# Patient Record
Sex: Male | Born: 1955 | Race: White | Hispanic: No | Marital: Married | Smoking: Former smoker
Health system: Southern US, Community
[De-identification: ages and names within clinical notes are randomized; demographics above are authoritative.]

## PROBLEM LIST (undated history)

## (undated) DIAGNOSIS — L511 Stevens-Johnson syndrome: Secondary | ICD-10-CM

## (undated) DIAGNOSIS — E785 Hyperlipidemia, unspecified: Secondary | ICD-10-CM

## (undated) DIAGNOSIS — E2740 Unspecified adrenocortical insufficiency: Secondary | ICD-10-CM

## (undated) DIAGNOSIS — E663 Overweight: Secondary | ICD-10-CM

## (undated) DIAGNOSIS — K754 Autoimmune hepatitis: Secondary | ICD-10-CM

## (undated) DIAGNOSIS — Z8249 Family history of ischemic heart disease and other diseases of the circulatory system: Secondary | ICD-10-CM

## (undated) DIAGNOSIS — D68312 Antiphospholipid antibody with hemorrhagic disorder: Secondary | ICD-10-CM

## (undated) DIAGNOSIS — K8309 Other cholangitis: Secondary | ICD-10-CM

## (undated) DIAGNOSIS — R6521 Severe sepsis with septic shock: Secondary | ICD-10-CM

## (undated) DIAGNOSIS — I10 Essential (primary) hypertension: Secondary | ICD-10-CM

## (undated) DIAGNOSIS — D704 Cyclic neutropenia: Secondary | ICD-10-CM

## (undated) DIAGNOSIS — K219 Gastro-esophageal reflux disease without esophagitis: Secondary | ICD-10-CM

## (undated) DIAGNOSIS — N529 Male erectile dysfunction, unspecified: Secondary | ICD-10-CM

## (undated) DIAGNOSIS — L0390 Cellulitis, unspecified: Secondary | ICD-10-CM

## (undated) DIAGNOSIS — K8301 Primary sclerosing cholangitis: Secondary | ICD-10-CM

## (undated) DIAGNOSIS — I639 Cerebral infarction, unspecified: Secondary | ICD-10-CM

## (undated) DIAGNOSIS — Z8719 Personal history of other diseases of the digestive system: Secondary | ICD-10-CM

## (undated) DIAGNOSIS — E119 Type 2 diabetes mellitus without complications: Secondary | ICD-10-CM

## (undated) DIAGNOSIS — A4151 Sepsis due to Escherichia coli [E. coli]: Secondary | ICD-10-CM

## (undated) DIAGNOSIS — I823 Embolism and thrombosis of renal vein: Secondary | ICD-10-CM

## (undated) DIAGNOSIS — D6861 Antiphospholipid syndrome: Secondary | ICD-10-CM

## (undated) DIAGNOSIS — E2749 Other adrenocortical insufficiency: Secondary | ICD-10-CM

## (undated) HISTORY — DX: Primary sclerosing cholangitis: K83.01

## (undated) HISTORY — DX: Family history of ischemic heart disease and other diseases of the circulatory system: Z82.49

## (undated) HISTORY — DX: Embolism and thrombosis of renal vein: I82.3

## (undated) HISTORY — DX: Cyclic neutropenia: D70.4

## (undated) HISTORY — DX: Gastro-esophageal reflux disease without esophagitis: K21.9

## (undated) HISTORY — DX: Male erectile dysfunction, unspecified: N52.9

## (undated) HISTORY — PX: ROTATOR CUFF REPAIR: SHX139

## (undated) HISTORY — DX: Unspecified adrenocortical insufficiency: E27.40

## (undated) HISTORY — DX: Essential (primary) hypertension: I10

## (undated) HISTORY — DX: Type 2 diabetes mellitus without complications: E11.9

## (undated) HISTORY — DX: Antiphospholipid antibody with hemorrhagic disorder: D68.312

## (undated) HISTORY — DX: Other adrenocortical insufficiency: E27.49

## (undated) HISTORY — DX: Autoimmune hepatitis: K75.4

## (undated) HISTORY — DX: Personal history of other diseases of the digestive system: Z87.19

## (undated) HISTORY — DX: Cerebral infarction, unspecified: I63.9

## (undated) HISTORY — DX: Severe sepsis with septic shock: A41.51

## (undated) HISTORY — DX: Severe sepsis with septic shock: R65.21

## (undated) HISTORY — PX: ERCP W/ METAL STENT PLACEMENT: SHX1521

## (undated) HISTORY — DX: Other cholangitis: K83.09

## (undated) HISTORY — DX: Antiphospholipid syndrome: D68.61

## (undated) HISTORY — DX: Hyperlipidemia, unspecified: E78.5

## (undated) HISTORY — DX: Overweight: E66.3

---
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
## 2005-05-10 ENCOUNTER — Ambulatory Visit: Payer: Self-pay | Admitting: Family Medicine

## 2005-05-14 ENCOUNTER — Ambulatory Visit (HOSPITAL_COMMUNITY): Admission: RE | Admit: 2005-05-14 | Discharge: 2005-05-14 | Payer: Self-pay | Admitting: Family Medicine

## 2005-06-13 ENCOUNTER — Ambulatory Visit (HOSPITAL_COMMUNITY): Admission: RE | Admit: 2005-06-13 | Discharge: 2005-06-13 | Payer: Self-pay | Admitting: Family Medicine

## 2005-08-23 ENCOUNTER — Ambulatory Visit: Payer: Self-pay | Admitting: Family Medicine

## 2005-08-28 ENCOUNTER — Ambulatory Visit: Payer: Self-pay | Admitting: Oncology

## 2005-09-04 LAB — CBC WITH DIFFERENTIAL (CANCER CENTER ONLY)
BASO#: 0 10*3/uL (ref 0.0–0.2)
Eosinophils Absolute: 0.1 10*3/uL (ref 0.0–0.5)
HCT: 40.7 % (ref 38.7–49.9)
HGB: 14.2 g/dL (ref 13.0–17.1)
LYMPH%: 60.2 % — ABNORMAL HIGH (ref 14.0–48.0)
MCH: 30.2 pg (ref 28.0–33.4)
MONO#: 0.3 10*3/uL (ref 0.1–0.9)
NEUT#: 0.9 10*3/uL — ABNORMAL LOW (ref 1.5–6.5)
NEUT%: 26.8 % — ABNORMAL LOW (ref 40.0–80.0)
RDW: 12.1 % (ref 10.5–14.6)

## 2005-09-04 LAB — URIC ACID: Uric Acid, Serum: 7.3 mg/dL — ABNORMAL HIGH (ref 2.4–7.0)

## 2005-09-04 LAB — MORPHOLOGY - CHCC SATELLITE: PLT EST ~~LOC~~: ADEQUATE

## 2005-09-04 LAB — COMPREHENSIVE METABOLIC PANEL
Albumin: 4 g/dL (ref 3.5–5.2)
BUN: 11 mg/dL (ref 6–23)
CO2: 29 mEq/L (ref 19–32)
Glucose, Bld: 105 mg/dL — ABNORMAL HIGH (ref 70–99)
Sodium: 136 mEq/L (ref 135–145)
Total Bilirubin: 1.1 mg/dL (ref 0.3–1.2)
Total Protein: 6.6 g/dL (ref 6.0–8.3)

## 2005-09-04 LAB — LACTATE DEHYDROGENASE: LDH: 153 U/L (ref 94–250)

## 2005-09-05 ENCOUNTER — Other Ambulatory Visit: Admission: RE | Admit: 2005-09-05 | Discharge: 2005-09-05 | Payer: Self-pay | Admitting: Oncology

## 2005-09-14 LAB — HIV ANTIBODY (ROUTINE TESTING W REFLEX)

## 2005-09-14 LAB — HAPTOGLOBIN: Haptoglobin: 180 mg/dL (ref 16–200)

## 2005-09-14 LAB — IGG, IGA, IGM
IgA: 97 mg/dL (ref 68–378)
IgG (Immunoglobin G), Serum: 1180 mg/dL (ref 694–1618)
IgM, Serum: 65 mg/dL (ref 60–263)

## 2005-09-14 LAB — FLOW CYTOMETRY - CHCC SATELLITE

## 2005-09-28 LAB — CBC WITH DIFFERENTIAL (CANCER CENTER ONLY)
BASO#: 0 10*3/uL (ref 0.0–0.2)
BASO%: 0.9 % (ref 0.0–2.0)
HGB: 13.8 g/dL (ref 13.0–17.1)
MCH: 29.5 pg (ref 28.0–33.4)
MCV: 87 fL (ref 82–98)
MONO%: 8 % (ref 0.0–13.0)
NEUT#: 1.3 10*3/uL — ABNORMAL LOW (ref 1.5–6.5)
NEUT%: 32.9 % — ABNORMAL LOW (ref 40.0–80.0)
Platelets: 220 10*3/uL (ref 145–400)
RBC: 4.68 10*6/uL (ref 4.20–5.70)
RDW: 12 % (ref 10.5–14.6)
WBC: 3.9 10*3/uL — ABNORMAL LOW (ref 4.0–10.0)

## 2005-10-23 ENCOUNTER — Ambulatory Visit: Payer: Self-pay | Admitting: Oncology

## 2005-10-24 LAB — CBC WITH DIFFERENTIAL (CANCER CENTER ONLY)
BASO%: 1.1 % (ref 0.0–2.0)
EOS%: 5.2 % (ref 0.0–7.0)
Eosinophils Absolute: 0.2 10*3/uL (ref 0.0–0.5)
LYMPH%: 56.9 % — ABNORMAL HIGH (ref 14.0–48.0)
MCHC: 33.6 g/dL (ref 32.0–35.9)
MONO%: 6.2 % (ref 0.0–13.0)
NEUT%: 30.6 % — ABNORMAL LOW (ref 40.0–80.0)
Platelets: 228 10*3/uL (ref 145–400)
RBC: 4.17 10*6/uL — ABNORMAL LOW (ref 4.20–5.70)
RDW: 12.5 % (ref 10.5–14.6)

## 2005-11-15 ENCOUNTER — Other Ambulatory Visit: Admission: RE | Admit: 2005-11-15 | Discharge: 2005-11-15 | Payer: Self-pay | Admitting: Oncology

## 2005-11-15 ENCOUNTER — Encounter (INDEPENDENT_AMBULATORY_CARE_PROVIDER_SITE_OTHER): Payer: Self-pay | Admitting: *Deleted

## 2005-11-15 LAB — CBC WITH DIFFERENTIAL (CANCER CENTER ONLY)
EOS%: 3.1 % (ref 0.0–7.0)
Eosinophils Absolute: 0.1 10*3/uL (ref 0.0–0.5)
HCT: 39.4 % (ref 38.7–49.9)
LYMPH%: 56.5 % — ABNORMAL HIGH (ref 14.0–48.0)
MCV: 88 fL (ref 82–98)
RBC: 4.47 10*6/uL (ref 4.20–5.70)
WBC: 3.9 10*3/uL — ABNORMAL LOW (ref 4.0–10.0)

## 2005-12-11 ENCOUNTER — Ambulatory Visit: Payer: Self-pay | Admitting: Oncology

## 2005-12-13 LAB — CBC WITH DIFFERENTIAL (CANCER CENTER ONLY)
BASO#: 0 10*3/uL (ref 0.0–0.2)
BASO%: 0.8 % (ref 0.0–2.0)
Eosinophils Absolute: 0.1 10*3/uL (ref 0.0–0.5)
LYMPH#: 2.2 10*3/uL (ref 0.9–3.3)
MCH: 30.2 pg (ref 28.0–33.4)
MONO#: 0.3 10*3/uL (ref 0.1–0.9)
NEUT%: 34.1 % — ABNORMAL LOW (ref 40.0–80.0)
Platelets: 231 10*3/uL (ref 145–400)

## 2006-06-28 HISTORY — PX: NM MYOCAR PERF WALL MOTION: HXRAD629

## 2006-11-29 ENCOUNTER — Encounter (INDEPENDENT_AMBULATORY_CARE_PROVIDER_SITE_OTHER): Payer: Self-pay | Admitting: Internal Medicine

## 2007-06-14 ENCOUNTER — Ambulatory Visit: Payer: Self-pay | Admitting: Family Medicine

## 2007-06-14 DIAGNOSIS — T7840XA Allergy, unspecified, initial encounter: Secondary | ICD-10-CM | POA: Insufficient documentation

## 2007-08-13 ENCOUNTER — Encounter (INDEPENDENT_AMBULATORY_CARE_PROVIDER_SITE_OTHER): Payer: Self-pay | Admitting: Internal Medicine

## 2007-11-03 ENCOUNTER — Encounter (INDEPENDENT_AMBULATORY_CARE_PROVIDER_SITE_OTHER): Payer: Self-pay | Admitting: Internal Medicine

## 2009-12-13 ENCOUNTER — Other Ambulatory Visit: Payer: Self-pay | Admitting: Internal Medicine

## 2011-11-27 HISTORY — PX: OTHER SURGICAL HISTORY: SHX169

## 2012-10-22 ENCOUNTER — Ambulatory Visit (INDEPENDENT_AMBULATORY_CARE_PROVIDER_SITE_OTHER): Payer: BC Managed Care – PPO | Admitting: Family Medicine

## 2012-10-22 VITALS — BP 143/92 | HR 105 | Temp 98.3°F | Resp 16 | Ht 69.0 in | Wt 200.0 lb

## 2012-10-22 DIAGNOSIS — J029 Acute pharyngitis, unspecified: Secondary | ICD-10-CM

## 2012-10-22 DIAGNOSIS — J019 Acute sinusitis, unspecified: Secondary | ICD-10-CM

## 2012-10-22 DIAGNOSIS — R6883 Chills (without fever): Secondary | ICD-10-CM

## 2012-10-22 LAB — POCT CBC
Granulocyte percent: 86.2 % — AB (ref 37–80)
HCT, POC: 43.7 % (ref 43.5–53.7)
Hemoglobin: 14.3 g/dL (ref 14.1–18.1)
Lymph, poc: 0.8 (ref 0.6–3.4)
MCH, POC: 30.7 pg (ref 27–31.2)
MCHC: 32.7 g/dL (ref 31.8–35.4)
MCV: 93.7 fL (ref 80–97)
MID (cbc): 0.2 (ref 0–0.9)
MPV: 7.4 fL (ref 0–99.8)
POC Granulocyte: 6.4 (ref 2–6.9)
POC LYMPH PERCENT: 11 % (ref 10–50)
POC MID %: 2.8 %M (ref 0–12)
Platelet Count, POC: 193 10*3/uL (ref 142–424)
RBC: 4.66 M/uL — AB (ref 4.69–6.13)
RDW, POC: 13.9 %
WBC: 7.4 10*3/uL (ref 4.6–10.2)

## 2012-10-22 LAB — POCT RAPID STREP A (OFFICE): Rapid Strep A Screen: NEGATIVE

## 2012-10-22 MED ORDER — DIPHENHYD-HYDROCORT-NYSTATIN MT SUSP: 5.0000 mL | Freq: Four times a day (QID) | OROMUCOSAL | Status: DC | PRN

## 2012-10-22 MED ORDER — METHYLPREDNISOLONE 4 MG PO KIT: PACK | ORAL | Status: DC

## 2012-10-22 MED ORDER — LEVOFLOXACIN 500 MG PO TABS: 500.0000 mg | ORAL_TABLET | Freq: Every day | ORAL | Status: DC

## 2012-10-22 MED ORDER — METHYLPREDNISOLONE ACETATE 80 MG/ML IJ SUSP
120.0000 mg | Freq: Once | INTRAMUSCULAR | Status: AC
  Administered 2012-10-22: 120 mg via INTRAMUSCULAR

## 2012-10-22 NOTE — Progress Notes (Signed)
[facility]:  Office Visit  Chief Complaint:  Chief Complaint  Patient presents with  . Mouth Lesions    Started today-hard to swallow-breathing fine-just started Clarithrromycin today    HPI: Kenneth Cline is a 57 y.o. male who complains of  1 week hsitory of worsening sinus infection and was given clarithromycin 500 mg 2 tabs for sinus infection and started having problems with throat swelling today after taking clarithromycin.He was given Clarithromycin because he has PCN allergy.  He is not having problems breathing right now, is able to talk, he has no wheezing. He has throat pain and pain with swallowing but no SOB.  No rashes. Doe snot feel flushed. No prior h/o angiodema/throat swelling with ACEI  Past Medical History  Diagnosis Date  . Hypertension   . GERD (gastroesophageal reflux disease)    History reviewed. No pertinent past surgical history. History   Social History  . Marital Status: Married    Spouse Name: [name]    Number of Children: [name]  . Years of Education: [name]   Social History Main Topics  . Smoking status: Never Smoker   . Smokeless tobacco: None  . Alcohol Use: 1.5 oz/week    3 drink(s) per week  . Drug Use: No  . Sexually Active: None   Other Topics Concern  . None   Social History Narrative  . None   Family History  Problem Relation Age of Onset  . Heart attack Father    Allergies  Allergen Reactions  . Penicillins     REACTION: rash   Prior to Admission medications   Medication Sig Start Date End Date Taking? Authorizing Provider  clarithromycin (BIAXIN) 500 MG tablet Take 1,000 mg by mouth daily.   Yes Historical Provider, MD  ezetimibe (ZETIA) 10 MG tablet Take 10 mg by mouth daily.   Yes Historical Provider, MD  lisinopril-hydrochlorothiazide (PRINZIDE,ZESTORETIC) 20-12.5 MG per tablet Take 1 tablet by mouth daily.   Yes Historical Provider, MD  pantoprazole (PROTONIX) 40 MG tablet Take 40 mg by mouth daily.   Yes  Historical Provider, MD     ROS: The patient denies fevers, night sweats, unintentional weight loss, chest pain, palpitations, wheezing, dyspnea on exertion, nausea, vomiting, abdominal pain, dysuria, hematuria, melena, numbness, weakness, or tingling.   All other systems have been reviewed and were otherwise negative with the exception of those mentioned in the HPI and as above.    PHYSICAL EXAM: Filed Vitals:   10/22/12 2032  BP: 143/92  Pulse: 111  Temp: 98.3 F (36.8 C)  Resp: 16  SPo2   98%   Filed Vitals:   10/22/12 2032  Height: 5\' 9"  (1.753 m)  Weight: 200 lb (90.719 kg)   Body mass index is 29.52 kg/(m^2).  General: Alert, no acute distress HEENT:  Normocephalic, atraumatic, oropharynx patent. Erythematous throat and no exudates. Tm nl.  Cardiovascular:  Regular rate and rhythm, no rubs murmurs or gallops.  No Carotid bruits, radial pulse intact. No pedal edema.  Respiratory: Clear to auscultation bilaterally.  No wheezes, rales, or rhonchi.  No cyanosis, no use of accessory musculature GI: No organomegaly, abdomen is soft and non-tender, positive bowel sounds.  No masses. Skin: No rashes. Neurologic: Facial musculature symmetric. Psychiatric: Patient is appropriate throughout our interaction. Lymphatic: No cervical lymphadenopathy Musculoskeletal: Gait intact.   LABS: Results for orders placed in visit on 10/22/12  POCT CBC      Result Value Range   WBC 7.4  4.6 - 10.2 K/uL   Lymph, poc 0.8  0.6 - 3.4   POC LYMPH PERCENT 11.0  10 - 50 %L   MID (cbc) 0.2  0 - 0.9   POC MID % 2.8  0 - 12 %M   POC Granulocyte 6.4  2 - 6.9   Granulocyte percent 86.2 (*) 37 - 80 %G   RBC 4.66 (*) 4.69 - 6.13 M/uL   Hemoglobin 14.3  14.1 - 18.1 g/dL   HCT, POC 16.1  09.6 - 53.7 %   MCV 93.7  80 - 97 fL   MCH, POC 30.7  27 - 31.2 pg   MCHC 32.7  31.8 - 35.4 g/dL   RDW, POC 04.5     Platelet Count, POC 193  142 - 424 K/uL   MPV 7.4  0 - 99.8 fL  POCT RAPID STREP A (OFFICE)       Result Value Range   Rapid Strep A Screen Negative  Negative     EKG/XRAY:   Primary read interpreted by Dr. Conley Rolls at [facility].   ASSESSMENT/PLAN: Encounter Diagnoses  Name Primary?  . Acute sinusitis Yes  . Chills   . Acute pharyngitis    Acute sinusitis and pharyngitis. Tonsils are normal size ? Possible drug reaction to clarithromycin so will dc  I do not suspect it is his lisinopril No current SOB, wheezing Advise to monitor for sxs. Go to ER prn Patient given IM Depomedrol 120 mg in office Rx Medrol dose pack, Levaquin and magic mouthwash F/u in the AM by phone   Kenneth Cline [name], DO 10/22/2012 9:01 PM  10/23/12--Patient called and he is dong better.

## 2012-10-25 LAB — CULTURE, GROUP A STREP: Organism ID, Bacteria: NORMAL

## 2012-10-26 ENCOUNTER — Telehealth: Payer: Self-pay | Admitting: Family Medicine

## 2012-10-26 DIAGNOSIS — B49 Unspecified mycosis: Secondary | ICD-10-CM

## 2012-10-26 MED ORDER — NYSTATIN 100000 UNIT/GM EX CREA: TOPICAL_CREAM | Freq: Two times a day (BID) | CUTANEOUS | Status: DC

## 2012-10-26 NOTE — Telephone Encounter (Signed)
Returned call , he may have what sounds like a fungal infection vs aftereaffects of Biaxin drug reaction, will do trial of nystatin cream. Will try a trial of nystating, if no improvemen then return to office.

## 2012-10-27 ENCOUNTER — Ambulatory Visit (INDEPENDENT_AMBULATORY_CARE_PROVIDER_SITE_OTHER): Payer: BC Managed Care – PPO | Admitting: Family Medicine

## 2012-10-27 VITALS — BP 128/74 | Temp 98.0°F | Resp 18 | Ht 69.0 in | Wt 200.0 lb

## 2012-10-27 DIAGNOSIS — B379 Candidiasis, unspecified: Secondary | ICD-10-CM

## 2012-10-27 DIAGNOSIS — K5900 Constipation, unspecified: Secondary | ICD-10-CM

## 2012-10-27 DIAGNOSIS — N50819 Testicular pain, unspecified: Secondary | ICD-10-CM

## 2012-10-27 DIAGNOSIS — N509 Disorder of male genital organs, unspecified: Secondary | ICD-10-CM

## 2012-10-27 DIAGNOSIS — K120 Recurrent oral aphthae: Secondary | ICD-10-CM

## 2012-10-27 LAB — POCT URINALYSIS DIPSTICK
Bilirubin, UA: NEGATIVE
Blood, UA: NEGATIVE
Glucose, UA: NEGATIVE
Ketones, UA: NEGATIVE
Leukocytes, UA: NEGATIVE
Nitrite, UA: NEGATIVE
Protein, UA: NEGATIVE
Spec Grav, UA: 1.02
Urobilinogen, UA: 1
pH, UA: 7

## 2012-10-27 LAB — POCT UA - MICROSCOPIC ONLY
Bacteria, U Microscopic: NEGATIVE
Casts, Ur, LPF, POC: NEGATIVE
Crystals, Ur, HPF, POC: NEGATIVE
Mucus, UA: NEGATIVE
Yeast, UA: NEGATIVE

## 2012-10-27 MED ORDER — VALACYCLOVIR HCL 1 G PO TABS: ORAL_TABLET | ORAL | Status: DC

## 2012-10-27 MED ORDER — PRAMOXINE-ZINC OXIDE IN MO 1-12.5 % RE OINT: TOPICAL_OINTMENT | RECTAL | Status: DC | PRN

## 2012-10-27 MED ORDER — FLUCONAZOLE 150 MG PO TABS: 150.0000 mg | ORAL_TABLET | Freq: Once | ORAL | Status: DC

## 2012-10-27 NOTE — Patient Instructions (Addendum)
Constipation, Adult Constipation is when a person has fewer than 3 bowel movements a week; has difficulty having a bowel movement; or has stools that are dry, hard, or larger than normal. As people grow older, constipation is more common. If you try to fix constipation with medicines that make you have a bowel movement (laxatives), the problem may get worse. Long-term laxative use may cause the muscles of the colon to become weak. A low-fiber diet, not taking in enough fluids, and taking certain medicines may make constipation worse. CAUSES   Certain medicines, such as antidepressants, pain medicine, iron supplements, antacids, and water pills.   Certain diseases, such as diabetes, irritable bowel syndrome (IBS), thyroid disease, or depression.   Not drinking enough water.   Not eating enough fiber-rich foods.   Stress or travel.  Lack of physical activity or exercise.  Not going to the restroom when there is the urge to have a bowel movement.  Ignoring the urge to have a bowel movement.  Using laxatives too much. SYMPTOMS   Having fewer than 3 bowel movements a week.   Straining to have a bowel movement.   Having hard, dry, or larger than normal stools.   Feeling full or bloated.   Pain in the lower abdomen.  Not feeling relief after having a bowel movement. DIAGNOSIS  Your caregiver will take a medical history and perform a physical exam. Further testing may be done for severe constipation. Some tests may include:   A barium enema X-ray to examine your rectum, colon, and sometimes, your small intestine.  A sigmoidoscopy to examine your lower colon.  A colonoscopy to examine your entire colon. TREATMENT  Treatment will depend on the severity of your constipation and what is causing it. Some dietary treatments include drinking more fluids and eating more fiber-rich foods. Lifestyle treatments may include regular exercise. If these diet and lifestyle recommendations  do not help, your caregiver may recommend taking over-the-counter laxative medicines to help you have bowel movements. Prescription medicines may be prescribed if over-the-counter medicines do not work.  HOME CARE INSTRUCTIONS   Increase dietary fiber in your diet, such as fruits, vegetables, whole grains, and beans. Limit high-fat and processed sugars in your diet, such as [place] fries, hamburgers, cookies, candies, and soda.   A fiber supplement may be added to your diet if you cannot get enough fiber from foods.   Drink enough fluids to keep your urine clear or pale yellow.   Exercise regularly or as directed by your caregiver.   Go to the restroom when you have the urge to go. Do not hold it.  Only take medicines as directed by your caregiver. Do not take other medicines for constipation without talking to your caregiver first. SEEK IMMEDIATE MEDICAL CARE IF:   You have bright red blood in your stool.   Your constipation lasts for more than 4 days or gets worse.   You have abdominal or rectal pain.   You have thin, pencil-like stools.  You have unexplained weight loss. MAKE SURE YOU:   Understand these instructions.  Will watch your condition.  Will get help right away if you are not doing well or get worse. Document Released: 01/07/2004 Document Revised: 07/03/2011 Document Reviewed: 03/14/2011 [facility] Patient Information 2014 [place], [place]. Cold Sore A cold sore (fever blister) is a skin infection caused by the herpes simplex virus (HSV-1). HSV-1 is closely related to the virus that causes gential herpes (HSV-2), but they are not the same  even though both viruses can cause oral and genital infections. Cold sores are small, fluid-filled sores inside of the mouth or on the lips, gums, nose, chin, cheeks, or fingers.  The herpes simplex virus can be easily passed (contagious) to other people through close personal contact, such as kissing or sharing personal items.  The virus can also spread to other parts of the body, such as the eyes or genitals. Cold sores are contagious until the sores crust over completely. They often heal within 2 weeks.  Once a person is infected, the herpes simplex virus remains permanently in the body. Therefore, there is no cure for cold sores, and they often recur when a person is tired, stressed, sick, or gets too much sun. Additional factors that can cause a recurrence include hormone changes in menstruation or pregnancy, certain drugs, and cold weather.  CAUSES  Cold sores are caused by the herpes simplex virus. The virus is spread from person to person through close contact, such as through kissing, touching the affected area, or sharing personal items such as lip balm, razors, or eating utensils.  SYMPTOMS  The first infection may not cause symptoms. If symptoms develop, the symptoms often go through different stages. Here is how a cold sore develops:   Tingling, itching, or burning is felt 1 2 days before the outbreak.   Fluid-filled blisters appear on the lips, inside the mouth, nose, or on the cheeks.   The blisters start to ooze clear fluid.   The blisters dry up and a yellow crust appears in its place.   The crust falls off.  Symptoms depend on whether it is the initial outbreak or a recurrence. Some other symptoms with the first outbreak may include:   Fever.   Sore throat.   Headache.   Muscle aches.   Swollen neck glands.  DIAGNOSIS  A diagnosis is often made based on your symptoms and looking at the sores. Sometimes, a sore may be swabbed and then examined in the lab to make a final diagnosis. If the sores are not present, blood tests can find the herpes simplex virus.  TREATMENT  There is no cure for cold sores and no vaccine for the herpes simplex virus. Within 2 weeks, most cold sores go away on their own without treatment. Medicines cannot make the infection go away, but medicine can help  relieve some of the pain associated with the sores, can work to stop the virus from multiplying, and can also shorten healing time. Medicine may be in the form of creams, gels, pills, or a shot.  HOME CARE INSTRUCTIONS   Only take over-the-counter or prescription medicines for pain, discomfort, or fever as directed by your caregiver. Do not use aspirin.   Use a cotton-tip swab to apply creams or gels to your sores.   Do not touch the sores or pick the scabs. Wash your hands often. Do not touch your eyes without washing your hands first.   Avoid kissing, oral sex, and sharing personal items until sores heal.   Apply an ice pack on your sores for 10 15 minutes to ease any discomfort.   Avoid hot, cold, or salty foods because they may hurt your mouth. Eat a soft, bland diet to avoid irritating the sores. Use a straw to drink if you have pain when drinking out of a glass.   Keep sores clean and dry to prevent an infection of other tissues.   Avoid the sun and limit stress if  these things trigger outbreaks. If sun causes cold sores, apply sunscreen on the lips before being out in the sun.  SEEK MEDICAL CARE IF:   You have a fever or persistent symptoms for more than 2 3 days.   You have a fever and your symptoms suddenly get worse.   You have pus, not clear fluid, coming from the sores.   You have redness that is spreading.   You have pain or irritation in your eye.   You get sores on your genitals.   Your sores do not heal within 2 weeks.   You have a weakened immune system.   You have frequent recurrences of cold sores.  MAKE SURE YOU:   Understand these instructions.  Will watch your condition.  Will get help right away if you are not doing well or get worse. Document Released: 04/07/2000 Document Revised: 01/03/2012 Document Reviewed: 08/23/2011 [facility] Patient Information 2014 [place], [place].

## 2012-10-27 NOTE — Progress Notes (Signed)
[facility]:  Office Visit  Chief Complaint:  Chief Complaint  Patient presents with  . rash on groin area    since beginning antibiotic on 10/22/12 for acute sinusitis    HPI: Kenneth Cline is a 57 y.o. male who complains of  Having ulcers in his mouth and also irritation on his scrotum and genital area. He has had ulcers in his mouth before but never this bad. He denies fevers or chills or LAD. He denies urinary sxs. The skin on his scrotum feels like it has a lot of heat and burning to it. No masses, there is pain but only when he touches it against anything. He tried putting Destin on it without relief . He tried monistat and that did seem to help. He tried nystatin and that worked but got painful when it dried. He also complains of constipation and he has had more pain in his rectum when he goes to the bathrrom, he has had to strain. He has had this before but all of these sxs are hitting him at once,  Of pertinent interest, [name] about 5 days ago with sever acute sinusitis sxs and aalso throat swelling and pain and difficulty swallowing. He laso was red and aflushed and was previosuly seen by another [facility] and 4 hrs after he had taken the calrithromycin 500 mg BID he started having sxs. He had take z packs before but it never caused him sxs, he came in to see [place]. I was afraid it was a drug reaction and also his throat was very red and he had pain with swallowing. So he was given Depomedrol 120 mg x 1 and then also a steroid taper to take the next day, we changed his abx to Levaquin and dc his Biaxin.   As of today his URI sxs are better but as a consequence of the high dose of steroids, his URI he now has some thrush and also HSV 1  Canker sores/blisters.   Past Medical History  Diagnosis Date  . Hypertension   . GERD (gastroesophageal reflux disease)    History reviewed. No pertinent past surgical history. History   Social History  . Marital  Status: Married    Spouse Name: [name]    Number of Children: [name]  . Years of Education: [name]   Social History Main Topics  . Smoking status: Never Smoker   . Smokeless tobacco: None  . Alcohol Use: 1.5 oz/week    3 drink(s) per week  . Drug Use: No  . Sexually Active: None   Other Topics Concern  . None   Social History Narrative  . None   Family History  Problem Relation Age of Onset  . Heart attack Father    Allergies  Allergen Reactions  . Penicillins     REACTION: rash   Prior to Admission medications   Medication Sig Start Date End Date Taking? Authorizing Provider  Diphenhyd-Hydrocort-Nystatin SUSP Use as directed 5 mLs in the mouth or throat 4 (four) times daily as needed. 10/22/12  Yes Clarissia Mckeen P Rionna Feltes, DO  ezetimibe (ZETIA) 10 MG tablet Take 10 mg by mouth daily.   Yes Historical Provider, MD  levofloxacin (LEVAQUIN) 500 MG tablet Take 1 tablet (500 mg total) by mouth daily. 10/22/12  Yes Rollo Farquhar P Norie Latendresse, DO  lisinopril-hydrochlorothiazide (PRINZIDE,ZESTORETIC) 20-12.5 MG per tablet Take 1 tablet by mouth daily.   Yes Historical Provider, MD  methylPREDNISolone (MEDROL, PAK,) 4  MG tablet follow package directions 10/22/12  Yes Myrle Dues P [place] Mall, DO  pantoprazole (PROTONIX) 40 MG tablet Take 40 mg by mouth daily.   Yes Historical Provider, MD  nystatin cream (MYCOSTATIN) Apply topically 2 (two) times daily. 10/26/12   Dajiah Kooi P Carinna Newhart, DO     ROS: The patient denies fevers, chills, night sweats, unintentional weight loss, chest pain, palpitations, wheezing, dyspnea on exertion, nausea, vomiting, abdominal pain, dysuria, hematuria, melena, numbness, weakness, or tingling.   All other systems have been reviewed and were otherwise negative with the exception of those mentioned in the HPI and as above.    PHYSICAL EXAM: Filed Vitals:   10/27/12 1024  BP: 128/74  Temp: 98 F (36.7 C)  Resp: 18   Filed Vitals:   10/27/12 1024  Height: 5\' 9"  (1.753 m)  Weight: 200 lb (90.719 kg)   Body mass index  is 29.52 kg/(m^2).  General: Alert, no acute distress HEENT:  Normocephalic, atraumatic, oropharynx patent. + canker sores in mouth, + minimal thrush Cardiovascular:  Regular rate and rhythm, no rubs murmurs or gallops.  No Carotid bruits, radial pulse intact. No pedal edema.  Respiratory: Clear to auscultation bilaterally.  No wheezes, rales, or rhonchi.  No cyanosis, no use of accessory musculature GI: No organomegaly, abdomen is soft and non-tender, positive bowel sounds.  No masses. Skin: No rashes. Neurologic: Facial musculature symmetric. Psychiatric: Patient is appropriate throughout our interaction. Lymphatic: No cervical lymphadenopathy Musculoskeletal: Gait intact. Gu-scrotum slightly   LABS: Results for orders placed in visit on 10/27/12  POCT UA - MICROSCOPIC ONLY      Result Value Range   WBC, Ur, HPF, POC 0-1     RBC, urine, microscopic 0-1     Bacteria, U Microscopic neg     Mucus, UA neg     Epithelial cells, urine per micros 0-2     Crystals, Ur, HPF, POC neg     Casts, Ur, LPF, POC neg     Yeast, UA neg    POCT URINALYSIS DIPSTICK      Result Value Range   Color, UA yellow     Clarity, UA clear     Glucose, UA neg     Bilirubin, UA neg     Ketones, UA neg     Spec Grav, UA 1.020     Blood, UA neg     pH, UA 7.0     Protein, UA neg     Urobilinogen, UA 1.0     Nitrite, UA neg     Leukocytes, UA Negative       EKG/XRAY:   Primary read interpreted by Dr. Conley Rolls at [facility].   ASSESSMENT/PLAN: Encounter Diagnoses  Name Primary?  . Testicle pain Yes  . Canker sores oral   . Unspecified constipation   . Candidiasis     Rx Diflucan Rx Valtrex May use AD ointment to keep scrotum lubrucated so does not have pain He has some rectal abrasion from straining, advise to use anusol and also constipation otc meds to prevent hemorrhoids and straining Rx anusol F/u prn   [name], DO 10/27/2012 1:27 PM

## 2012-11-24 ENCOUNTER — Encounter: Payer: Self-pay | Admitting: *Deleted

## 2012-11-25 ENCOUNTER — Encounter: Payer: Self-pay | Admitting: Cardiology

## 2012-11-25 ENCOUNTER — Ambulatory Visit (INDEPENDENT_AMBULATORY_CARE_PROVIDER_SITE_OTHER): Payer: BC Managed Care – PPO | Admitting: Cardiology

## 2012-11-25 VITALS — BP 140/88 | HR 72 | Ht 70.0 in | Wt 196.7 lb

## 2012-11-25 DIAGNOSIS — Z8249 Family history of ischemic heart disease and other diseases of the circulatory system: Secondary | ICD-10-CM

## 2012-11-25 DIAGNOSIS — E663 Overweight: Secondary | ICD-10-CM

## 2012-11-25 DIAGNOSIS — N529 Male erectile dysfunction, unspecified: Secondary | ICD-10-CM

## 2012-11-25 DIAGNOSIS — E7800 Pure hypercholesterolemia, unspecified: Secondary | ICD-10-CM

## 2012-11-25 DIAGNOSIS — E785 Hyperlipidemia, unspecified: Secondary | ICD-10-CM

## 2012-11-25 DIAGNOSIS — Z6825 Body mass index (BMI) 25.0-25.9, adult: Secondary | ICD-10-CM

## 2012-11-25 DIAGNOSIS — E8881 Metabolic syndrome: Secondary | ICD-10-CM

## 2012-11-25 DIAGNOSIS — I10 Essential (primary) hypertension: Secondary | ICD-10-CM

## 2012-11-25 MED ORDER — EZETIMIBE 10 MG PO TABS: 10.0000 mg | ORAL_TABLET | Freq: Every day | ORAL | Status: DC

## 2012-11-25 MED ORDER — PANTOPRAZOLE SODIUM 40 MG PO TBEC: 40.0000 mg | DELAYED_RELEASE_TABLET | Freq: Every day | ORAL | Status: DC

## 2012-11-25 MED ORDER — LISINOPRIL-HYDROCHLOROTHIAZIDE 20-12.5 MG PO TABS: 1.0000 | ORAL_TABLET | Freq: Every day | ORAL | Status: DC

## 2012-11-25 MED ORDER — SILDENAFIL CITRATE 100 MG PO TABS: 100.0000 mg | ORAL_TABLET | Freq: Every day | ORAL | Status: DC | PRN

## 2012-11-25 NOTE — Patient Instructions (Addendum)
You are doing well.  Your stress test was reassuring, but not "perfect" -= B+!  You need to work on the exercise prescription that I gave you.  I am refilling your medications.  I will see you back in 12 months.  We will recheck your test after that.  Marykay Lex, MD

## 2012-12-14 ENCOUNTER — Encounter: Payer: Self-pay | Admitting: Cardiology

## 2012-12-14 DIAGNOSIS — N529 Male erectile dysfunction, unspecified: Secondary | ICD-10-CM | POA: Insufficient documentation

## 2012-12-14 DIAGNOSIS — I10 Essential (primary) hypertension: Secondary | ICD-10-CM | POA: Insufficient documentation

## 2012-12-14 DIAGNOSIS — E663 Overweight: Secondary | ICD-10-CM | POA: Insufficient documentation

## 2012-12-14 DIAGNOSIS — Z8249 Family history of ischemic heart disease and other diseases of the circulatory system: Secondary | ICD-10-CM | POA: Insufficient documentation

## 2012-12-14 DIAGNOSIS — E785 Hyperlipidemia, unspecified: Secondary | ICD-10-CM | POA: Insufficient documentation

## 2012-12-14 NOTE — Assessment & Plan Note (Deleted)
Pretty well controlled on the ACE inhibitor HCTZ combination.  Would continue to monitor to see if this does need to be increased.  I prefer not to add another medication at this time. 

## 2012-12-14 NOTE — Assessment & Plan Note (Signed)
He is on Zetia.  His lipids are being followed by his primary physician.  May want to consider statin if not at goal.  I would suggest that a good goal for his LDL is less than 130.

## 2012-12-14 NOTE — Progress Notes (Signed)
Patient ID: Kenneth Cline, male   DOB: [birth year], 57 y.o.   MRN: [id number] PCP: Tomi Bamberger, NP  Clinic Note: Chief Complaint  Patient presents with  . Follow-up    Dyslipidemia, hypertension and obesity   HPI: Kenneth Cline is a 57 y.o. male with a PMH below who presents today for annual followup.  I last saw him I ordered a CPET-MET test that he get relatively well on as indicated below.  This goes along with how well he did on his treadmill nuclear stress test in 2008. He essentially is seeing a cardiologist due to extensive Family History of Premature CAD as follows:  Father: Died at age 57 of MI with sudden cardiac arrest.  Brother: CABG at age 61, now 51.  Brother (twin sister with CABG): Died following MI with cardiac arrest at age 57  Sister: CABG in her 40s.  Diet CHF after bowel surgery.  Second Sister: History of arrhythmias and valve disease  Paternal grandmother died a couple cases of childbirth related CHF (peripartum cardiomyopathy)  Interval History: He doing well today.  No active cardiac symptoms to speak of.  He is not as active as he would like to be.  But with the exertion that he does do, he denies any chest pain or shortness of breath.  He is up and down steps and does a lot walking or at work.  He denies any PND, orthopnea or edema.  No lightheadedness, dizziness, wooziness or syncope/ near-syncope.  No palpitations or rapid heart rates.  No claudication symptoms.  No TIA or amaurosis fugax symptoms.  Past Medical History  Diagnosis Date  . Hypertension   . GERD (gastroesophageal reflux disease)   . Dyslipidemia   . Overweight (BMI 25.0-29.9)   . Erectile dysfunction   . Family history of premature coronary artery disease     2 brothers, one sister and father all with CAD.  One sister with valve disease.   Prior Cardiac Evaluation and Past Surgical History: Past Surgical History  Procedure Laterality Date  . Cpet/met  11/27/2011    normal PFT,  good effort-no ischemia burden  . Nm myocar perf wall motion  06/28/2006    EF 70% , LV systolic fx norm.   Allergies  Allergen Reactions  . Penicillins     REACTION: rash   Current Outpatient Prescriptions  Medication Sig Dispense Refill  . aspirin EC 81 MG tablet Take 81 mg by mouth daily.      . cetirizine (ZYRTEC) 10 MG tablet Take 10 mg by mouth daily.      . Diphenhyd-Hydrocort-Nystatin SUSP Use as directed 5 mLs in the mouth or throat 4 (four) times daily as needed.  120 mL  0  . ezetimibe (ZETIA) 10 MG tablet Take 1 tablet (10 mg total) by mouth daily.  90 tablet  3  . fish oil-omega-3 fatty acids 1000 MG capsule       . fluconazole (DIFLUCAN) 150 MG tablet Take 1 tablet (150 mg total) by mouth once. May repeat  2 tablet  0  . lisinopril-hydrochlorothiazide (PRINZIDE,ZESTORETIC) 20-12.5 MG per tablet Take 1 tablet by mouth daily.  90 tablet  3  . pantoprazole (PROTONIX) 40 MG tablet Take 1 tablet (40 mg total) by mouth daily.  90 tablet  3  . sildenafil (VIAGRA) 100 MG tablet Take 1 tablet (100 mg total) by mouth daily as needed for erectile dysfunction.  12 tablet  11   No current facility-administered medications  for this visit.   History   Social History Narrative   He is a married father of 2 with 3 stepchildren.  He has 3 grandchildren his own and 3 step grandchildren.  He works as a Curator for [employer].  He lives with his wife, [name], of 8 years.  He does not, and never did smoke.  He takes occasional alcohol beverage but nothing significant.  He does not do routine exercise, but does walk a lot at work.   ROS: A comprehensive Review of Systems - Negative with the exception of noting he does have erectile dysfunction for which he uses sildenafil.  He says it works "okay".  Otherwise he is essentially doing well with no complaints.  PHYSICAL EXAM BP 140/88  Pulse 72  Ht 5\' 10"  (1.778 m)  Wt 196 lb 11.2 oz (89.223 kg)  BMI 28.22 kg/m2 General appearance: alert,  cooperative, appears stated age, no distress and Well-nourished and well-groomed.  Answers questions appropriately. Neck: no adenopathy, no carotid bruit, no JVD and supple, symmetrical, trachea midline Lungs: clear to auscultation bilaterally, normal percussion bilaterally and Nonlabored, good air movement Heart: regular rate and rhythm, S1, S2 normal, no murmur, click, rub or gallop and normal apical impulse Abdomen: soft, non-tender; bowel sounds normal; no masses,  no organomegaly Extremities: extremities normal, atraumatic, no cyanosis or edema and no ulcers, gangrene or trophic changes Pulses: 2+ and symmetric Neurologic: Alert and oriented X 3, normal strength and tone. Normal symmetric reflexes. Normal coordination and gait HEENT: [place]/AT, EOMI, MMM, anicteric sclera  [id number] today: Yes Rate:72 , Rhythm: Normal Sinus Rhythm, Normal ECG;   Recent Labs: None  ASSESSMENT / PLAN: Family history of premature coronary artery disease He has a very extensive family history as I described.  Thankfully he is doing quite well very healthy without any major problems.  He has done very well on a nuclear stress test in the past as well as the CPET-MET test last year.  Both these to her predicted negative prognostic indicators for him.    Significant in his history, will continue to see him on an annual basis with followup CPET-MET tests on an intermittent basis.  Continue to work on cardiac respect or modification.  Hypertension, essential, benign Pretty well controlled on the ACE inhibitor HCTZ combination.  Would continue to monitor to see if this does need to be increased.  I prefer not to add another medication at this time.  Dyslipidemia He is on Zetia.  His lipids are being followed by his primary physician.  May want to consider statin if not at goal.  I would suggest that a good goal for his LDL is less than 130.  Erectile dysfunction Treated with Viagra.  This is actually  perhaps the first indicator of the existing vascular disease.  Can almost be considered peripheral vascular disease, which would therefore lead to more intense risk factor modification for CAD.  Overweight (BMI 25.0-29.9) His weight has been stable now for couple years.  I talked about trying to pick up his active exercise level.  He did so well on the stress test as far as lasting on exertion standpoint but I think he probably could bring his weight down as well as his blood pressure and lipids considerably with simply on some dietary modification with exercise.  His CPET stress test results was reassuring, but not "perfect" -= B+! I gave him the exercise prescription that's provided with the CPET test results.   Orders Placed This  Encounter  Procedures  . EKG 12-Lead   Meds refilled this encounter  Medications  . ezetimibe (ZETIA) 10 MG tablet    Sig: Take 1 tablet (10 mg total) by mouth daily.    Dispense:  90 tablet    Refill:  3  . lisinopril-hydrochlorothiazide (PRINZIDE,ZESTORETIC) 20-12.5 MG per tablet    Sig: Take 1 tablet by mouth daily.    Dispense:  90 tablet    Refill:  3  . pantoprazole (PROTONIX) 40 MG tablet    Sig: Take 1 tablet (40 mg total) by mouth daily.    Dispense:  90 tablet    Refill:  3  . sildenafil (VIAGRA) 100 MG tablet    Sig: Take 1 tablet (100 mg total) by mouth daily as needed for erectile dysfunction.    Dispense:  12 tablet    Refill:  11   Followup: One year  Daquawn Seelman W. Herbie Baltimore, M.D., M.S. [facility] [address] [place], [place]  [address]  [phone] Pager # [phone]

## 2012-12-14 NOTE — Assessment & Plan Note (Addendum)
He has a very extensive family history as I described.  Thankfully he is doing quite well very healthy without any major problems.  He has done very well on a nuclear stress test in the past as well as the CPET-MET test last year.  Both these to her predicted negative prognostic indicators for him.    Significant in his history, will continue to see him on an annual basis with followup CPET-MET tests on an intermittent basis.  Continue to work on cardiac respect or modification.

## 2012-12-14 NOTE — Assessment & Plan Note (Signed)
Treated with Viagra.  This is actually perhaps the first indicator of the existing vascular disease.  Can almost be considered peripheral vascular disease, which would therefore lead to more intense risk factor modification for CAD.

## 2012-12-14 NOTE — Assessment & Plan Note (Addendum)
His weight has been stable now for couple years.  I talked about trying to pick up his active exercise level.  He did so well on the stress test as far as lasting on exertion standpoint but I think he probably could bring his weight down as well as his blood pressure and lipids considerably with simply on some dietary modification with exercise.  His CPET stress test results was reassuring, but not "perfect" -= B+! I gave him the exercise prescription that's provided with the CPET test results.

## 2012-12-14 NOTE — Assessment & Plan Note (Signed)
Pretty well controlled on the ACE inhibitor HCTZ combination.  Would continue to monitor to see if this does need to be increased.  I prefer not to add another medication at this time.

## 2013-02-05 ENCOUNTER — Other Ambulatory Visit: Payer: Self-pay | Admitting: Cardiology

## 2013-04-06 ENCOUNTER — Ambulatory Visit (INDEPENDENT_AMBULATORY_CARE_PROVIDER_SITE_OTHER): Payer: BC Managed Care – PPO | Admitting: Emergency Medicine

## 2013-04-06 VITALS — BP 98/64 | HR 118 | Temp 98.6°F | Resp 16 | Ht 69.5 in | Wt 200.2 lb

## 2013-04-06 DIAGNOSIS — J018 Other acute sinusitis: Secondary | ICD-10-CM

## 2013-04-06 DIAGNOSIS — J209 Acute bronchitis, unspecified: Secondary | ICD-10-CM

## 2013-04-06 DIAGNOSIS — R509 Fever, unspecified: Secondary | ICD-10-CM

## 2013-04-06 DIAGNOSIS — R197 Diarrhea, unspecified: Secondary | ICD-10-CM

## 2013-04-06 LAB — POCT INFLUENZA A/B
Influenza A, POC: NEGATIVE
Influenza B, POC: NEGATIVE

## 2013-04-06 MED ORDER — HYDROCOD POLST-CHLORPHEN POLST 10-8 MG/5ML PO LQCR: 5.0000 mL | Freq: Two times a day (BID) | ORAL | Status: DC | PRN

## 2013-04-06 MED ORDER — PSEUDOEPHEDRINE-GUAIFENESIN ER 60-600 MG PO TB12: 1.0000 | ORAL_TABLET | Freq: Two times a day (BID) | ORAL | Status: AC

## 2013-04-06 MED ORDER — LEVOFLOXACIN 500 MG PO TABS: 500.0000 mg | ORAL_TABLET | Freq: Every day | ORAL | Status: AC

## 2013-04-06 MED ORDER — LOPERAMIDE HCL 2 MG PO TABS: ORAL_TABLET | ORAL | Status: DC

## 2013-04-06 NOTE — Progress Notes (Signed)
[facility] [address], [place] [address] [phone]  Date:  04/06/2013   Name:  Kenneth Cline   DOB:  26-Oct-1955   MRN:  [id number]  PCP:  Tomi Bamberger, NP    Chief Complaint: Nausea, Diarrhea, Cough and Generalized Body Aches   History of Present Illness:  Ward Boissonneault is a 57 y.o. very pleasant male patient who presents with the following:  Nasal congestion for past 10 days.  Last night developed a cough and fever.  Has sore throat and trouble swallowing.  Nauseated and vomited once. Has had six loose stools.  No rash.  Has a fever of 101.6 despite advil.  The patient has no complaint of blood, mucous, or pus in her stools.  Took a biaxin this morning despite his vigorous apparent reaction to it last time he was treated.   No improvement with over the counter medications or other home remedies. Denies other complaint or health concern today. .    Patient Active Problem List   Diagnosis Date Noted  . Hypertension, essential, benign     Class: Chronic  . Dyslipidemia   . Overweight (BMI 25.0-29.9)   . Erectile dysfunction   . Family history of premature coronary artery disease   . ALLERGIC REACTION 06/14/2007    Past Medical History  Diagnosis Date  . Hypertension   . GERD (gastroesophageal reflux disease)   . Dyslipidemia   . Overweight (BMI 25.0-29.9)   . Erectile dysfunction   . Family history of premature coronary artery disease     2 brothers, one sister and father all with CAD.  One sister with valve disease.    Past Surgical History  Procedure Laterality Date  . Cpet/met  11/27/2011    normal PFT, good effort-no ischemia burden  . Nm myocar perf wall motion  06/28/2006    EF 70% , LV systolic fx norm.    History  Substance Use Topics  . Smoking status: Never Smoker   . Smokeless tobacco: Not on file  . Alcohol Use: 1.5 oz/week    3 drink(s) per week    Family History  Problem Relation Age of Onset  . Heart attack  Father 53    Diet cardiac arrest  . Heart disease Sister 66    triscuspid valve insuff,cardioversion  . Hypertension Sister     She is currently in her 67s as well  . Hypertension Brother   . Heart disease Brother 49    CABG; now age 33  . Ovarian cancer Maternal Grandmother   . Heart attack Brother 66    Died from cardiac arrest  . Hypertension Brother   . Hypertension Sister   . Heart disease Sister 58    CABG plus valve in 50's;   . Sudden death Brother   . Sudden death Father 58  . Heart failure Sister     Died from complications of CHF following bowel surgery    Allergies  Allergen Reactions  . Biaxin [Clarithromycin]   . Penicillins     REACTION: rash    Medication list has been reviewed and updated.  Current Outpatient Prescriptions on File Prior to Visit  Medication Sig Dispense Refill  . aspirin EC 81 MG tablet Take 81 mg by mouth daily.      . cetirizine (ZYRTEC) 10 MG tablet Take 10 mg by mouth daily.      Marland Kitchen ezetimibe (ZETIA) 10 MG tablet Take 1 tablet (10 mg  total) by mouth daily.  90 tablet  3  . fish oil-omega-3 fatty acids 1000 MG capsule       . lisinopril-hydrochlorothiazide (PRINZIDE,ZESTORETIC) 20-12.5 MG per tablet Take 1 tablet by mouth daily.  90 tablet  3  . pantoprazole (PROTONIX) 40 MG tablet Take 1 tablet (40 mg total) by mouth daily.  90 tablet  3  . sildenafil (VIAGRA) 100 MG tablet Take 1 tablet (100 mg total) by mouth daily as needed for erectile dysfunction.  12 tablet  11   No current facility-administered medications on file prior to visit.    Review of Systems:  As per HPI, otherwise negative.    Physical Examination: Filed Vitals:   04/06/13 1503  BP: 98/64  Pulse: 118  Temp: 98.6 F (37 C)  Resp: 16   Filed Vitals:   04/06/13 1503  Height: 5' 9.5" (1.765 m)  Weight: 200 lb 3.2 oz (90.81 kg)   Body mass index is 29.15 kg/(m^2). Ideal Body Weight: Weight in (lb) to have BMI = 25: 171.4  GEN: WDWN, NAD, Non-toxic, A &  O x 3  Well hydrated HEENT: Atraumatic, Normocephalic. Neck supple. No masses, No LAD.  Purulent post nasal drip Ears and Nose: No external deformity. CV: RRR, No M/G/R. No JVD. No thrill. No extra heart sounds. PULM: CTA B, no wheezes, crackles, rhonchi. No retractions. No resp. distress. No accessory muscle use. ABD: S, NT, ND, +BS. No rebound. No HSM. EXTR: No c/c/e NEURO Normal gait.  PSYCH: Normally interactive. Conversant. Not depressed or anxious appearing.  Calm demeanor.    Assessment and Plan: Sinusitis Bronchitis Diarrhea levaquin due allergies mucinex d tussionex Imodium  Signed,  Phillips Odor, MD

## 2013-04-06 NOTE — Patient Instructions (Signed)

## 2013-04-08 DIAGNOSIS — L511 Stevens-Johnson syndrome: Secondary | ICD-10-CM

## 2013-04-08 HISTORY — DX: Stevens-Johnson syndrome: L51.1

## 2013-05-08 ENCOUNTER — Ambulatory Visit
Admission: RE | Admit: 2013-05-08 | Discharge: 2013-05-08 | Disposition: A | Discharge status: Home / Self Care | Payer: BC Managed Care – PPO | Source: Ambulatory Visit | Attending: Nurse Practitioner | Admitting: Nurse Practitioner

## 2013-05-08 ENCOUNTER — Other Ambulatory Visit: Payer: Self-pay | Admitting: Nurse Practitioner

## 2013-05-08 DIAGNOSIS — J189 Pneumonia, unspecified organism: Secondary | ICD-10-CM

## 2013-11-05 ENCOUNTER — Other Ambulatory Visit: Payer: Self-pay | Admitting: *Deleted

## 2013-11-05 MED ORDER — EZETIMIBE 10 MG PO TABS: 10.0000 mg | ORAL_TABLET | Freq: Every day | ORAL | Status: DC

## 2013-11-05 MED ORDER — PANTOPRAZOLE SODIUM 40 MG PO TBEC: 40.0000 mg | DELAYED_RELEASE_TABLET | Freq: Every day | ORAL | Status: DC

## 2013-11-05 MED ORDER — LISINOPRIL-HYDROCHLOROTHIAZIDE 20-12.5 MG PO TABS: 1.0000 | ORAL_TABLET | Freq: Every day | ORAL | Status: DC

## 2013-11-05 NOTE — Telephone Encounter (Signed)
Rx refill sent to patient pharmacy   

## 2013-12-31 ENCOUNTER — Encounter: Payer: Self-pay | Admitting: Cardiology

## 2013-12-31 ENCOUNTER — Ambulatory Visit (INDEPENDENT_AMBULATORY_CARE_PROVIDER_SITE_OTHER): Payer: BC Managed Care – PPO | Admitting: Cardiology

## 2013-12-31 VITALS — BP 130/80 | HR 75 | Ht 69.0 in | Wt 202.6 lb

## 2013-12-31 DIAGNOSIS — N529 Male erectile dysfunction, unspecified: Secondary | ICD-10-CM

## 2013-12-31 DIAGNOSIS — E785 Hyperlipidemia, unspecified: Secondary | ICD-10-CM

## 2013-12-31 DIAGNOSIS — E782 Mixed hyperlipidemia: Secondary | ICD-10-CM

## 2013-12-31 DIAGNOSIS — E663 Overweight: Secondary | ICD-10-CM

## 2013-12-31 DIAGNOSIS — N528 Other male erectile dysfunction: Secondary | ICD-10-CM

## 2013-12-31 DIAGNOSIS — I10 Essential (primary) hypertension: Secondary | ICD-10-CM

## 2013-12-31 DIAGNOSIS — Z79899 Other long term (current) drug therapy: Secondary | ICD-10-CM

## 2013-12-31 DIAGNOSIS — Z8249 Family history of ischemic heart disease and other diseases of the circulatory system: Secondary | ICD-10-CM

## 2013-12-31 MED ORDER — LISINOPRIL-HYDROCHLOROTHIAZIDE 20-12.5 MG PO TABS: 1.0000 | ORAL_TABLET | Freq: Every day | ORAL | Status: DC

## 2013-12-31 MED ORDER — SILDENAFIL CITRATE 100 MG PO TABS: 100.0000 mg | ORAL_TABLET | Freq: Every day | ORAL | Status: DC | PRN

## 2013-12-31 MED ORDER — PANTOPRAZOLE SODIUM 40 MG PO TBEC: 40.0000 mg | DELAYED_RELEASE_TABLET | Freq: Every day | ORAL | Status: DC

## 2013-12-31 MED ORDER — EZETIMIBE 10 MG PO TABS
10.0000 mg | ORAL_TABLET | Freq: Every day | ORAL | Status: DC
Start: 2013-12-31 — End: 2014-10-21

## 2013-12-31 NOTE — Patient Instructions (Signed)
LABS LIPID AND CMP     Your physician wants you to follow-up in [place].  You will receive a reminder letter in the mail two months in advance. If you don't receive a letter, please call our office to schedule the follow-up appointment.

## 2014-01-02 ENCOUNTER — Encounter: Payer: Self-pay | Admitting: Cardiology

## 2014-01-02 NOTE — Assessment & Plan Note (Signed)
Stable weight. Needs to continue exercising and watching his diet in order to try to lose a few pounds. He is borderline obese.

## 2014-01-02 NOTE — Assessment & Plan Note (Addendum)
On Zetia. Called by PCP. Goal LDL for him is less than 130, however with his family history we may but consider less than 100.

## 2014-01-02 NOTE — Assessment & Plan Note (Signed)
He has taking his family history very seriously. He is believed age on reducing his risk factors. He is staying active and continues to have no symptoms.  At this point him in the absence of any symptoms I don't think the need to do any additional studies. I hope to do followup CPET-MET tests, but unfortunately we do not have that test available any longer.

## 2014-01-02 NOTE — Progress Notes (Signed)
PCP: Delia Chimes, NP  Clinic Note: Chief Complaint  Patient presents with  . Follow-up    1 year visit. pt denies chest pain and sob. occasional swelling in ankles.    HPI: Kenneth Cline is a 58 y.o. male with a Cardiovascular Problem List below who presents today for annual followup. He is very pleasant gentleman with this extensive family history of premature corner disease as follows:Marland Kitchen Father: Died at age 6 of MI with sudden cardiac arrest.  Brother: CABG at age 34, now 57.  Brother (twin sister with CABG): Died following MI with cardiac arrest at age 21  Sister: CABG in her 32s. Diet CHF after bowel surgery.  Second Sister: History of arrhythmias and valve disease  Paternal grandmother died a couple cases of childbirth related CHF (peripartum cardiomyopathy)  He has been evaluated so far with a treadmill nuclear stress test in 2008 there was no ischemic. He had a CPET-MET test done when I first met him in 2013. That was also relatively normal.  Interval History: He presents today doing well with no major complaints. He does not get routine exercise but does walk pretty much every day. He does a lot of walking at work as well. He denies any chest tightness or pressure at rest or with exertion. No PND, orthopnea; only occasional ankle edema.  No palpitations, lightheadedness, dizziness, weakness or syncope/near syncope. No TIA/amaurosis fugax symptoms. No melena, hematochezia, hematuria, or epstaxis. No claudication.  Past Medical History  Diagnosis Date  . Hypertension   . GERD (gastroesophageal reflux disease)   . Dyslipidemia   . Overweight (BMI 25.0-29.9)   . Erectile dysfunction   . Family history of premature coronary artery disease     2 brothers, one sister and father all with CAD.  One sister with valve disease.   Prior Cardiac Evaluation and Past Surgical History: Past Surgical History  Procedure Laterality Date  . Cpet/met  11/27/2011    normal PFT, good  effort-no ischemia burden  . Nm myocar perf wall motion  06/28/2006    EF 24% , LV systolic fx norm.   MEDICATIONS AND ALLERGIES REVIEWED IN EPIC No Change in Social and Family History  ROS: A comprehensive Review of Systems - was performed Review of Systems  Constitutional: Negative for weight loss, malaise/fatigue and diaphoresis.  HENT: Negative for congestion, nosebleeds and sore throat.   Eyes: Negative for blurred vision and double vision.  Respiratory: Negative for cough, hemoptysis, sputum production, shortness of breath, wheezing and stridor.   Cardiovascular:       Erectile dysfunction  Gastrointestinal: Negative for blood in stool and melena.  Neurological: Negative for dizziness, tingling, speech change, focal weakness, seizures, loss of consciousness, weakness and headaches.  Psychiatric/Behavioral: Negative for depression. The patient is not nervous/anxious.   All other systems reviewed and are negative.  Wt Readings from Last 3 Encounters:  12/31/13 202 lb 9.6 oz (91.899 kg)  04/06/13 200 lb 3.2 oz (90.81 kg)  11/25/12 196 lb 11.2 oz (89.223 kg)   PHYSICAL EXAM BP 130/80  Pulse 75  Ht 5' 9"  (1.753 m)  Wt 202 lb 9.6 oz (91.899 kg)  BMI 29.91 kg/m2 General appearance: alert, cooperative, appears stated age, no distress and Well-nourished and well-groomed. Answers questions appropriately.  HEENT: [place]/AT, EOMI, MMM, anicteric sclera Neck: no adenopathy, no carotid bruit, no JVD and supple, symmetrical, trachea midline  Lungs: CTA B., normal percussion bilaterally and Nonlabored, good air movement  Heart: RRR, S1, S2 normal,  no murmur, click, rub or gallop and normal apical impulse  Abdomen: soft, non-tender; bowel sounds normal; no masses, no organomegaly  Extremities: extremities normal, atraumatic, no cyanosis or edema and no ulcers, gangrene or trophic changes  Pulses: 2+ and symmetric  Neurologic: Alert and oriented X 3, normal strength and tone. Normal symmetric  reflexes. Normal coordination and gait    Adult ECG Report  Rate: 75 ;  Rhythm: normal sinus rhythm  Narrative Interpretation: Normal EKG  No Recent Labs available   ASSESSMENT / PLAN: Family history of premature coronary artery disease He has taking his family history very seriously. He is believed age on reducing his risk factors. He is staying active and continues to have no symptoms.  At this point him in the absence of any symptoms I don't think the need to do any additional studies. I hope to do followup CPET-MET tests, but unfortunately we do not have that test available any longer.  Erectile dysfunction Refill Viagra  Dyslipidemia On Zetia. Called by PCP. Goal LDL for him is less than 130, however with his family history we may but consider less than 100.  Hypertension, essential, benign Well controlled on ACE inhibitor/HCTZ combo.  Overweight (BMI 25.0-29.9) Stable weight. Needs to continue exercising and watching his diet in order to try to lose a few pounds. He is borderline obese.    Orders Placed This Encounter  Procedures  . Lipid panel    Order Specific Question:  Has the patient fasted?    Answer:  Yes  . Comprehensive metabolic panel    Order Specific Question:  Has the patient fasted?    Answer:  Yes  . EKG 12-Lead   Meds ordered this encounter  Medications  . NASONEX 50 MCG/ACT nasal spray    Sig:   . ezetimibe (ZETIA) 10 MG tablet    Sig: Take 1 tablet (10 mg total) by mouth daily.    Dispense:  90 tablet    Refill:  3  . lisinopril-hydrochlorothiazide (PRINZIDE,ZESTORETIC) 20-12.5 MG per tablet    Sig: Take 1 tablet by mouth daily.    Dispense:  90 tablet    Refill:  3  . sildenafil (VIAGRA) 100 MG tablet    Sig: Take 1 tablet (100 mg total) by mouth daily as needed for erectile dysfunction.    Dispense:  12 tablet    Refill:  11  . pantoprazole (PROTONIX) 40 MG tablet    Sig: Take 1 tablet (40 mg total) by mouth daily.    Dispense:  90  tablet    Refill:  3    Followup: One year  DAVID W. Ellyn Hack, M.D., M.S. Interventional Cardiologist [facility]

## 2014-01-02 NOTE — Assessment & Plan Note (Signed)
Refill Viagra.

## 2014-01-02 NOTE — Assessment & Plan Note (Signed)
Well controlled on ACE inhibitor/HCTZ combo.

## 2014-01-07 ENCOUNTER — Telehealth: Payer: Self-pay | Admitting: Cardiology

## 2014-01-07 NOTE — Telephone Encounter (Signed)
Levada Dy called in stating that the pt had a prescription for Viagra 100mg . She said that would be $400 on his insurance so she found that if he could get the Sildenafil 20mg  that would be much cheaper. She was wanting Dr. Ellyn Hack to sign off on this. Please call  Thanks

## 2014-01-07 NOTE — Telephone Encounter (Signed)
I am fine with generic Rx. Was not aware of the difference.  Leonie Man, MD

## 2014-01-08 MED ORDER — SILDENAFIL CITRATE 20 MG PO TABS: 40.0000 mg | ORAL_TABLET | Freq: Every day | ORAL | Status: DC | PRN

## 2014-01-08 NOTE — Telephone Encounter (Signed)
Rx was sent to pharmacy electronically for generic sildenafil

## 2014-01-13 ENCOUNTER — Telehealth: Payer: Self-pay | Admitting: Cardiology

## 2014-01-13 NOTE — Telephone Encounter (Signed)
Ophelia Charter is calling in because she needs a prior authorization for the pt's Sildenafil prescription. Please call  Thanks

## 2014-01-13 NOTE — Telephone Encounter (Signed)
Sure.  He is not on a nitrate.   That would be fine if the $$ is right.  Leonie Man, MD

## 2014-01-13 NOTE — Telephone Encounter (Signed)
Returned call to pharmacy. Brand name Viagra was too expensive and generic sildenafil needs prior authorization ([id number]) from [place]. Pharmacy staff informed RN that cialis is preferred on their formulary.   Should we switch from sildenafil to cialis?  Deferred to Dr. Ellyn Hack to advise.

## 2014-01-14 MED ORDER — TADALAFIL 20 MG PO TABS: 20.0000 mg | ORAL_TABLET | Freq: Every day | ORAL | Status: DC | PRN

## 2014-01-14 NOTE — Addendum Note (Signed)
Addended by: Raiford Simmonds on: 01/14/2014 11:26 AM   Modules accepted: Orders, Medications

## 2014-01-14 NOTE — Telephone Encounter (Addendum)
SPOKE TO PHARMACIST  D/C SILDENAFIL  TO CIALIS 20 MG TAKE AS NEEDED  VERBAL ORDER  GIVEN.

## 2014-03-30 ENCOUNTER — Other Ambulatory Visit: Payer: Self-pay | Admitting: Occupational Medicine

## 2014-03-30 ENCOUNTER — Ambulatory Visit: Payer: Self-pay

## 2014-03-30 DIAGNOSIS — M25511 Pain in right shoulder: Secondary | ICD-10-CM

## 2014-04-03 ENCOUNTER — Other Ambulatory Visit: Payer: Self-pay | Admitting: Occupational Medicine

## 2014-04-03 ENCOUNTER — Ambulatory Visit: Payer: Self-pay

## 2014-04-03 DIAGNOSIS — R52 Pain, unspecified: Secondary | ICD-10-CM

## 2014-04-24 DIAGNOSIS — R760 Raised antibody titer: Secondary | ICD-10-CM

## 2014-04-24 HISTORY — DX: Raised antibody titer: R76.0

## 2014-05-14 ENCOUNTER — Telehealth: Payer: Self-pay | Admitting: *Deleted

## 2014-05-14 NOTE — Telephone Encounter (Signed)
Faxed [facility]-- cleared for right shoulder - right shoulder scope-w partial acromioplasty w/wo coracoacromial release Continue current treatment per Dr Ellyn Hack.

## 2014-07-02 ENCOUNTER — Telehealth: Payer: Self-pay | Admitting: *Deleted

## 2014-07-02 NOTE — Telephone Encounter (Signed)
Faxed ,signed cardiac clearance for right shoulder scope , mini open RCR,subscap repair,bi tenodesis, open DCR, AC DJD, BICEPS TENODESIS-open, OPEN DCR,SA-SAD-SCOPE w partial acromioplasty w/wo coracoacromial release Per Dr Ellyn Hack, continue RX ; ok to stop ASA 5 day pre-op

## 2014-07-24 DIAGNOSIS — E2749 Other adrenocortical insufficiency: Secondary | ICD-10-CM

## 2014-07-24 DIAGNOSIS — E2740 Unspecified adrenocortical insufficiency: Secondary | ICD-10-CM

## 2014-07-24 HISTORY — DX: Unspecified adrenocortical insufficiency: E27.40

## 2014-07-24 HISTORY — DX: Other adrenocortical insufficiency: E27.49

## 2014-08-10 ENCOUNTER — Other Ambulatory Visit (HOSPITAL_COMMUNITY): Payer: Self-pay

## 2014-08-10 ENCOUNTER — Emergency Department (HOSPITAL_COMMUNITY): Payer: Worker's Compensation

## 2014-08-10 ENCOUNTER — Other Ambulatory Visit: Payer: Self-pay

## 2014-08-10 ENCOUNTER — Inpatient Hospital Stay (HOSPITAL_COMMUNITY)
Admission: EM | Admit: 2014-08-10 | Discharge: 2014-08-20 | DRG: 643 | Disposition: A | Discharge status: Home / Self Care | Payer: Worker's Compensation | Attending: Internal Medicine | Admitting: Internal Medicine

## 2014-08-10 ENCOUNTER — Encounter (HOSPITAL_COMMUNITY): Payer: Self-pay | Admitting: Emergency Medicine

## 2014-08-10 DIAGNOSIS — R109 Unspecified abdominal pain: Secondary | ICD-10-CM

## 2014-08-10 DIAGNOSIS — I10 Essential (primary) hypertension: Secondary | ICD-10-CM | POA: Diagnosis present

## 2014-08-10 DIAGNOSIS — Z6825 Body mass index (BMI) 25.0-25.9, adult: Secondary | ICD-10-CM

## 2014-08-10 DIAGNOSIS — R079 Chest pain, unspecified: Secondary | ICD-10-CM

## 2014-08-10 DIAGNOSIS — E871 Hypo-osmolality and hyponatremia: Secondary | ICD-10-CM | POA: Diagnosis present

## 2014-08-10 DIAGNOSIS — D692 Other nonthrombocytopenic purpura: Secondary | ICD-10-CM | POA: Diagnosis not present

## 2014-08-10 DIAGNOSIS — E2740 Unspecified adrenocortical insufficiency: Secondary | ICD-10-CM | POA: Diagnosis present

## 2014-08-10 DIAGNOSIS — R1110 Vomiting, unspecified: Secondary | ICD-10-CM

## 2014-08-10 DIAGNOSIS — E2749 Other adrenocortical insufficiency: Secondary | ICD-10-CM | POA: Diagnosis not present

## 2014-08-10 DIAGNOSIS — J189 Pneumonia, unspecified organism: Secondary | ICD-10-CM | POA: Diagnosis present

## 2014-08-10 DIAGNOSIS — D696 Thrombocytopenia, unspecified: Secondary | ICD-10-CM | POA: Diagnosis present

## 2014-08-10 DIAGNOSIS — R21 Rash and other nonspecific skin eruption: Secondary | ICD-10-CM | POA: Diagnosis not present

## 2014-08-10 DIAGNOSIS — R509 Fever, unspecified: Secondary | ICD-10-CM | POA: Diagnosis not present

## 2014-08-10 DIAGNOSIS — E663 Overweight: Secondary | ICD-10-CM | POA: Diagnosis present

## 2014-08-10 DIAGNOSIS — E876 Hypokalemia: Secondary | ICD-10-CM | POA: Diagnosis present

## 2014-08-10 DIAGNOSIS — K5909 Other constipation: Secondary | ICD-10-CM | POA: Diagnosis present

## 2014-08-10 DIAGNOSIS — J9811 Atelectasis: Secondary | ICD-10-CM | POA: Diagnosis not present

## 2014-08-10 DIAGNOSIS — K649 Unspecified hemorrhoids: Secondary | ICD-10-CM | POA: Diagnosis present

## 2014-08-10 DIAGNOSIS — K5900 Constipation, unspecified: Secondary | ICD-10-CM | POA: Diagnosis present

## 2014-08-10 DIAGNOSIS — Z8249 Family history of ischemic heart disease and other diseases of the circulatory system: Secondary | ICD-10-CM

## 2014-08-10 DIAGNOSIS — K219 Gastro-esophageal reflux disease without esophagitis: Secondary | ICD-10-CM | POA: Diagnosis present

## 2014-08-10 DIAGNOSIS — E785 Hyperlipidemia, unspecified: Secondary | ICD-10-CM | POA: Diagnosis present

## 2014-08-10 DIAGNOSIS — E278 Other specified disorders of adrenal gland: Secondary | ICD-10-CM

## 2014-08-10 DIAGNOSIS — R739 Hyperglycemia, unspecified: Secondary | ICD-10-CM | POA: Diagnosis present

## 2014-08-10 DIAGNOSIS — Z7982 Long term (current) use of aspirin: Secondary | ICD-10-CM

## 2014-08-10 DIAGNOSIS — K913 Postprocedural intestinal obstruction: Secondary | ICD-10-CM | POA: Diagnosis present

## 2014-08-10 DIAGNOSIS — R197 Diarrhea, unspecified: Secondary | ICD-10-CM | POA: Diagnosis present

## 2014-08-10 DIAGNOSIS — E039 Hypothyroidism, unspecified: Secondary | ICD-10-CM | POA: Diagnosis present

## 2014-08-10 DIAGNOSIS — T40605A Adverse effect of unspecified narcotics, initial encounter: Secondary | ICD-10-CM | POA: Diagnosis present

## 2014-08-10 HISTORY — DX: Stevens-Johnson syndrome: L51.1

## 2014-08-10 LAB — CBC
HEMATOCRIT: 39.9 % (ref 39.0–52.0)
Hemoglobin: 14.3 g/dL (ref 13.0–17.0)
MCH: 30.2 pg (ref 26.0–34.0)
MCHC: 35.8 g/dL (ref 30.0–36.0)
MCV: 84.4 fL (ref 78.0–100.0)
Platelets: 109 10*3/uL — ABNORMAL LOW (ref 150–400)
RBC: 4.73 MIL/uL (ref 4.22–5.81)
RDW: 12 % (ref 11.5–15.5)
WBC: 5.7 10*3/uL (ref 4.0–10.5)

## 2014-08-10 LAB — BRAIN NATRIURETIC PEPTIDE: B Natriuretic Peptide: 22.5 pg/mL (ref 0.0–100.0)

## 2014-08-10 LAB — BASIC METABOLIC PANEL
Anion gap: 13 (ref 5–15)
BUN: 12 mg/dL (ref 6–23)
CO2: 26 mmol/L (ref 19–32)
CREATININE: 0.96 mg/dL (ref 0.50–1.35)
Calcium: 9.2 mg/dL (ref 8.4–10.5)
Chloride: 87 mmol/L — ABNORMAL LOW (ref 96–112)
GFR calc Af Amer: >90 mL/min (ref >90)
GFR calc non Af Amer: 89 mL/min — ABNORMAL LOW (ref >90)
GLUCOSE: 226 mg/dL — AB (ref 70–99)
POTASSIUM: 3.5 mmol/L (ref 3.5–5.1)
Sodium: 126 mmol/L — ABNORMAL LOW (ref 135–145)

## 2014-08-10 LAB — I-STAT TROPONIN, ED: Troponin i, poc: 0.01 ng/mL (ref 0.00–0.08)

## 2014-08-10 NOTE — ED Notes (Signed)
Pt. presents with multiple complaints : left chest pain with SOB onset today  , constipation for 1 week and mid/low back pain . Denies fever or chills. , rotator cuff surgery last week by Dr. Veverly Fells.

## 2014-08-10 NOTE — ED Notes (Signed)
Wife notified this nurse that she gave the patient an Oxycodone 15mg  of his own med.

## 2014-08-11 ENCOUNTER — Encounter (HOSPITAL_COMMUNITY): Payer: Self-pay | Admitting: Radiology

## 2014-08-11 ENCOUNTER — Emergency Department (HOSPITAL_COMMUNITY): Payer: Worker's Compensation

## 2014-08-11 DIAGNOSIS — E2749 Other adrenocortical insufficiency: Secondary | ICD-10-CM | POA: Diagnosis present

## 2014-08-11 DIAGNOSIS — D704 Cyclic neutropenia: Secondary | ICD-10-CM

## 2014-08-11 DIAGNOSIS — E785 Hyperlipidemia, unspecified: Secondary | ICD-10-CM | POA: Diagnosis present

## 2014-08-11 DIAGNOSIS — Z7982 Long term (current) use of aspirin: Secondary | ICD-10-CM | POA: Diagnosis not present

## 2014-08-11 DIAGNOSIS — E876 Hypokalemia: Secondary | ICD-10-CM | POA: Diagnosis present

## 2014-08-11 DIAGNOSIS — K913 Postprocedural intestinal obstruction: Secondary | ICD-10-CM | POA: Diagnosis present

## 2014-08-11 DIAGNOSIS — J9811 Atelectasis: Secondary | ICD-10-CM | POA: Diagnosis not present

## 2014-08-11 DIAGNOSIS — R739 Hyperglycemia, unspecified: Secondary | ICD-10-CM | POA: Diagnosis present

## 2014-08-11 DIAGNOSIS — T40605A Adverse effect of unspecified narcotics, initial encounter: Secondary | ICD-10-CM | POA: Diagnosis present

## 2014-08-11 DIAGNOSIS — E039 Hypothyroidism, unspecified: Secondary | ICD-10-CM | POA: Diagnosis present

## 2014-08-11 DIAGNOSIS — K5909 Other constipation: Secondary | ICD-10-CM | POA: Diagnosis present

## 2014-08-11 DIAGNOSIS — Z6825 Body mass index (BMI) 25.0-25.9, adult: Secondary | ICD-10-CM | POA: Diagnosis not present

## 2014-08-11 DIAGNOSIS — E871 Hypo-osmolality and hyponatremia: Secondary | ICD-10-CM | POA: Diagnosis present

## 2014-08-11 DIAGNOSIS — D692 Other nonthrombocytopenic purpura: Secondary | ICD-10-CM | POA: Diagnosis not present

## 2014-08-11 DIAGNOSIS — R079 Chest pain, unspecified: Secondary | ICD-10-CM | POA: Diagnosis present

## 2014-08-11 DIAGNOSIS — Z8249 Family history of ischemic heart disease and other diseases of the circulatory system: Secondary | ICD-10-CM | POA: Diagnosis not present

## 2014-08-11 DIAGNOSIS — K5901 Slow transit constipation: Secondary | ICD-10-CM | POA: Diagnosis not present

## 2014-08-11 DIAGNOSIS — L511 Stevens-Johnson syndrome: Secondary | ICD-10-CM | POA: Diagnosis not present

## 2014-08-11 DIAGNOSIS — D696 Thrombocytopenia, unspecified: Secondary | ICD-10-CM | POA: Diagnosis present

## 2014-08-11 DIAGNOSIS — E663 Overweight: Secondary | ICD-10-CM | POA: Diagnosis present

## 2014-08-11 DIAGNOSIS — J189 Pneumonia, unspecified organism: Secondary | ICD-10-CM | POA: Diagnosis present

## 2014-08-11 DIAGNOSIS — R509 Fever, unspecified: Secondary | ICD-10-CM | POA: Diagnosis not present

## 2014-08-11 DIAGNOSIS — I10 Essential (primary) hypertension: Secondary | ICD-10-CM | POA: Diagnosis present

## 2014-08-11 DIAGNOSIS — R21 Rash and other nonspecific skin eruption: Secondary | ICD-10-CM | POA: Diagnosis not present

## 2014-08-11 DIAGNOSIS — K649 Unspecified hemorrhoids: Secondary | ICD-10-CM | POA: Diagnosis present

## 2014-08-11 DIAGNOSIS — E2740 Unspecified adrenocortical insufficiency: Secondary | ICD-10-CM | POA: Diagnosis not present

## 2014-08-11 DIAGNOSIS — K219 Gastro-esophageal reflux disease without esophagitis: Secondary | ICD-10-CM | POA: Diagnosis present

## 2014-08-11 LAB — URINALYSIS, ROUTINE W REFLEX MICROSCOPIC
Bilirubin Urine: NEGATIVE
Glucose, UA: 500 mg/dL — AB
Ketones, ur: 15 mg/dL — AB
LEUKOCYTES UA: NEGATIVE
Nitrite: NEGATIVE
PROTEIN: 100 mg/dL — AB
Specific Gravity, Urine: 1.026 (ref 1.005–1.030)
UROBILINOGEN UA: 1 mg/dL (ref 0.0–1.0)
pH: 6.5 (ref 5.0–8.0)

## 2014-08-11 LAB — HEPATIC FUNCTION PANEL
ALBUMIN: 3.4 g/dL — AB (ref 3.5–5.2)
ALT: 28 U/L (ref 0–53)
AST: 26 U/L (ref 0–37)
Alkaline Phosphatase: 63 U/L (ref 39–117)
BILIRUBIN TOTAL: 1.6 mg/dL — AB (ref 0.3–1.2)
Bilirubin, Direct: 0.3 mg/dL (ref 0.0–0.5)
Indirect Bilirubin: 1.3 mg/dL — ABNORMAL HIGH (ref 0.3–0.9)
TOTAL PROTEIN: 7.4 g/dL (ref 6.0–8.3)

## 2014-08-11 LAB — TYPE AND SCREEN
ABO/RH(D): O POS
Antibody Screen: NEGATIVE

## 2014-08-11 LAB — ABO/RH: ABO/RH(D): O POS

## 2014-08-11 LAB — I-STAT TROPONIN, ED: TROPONIN I, POC: 0.03 ng/mL (ref 0.00–0.08)

## 2014-08-11 LAB — URINE MICROSCOPIC-ADD ON

## 2014-08-11 LAB — LACTATE DEHYDROGENASE: LDH: 204 U/L (ref 94–250)

## 2014-08-11 LAB — LIPASE, BLOOD: LIPASE: 25 U/L (ref 11–59)

## 2014-08-11 LAB — PROTIME-INR
INR: 1.14 (ref 0.00–1.49)
Prothrombin Time: 14.7 seconds (ref 11.6–15.2)

## 2014-08-11 LAB — APTT: aPTT: 54 seconds — ABNORMAL HIGH (ref 24–37)

## 2014-08-11 LAB — URIC ACID: Uric Acid, Serum: 4.6 mg/dL (ref 4.0–7.8)

## 2014-08-11 MED ORDER — ONDANSETRON HCL 4 MG/2ML IJ SOLN: 4.0000 mg | Freq: Four times a day (QID) | INTRAMUSCULAR | Status: DC | PRN

## 2014-08-11 MED ORDER — SODIUM CHLORIDE 0.9 % IV BOLUS (SEPSIS)
1000.0000 mL | Freq: Once | INTRAVENOUS | Status: AC
  Administered 2014-08-11: 1000 mL via INTRAVENOUS

## 2014-08-11 MED ORDER — COSYNTROPIN 0.25 MG IJ SOLR
0.2500 mg | Freq: Once | INTRAMUSCULAR | Status: AC
  Administered 2014-08-12: 0.25 mg via INTRAVENOUS
  Filled 2014-08-11: qty 0.25

## 2014-08-11 MED ORDER — MORPHINE SULFATE 2 MG/ML IJ SOLN
2.0000 mg | INTRAMUSCULAR | Status: DC | PRN
  Administered 2014-08-11 – 2014-08-12 (×5): 2 mg via INTRAVENOUS
  Filled 2014-08-11 (×5): qty 1

## 2014-08-11 MED ORDER — ONDANSETRON HCL 4 MG/2ML IJ SOLN
4.0000 mg | Freq: Once | INTRAMUSCULAR | Status: AC
  Administered 2014-08-11: 4 mg via INTRAVENOUS
  Filled 2014-08-11: qty 2

## 2014-08-11 MED ORDER — PROMETHAZINE HCL 25 MG/ML IJ SOLN
12.5000 mg | Freq: Once | INTRAMUSCULAR | Status: AC
  Administered 2014-08-11: 12.5 mg via INTRAVENOUS
  Filled 2014-08-11: qty 1

## 2014-08-11 MED ORDER — LORAZEPAM 2 MG/ML IJ SOLN
0.5000 mg | Freq: Once | INTRAMUSCULAR | Status: AC
  Administered 2014-08-11: 0.5 mg via INTRAVENOUS
  Filled 2014-08-11: qty 1

## 2014-08-11 MED ORDER — HYDROCORTISONE NA SUCCINATE PF 100 MG IJ SOLR
100.0000 mg | Freq: Once | INTRAMUSCULAR | Status: AC
  Administered 2014-08-11: 100 mg via INTRAVENOUS
  Filled 2014-08-11: qty 2

## 2014-08-11 MED ORDER — MORPHINE SULFATE 4 MG/ML IJ SOLN
4.0000 mg | Freq: Once | INTRAMUSCULAR | Status: AC
  Administered 2014-08-11: 4 mg via INTRAVENOUS
  Filled 2014-08-11: qty 1

## 2014-08-11 MED ORDER — IOHEXOL 300 MG/ML  SOLN
100.0000 mL | Freq: Once | INTRAMUSCULAR | Status: AC | PRN
  Administered 2014-08-11: 100 mL via INTRAVENOUS

## 2014-08-11 MED ORDER — IOHEXOL 300 MG/ML  SOLN
25.0000 mL | Freq: Once | INTRAMUSCULAR | Status: AC | PRN
  Administered 2014-08-11: 25 mL via ORAL

## 2014-08-11 MED ORDER — MORPHINE SULFATE 4 MG/ML IJ SOLN
4.0000 mg | Freq: Once | INTRAMUSCULAR | Status: AC
Start: 2014-08-11 — End: 2014-08-11
  Administered 2014-08-11: 4 mg via INTRAVENOUS
  Filled 2014-08-11: qty 1

## 2014-08-11 MED ORDER — SODIUM CHLORIDE 0.9 % IV SOLN
INTRAVENOUS | Status: DC
  Administered 2014-08-11: 08:00:00 via INTRAVENOUS

## 2014-08-11 MED ORDER — SODIUM CHLORIDE 0.9 % IV SOLN
INTRAVENOUS | Status: DC
  Administered 2014-08-12 – 2014-08-16 (×6): via INTRAVENOUS

## 2014-08-11 MED ORDER — ONDANSETRON HCL 4 MG/2ML IJ SOLN
4.0000 mg | Freq: Four times a day (QID) | INTRAMUSCULAR | Status: DC | PRN
  Administered 2014-08-11 – 2014-08-17 (×4): 4 mg via INTRAVENOUS
  Filled 2014-08-11 (×4): qty 2

## 2014-08-11 MED ORDER — FLUTICASONE PROPIONATE 50 MCG/ACT NA SUSP
1.0000 | Freq: Every day | NASAL | Status: DC
  Administered 2014-08-12 – 2014-08-20 (×9): 1 via NASAL
  Filled 2014-08-11: qty 16

## 2014-08-11 MED ORDER — ACETAMINOPHEN 10 MG/ML IV SOLN
1000.0000 mg | Freq: Three times a day (TID) | INTRAVENOUS | Status: AC
  Administered 2014-08-11 – 2014-08-12 (×3): 1000 mg via INTRAVENOUS
  Filled 2014-08-11 (×4): qty 100

## 2014-08-11 MED ORDER — MORPHINE SULFATE 2 MG/ML IJ SOLN
1.0000 mg | INTRAMUSCULAR | Status: DC | PRN
  Administered 2014-08-11 (×3): 2 mg via INTRAVENOUS
  Filled 2014-08-11 (×3): qty 1

## 2014-08-11 MED ORDER — BISACODYL 10 MG RE SUPP
10.0000 mg | Freq: Two times a day (BID) | RECTAL | Status: DC
  Administered 2014-08-12 – 2014-08-15 (×4): 10 mg via RECTAL
  Filled 2014-08-11 (×5): qty 1

## 2014-08-11 MED ORDER — PANTOPRAZOLE SODIUM 40 MG PO TBEC
40.0000 mg | DELAYED_RELEASE_TABLET | Freq: Every day | ORAL | Status: DC
  Filled 2014-08-11: qty 1

## 2014-08-11 NOTE — ED Notes (Signed)
MD at bedside. 

## 2014-08-11 NOTE — Progress Notes (Signed)
Utilization review complete 

## 2014-08-11 NOTE — Consult Note (Signed)
Reason for Consult: Left Adrenal Mass / Hemorrhage, Post-op Ileus  Referring Physician: Verneita Griffes MD  Kenneth Cline is an 59 y.o. male.   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 Denies h/o refractory / episodic HTN. Up to date on colonoscopy / cancer screening. CXR w/o chest masses. He does have h/o recent should surgery. No contralateral hemorrage / abrnomalites.   2 -  Post-op Ileus - pt w/o BM in 9 days after Rt shoulder surgery. He has  Been on PO narcotics and c/o abd fulness / need to have BM.   PMH sig or HTN ([facility] only), Rt shoulder surgery. His PCP is Delia Chimes MD in [place], [place]  Today "Kenneth Cline" is seen in consultation for above. He is referred by Dr. Verlon Au.   Past Medical History  Diagnosis Date  . Hypertension   . GERD (gastroesophageal reflux disease)   . Dyslipidemia   . Overweight (BMI 25.0-29.9)   . Erectile dysfunction   . Family history of premature coronary artery disease     2 brothers, one sister and father all with CAD.  One sister with valve disease.  . Stevens-Johnson disease     Past Surgical History  Procedure Laterality Date  . Cpet/met  11/27/2011    normal PFT, good effort-no ischemia burden  . Nm myocar perf wall motion  06/28/2006    EF 89% , LV systolic fx norm.  . Rotator cuff repair      Family History  Problem Relation Age of Onset  . Heart attack Father 16    Diet cardiac arrest  . Heart disease Sister 46    triscuspid valve insuff,cardioversion  . Hypertension Sister     She is currently in her 39s as well  . Hypertension Brother   . Heart disease Brother 73    CABG; now age 53  . Ovarian cancer Maternal Grandmother   . Heart attack Brother 21    Died from cardiac arrest  . Hypertension Brother   . Hypertension Sister   . Heart disease Sister 97    CABG plus valve in 06-01-22;   . Sudden death Brother   . Sudden death  Father 7  . Heart failure Sister     Died from complications of CHF following bowel surgery    Social History:  reports that he has never smoked. He has never used smokeless tobacco. He reports that he drinks about 1.5 oz of alcohol per week. He reports that he does not use illicit drugs.  Allergies:  Allergies  Allergen Reactions  . Penicillins     REACTION: rash  . Biaxin [Clarithromycin] Rash  . Keflex [Cephalexin] Rash    Medications: I have reviewed the patient's current medications.  Results for orders placed or performed during the hospital encounter of 08/10/14 (from the past 48 hour(s))  CBC     Status: Abnormal   Collection Time: 08/10/14  8:13 PM  Result Value Ref Range   WBC 5.7 4.0 - 10.5 K/uL   RBC 4.73 4.22 - 5.81 MIL/uL   Hemoglobin 14.3 13.0 - 17.0 g/dL   HCT 39.9 39.0 - 52.0 %   MCV 84.4 78.0 - 100.0 fL   MCH 30.2 26.0 - 34.0 pg   MCHC 35.8 30.0 - 36.0 g/dL   RDW 12.0 11.5 - 15.5 %   Platelets 109 (L) 150 -  400 K/uL    Comment: SPECIMEN CHECKED FOR CLOTS REPEATED TO VERIFY PLATELET COUNT CONFIRMED BY SMEAR   Basic metabolic panel     Status: Abnormal   Collection Time: 08/10/14  8:13 PM  Result Value Ref Range   Sodium 126 (L) 135 - 145 mmol/L   Potassium 3.5 3.5 - 5.1 mmol/L   Chloride 87 (L) 96 - 112 mmol/L   CO2 26 19 - 32 mmol/L   Glucose, Bld 226 (H) 70 - 99 mg/dL   BUN 12 6 - 23 mg/dL   Creatinine, Ser 0.96 0.50 - 1.35 mg/dL   Calcium 9.2 8.4 - 10.5 mg/dL   GFR calc non Af Amer 89 (L) >90 mL/min   GFR calc Af Amer >90 >90 mL/min    Comment: (NOTE) The eGFR has been calculated using the CKD EPI equation. This calculation has not been validated in all clinical situations. eGFR's persistently <90 mL/min signify possible Chronic Kidney Disease.    Anion gap 13 5 - 15  BNP (order ONLY if patient complains of dyspnea/SOB AND you have documented it for THIS visit)     Status: None   Collection Time: 08/10/14  8:13 PM  Result Value Ref Range    B Natriuretic Peptide 22.5 0.0 - 100.0 pg/mL  I-stat troponin, ED     Status: None   Collection Time: 08/10/14  8:23 PM  Result Value Ref Range   Troponin i, poc 0.01 0.00 - 0.08 ng/mL   Comment 3            Comment: Due to the release kinetics of cTnI, a negative result within the first hours of the onset of symptoms does not rule out myocardial infarction with certainty. If myocardial infarction is still suspected, repeat the test at appropriate intervals.   Hepatic function panel     Status: Abnormal   Collection Time: 08/11/14 12:51 AM  Result Value Ref Range   Total Protein 7.4 6.0 - 8.3 g/dL   Albumin 3.4 (L) 3.5 - 5.2 g/dL   AST 26 0 - 37 U/L   ALT 28 0 - 53 U/L   Alkaline Phosphatase 63 39 - 117 U/L   Total Bilirubin 1.6 (H) 0.3 - 1.2 mg/dL   Bilirubin, Direct 0.3 0.0 - 0.5 mg/dL   Indirect Bilirubin 1.3 (H) 0.3 - 0.9 mg/dL  Lipase, blood     Status: None   Collection Time: 08/11/14 12:51 AM  Result Value Ref Range   Lipase 25 11 - 59 U/L  I-stat troponin, ED     Status: None   Collection Time: 08/11/14  1:09 AM  Result Value Ref Range   Troponin i, poc 0.03 0.00 - 0.08 ng/mL   Comment 3            Comment: Due to the release kinetics of cTnI, a negative result within the first hours of the onset of symptoms does not rule out myocardial infarction with certainty. If myocardial infarction is still suspected, repeat the test at appropriate intervals.   Urinalysis, Routine w reflex microscopic     Status: Abnormal   Collection Time: 08/11/14  1:52 AM  Result Value Ref Range   Color, Urine AMBER (A) YELLOW    Comment: BIOCHEMICALS MAY BE AFFECTED BY COLOR   APPearance CLEAR CLEAR   Specific Gravity, Urine 1.026 1.005 - 1.030   pH 6.5 5.0 - 8.0   Glucose, UA 500 (A) NEGATIVE mg/dL   Hgb urine dipstick SMALL (A)  NEGATIVE   Bilirubin Urine NEGATIVE NEGATIVE   Ketones, ur 15 (A) NEGATIVE mg/dL   Protein, ur 100 (A) NEGATIVE mg/dL   Urobilinogen, UA 1.0 0.0 -  1.0 mg/dL   Nitrite NEGATIVE NEGATIVE   Leukocytes, UA NEGATIVE NEGATIVE  Urine microscopic-add on     Status: None   Collection Time: 08/11/14  1:52 AM  Result Value Ref Range   Squamous Epithelial / LPF RARE RARE   RBC / HPF 0-2 <3 RBC/hpf   Bacteria, UA RARE RARE  Protime-INR     Status: None   Collection Time: 08/11/14  6:21 AM  Result Value Ref Range   Prothrombin Time 14.7 11.6 - 15.2 seconds   INR 1.14 0.00 - 1.49  APTT     Status: Abnormal   Collection Time: 08/11/14  6:21 AM  Result Value Ref Range   aPTT 54 (H) 24 - 37 seconds    Comment:        IF BASELINE aPTT IS ELEVATED, SUGGEST PATIENT RISK ASSESSMENT BE USED TO DETERMINE APPROPRIATE ANTICOAGULANT THERAPY.   Type and screen     Status: None   Collection Time: 08/11/14  6:27 AM  Result Value Ref Range   ABO/RH(D) O POS    Antibody Screen NEG    Sample Expiration 08/14/2014   ABO/Rh     Status: None   Collection Time: 08/11/14  6:27 AM  Result Value Ref Range   ABO/RH(D) O POS   Lactate dehydrogenase     Status: None   Collection Time: 08/11/14  6:43 PM  Result Value Ref Range   LDH 204 94 - 250 U/L  Uric acid     Status: None   Collection Time: 08/11/14  6:43 PM  Result Value Ref Range   Uric Acid, Serum 4.6 4.0 - 7.8 mg/dL    Dg Chest 2 View  08/10/2014   CLINICAL DATA:  Left chest pain. Shortness of breath. Symptoms began today. Recent rotator cuff surgery.  EXAM: CHEST  2 VIEW  COMPARISON:  05/08/2013  FINDINGS: Heart size is normal. No mediastinal abnormality. There is volume loss in both lower lobes consistent with atelectasis and possibly pneumonia. No dense consolidation or lobar collapse. No effusions. No acute bony finding.  IMPRESSION: Atelectasis or infiltrate at both lung bases.   Electronically Signed   By: Nelson Chimes M.D.   On: 08/10/2014 21:45   Ct Abdomen Pelvis W Contrast  08/11/2014   CLINICAL DATA:  Initial evaluation for lower back pain radiating to abdomen. Also with nausea,  constipation.  EXAM: CT ABDOMEN AND PELVIS WITH CONTRAST  TECHNIQUE: Multidetector CT imaging of the abdomen and pelvis was performed using the standard protocol following bolus administration of intravenous contrast.  CONTRAST:  93m OMNIPAQUE IOHEXOL 300 MG/ML SOLN, 1070mOMNIPAQUE IOHEXOL 300 MG/ML SOLN  COMPARISON:  None.  FINDINGS: Bibasilar linear opacities within the visualized lung bases are most compatible with atelectasis. Visualized lungs are otherwise clear. No pleural or pericardial fusion.  Subcentimeter hypodensity within the right hepatic lobe noted, too small the characterize, but may reflect a small cyst. Liver is otherwise unremarkable. Few hyperdense stones present within the gallbladder lumen. No evidence for acute cholecystitis. No biliary dilatation. Spleen, right adrenal gland, and pancreas demonstrate a normal contrast enhanced appearance.  There is globular soft tissue density involving the left adrenal gland measuring approximately 2.6 x 3.2 x 3.0 cm. There is adjacent inflammatory stranding.  Kidneys are equal in size with symmetric enhancement. Subcentimeter hypodensity  noted within the interpolar right kidney, too small the characterize, but statistically likely a small cyst. No nephrolithiasis, hydronephrosis, or focal enhancing renal mass.  Stomach within normal limits. No evidence for bowel obstruction. Appendix is normal. Scattered fluid density present throughout most of the colon, suggesting possible diarrheal illness. No abnormal wall thickening, mucosal enhancement, or inflammatory fat stranding seen about the bowels.  Bladder within normal limits.  Prostate unremarkable.  No free air or fluid. No pathologically enlarged intra-abdominal pelvic lymph nodes identified. Normal intravascular enhancement seen throughout the intra-abdominal aorta and its branch vessels. Mild stranding present about the bilateral iliac vessels, of uncertain significance.  No acute osseous abnormality.  No worrisome lytic or blastic osseous lesions.  IMPRESSION: 1. Enlargement of the left adrenal gland, measuring 2.6 x 3.2 x 3.0 cm with adjacent inflammatory stranding. While the unilateral nature of this finding is somewhat unusual, this is favored to reflect an acute adrenal hemorrhage. This may be related to recent surgery/stress or possibly coagulopathy. An underlying mass lesion that has bled is not excluded. A short interval follow-up study in 1 month is recommended to ensure resolution of these findings. This could be performed with either an adrenal mass protocol CT or MRI. 2. Inflammatory soft tissue stranding about the bilateral internal iliac arteries in the lower pelvis. Finding is of uncertain etiology, and may reflect small amount of blood products tracking inferiorly from the hemorrhage in the left adrenal gland. Alternatively, possible vasculitis could also be considered. Attention at follow-up recommended. 3. Intraluminal fluid density within the colonic lumen, suggesting possible diarrheal illness.   Electronically Signed   By: Jeannine Boga M.D.   On: 08/11/2014 06:03    Review of Systems  Constitutional: Positive for malaise/fatigue. Negative for fever and chills.  HENT: Negative.   Respiratory: Negative.   Gastrointestinal: Positive for nausea, abdominal pain and constipation. Negative for vomiting and diarrhea.  Genitourinary: Positive for flank pain. Negative for dysuria, urgency, frequency and hematuria.  Musculoskeletal: Positive for joint pain.  Skin: Negative.   Neurological: Negative.   Endo/Heme/Allergies: Bruises/bleeds easily.  Psychiatric/Behavioral: Negative.    Blood pressure 161/82, pulse 87, temperature 97.4 F (36.3 C), temperature source Oral, resp. rate 18, height 5' 9" (1.753 m), weight 91.627 kg (202 lb), SpO2 97 %. Physical Exam  Constitutional: He is oriented to person, place, and time. He appears well-developed and well-nourished.  Wife at bedside   HENT:  Head: Normocephalic.  NGT c/d/i with bilious output, mild.   Eyes: Pupils are equal, round, and reactive to light.  Neck: Normal range of motion.  Cardiovascular: Normal rate.   Respiratory: Effort normal.  GI: Soft.  Genitourinary: Penis normal.  Testes w/o masses  Musculoskeletal:  RUE splint in place. Rt shoulder recetn incision c/d/i.   Neurological: He is alert and oriented to person, place, and time.  Skin: Skin is warm and dry.  Psychiatric: His behavior is normal. Thought content normal.    Assessment/Plan:  1 -  Left Adrenal Mass / Hemorrhage - rare presentation. DDX stress related hemorrhage (unlikely as unilateral), bleed from underlying mas (adenoma v. Carcinoma v. Met). Denies recent flank trauma. He is up to date on routine cancer screenings. Rec endocrine eval (ACTH stim test, plasma metanephrines, etc..), this has been ordered and repeat adrenal protocol CT tomorrow, also orered,  to help r/o enhancing mass or development of sig hematoma or contralateral bleed. Will also likely need f/u imaging as outpatient. Discussed with pt that if sig concern for neoplasm (  enhancing/solid/bleedind mass) he will likely need unilateral adrenalectomy in elective setting.  2 -  Post-op Ileus - impressive case. Pt knows to minimize narcotics, maximize non-narcotic pain control. Would consider BID ducolax SPP until BM. As well.   Will follow, call with questions anytime.   ,  08/11/2014, 8:30 PM

## 2014-08-11 NOTE — ED Notes (Signed)
CT called to inquire about pt's scan.  

## 2014-08-11 NOTE — H&P (Addendum)
[facility] History and Physical  Kenneth Cline GEX:[id number] DOB: Jan 27, 1956 DOA: 08/10/2014  Referring physician: ED PCP: Delia Chimes, NP  Specialists:  Urology-Dr. Alexis Frock to [facility] tonight  Chief Complaint: Severe abd pain  59 y/o ?Htn, prior Stven's johnson, GERD, prior history of cyclic neutropenia followed by Dr. Humphrey Rolls [2011] BPH, Stress ? Treadmill stress test, Htn recent surgery on right shoulder 08/03/14-injured his shoulder 03/30/2014 was doing fairly until 2/7 ago. He started to feel horrible with intermittent abdominal pain, chills, no subjective fever, he has not passed a bowel movement since 4/10 He has had worsening pain over the past 24-48 hours with nausea but no vomiting. He denies any specific chest pain-like features concerning for ACS. He he has no arm radiation he has no neck radiation. At baseline he is a functional gentleman who until his fall and injury on his right shoulder was working with [employer] He has never had any abdominal pain like this before and this was 10/10 pain. It was constant and it worsened over the course of the past 12 hours to the point that he came to [facility] ED 4/19  No rash, no ill contacts, no prior abdominal surgery, no blurred vision, no double vision, no unilateral weakness, no fall or trauma,  Patient had a workup in the emergency room consisting of Be met showing sodium 126, chloride 87 right now 3 colostomy in [place], random glucose 226 without a history of diabetes mellitus, Indirect bili 1.3, total bili 1.6, albumin 3.4, point-of-care troponin 0.04 APTT 54   CT abdomen pelvis = enlargement left adrenal gland 2.6 3. 2X3.0 with adjacent inflammatory stranding Underlying mass lesion that has bled is not excluded-recommended adrenal mass CT protocol Inflammatory soft tissue stranding around bilateral internal iliac arteries in the lower pelvis finding of uncertain etiology-may reflect blood-? Vasculitis Intraluminal  fluid density at long colonic lumen suggestive of possible diarrhea  Hemoglobin 14.3, hematocrit 39.9, platelets 109,  Past Medical History  Diagnosis Date  . Hypertension   . GERD (gastroesophageal reflux disease)   . Dyslipidemia   . Overweight (BMI 25.0-29.9)   . Erectile dysfunction   . Family history of premature coronary artery disease     2 brothers, one sister and father all with CAD.  One sister with valve disease.  . Stevens-Johnson disease    Past Surgical History  Procedure Laterality Date  . Cpet/met  11/27/2011    normal PFT, good effort-no ischemia burden  . Nm myocar perf wall motion  06/28/2006    EF 01% , LV systolic fx norm.  . Rotator cuff repair     Social History:  History   Social History Narrative   He is a married father of 2 with 3 stepchildren.  He has 3 grandchildren his own and 3 step grandchildren.  He works as a Dealer for [employer].  He lives with his wife, [name], of 8 years.  He does not, and never did smoke.  He takes occasional alcohol beverage but nothing significant.  He does not do routine exercise, but does walk a lot at work.    Allergies  Allergen Reactions  . Penicillins     REACTION: rash  . Biaxin [Clarithromycin] Rash  . Keflex [Cephalexin] Rash    Family History  Problem Relation Age of Onset  . Heart attack Father 44    Diet cardiac arrest  . Heart disease Sister 80    triscuspid valve insuff,cardioversion  . Hypertension Sister  She is currently in her 81s as well  . Hypertension Brother   . Heart disease Brother 89    CABG; now age 62  . Ovarian cancer Maternal Grandmother   . Heart attack Brother 51    Died from cardiac arrest  . Hypertension Brother   . Hypertension Sister   . Heart disease Sister 78    CABG plus valve in 07-04-22;   . Sudden death Brother   . Sudden death Father 27  . Heart failure Sister     Died from complications of CHF following bowel surgery    Prior to Admission medications    Medication Sig Start Date End Date Taking? Authorizing Provider  cetirizine (ZYRTEC) 10 MG tablet Take 10 mg by mouth daily.   Yes Historical Provider, MD  ezetimibe (ZETIA) 10 MG tablet Take 1 tablet (10 mg total) by mouth daily. 12/31/13  Yes Leonie Man, MD  HYDROcodone-acetaminophen ([place]) 7.5-325 MG per tablet Take 1 tablet by mouth every 4 (four) hours as needed for moderate pain.   Yes Historical Provider, MD  HYDROcodone-acetaminophen (NORCO/VICODIN) 5-325 MG per tablet Take 1 tablet by mouth every 2 (two) hours as needed for moderate pain.   Yes Historical Provider, MD  lisinopril-hydrochlorothiazide (PRINZIDE,ZESTORETIC) 20-12.5 MG per tablet Take 1 tablet by mouth daily. 12/31/13  Yes Leonie Man, MD  loperamide (IMODIUM A-D) 2 MG tablet 2 now and one after each loose stool q1h prn max 8 in 24 hours 04/06/13  Yes Roselee Culver, MD  NASONEX 50 MCG/ACT nasal spray Place 2 sprays into the nose daily as needed (allergies).  12/09/13  Yes Historical Provider, MD  oxyCODONE (ROXICODONE) 15 MG immediate release tablet Take 15 mg by mouth every 4 (four) hours as needed for pain.   Yes Historical Provider, MD  pantoprazole (PROTONIX) 40 MG tablet Take 1 tablet (40 mg total) by mouth daily. 12/31/13  Yes Leonie Man, MD  sildenafil (VIAGRA) 100 MG tablet Take 100 mg by mouth daily as needed for erectile dysfunction.   Yes Historical Provider, MD  aspirin EC 81 MG tablet Take 81 mg by mouth daily.    Historical Provider, MD  chlorpheniramine-HYDROcodone (TUSSIONEX PENNKINETIC ER) 10-8 MG/5ML LQCR Take 5 mLs by mouth every 12 (twelve) hours as needed. Patient not taking: Reported on 08/11/2014 04/06/13   Roselee Culver, MD  fish oil-omega-3 fatty acids 1000 MG capsule Take 2 g by mouth daily.     Historical Provider, MD  tadalafil (CIALIS) 20 MG tablet Take 1 tablet (20 mg total) by mouth daily as needed for erectile dysfunction. Patient not taking: Reported on 08/11/2014 01/14/14    Leonie Man, MD   Physical Exam: Filed Vitals:   08/11/14 0730 08/11/14 0800 08/11/14 0916 08/11/14 1245  BP: 152/87 158/82 169/92 161/82  Pulse: 98 95 89 87  Temp:   98 F (36.7 C) 97.4 F (36.3 C)  TempSrc:   Oral Oral  Resp: 25 19 18 18   Height:      Weight:      SpO2: 95% 95% 97% 97%    EOMI NCAT pleasant but sleepy as just received morphine Mild tachycardia No JVD S1-S2 no murmur rub or gallop No rales no rhonchi Mild left CVA tenderness No abdominal rebound or guarding Cranial nerves intact Range of motion to right shoulder is limited because of prior surgery  Labs on Admission:  Basic Metabolic Panel:  Recent Labs Lab 08/10/14 05-Jul-2011  NA 126*  K 3.5  CL 87*  CO2 26  GLUCOSE 226*  BUN 12  CREATININE 0.96  CALCIUM 9.2   Liver Function Tests:  Recent Labs Lab 08/11/14 0051  AST 26  ALT 28  ALKPHOS 63  BILITOT 1.6*  PROT 7.4  ALBUMIN 3.4*    Recent Labs Lab 08/11/14 0051  LIPASE 25   No results for input(s): AMMONIA in the last 168 hours. CBC:  Recent Labs Lab 08/10/14 2013  WBC 5.7  HGB 14.3  HCT 39.9  MCV 84.4  PLT 109*   Cardiac Enzymes: No results for input(s): CKTOTAL, CKMB, CKMBINDEX, TROPONINI in the last 168 hours.  BNP (last 3 results)  Recent Labs  08/10/14 2013  BNP 22.5    ProBNP (last 3 results) No results for input(s): PROBNP in the last 8760 hours.  CBG: No results for input(s): GLUCAP in the last 168 hours.  Radiological Exams on Admission: Dg Chest 2 View  08/10/2014   CLINICAL DATA:  Left chest pain. Shortness of breath. Symptoms began today. Recent rotator cuff surgery.  EXAM: CHEST  2 VIEW  COMPARISON:  05/08/2013  FINDINGS: Heart size is normal. No mediastinal abnormality. There is volume loss in both lower lobes consistent with atelectasis and possibly pneumonia. No dense consolidation or lobar collapse. No effusions. No acute bony finding.  IMPRESSION: Atelectasis or infiltrate at both lung bases.    Electronically Signed   By: Nelson Chimes M.D.   On: 08/10/2014 21:45   Ct Abdomen Pelvis W Contrast  08/11/2014   CLINICAL DATA:  Initial evaluation for lower back pain radiating to abdomen. Also with nausea, constipation.  EXAM: CT ABDOMEN AND PELVIS WITH CONTRAST  TECHNIQUE: Multidetector CT imaging of the abdomen and pelvis was performed using the standard protocol following bolus administration of intravenous contrast.  CONTRAST:  25m OMNIPAQUE IOHEXOL 300 MG/ML SOLN, 1066mOMNIPAQUE IOHEXOL 300 MG/ML SOLN  COMPARISON:  None.  FINDINGS: Bibasilar linear opacities within the visualized lung bases are most compatible with atelectasis. Visualized lungs are otherwise clear. No pleural or pericardial fusion.  Subcentimeter hypodensity within the right hepatic lobe noted, too small the characterize, but may reflect a small cyst. Liver is otherwise unremarkable. Few hyperdense stones present within the gallbladder lumen. No evidence for acute cholecystitis. No biliary dilatation. Spleen, right adrenal gland, and pancreas demonstrate a normal contrast enhanced appearance.  There is globular soft tissue density involving the left adrenal gland measuring approximately 2.6 x 3.2 x 3.0 cm. There is adjacent inflammatory stranding.  Kidneys are equal in size with symmetric enhancement. Subcentimeter hypodensity noted within the interpolar right kidney, too small the characterize, but statistically likely a small cyst. No nephrolithiasis, hydronephrosis, or focal enhancing renal mass.  Stomach within normal limits. No evidence for bowel obstruction. Appendix is normal. Scattered fluid density present throughout most of the colon, suggesting possible diarrheal illness. No abnormal wall thickening, mucosal enhancement, or inflammatory fat stranding seen about the bowels.  Bladder within normal limits.  Prostate unremarkable.  No free air or fluid. No pathologically enlarged intra-abdominal pelvic lymph nodes identified.  Normal intravascular enhancement seen throughout the intra-abdominal aorta and its branch vessels. Mild stranding present about the bilateral iliac vessels, of uncertain significance.  No acute osseous abnormality. No worrisome lytic or blastic osseous lesions.  IMPRESSION: 1. Enlargement of the left adrenal gland, measuring 2.6 x 3.2 x 3.0 cm with adjacent inflammatory stranding. While the unilateral nature of this finding is somewhat unusual, this is favored to reflect an  acute adrenal hemorrhage. This may be related to recent surgery/stress or possibly coagulopathy. An underlying mass lesion that has bled is not excluded. A short interval follow-up study in 1 month is recommended to ensure resolution of these findings. This could be performed with either an adrenal mass protocol CT or MRI. 2. Inflammatory soft tissue stranding about the bilateral internal iliac arteries in the lower pelvis. Finding is of uncertain etiology, and may reflect small amount of blood products tracking inferiorly from the hemorrhage in the left adrenal gland. Alternatively, possible vasculitis could also be considered. Attention at follow-up recommended. 3. Intraluminal fluid density within the colonic lumen, suggesting possible diarrheal illness.   Electronically Signed   By: Jeannine Boga M.D.   On: 08/11/2014 06:03   EKG: Independently reviewed. NSR, NO st-t changes  Assessment/Plan Active Problems:   Adrenal hemorrhage  A/p  Admitted-hold multiple PO meds for now including sildenafil, oxycodone 15, Norco, lisinopril HCTZ, aspirin 81, Septra  Patient will need adrenal CT protocol to rule out mass-d/w Dr. Tresa Moore [facility]rology who recommneds holding off on ordering for now-he will see the patient in consult. Risk factor bleed=priro h/o Cyclic Neutropenia--NOw not beign Rx-Bili elevated, platelets 109--get LDH, Uric Acid, Hapto We will keep NG tube in situ. CXR=atelectasis--As no current f/Chills-Hold Abx right  now Morphine 1-2 mg IV when necessary prn severe pain  Nita Sells [facility] Pager [phone]  If 7PM-7AM, please contact night-coverage [email or web address] Password [email or web address] 08/11/2014, 2:16 PM

## 2014-08-11 NOTE — ED Provider Notes (Signed)
CSN: [id number]     Arrival date & time 08/10/14  07-06-14 History   First MD Initiated Contact with Patient 08/11/14 0020     This chart was scribed for Ivor Costa, MD by Forrestine Him, ED Scribe. This patient was seen in room A03C/A03C and the patient's care was started 12:26 AM.   Chief Complaint  Patient presents with  . Chest Pain  . Constipation  . Back Pain   HPI  HPI Comments: Angie Hogg is a 59 y.o. male with a PMHx of HTN, stevens-Johnson disease, and GERD who presents to the Emergency Department complaining of constant, ongoing, lower back pain that radiates to abdomen. Pt also reports constipation, nausea, diaphoresis, and shortness of breath. Mr. Kluth underwent shoulder surgery to repair a torn rotator cuff last week and was sent home with Oxycodone to help manage symptoms. Last dose given at 8:00 PM this evening. Last bowel movements before surgery. Pt admits he has not been able to pass any gas since Sunday 4/17. He denies any fever, cough, or chills. Pt with known allergies to Biaxin and Penicillins.   Past Medical History  Diagnosis Date  . Hypertension   . GERD (gastroesophageal reflux disease)   . Dyslipidemia   . Overweight (BMI 25.0-29.9)   . Erectile dysfunction   . Family history of premature coronary artery disease     2 brothers, one sister and father all with CAD.  One sister with valve disease.  . Stevens-Johnson disease    Past Surgical History  Procedure Laterality Date  . Cpet/met  11/27/2011    normal PFT, good effort-no ischemia burden  . Nm myocar perf wall motion  06/28/2006    EF 09% , LV systolic fx norm.  . Rotator cuff repair     Family History  Problem Relation Age of Onset  . Heart attack Father 15    Diet cardiac arrest  . Heart disease Sister 66    triscuspid valve insuff,cardioversion  . Hypertension Sister     She is currently in her 51s as well  . Hypertension Brother   . Heart disease Brother 70    CABG; now age 9  . Ovarian  cancer Maternal Grandmother   . Heart attack Brother 13    Died from cardiac arrest  . Hypertension Brother   . Hypertension Sister   . Heart disease Sister 52    CABG plus valve in 07/06/22;   . Sudden death Brother   . Sudden death Father 34  . Heart failure Sister     Died from complications of CHF following bowel surgery   History  Substance Use Topics  . Smoking status: Never Smoker   . Smokeless tobacco: Not on file  . Alcohol Use: 1.5 oz/week    3 Standard drinks or equivalent per week    Review of Systems  Constitutional: Positive for diaphoresis, activity change and appetite change. Negative for fever and chills.  Respiratory: Positive for shortness of breath. Negative for cough.   Cardiovascular: Positive for chest pain. Negative for palpitations and leg swelling.  Gastrointestinal: Positive for nausea, abdominal pain and constipation. Negative for vomiting and diarrhea.  Genitourinary: Positive for difficulty urinating. Negative for dysuria, frequency, hematuria and flank pain.  Musculoskeletal: Positive for back pain. Negative for neck pain and neck stiffness.  Skin: Negative for rash and wound.  Neurological: Negative for dizziness, syncope, weakness, light-headedness, numbness and headaches.  Psychiatric/Behavioral: Negative for confusion.  All other systems reviewed and  are negative.     Allergies  Penicillins; Biaxin; and Keflex  Home Medications   Prior to Admission medications   Medication Sig Start Date End Date Taking? Authorizing Provider  cetirizine (ZYRTEC) 10 MG tablet Take 10 mg by mouth daily.   Yes Historical Provider, MD  ezetimibe (ZETIA) 10 MG tablet Take 1 tablet (10 mg total) by mouth daily. 12/31/13  Yes Leonie Man, MD  HYDROcodone-acetaminophen ([place]) 7.5-325 MG per tablet Take 1 tablet by mouth every 4 (four) hours as needed for moderate pain.   Yes Historical Provider, MD  HYDROcodone-acetaminophen (NORCO/VICODIN) 5-325 MG per tablet  Take 1 tablet by mouth every 2 (two) hours as needed for moderate pain.   Yes Historical Provider, MD  lisinopril-hydrochlorothiazide (PRINZIDE,ZESTORETIC) 20-12.5 MG per tablet Take 1 tablet by mouth daily. 12/31/13  Yes Leonie Man, MD  loperamide (IMODIUM A-D) 2 MG tablet 2 now and one after each loose stool q1h prn max 8 in 24 hours 04/06/13  Yes Roselee Culver, MD  NASONEX 50 MCG/ACT nasal spray Place 2 sprays into the nose daily as needed (allergies).  12/09/13  Yes Historical Provider, MD  oxyCODONE (ROXICODONE) 15 MG immediate release tablet Take 15 mg by mouth every 4 (four) hours as needed for pain.   Yes Historical Provider, MD  pantoprazole (PROTONIX) 40 MG tablet Take 1 tablet (40 mg total) by mouth daily. 12/31/13  Yes Leonie Man, MD  sildenafil (VIAGRA) 100 MG tablet Take 100 mg by mouth daily as needed for erectile dysfunction.   Yes Historical Provider, MD  aspirin EC 81 MG tablet Take 81 mg by mouth daily.    Historical Provider, MD  chlorpheniramine-HYDROcodone (TUSSIONEX PENNKINETIC ER) 10-8 MG/5ML LQCR Take 5 mLs by mouth every 12 (twelve) hours as needed. Patient not taking: Reported on 08/11/2014 04/06/13   Roselee Culver, MD  fish oil-omega-3 fatty acids 1000 MG capsule Take 2 g by mouth daily.     Historical Provider, MD  tadalafil (CIALIS) 20 MG tablet Take 1 tablet (20 mg total) by mouth daily as needed for erectile dysfunction. Patient not taking: Reported on 08/11/2014 01/14/14   Leonie Man, MD   Triage Vitals: BP 151/78 mmHg  Pulse 102  Temp(Src) 97.9 F (36.6 C) (Oral)  Resp 16  Ht 5' 9"  (1.753 m)  Wt 202 lb (91.627 kg)  BMI 29.82 kg/m2  SpO2 96%   Physical Exam  Constitutional: He is oriented to person, place, and time. He appears well-developed and well-nourished. No distress.  HENT:  Head: Normocephalic and atraumatic.  Mouth/Throat: Oropharynx is clear and moist.  Eyes: EOM are normal. Pupils are equal, round, and reactive to light.   Neck: Normal range of motion. Neck supple.  Cardiovascular: Normal rate and regular rhythm.  Exam reveals no gallop and no friction rub.   No murmur heard. Pulmonary/Chest: Effort normal and breath sounds normal. No respiratory distress. He has no wheezes. He has no rales. He exhibits no tenderness.  Abdominal: Soft. Bowel sounds are normal. He exhibits distension. He exhibits no mass. There is tenderness. There is no rebound and no guarding.  Diffuse abdominal tenderness with palpation. No rebound or guarding.  Musculoskeletal: Normal range of motion. He exhibits no edema or tenderness.  No thoracic or lumbar midline tenderness to palpation. Mild bilateral CVA tenderness.  Right shoulder surgical incision appears well healing. No evidence of warmth, erythema or swelling.  Neurological: He is alert and oriented to person, place, and time.  5/5 motor in all extremities. Sensation is fully intact.  Skin: Skin is warm and dry. No rash noted. No erythema.  Psychiatric: He has a normal mood and affect. His behavior is normal.  Nursing note and vitals reviewed.   ED Course  Procedures (including critical care time)  DIAGNOSTIC STUDIES: Oxygen Saturation is 97% on RA, Normal by my interpretation.    COORDINATION OF CARE: 6:38 AM-Discussed treatment plan with pt at bedside and pt agreed to plan.     Labs Review Labs Reviewed  CBC - Abnormal; Notable for the following:    Platelets 109 (*)    All other components within normal limits  BASIC METABOLIC PANEL - Abnormal; Notable for the following:    Sodium 126 (*)    Chloride 87 (*)    Glucose, Bld 226 (*)    GFR calc non Af Amer 89 (*)    All other components within normal limits  HEPATIC FUNCTION PANEL - Abnormal; Notable for the following:    Albumin 3.4 (*)    Total Bilirubin 1.6 (*)    Indirect Bilirubin 1.3 (*)    All other components within normal limits  URINALYSIS, ROUTINE W REFLEX MICROSCOPIC - Abnormal; Notable for the  following:    Color, Urine AMBER (*)    Glucose, UA 500 (*)    Hgb urine dipstick SMALL (*)    Ketones, ur 15 (*)    Protein, ur 100 (*)    All other components within normal limits  BRAIN NATRIURETIC PEPTIDE  LIPASE, BLOOD  URINE MICROSCOPIC-ADD ON  PROTIME-INR  APTT  I-STAT TROPOININ, ED  I-STAT TROPOININ, ED  TYPE AND SCREEN    Imaging Review Dg Chest 2 View  08/10/2014   CLINICAL DATA:  Left chest pain. Shortness of breath. Symptoms began today. Recent rotator cuff surgery.  EXAM: CHEST  2 VIEW  COMPARISON:  05/08/2013  FINDINGS: Heart size is normal. No mediastinal abnormality. There is volume loss in both lower lobes consistent with atelectasis and possibly pneumonia. No dense consolidation or lobar collapse. No effusions. No acute bony finding.  IMPRESSION: Atelectasis or infiltrate at both lung bases.   Electronically Signed   By: Nelson Chimes M.D.   On: 08/10/2014 21:45   Ct Abdomen Pelvis W Contrast  08/11/2014   CLINICAL DATA:  Initial evaluation for lower back pain radiating to abdomen. Also with nausea, constipation.  EXAM: CT ABDOMEN AND PELVIS WITH CONTRAST  TECHNIQUE: Multidetector CT imaging of the abdomen and pelvis was performed using the standard protocol following bolus administration of intravenous contrast.  CONTRAST:  76m OMNIPAQUE IOHEXOL 300 MG/ML SOLN, 1048mOMNIPAQUE IOHEXOL 300 MG/ML SOLN  COMPARISON:  None.  FINDINGS: Bibasilar linear opacities within the visualized lung bases are most compatible with atelectasis. Visualized lungs are otherwise clear. No pleural or pericardial fusion.  Subcentimeter hypodensity within the right hepatic lobe noted, too small the characterize, but may reflect a small cyst. Liver is otherwise unremarkable. Few hyperdense stones present within the gallbladder lumen. No evidence for acute cholecystitis. No biliary dilatation. Spleen, right adrenal gland, and pancreas demonstrate a normal contrast enhanced appearance.  There is globular  soft tissue density involving the left adrenal gland measuring approximately 2.6 x 3.2 x 3.0 cm. There is adjacent inflammatory stranding.  Kidneys are equal in size with symmetric enhancement. Subcentimeter hypodensity noted within the interpolar right kidney, too small the characterize, but statistically likely a small cyst. No nephrolithiasis, hydronephrosis, or focal enhancing renal mass.  Stomach within  normal limits. No evidence for bowel obstruction. Appendix is normal. Scattered fluid density present throughout most of the colon, suggesting possible diarrheal illness. No abnormal wall thickening, mucosal enhancement, or inflammatory fat stranding seen about the bowels.  Bladder within normal limits.  Prostate unremarkable.  No free air or fluid. No pathologically enlarged intra-abdominal pelvic lymph nodes identified. Normal intravascular enhancement seen throughout the intra-abdominal aorta and its branch vessels. Mild stranding present about the bilateral iliac vessels, of uncertain significance.  No acute osseous abnormality. No worrisome lytic or blastic osseous lesions.  IMPRESSION: 1. Enlargement of the left adrenal gland, measuring 2.6 x 3.2 x 3.0 cm with adjacent inflammatory stranding. While the unilateral nature of this finding is somewhat unusual, this is favored to reflect an acute adrenal hemorrhage. This may be related to recent surgery/stress or possibly coagulopathy. An underlying mass lesion that has bled is not excluded. A short interval follow-up study in 1 month is recommended to ensure resolution of these findings. This could be performed with either an adrenal mass protocol CT or MRI. 2. Inflammatory soft tissue stranding about the bilateral internal iliac arteries in the lower pelvis. Finding is of uncertain etiology, and may reflect small amount of blood products tracking inferiorly from the hemorrhage in the left adrenal gland. Alternatively, possible vasculitis could also be  considered. Attention at follow-up recommended. 3. Intraluminal fluid density within the colonic lumen, suggesting possible diarrheal illness.   Electronically Signed   By: Jeannine Boga M.D.   On: 08/11/2014 06:03     EKG Interpretation None     ED ECG REPORT   Date: 08/11/2014  Rate:87  Rhythm: normal sinus rhythm  QRS Axis: normal  Intervals: normal  ST/T Wave abnormalities: nonspecific t wave changes  Conduction Disutrbances:none  Narrative Interpretation:   Old EKG Reviewed: none available  I have personally reviewed the EKG tracing and agree with the computerized printout as noted. MDM   Final diagnoses:  Abdominal pain  Adrenal hemorrhage  Intractable vomiting with nausea, vomiting of unspecified type    I personally performed the services described in this documentation, which was scribed in my presence. The recorded information has been reviewed and is accurate.  Patient required multiple doses of antiemetics with persistent nausea and vomiting. NG tube was placed with some improvement of symptoms. CT without some left adrenal mass likely hemorrhage. Discussed with admitting physician results and will admit to step down bed.  Julianne Rice, MD 08/11/14 [phone]

## 2014-08-11 NOTE — Progress Notes (Signed)
Attempted to call report to the ED, currently no answer.

## 2014-08-11 NOTE — Progress Notes (Signed)
Kenneth Cline is a 59 y.o. male patient admitted from ED awake, alert - oriented  X 4 - no acute distress noted.  VSS - Blood pressure 169/92, pulse 89, temperature 98 F (36.7 C), temperature source Oral, resp. rate 18, height 5\' 9"  (1.753 m), weight 91.627 kg (202 lb), SpO2 97 %.    IV in place, occlusive dsg intact without redness.  Orientation to room, and floor completed with information packet given to patient/family.    Admission INP armband ID verified with patient/family, and in place.   SR up x 2, fall assessment complete, with patient and family able to verbalize understanding of risk associated with falls, and verbalized understanding to call nsg before up out of bed.  Call light within reach, patient able to voice, and demonstrate understanding.  Skin, clean-dry- intact without evidence of bruising, or skin tears.   No evidence of skin break down noted on exam.     Will cont to eval and treat per MD orders.  Henriette Combs, [place] 08/11/2014 9:24 AM

## 2014-08-11 NOTE — ED Notes (Signed)
PO contrast  Complete; CT notified

## 2014-08-11 NOTE — ED Notes (Signed)
Pt aware of urine sample needed. Pt unable to go at this time. 

## 2014-08-12 ENCOUNTER — Encounter (HOSPITAL_COMMUNITY): Payer: Self-pay

## 2014-08-12 ENCOUNTER — Inpatient Hospital Stay (HOSPITAL_COMMUNITY): Payer: Worker's Compensation

## 2014-08-12 DIAGNOSIS — E2749 Other adrenocortical insufficiency: Principal | ICD-10-CM

## 2014-08-12 DIAGNOSIS — I10 Essential (primary) hypertension: Secondary | ICD-10-CM

## 2014-08-12 DIAGNOSIS — K5901 Slow transit constipation: Secondary | ICD-10-CM

## 2014-08-12 DIAGNOSIS — E871 Hypo-osmolality and hyponatremia: Secondary | ICD-10-CM

## 2014-08-12 LAB — HAPTOGLOBIN: Haptoglobin: 467 mg/dL — ABNORMAL HIGH (ref 34–200)

## 2014-08-12 LAB — GLUCOSE, CAPILLARY
GLUCOSE-CAPILLARY: 65 mg/dL — AB (ref 70–99)
GLUCOSE-CAPILLARY: 88 mg/dL (ref 70–99)

## 2014-08-12 LAB — CORTISOL: CORTISOL PLASMA: 12.2 ug/dL

## 2014-08-12 MED ORDER — IOHEXOL 300 MG/ML  SOLN
100.0000 mL | Freq: Once | INTRAMUSCULAR | Status: AC | PRN
  Administered 2014-08-12: 100 mL via INTRAVENOUS

## 2014-08-12 MED ORDER — COSYNTROPIN 0.25 MG IJ SOLR
0.2500 mg | Freq: Once | INTRAMUSCULAR | Status: AC
  Administered 2014-08-13: 0.25 mg via INTRAVENOUS
  Filled 2014-08-12: qty 0.25

## 2014-08-12 MED ORDER — TEMAZEPAM 15 MG PO CAPS
15.0000 mg | ORAL_CAPSULE | Freq: Every evening | ORAL | Status: DC | PRN
Start: 2014-08-12 — End: 2014-08-20
  Administered 2014-08-14 – 2014-08-19 (×3): 15 mg via ORAL
  Filled 2014-08-12: qty 2
  Filled 2014-08-12 (×2): qty 1

## 2014-08-12 MED ORDER — HYDRALAZINE HCL 20 MG/ML IJ SOLN
10.0000 mg | Freq: Four times a day (QID) | INTRAMUSCULAR | Status: DC | PRN
  Administered 2014-08-15: 10 mg via INTRAVENOUS
  Filled 2014-08-12: qty 1

## 2014-08-12 MED ORDER — INSULIN ASPART 100 UNIT/ML ~~LOC~~ SOLN: 0.0000 [IU] | Freq: Three times a day (TID) | SUBCUTANEOUS | Status: DC

## 2014-08-12 MED ORDER — FLEET ENEMA 7-19 GM/118ML RE ENEM
1.0000 | ENEMA | Freq: Every day | RECTAL | Status: DC | PRN
  Administered 2014-08-12: 1 via RECTAL
  Filled 2014-08-12 (×2): qty 1

## 2014-08-12 MED ORDER — PANTOPRAZOLE SODIUM 40 MG IV SOLR
40.0000 mg | Freq: Every day | INTRAVENOUS | Status: DC
  Administered 2014-08-12: 40 mg via INTRAVENOUS
  Filled 2014-08-12 (×2): qty 40

## 2014-08-12 MED ORDER — INSULIN ASPART 100 UNIT/ML ~~LOC~~ SOLN: 0.0000 [IU] | Freq: Every day | SUBCUTANEOUS | Status: DC

## 2014-08-12 MED ORDER — MAGNESIUM HYDROXIDE 400 MG/5ML PO SUSP
960.0000 mL | Freq: Once | ORAL | Status: AC
  Administered 2014-08-12: 960 mL via RECTAL
  Filled 2014-08-12: qty 240

## 2014-08-12 NOTE — Progress Notes (Signed)
RN spoke with someone in main lab concerning ACTH stimulation draw in the morning. Patient may eat now but must stay NPO after. Ice chips approved as well.

## 2014-08-12 NOTE — Progress Notes (Signed)
PROGRESS NOTE  Kenneth Cline NAT:[id number] DOB: [birth year] DOA: 08/10/2014 PCP: Delia Chimes, NP  Assessment/Plan: Adrenal hemmorrhage -repeat CT scan shows b/l adrenal hemm -3.4 x 2.3 cm right adrenal hemorrhage, new. -3.4 x 2.5 cm left adrenal hemorrhage, increased. ACTH stim test, plasma metanephrines pending  Constipation from narcotic use suppository -enema -limit narcotics  N/V-- NG was placed in ER, will d/c once bowel moving  HTN- PRN Medications until taking PO  Hyponatremia -IVF  Elevated blood sugars -SSI -monitor  Thrombocytopenia -?etiology   Code Status: full Family Communication: wife at bedside Disposition Plan:   Consultants:  urology  Procedures:     HPI/Subjective: Still no BM  Objective: Filed Vitals:   08/12/14 0600  BP: 159/84  Pulse: 94  Temp: 98.2 F (36.8 C)  Resp: 16    Intake/Output Summary (Last 24 hours) at 08/12/14 1211 Last data filed at 08/12/14 0908  Gross per 24 hour  Intake 400.83 ml  Output    900 ml  Net -499.17 ml   Filed Weights   08/10/14 2005  Weight: 91.627 kg (202 lb)    Exam:   General:  A+Ox3, NAD- NG tube in place  Cardiovascular: rrr  Respiratory: clear  Abdomen: +BS, soft  Musculoskeletal: no edema   Data Reviewed: Basic Metabolic Panel:  Recent Labs Lab 08/10/14 2013  NA 126*  K 3.5  CL 87*  CO2 26  GLUCOSE 226*  BUN 12  CREATININE 0.96  CALCIUM 9.2   Liver Function Tests:  Recent Labs Lab 08/11/14 0051  AST 26  ALT 28  ALKPHOS 63  BILITOT 1.6*  PROT 7.4  ALBUMIN 3.4*    Recent Labs Lab 08/11/14 0051  LIPASE 25   No results for input(s): AMMONIA in the last 168 hours. CBC:  Recent Labs Lab 08/10/14 2013  WBC 5.7  HGB 14.3  HCT 39.9  MCV 84.4  PLT 109*   Cardiac Enzymes: No results for input(s): CKTOTAL, CKMB, CKMBINDEX, TROPONINI in the last 168 hours. BNP (last 3 results)  Recent Labs  08/10/14 2013  BNP 22.5    ProBNP (last 3  results) No results for input(s): PROBNP in the last 8760 hours.  CBG: No results for input(s): GLUCAP in the last 168 hours.  No results found for this or any previous visit (from the past 240 hour(s)).   Studies: Dg Chest 2 View  08/10/2014   CLINICAL DATA:  Left chest pain. Shortness of breath. Symptoms began today. Recent rotator cuff surgery.  EXAM: CHEST  2 VIEW  COMPARISON:  05/08/2013  FINDINGS: Heart size is normal. No mediastinal abnormality. There is volume loss in both lower lobes consistent with atelectasis and possibly pneumonia. No dense consolidation or lobar collapse. No effusions. No acute bony finding.  IMPRESSION: Atelectasis or infiltrate at both lung bases.   Electronically Signed   By: Nelson Chimes M.D.   On: 08/10/2014 21:45   Ct Abdomen Pelvis W Contrast  08/11/2014   CLINICAL DATA:  Initial evaluation for lower back pain radiating to abdomen. Also with nausea, constipation.  EXAM: CT ABDOMEN AND PELVIS WITH CONTRAST  TECHNIQUE: Multidetector CT imaging of the abdomen and pelvis was performed using the standard protocol following bolus administration of intravenous contrast.  CONTRAST:  74mL OMNIPAQUE IOHEXOL 300 MG/ML SOLN, 154mL OMNIPAQUE IOHEXOL 300 MG/ML SOLN  COMPARISON:  None.  FINDINGS: Bibasilar linear opacities within the visualized lung bases are most compatible with atelectasis. Visualized lungs are otherwise clear. No pleural or pericardial fusion.  Subcentimeter hypodensity within the right hepatic lobe noted, too small the characterize, but may reflect a small cyst. Liver is otherwise unremarkable. Few hyperdense stones present within the gallbladder lumen. No evidence for acute cholecystitis. No biliary dilatation. Spleen, right adrenal gland, and pancreas demonstrate a normal contrast enhanced appearance.  There is globular soft tissue density involving the left adrenal gland measuring approximately 2.6 x 3.2 x 3.0 cm. There is adjacent inflammatory stranding.   Kidneys are equal in size with symmetric enhancement. Subcentimeter hypodensity noted within the interpolar right kidney, too small the characterize, but statistically likely a small cyst. No nephrolithiasis, hydronephrosis, or focal enhancing renal mass.  Stomach within normal limits. No evidence for bowel obstruction. Appendix is normal. Scattered fluid density present throughout most of the colon, suggesting possible diarrheal illness. No abnormal wall thickening, mucosal enhancement, or inflammatory fat stranding seen about the bowels.  Bladder within normal limits.  Prostate unremarkable.  No free air or fluid. No pathologically enlarged intra-abdominal pelvic lymph nodes identified. Normal intravascular enhancement seen throughout the intra-abdominal aorta and its branch vessels. Mild stranding present about the bilateral iliac vessels, of uncertain significance.  No acute osseous abnormality. No worrisome lytic or blastic osseous lesions.  IMPRESSION: 1. Enlargement of the left adrenal gland, measuring 2.6 x 3.2 x 3.0 cm with adjacent inflammatory stranding. While the unilateral nature of this finding is somewhat unusual, this is favored to reflect an acute adrenal hemorrhage. This may be related to recent surgery/stress or possibly coagulopathy. An underlying mass lesion that has bled is not excluded. A short interval follow-up study in 1 month is recommended to ensure resolution of these findings. This could be performed with either an adrenal mass protocol CT or MRI. 2. Inflammatory soft tissue stranding about the bilateral internal iliac arteries in the lower pelvis. Finding is of uncertain etiology, and may reflect small amount of blood products tracking inferiorly from the hemorrhage in the left adrenal gland. Alternatively, possible vasculitis could also be considered. Attention at follow-up recommended. 3. Intraluminal fluid density within the colonic lumen, suggesting possible diarrheal illness.    Electronically Signed   By: Jeannine Boga M.D.   On: 08/11/2014 06:03   Ct Abd Wo & W Cm  08/12/2014   CLINICAL DATA:  Left adrenal mass, evaluate for continued hemorrhage  EXAM: CT ABDOMEN WITHOUT AND WITH CONTRAST  TECHNIQUE: Multidetector CT imaging of the abdomen was performed following the standard protocol before and following the bolus administration of intravenous contrast.  CONTRAST:  118mL OMNIPAQUE IOHEXOL 300 MG/ML  SOLN  COMPARISON:  08/11/2014  FINDINGS: Lower chest: Bilateral lower lobe atelectasis with trace bilateral pleural effusions.  Hepatobiliary: 6 mm cyst in the right hepatic lobe.  Layering gallstones with vicarious excretion of contrast. No inflammatory changes. No intrahepatic or extrahepatic ductal dilatation.  Pancreas: Within normal limits.  Spleen: Within normal limits.  Adrenals/Urinary Tract: 3.4 x 2.3 cm right adrenal hemorrhage, new. 3.4 x 2.5 cm left adrenal hemorrhage, increased, previously 3.2 x 2.6 cm. Surrounding retroperitoneal stranding/ inflammatory changes.  No evidence of underlying adrenal mass.  9 mm lateral interpolar right renal cyst. No enhancing renal lesions.  No renal calculi or hydronephrosis.  Stomach/Bowel: Stomach is notable for an indwelling enteric tube terminating in the posterior gastric cardia.  Visualized bowel is unremarkable.  Vascular/Lymphatic: No evidence abdominal aortic aneurysm.  No suspicious abdominal lymphadenopathy.  Other: No abdominal ascites.  Musculoskeletal: No focal osseous lesions.  IMPRESSION: 3.4 x 2.3 cm right adrenal hemorrhage, new.  3.4 x 2.5 cm left adrenal hemorrhage, increased.  No evidence of underlying adrenal mass.   Electronically Signed   By: Julian Hy M.D.   On: 08/12/2014 11:07    Scheduled Meds: . acetaminophen  1,000 mg Intravenous Q8H  . bisacodyl  10 mg Rectal BID  . fluticasone  1 spray Each Nare Daily  . pantoprazole (PROTONIX) IV  40 mg Intravenous QHS   Continuous Infusions: . sodium  chloride 50 mL/hr (08/11/14 1028)   Antibiotics Given (last 72 hours)    None      Active Problems:   Adrenal hemorrhage    Time spent: 25 min    Allean Montfort  [facility] Pager [phone]. If 7PM-7AM, please contact night-coverage at [email or web address], password [facility] 08/12/2014, 12:11 PM  LOS: 1 day

## 2014-08-12 NOTE — Care Management Note (Signed)
    Page 1 of 1   08/20/2014     1:06:48 PM CARE MANAGEMENT NOTE 08/20/2014  Patient:  Kenneth Cline, Kenneth Cline   Account Number:  [id number]  Date Initiated:  08/12/2014  Documentation initiated by:  Carles Collet  Subjective/Objective Assessment:   no stool x 10 days, narcotic use s/p rotator cuff repair last week,  acute adrenal hemorrage, lives at home with spouse     Action/Plan:   will follow for dc needs   Anticipated DC Date:  08/14/2014   Anticipated DC Plan:  [place]  CM consult      [facility] Choice  NA   Choice offered to / List presented to:             Status of service:  Completed, signed off Medicare Important Message given?   (If response is "NO", the following Medicare IM given date fields will be blank) Date Medicare IM given:   Medicare IM given by:   Date Additional Medicare IM given:   Additional Medicare IM given by:    Discharge Disposition:  HOME/SELF CARE  Per UR Regulation:  Reviewed for med. necessity/level of care/duration of stay  If discussed at [place] of Stay Meetings, dates discussed:    Comments:  08-20-14 Release of records signed for CM [name].All HH needs to be set up [employer] Chart requested. No further needs at this time. Carles Collet RN BSN CM  08-18-14 Worker's Comp CM Pam- [phone] Pt may need HHPT. Carles Collet RN BSN CM   08-12-14 will follow for dc needs [employer] CM

## 2014-08-12 NOTE — Progress Notes (Signed)
Subjective:  1 -Bilateral Adrenal Hemorrhage - left 3cm adrenal mass with associated hemorrage by CT 08/11/14 on eval left upper quadrant / flank pain. Denies h/o refractory / episodic HTN. Up to date on colonoscopy / cancer screening. CXR w/o chest masses. He does have h/o recent should surgery. F/u imaging with dedicated adrenal protocol CT 4/20 with bilateral hemorrhage and no obvious underlying mass most consistent with stress-response. Cortisol normal. Metanephrines / PRA / Stim test pending.   2 - Post-op Ileus - pt w/o BM in 9 days after Rt shoulder surgery. He has Been on PO narcotics and c/o abd fulness / need to have BM. Now on regimen of IV tylenol / minimizing narcotics and daily bowel stimulation / enemas.   PMH sig or HTN ([facility] only), Rt shoulder surgery. His PCP is Delia Chimes MD in [place], [place]  Today "Lior" is seen in f/u above. Had some scant interval bowel function, miimal NGT output, no emesis. No worsening back / flank pain.  Objective: Vital signs in last 24 hours: Temp:  [97.4 F (36.3 C)-98.5 F (36.9 C)] 98.5 F (36.9 C) (04/20 1316) Pulse Rate:  [94-126] 126 (04/20 1316) Resp:  [16] 16 (04/20 1316) BP: (132-160)/(81-94) 132/81 mmHg (04/20 1316) SpO2:  [92 %-97 %] 94 % (04/20 1316) Last BM Date: 08/02/14  Intake/Output from previous day: 04/19 0701 - 04/20 0700 In: 449.2 [I.V.:399.2; NG/GT:50] Out: 900 [Urine:600; Emesis/NG output:300] Intake/Output this shift: Total I/O In: 0  Out: 500 [Urine:500]  General appearance: alert, cooperative, appears stated age and wife at bedside Eyes: negative Nose: Nares normal. Septum midline. Mucosa normal. No drainage or sinus tenderness. Throat: lips, mucosa, and tongue normal; teeth and gums normal Neck: supple, symmetrical, trachea midline Back: symmetric, no curvature. ROM normal. No CVA tenderness. Resp: non-labored on room air.  Cardio: Nl rate GI: soft, non-tender; bowel sounds normal; no masses,   no organomegaly and NGT clamped, small bilious output in container.  Extremities: extremities normal, atraumatic, no cyanosis or edema Pulses: 2+ and symmetric Skin: Skin color, texture, turgor normal. No rashes or lesions Lymph nodes: Cervical, supraclavicular, and axillary nodes normal. Neurologic: Grossly normal Incision/Wound: Rt shoulder in splint, incision c/d/i.   Lab Results:   Recent Labs  08/10/14 2013  WBC 5.7  HGB 14.3  HCT 39.9  PLT 109*   BMET  Recent Labs  08/10/14 2013  NA 126*  K 3.5  CL 87*  CO2 26  GLUCOSE 226*  BUN 12  CREATININE 0.96  CALCIUM 9.2   PT/INR  Recent Labs  08/11/14 0621  LABPROT 14.7  INR 1.14   ABG No results for input(s): PHART, HCO3 in the last 72 hours.  Invalid input(s): PCO2, PO2  Studies/Results: Dg Chest 2 View  08/10/2014   CLINICAL DATA:  Left chest pain. Shortness of breath. Symptoms began today. Recent rotator cuff surgery.  EXAM: CHEST  2 VIEW  COMPARISON:  05/08/2013  FINDINGS: Heart size is normal. No mediastinal abnormality. There is volume loss in both lower lobes consistent with atelectasis and possibly pneumonia. No dense consolidation or lobar collapse. No effusions. No acute bony finding.  IMPRESSION: Atelectasis or infiltrate at both lung bases.   Electronically Signed   By: Nelson Chimes M.D.   On: 08/10/2014 21:45   Ct Abdomen Pelvis W Contrast  08/11/2014   CLINICAL DATA:  Initial evaluation for lower back pain radiating to abdomen. Also with nausea, constipation.  EXAM: CT ABDOMEN AND PELVIS WITH CONTRAST  TECHNIQUE: Multidetector  CT imaging of the abdomen and pelvis was performed using the standard protocol following bolus administration of intravenous contrast.  CONTRAST:  32mL OMNIPAQUE IOHEXOL 300 MG/ML SOLN, 15mL OMNIPAQUE IOHEXOL 300 MG/ML SOLN  COMPARISON:  None.  FINDINGS: Bibasilar linear opacities within the visualized lung bases are most compatible with atelectasis. Visualized lungs are otherwise  clear. No pleural or pericardial fusion.  Subcentimeter hypodensity within the right hepatic lobe noted, too small the characterize, but may reflect a small cyst. Liver is otherwise unremarkable. Few hyperdense stones present within the gallbladder lumen. No evidence for acute cholecystitis. No biliary dilatation. Spleen, right adrenal gland, and pancreas demonstrate a normal contrast enhanced appearance.  There is globular soft tissue density involving the left adrenal gland measuring approximately 2.6 x 3.2 x 3.0 cm. There is adjacent inflammatory stranding.  Kidneys are equal in size with symmetric enhancement. Subcentimeter hypodensity noted within the interpolar right kidney, too small the characterize, but statistically likely a small cyst. No nephrolithiasis, hydronephrosis, or focal enhancing renal mass.  Stomach within normal limits. No evidence for bowel obstruction. Appendix is normal. Scattered fluid density present throughout most of the colon, suggesting possible diarrheal illness. No abnormal wall thickening, mucosal enhancement, or inflammatory fat stranding seen about the bowels.  Bladder within normal limits.  Prostate unremarkable.  No free air or fluid. No pathologically enlarged intra-abdominal pelvic lymph nodes identified. Normal intravascular enhancement seen throughout the intra-abdominal aorta and its branch vessels. Mild stranding present about the bilateral iliac vessels, of uncertain significance.  No acute osseous abnormality. No worrisome lytic or blastic osseous lesions.  IMPRESSION: 1. Enlargement of the left adrenal gland, measuring 2.6 x 3.2 x 3.0 cm with adjacent inflammatory stranding. While the unilateral nature of this finding is somewhat unusual, this is favored to reflect an acute adrenal hemorrhage. This may be related to recent surgery/stress or possibly coagulopathy. An underlying mass lesion that has bled is not excluded. A short interval follow-up study in 1 month is  recommended to ensure resolution of these findings. This could be performed with either an adrenal mass protocol CT or MRI. 2. Inflammatory soft tissue stranding about the bilateral internal iliac arteries in the lower pelvis. Finding is of uncertain etiology, and may reflect small amount of blood products tracking inferiorly from the hemorrhage in the left adrenal gland. Alternatively, possible vasculitis could also be considered. Attention at follow-up recommended. 3. Intraluminal fluid density within the colonic lumen, suggesting possible diarrheal illness.   Electronically Signed   By: Jeannine Boga M.D.   On: 08/11/2014 06:03   Ct Abd Wo & W Cm  08/12/2014   CLINICAL DATA:  Left adrenal mass, evaluate for continued hemorrhage  EXAM: CT ABDOMEN WITHOUT AND WITH CONTRAST  TECHNIQUE: Multidetector CT imaging of the abdomen was performed following the standard protocol before and following the bolus administration of intravenous contrast.  CONTRAST:  139mL OMNIPAQUE IOHEXOL 300 MG/ML  SOLN  COMPARISON:  08/11/2014  FINDINGS: Lower chest: Bilateral lower lobe atelectasis with trace bilateral pleural effusions.  Hepatobiliary: 6 mm cyst in the right hepatic lobe.  Layering gallstones with vicarious excretion of contrast. No inflammatory changes. No intrahepatic or extrahepatic ductal dilatation.  Pancreas: Within normal limits.  Spleen: Within normal limits.  Adrenals/Urinary Tract: 3.4 x 2.3 cm right adrenal hemorrhage, new. 3.4 x 2.5 cm left adrenal hemorrhage, increased, previously 3.2 x 2.6 cm. Surrounding retroperitoneal stranding/ inflammatory changes.  No evidence of underlying adrenal mass.  9 mm lateral interpolar right renal cyst. No  enhancing renal lesions.  No renal calculi or hydronephrosis.  Stomach/Bowel: Stomach is notable for an indwelling enteric tube terminating in the posterior gastric cardia.  Visualized bowel is unremarkable.  Vascular/Lymphatic: No evidence abdominal aortic aneurysm.   No suspicious abdominal lymphadenopathy.  Other: No abdominal ascites.  Musculoskeletal: No focal osseous lesions.  IMPRESSION: 3.4 x 2.3 cm right adrenal hemorrhage, new.  3.4 x 2.5 cm left adrenal hemorrhage, increased.  No evidence of underlying adrenal mass.   Electronically Signed   By: Julian Hy M.D.   On: 08/12/2014 11:07    Anti-infectives: Anti-infectives    None      Assessment/Plan:  1 -Bilateral Adrenal Hemorrhage - new imaging proves bilateral and w/o underlying mass. This is good news and suggests stress response / medical origin. No large bleed / hematomas / refractory pain.   2 - Post-op Ileus - showing signs of progress. Family understands importance of narcotic minimization and bowel stimulation.  3-  We will arrange Urol f/u in about 6 weeks with repeat adrenal imaging. Will follow prn while in house, call with questions.   [facility], Jolene Guyett 08/12/2014

## 2014-08-12 NOTE — Progress Notes (Signed)
Orthopedics Progress Note  Subjective: Patient was scheduled to see me as an outpatient yesterday.  He reports not doing any therapy in several days for the shoulder.  Currently pain is not too bad. Admitted for constipation due to narcotics.  Objective:  Filed Vitals:   08/12/14 0600  BP: 159/84  Pulse: 94  Temp: 98.2 F (36.8 C)  Resp: 16    General: Awake and alert  Musculoskeletal: right shoulder incisions healing well, no evidence for infection.  Excellent AAROM right shoulder, NVI, minimal swelling Neurovascularly intact  Lab Results  Component Value Date   WBC 5.7 08/10/2014   HGB 14.3 08/10/2014   HCT 39.9 08/10/2014   MCV 84.4 08/10/2014   PLT 109* 08/10/2014       Component Value Date/Time   NA 126* 08/10/2014 2013   K 3.5 08/10/2014 2013   CL 87* 08/10/2014 2013   CO2 26 08/10/2014 2013   GLUCOSE 226* 08/10/2014 2013   BUN 12 08/10/2014 2013   CREATININE 0.96 08/10/2014 2013   CALCIUM 9.2 08/10/2014 2013   GFRNONAA 89* 08/10/2014 2013   GFRAA >90 08/10/2014 2013    Lab Results  Component Value Date   INR 1.14 08/11/2014    Assessment/Plan:  s/p right shoulder arthroscopy and subscap repair doing well orthopedically.  Instructed Kenneth Cline and his wife on continued exercises that he can do while in bed. He will need ongoing ice to the shoulder area and the sling while up walking. Follow up in the clinic in one week. thanks  [employer]. Veverly Fells, MD 08/12/2014 8:03 AM

## 2014-08-13 ENCOUNTER — Inpatient Hospital Stay (HOSPITAL_COMMUNITY): Payer: Worker's Compensation

## 2014-08-13 LAB — GLUCOSE, CAPILLARY
GLUCOSE-CAPILLARY: 76 mg/dL (ref 70–99)
GLUCOSE-CAPILLARY: 127 mg/dL — AB (ref 70–99)
Glucose-Capillary: 77 mg/dL (ref 70–99)
Glucose-Capillary: 137 mg/dL — ABNORMAL HIGH (ref 70–99)

## 2014-08-13 LAB — COMPREHENSIVE METABOLIC PANEL
ALK PHOS: 74 U/L (ref 39–117)
ALT: 20 U/L (ref 0–53)
AST: 21 U/L (ref 0–37)
Albumin: 2.3 g/dL — ABNORMAL LOW (ref 3.5–5.2)
Anion gap: 11 (ref 5–15)
BUN: 20 mg/dL (ref 6–23)
CHLORIDE: 98 mmol/L (ref 96–112)
CO2: 24 mmol/L (ref 19–32)
Calcium: 8.8 mg/dL (ref 8.4–10.5)
Creatinine, Ser: 1.14 mg/dL (ref 0.50–1.35)
GFR, EST AFRICAN AMERICAN: 80 mL/min — AB (ref >90)
GFR, EST NON AFRICAN AMERICAN: 69 mL/min — AB (ref >90)
GLUCOSE: 89 mg/dL (ref 70–99)
POTASSIUM: 3.4 mmol/L — AB (ref 3.5–5.1)
Sodium: 133 mmol/L — ABNORMAL LOW (ref 135–145)
Total Bilirubin: 4.2 mg/dL — ABNORMAL HIGH (ref 0.3–1.2)
Total Protein: 5.7 g/dL — ABNORMAL LOW (ref 6.0–8.3)

## 2014-08-13 LAB — CBC WITH DIFFERENTIAL/PLATELET
Basophils Absolute: 0 10*3/uL (ref 0.0–0.1)
Basophils Relative: 0 % (ref 0–1)
Eosinophils Absolute: 0.2 10*3/uL (ref 0.0–0.7)
Eosinophils Relative: 4 % (ref 0–5)
HCT: 36.5 % — ABNORMAL LOW (ref 39.0–52.0)
Hemoglobin: 12.9 g/dL — ABNORMAL LOW (ref 13.0–17.0)
LYMPHS ABS: 1.3 10*3/uL (ref 0.7–4.0)
LYMPHS PCT: 24 % (ref 12–46)
MCH: 29.9 pg (ref 26.0–34.0)
MCHC: 35.3 g/dL (ref 30.0–36.0)
MCV: 84.5 fL (ref 78.0–100.0)
MONOS PCT: 14 % — AB (ref 3–12)
Monocytes Absolute: 0.8 10*3/uL (ref 0.1–1.0)
NEUTROS ABS: 3.1 10*3/uL (ref 1.7–7.7)
NEUTROS PCT: 58 % (ref 43–77)
PLATELETS: 133 10*3/uL — AB (ref 150–400)
RBC: 4.32 MIL/uL (ref 4.22–5.81)
RDW: 12.3 % (ref 11.5–15.5)
WBC: 5.4 10*3/uL (ref 4.0–10.5)

## 2014-08-13 LAB — CBC
HCT: 36.6 % — ABNORMAL LOW (ref 39.0–52.0)
Hemoglobin: 12.7 g/dL — ABNORMAL LOW (ref 13.0–17.0)
MCH: 29.8 pg (ref 26.0–34.0)
MCHC: 34.7 g/dL (ref 30.0–36.0)
MCV: 85.9 fL (ref 78.0–100.0)
PLATELETS: 102 10*3/uL — AB (ref 150–400)
RBC: 4.26 MIL/uL (ref 4.22–5.81)
RDW: 12.4 % (ref 11.5–15.5)
WBC: 4.9 10*3/uL (ref 4.0–10.5)

## 2014-08-13 MED ORDER — POTASSIUM CHLORIDE CRYS ER 20 MEQ PO TBCR
40.0000 meq | EXTENDED_RELEASE_TABLET | Freq: Once | ORAL | Status: AC
  Administered 2014-08-13: 40 meq via ORAL
  Filled 2014-08-13: qty 2

## 2014-08-13 MED ORDER — ACETAMINOPHEN 325 MG PO TABS
650.0000 mg | ORAL_TABLET | Freq: Four times a day (QID) | ORAL | Status: DC | PRN
  Filled 2014-08-13: qty 2

## 2014-08-13 MED ORDER — POLYETHYLENE GLYCOL 3350 17 G PO PACK
17.0000 g | PACK | Freq: Every day | ORAL | Status: DC
  Administered 2014-08-14 – 2014-08-16 (×3): 17 g via ORAL
  Filled 2014-08-13 (×4): qty 1

## 2014-08-13 MED ORDER — MORPHINE SULFATE 2 MG/ML IJ SOLN: 1.0000 mg | INTRAMUSCULAR | Status: DC | PRN

## 2014-08-13 MED ORDER — SODIUM CHLORIDE 0.9 % IV BOLUS (SEPSIS)
500.0000 mL | Freq: Once | INTRAVENOUS | Status: AC
  Administered 2014-08-13: 500 mL via INTRAVENOUS

## 2014-08-13 MED ORDER — PANTOPRAZOLE SODIUM 40 MG IV SOLR
40.0000 mg | Freq: Once | INTRAVENOUS | Status: AC
  Administered 2014-08-13: 40 mg via INTRAVENOUS
  Filled 2014-08-13: qty 40

## 2014-08-13 MED ORDER — SENNOSIDES-DOCUSATE SODIUM 8.6-50 MG PO TABS
1.0000 | ORAL_TABLET | Freq: Two times a day (BID) | ORAL | Status: DC
  Administered 2014-08-13 – 2014-08-15 (×6): 1 via ORAL
  Filled 2014-08-13 (×5): qty 1

## 2014-08-13 MED ORDER — ACETAMINOPHEN 10 MG/ML IV SOLN
1000.0000 mg | Freq: Once | INTRAVENOUS | Status: AC
  Administered 2014-08-13: 1000 mg via INTRAVENOUS
  Filled 2014-08-13: qty 100

## 2014-08-13 MED ORDER — TRAMADOL HCL 50 MG PO TABS
50.0000 mg | ORAL_TABLET | Freq: Four times a day (QID) | ORAL | Status: DC | PRN
  Administered 2014-08-14 – 2014-08-16 (×3): 50 mg via ORAL
  Filled 2014-08-13 (×3): qty 1

## 2014-08-13 MED ORDER — ACETAMINOPHEN 160 MG/5ML PO SOLN
650.0000 mg | Freq: Four times a day (QID) | ORAL | Status: DC | PRN
  Administered 2014-08-13: 650 mg via ORAL
  Filled 2014-08-13: qty 20.3

## 2014-08-13 NOTE — Progress Notes (Signed)
Notified Baltazar Najjar, NP via text page that pt requesting tylenol for moderate pain. Will continue to monitor pt. Ranelle Oyster, RN

## 2014-08-13 NOTE — Progress Notes (Signed)
Spoke to someone in the main lab- stating that the [facility] testing first lab draw is at 730am (before the schedule med). Med is due at this time- RN will move medication to 0800.

## 2014-08-13 NOTE — Progress Notes (Signed)
Patient was tachy throughout the night- he was asymptomatic, stating that he could feel his heart racing once in a while but not really. Patient had a pulse reading of 144 at one point but he was having a BM during that time. This am, pulse has resulted to the low 100's. Patient is resting with no signs of SOB, agitation, or sweats. NP Baltazar Najjar has been notified. Will continue to monitor

## 2014-08-13 NOTE — Evaluation (Signed)
Physical Therapy Evaluation Patient Details Name: Kenneth Cline MRN: [id number] DOB: 1956/02/18 Today's Date: 08/13/2014   History of Present Illness  59 y.o. male admitted with adrenal hemorrhage  Clinical Impression  Pt admitted with above diagnosis. Pt currently with functional limitations due to the deficits listed below (see PT Problem List). Ambulates with mild balance deficits but per wife, apparently greatly improved compared to yesterday. Pt required some assistance with ADLs following his rotator cuff repair recently but was not demonstrating difficulty with gait. Anticipate patients balance and activity tolerance will improve as he progresses medically. Will monitor for progress. Pt will benefit from skilled PT to increase their independence and safety with mobility to allow discharge to the venue listed below.       Follow Up Recommendations No PT follow up    Equipment Recommendations  None recommended by PT    Recommendations for Other Services       Precautions / Restrictions Precautions Precautions: Shoulder (Rt - rotator cuff surgery recently) Type of Shoulder Precautions: rotator cuff Restrictions Weight Bearing Restrictions: No      Mobility  Bed Mobility Overal bed mobility: Modified Independent             General bed mobility comments: requires extra time. Safely protects Rt shoulder  Transfers Overall transfer level: Needs assistance Equipment used: None Transfers: Sit to/from Stand Sit to Stand: Supervision         General transfer comment: supervision for safety. minimal sway noted. Feels slighlty off balance.  Ambulation/Gait Ambulation/Gait assistance: Min guard Ambulation Distance (Feet): 300 Feet Assistive device: None Gait Pattern/deviations: Step-through pattern;Decreased stride length;Decreased dorsiflexion - right;Decreased dorsiflexion - left;Drifts right/left   Gait velocity interpretation: at or above normal speed for  age/gender General Gait Details: Patient initially dragging his feet intermittently. Cues for awareness and improved foot clearance which he responded to well. Drifting Lt and Rt at times. Staggered anteriorly on a couple of occasions but able to self correct. Tolerated high marching, variable speeds, and backwards stepping with minor difficulty and change in balance. Did not require physical assist to correct balance during bout.  Stairs            Wheelchair Mobility    Modified Rankin (Stroke Patients Only)       Balance Overall balance assessment: Needs assistance Sitting-balance support: No upper extremity supported;Feet supported Sitting balance-Leahy Scale: Normal     Standing balance support: No upper extremity supported Standing balance-Leahy Scale: Good                               Pertinent Vitals/Pain Pain Assessment: No/denies pain    Home Living Family/patient expects to be discharged to:: Private residence Living Arrangements: Spouse/significant other Available Help at Discharge: Family;Available 24 hours/day Type of Home: House Home Access: Stairs to enter Entrance Stairs-Rails: None Entrance Stairs-Number of Steps: 3 Home Layout: One level Home Equipment: None      Prior Function Level of Independence: Needs assistance   Gait / Transfers Assistance Needed: independent  ADL's / Homemaking Assistance Needed: Wife has been assisting with bath/dress since rotator cuff surgery a couple of weeks ago  Comments: Independent prior to surgery for rotator cuff.     Hand Dominance   Dominant Hand: Right    Extremity/Trunk Assessment   Upper Extremity Assessment: Defer to OT evaluation           Lower Extremity Assessment: Overall WFL for  tasks assessed         Communication   Communication: No difficulties  Cognition Arousal/Alertness: Awake/alert Behavior During Therapy: WFL for tasks assessed/performed Overall Cognitive  Status: Within Functional Limits for tasks assessed                      General Comments      Exercises General Exercises - Lower Extremity Ankle Circles/Pumps: AROM;Both;10 reps;Supine      Assessment/Plan    PT Assessment Patient needs continued PT services  PT Diagnosis Difficulty walking;Abnormality of gait   PT Problem List Decreased activity tolerance;Decreased balance;Decreased mobility;Decreased knowledge of use of DME;Pain  PT Treatment Interventions DME instruction;Gait training;Stair training;Functional mobility training;Therapeutic activities;Therapeutic exercise;Balance training;Neuromuscular re-education;Patient/family education;Modalities   PT Goals (Current goals can be found in the Care Plan section) Acute Rehab PT Goals Patient Stated Goal: Get better PT Goal Formulation: With patient Time For Goal Achievement: 08/27/14 Potential to Achieve Goals: Good    Frequency Min 3X/week   Barriers to discharge        Co-evaluation               End of Session   Activity Tolerance: Patient tolerated treatment well Patient left: in bed;with call bell/phone within reach;with family/visitor present Nurse Communication: Mobility status         Time: 8453-6468 PT Time Calculation (min) (ACUTE ONLY): 19 min   Charges:   PT Evaluation $Initial PT Evaluation Tier I: 1 Procedure     PT G CodesEllouise Newer 08/13/2014, 6:26 PM Camille Bal [place], [place]

## 2014-08-13 NOTE — Progress Notes (Signed)
Patient did not urinate that much throughout the night. RN did a bladder scan this am = <148 of residual. Patient is resting this morning with no desire to urinate. Will notify MD/NP with any changes.

## 2014-08-13 NOTE — Progress Notes (Signed)
PROGRESS NOTE  Kenneth Cline UYQ:[id number] DOB: 10/14/1955 DOA: 08/10/2014 PCP: Delia Chimes, NP  Assessment/Plan: Adrenal hemmorrhage -repeat CT scan shows b/l adrenal hemm -3.4 x 2.3 cm right adrenal hemorrhage, new. -3.4 x 2.5 cm left adrenal hemorrhage, increased. ACTH stim test, plasma metanephrines pending  Constipation from narcotic use -responded to enema/suppository -good bowel sounds -advance diet -limit narcotics  Fatigue -await adrenal work up -check TSH  N/V-- NG d/c  HTN- PRN Medications until taking PO  Hyponatremia -IVF  Elevated blood sugars -resolved  Thrombocytopenia -?etiology   Code Status: full Family Communication: wife at bedside Disposition Plan:   Consultants:  urology  Procedures:     HPI/Subjective: Several bowel movements  Objective: Filed Vitals:   08/13/14 1349  BP: 132/77  Pulse: 103  Temp: 97.5 F (36.4 C)  Resp: 18    Intake/Output Summary (Last 24 hours) at 08/13/14 1402 Last data filed at 08/12/14 2127  Gross per 24 hour  Intake 1439.5 ml  Output      0 ml  Net 1439.5 ml   Filed Weights   08/10/14 2005  Weight: 91.627 kg (202 lb)    Exam:   General:  A+Ox3, NAD  Cardiovascular: rrr  Respiratory: clear  Abdomen: +BS, soft  Musculoskeletal: no edema   Data Reviewed: Basic Metabolic Panel:  Recent Labs Lab 08/10/14 2013 08/13/14 0908  NA 126* 133*  K 3.5 3.4*  CL 87* 98  CO2 26 24  GLUCOSE 226* 89  BUN 12 20  CREATININE 0.96 1.14  CALCIUM 9.2 8.8   Liver Function Tests:  Recent Labs Lab 08/11/14 0051 08/13/14 0908  AST 26 21  ALT 28 20  ALKPHOS 63 74  BILITOT 1.6* 4.2*  PROT 7.4 5.7*  ALBUMIN 3.4* 2.3*    Recent Labs Lab 08/11/14 0051  LIPASE 25   No results for input(s): AMMONIA in the last 168 hours. CBC:  Recent Labs Lab 08/10/14 2013 08/13/14 0908  WBC 5.7 4.9  HGB 14.3 12.7*  HCT 39.9 36.6*  MCV 84.4 85.9  PLT 109* 102*   Cardiac Enzymes: No  results for input(s): CKTOTAL, CKMB, CKMBINDEX, TROPONINI in the last 168 hours. BNP (last 3 results)  Recent Labs  08/10/14 2013  BNP 22.5    ProBNP (last 3 results) No results for input(s): PROBNP in the last 8760 hours.  CBG:  Recent Labs Lab 08/12/14 1734 08/12/14 2123 08/13/14 0751 08/13/14 1156  GLUCAP 65* 88 77 76    No results found for this or any previous visit (from the past 240 hour(s)).   Studies: Ct Abd Wo & W Cm  08/12/2014   CLINICAL DATA:  Left adrenal mass, evaluate for continued hemorrhage  EXAM: CT ABDOMEN WITHOUT AND WITH CONTRAST  TECHNIQUE: Multidetector CT imaging of the abdomen was performed following the standard protocol before and following the bolus administration of intravenous contrast.  CONTRAST:  141mL OMNIPAQUE IOHEXOL 300 MG/ML  SOLN  COMPARISON:  08/11/2014  FINDINGS: Lower chest: Bilateral lower lobe atelectasis with trace bilateral pleural effusions.  Hepatobiliary: 6 mm cyst in the right hepatic lobe.  Layering gallstones with vicarious excretion of contrast. No inflammatory changes. No intrahepatic or extrahepatic ductal dilatation.  Pancreas: Within normal limits.  Spleen: Within normal limits.  Adrenals/Urinary Tract: 3.4 x 2.3 cm right adrenal hemorrhage, new. 3.4 x 2.5 cm left adrenal hemorrhage, increased, previously 3.2 x 2.6 cm. Surrounding retroperitoneal stranding/ inflammatory changes.  No evidence of underlying adrenal mass.  9 mm lateral interpolar right  renal cyst. No enhancing renal lesions.  No renal calculi or hydronephrosis.  Stomach/Bowel: Stomach is notable for an indwelling enteric tube terminating in the posterior gastric cardia.  Visualized bowel is unremarkable.  Vascular/Lymphatic: No evidence abdominal aortic aneurysm.  No suspicious abdominal lymphadenopathy.  Other: No abdominal ascites.  Musculoskeletal: No focal osseous lesions.  IMPRESSION: 3.4 x 2.3 cm right adrenal hemorrhage, new.  3.4 x 2.5 cm left adrenal  hemorrhage, increased.  No evidence of underlying adrenal mass.   Electronically Signed   By: Julian Hy M.D.   On: 08/12/2014 11:07    Scheduled Meds: . bisacodyl  10 mg Rectal BID  . fluticasone  1 spray Each Nare Daily  . insulin aspart  0-5 Units Subcutaneous QHS  . insulin aspart  0-9 Units Subcutaneous TID WC  . pantoprazole (PROTONIX) IV  40 mg Intravenous QHS  . polyethylene glycol  17 g Oral Daily  . potassium chloride  40 mEq Oral Once  . senna-docusate  1 tablet Oral BID   Continuous Infusions: . sodium chloride 100 mL/hr at 08/12/14 2007   Antibiotics Given (last 72 hours)    None      Active Problems:   Adrenal hemorrhage    Time spent: 25 min    Eulogio Bear  [facility] Pager [phone]. If 7PM-7AM, please contact night-coverage at [email or web address], password [email or web address] 08/13/2014, 2:02 PM  LOS: 2 days

## 2014-08-14 DIAGNOSIS — E2740 Unspecified adrenocortical insufficiency: Secondary | ICD-10-CM | POA: Diagnosis present

## 2014-08-14 DIAGNOSIS — E876 Hypokalemia: Secondary | ICD-10-CM | POA: Diagnosis present

## 2014-08-14 DIAGNOSIS — R509 Fever, unspecified: Secondary | ICD-10-CM

## 2014-08-14 DIAGNOSIS — E871 Hypo-osmolality and hyponatremia: Secondary | ICD-10-CM | POA: Diagnosis present

## 2014-08-14 LAB — COMPREHENSIVE METABOLIC PANEL
ALK PHOS: 94 U/L (ref 39–117)
ALT: 25 U/L (ref 0–53)
ANION GAP: 11 (ref 5–15)
AST: 32 U/L (ref 0–37)
Albumin: 2.5 g/dL — ABNORMAL LOW (ref 3.5–5.2)
BUN: 13 mg/dL (ref 6–23)
CHLORIDE: 97 mmol/L (ref 96–112)
CO2: 23 mmol/L (ref 19–32)
Calcium: 8.5 mg/dL (ref 8.4–10.5)
Creatinine, Ser: 0.9 mg/dL (ref 0.50–1.35)
GFR calc non Af Amer: >90 mL/min (ref >90)
Glucose, Bld: 157 mg/dL — ABNORMAL HIGH (ref 70–99)
POTASSIUM: 3 mmol/L — AB (ref 3.5–5.1)
Sodium: 131 mmol/L — ABNORMAL LOW (ref 135–145)
Total Bilirubin: 2.1 mg/dL — ABNORMAL HIGH (ref 0.3–1.2)
Total Protein: 6 g/dL (ref 6.0–8.3)

## 2014-08-14 LAB — URINALYSIS, ROUTINE W REFLEX MICROSCOPIC
GLUCOSE, UA: NEGATIVE mg/dL
Ketones, ur: NEGATIVE mg/dL
Leukocytes, UA: NEGATIVE
Nitrite: NEGATIVE
PROTEIN: 30 mg/dL — AB
SPECIFIC GRAVITY, URINE: 1.017 (ref 1.005–1.030)
Urobilinogen, UA: 1 mg/dL (ref 0.0–1.0)
pH: 5.5 (ref 5.0–8.0)

## 2014-08-14 LAB — TSH: TSH: 7.174 u[IU]/mL — ABNORMAL HIGH (ref 0.350–4.500)

## 2014-08-14 LAB — INFLUENZA PANEL BY PCR (TYPE A & B)
H1N1FLUPCR: NOT DETECTED
Influenza A By PCR: NEGATIVE
Influenza B By PCR: NEGATIVE

## 2014-08-14 LAB — URINE MICROSCOPIC-ADD ON

## 2014-08-14 LAB — GLUCOSE, CAPILLARY
Glucose-Capillary: 115 mg/dL — ABNORMAL HIGH (ref 70–99)
Glucose-Capillary: 132 mg/dL — ABNORMAL HIGH (ref 70–99)

## 2014-08-14 LAB — ACTH STIMULATION, 3 TIME POINTS
CORTISOL BASE: 1 ug/dL
Cortisol, 30 Min: 1.1 ug/dL — ABNORMAL LOW (ref >20.0)
Cortisol, 60 Min: 1 ug/dL — ABNORMAL LOW (ref >20)

## 2014-08-14 LAB — LACTIC ACID, PLASMA: LACTIC ACID, VENOUS: 1.8 mmol/L (ref 0.5–2.0)

## 2014-08-14 MED ORDER — HYDROCORTISONE NA SUCCINATE PF 100 MG IJ SOLR
50.0000 mg | Freq: Three times a day (TID) | INTRAMUSCULAR | Status: DC
  Administered 2014-08-14 – 2014-08-15 (×4): 50 mg via INTRAVENOUS
  Filled 2014-08-14 (×7): qty 1

## 2014-08-14 MED ORDER — PANTOPRAZOLE SODIUM 40 MG PO TBEC
40.0000 mg | DELAYED_RELEASE_TABLET | Freq: Every day | ORAL | Status: DC
  Administered 2014-08-14 – 2014-08-17 (×4): 40 mg via ORAL
  Filled 2014-08-14 (×3): qty 1

## 2014-08-14 NOTE — Progress Notes (Signed)
PROGRESS NOTE  Kenneth Cline HEN:[id number] DOB: 03-24-1956 DOA: 08/10/2014 PCP: Kenneth Chimes, NP  Assessment/Plan: Adrenal hemmorrhage -repeat CT scan shows b/l adrenal hemm -3.4 x 2.3 cm right adrenal hemorrhage, new. -3.4 x 2.5 cm left adrenal hemorrhage, increased. ACTH stim test positive- start steroids, plasma metanephrines pending, renin/aldos pending  Fever -? PNA -added incentive spirometry on 4/20- never given to patient -add levaquin for now  Constipation from narcotic use -responded to enema/suppository -good bowel sounds -advance diet -limit narcotics  Fatigue +adrenal insuff from hemmorrhage -check TSH  N/V-- NG d/c -resolved Patient eating  HTN- PRN Medications until taking PO  Hyponatremia -IVF  Elevated blood sugars -resolved  Thrombocytopenia -?etiology   Code Status: full Family Communication: wife at bedside Disposition Plan:   Consultants:  urology  Procedures:     HPI/Subjective: Eating well but not sleeping Wife at bedside  Objective: Filed Vitals:   08/14/14 0531  BP: 125/77  Pulse: 95  Temp: 98.9 F (37.2 C)  Resp: 16    Intake/Output Summary (Last 24 hours) at 08/14/14 1008 Last data filed at 08/14/14 0947  Gross per 24 hour  Intake 1891.67 ml  Output      0 ml  Net 1891.67 ml   Filed Weights   08/10/14 2005  Weight: 91.627 kg (202 lb)    Exam:   General:  A+Ox3, NAD  Cardiovascular: rrr  Respiratory: clear  Abdomen: +BS, soft  Musculoskeletal: no edema   Data Reviewed: Basic Metabolic Panel:  Recent Labs Lab 08/10/14 2013 08/13/14 0908 08/13/14 2320  NA 126* 133* 131*  K 3.5 3.4* 3.0*  CL 87* 98 97  CO2 26 24 23   GLUCOSE 226* 89 157*  BUN 12 20 13   CREATININE 0.96 1.14 0.90  CALCIUM 9.2 8.8 8.5   Liver Function Tests:  Recent Labs Lab 08/11/14 0051 08/13/14 0908 08/13/14 2320  AST 26 21 32  ALT 28 20 25   ALKPHOS 63 74 94  BILITOT 1.6* 4.2* 2.1*  PROT 7.4 5.7* 6.0    ALBUMIN 3.4* 2.3* 2.5*    Recent Labs Lab 08/11/14 0051  LIPASE 25   No results for input(s): AMMONIA in the last 168 hours. CBC:  Recent Labs Lab 08/10/14 2013 08/13/14 0908 08/13/14 2320  WBC 5.7 4.9 5.4  NEUTROABS  --   --  3.1  HGB 14.3 12.7* 12.9*  HCT 39.9 36.6* 36.5*  MCV 84.4 85.9 84.5  PLT 109* 102* 133*   Cardiac Enzymes: No results for input(s): CKTOTAL, CKMB, CKMBINDEX, TROPONINI in the last 168 hours. BNP (last 3 results)  Recent Labs  08/10/14 2013  BNP 22.5    ProBNP (last 3 results) No results for input(s): PROBNP in the last 8760 hours.  CBG:  Recent Labs Lab 08/13/14 0751 08/13/14 1156 08/13/14 1648 08/13/14 2105 08/14/14 0749  GLUCAP 77 76 137* 127* 115*    No results found for this or any previous visit (from the past 240 hour(s)).   Studies: Ct Abd Wo & W Cm  08/12/2014   CLINICAL DATA:  Left adrenal mass, evaluate for continued hemorrhage  EXAM: CT ABDOMEN WITHOUT AND WITH CONTRAST  TECHNIQUE: Multidetector CT imaging of the abdomen was performed following the standard protocol before and following the bolus administration of intravenous contrast.  CONTRAST:  178mL OMNIPAQUE IOHEXOL 300 MG/ML  SOLN  COMPARISON:  08/11/2014  FINDINGS: Lower chest: Bilateral lower lobe atelectasis with trace bilateral pleural effusions.  Hepatobiliary: 6 mm cyst in the right hepatic lobe.  Layering gallstones with vicarious excretion of contrast. No inflammatory changes. No intrahepatic or extrahepatic ductal dilatation.  Pancreas: Within normal limits.  Spleen: Within normal limits.  Adrenals/Urinary Tract: 3.4 x 2.3 cm right adrenal hemorrhage, new. 3.4 x 2.5 cm left adrenal hemorrhage, increased, previously 3.2 x 2.6 cm. Surrounding retroperitoneal stranding/ inflammatory changes.  No evidence of underlying adrenal mass.  9 mm lateral interpolar right renal cyst. No enhancing renal lesions.  No renal calculi or hydronephrosis.  Stomach/Bowel: Stomach is  notable for an indwelling enteric tube terminating in the posterior gastric cardia.  Visualized bowel is unremarkable.  Vascular/Lymphatic: No evidence abdominal aortic aneurysm.  No suspicious abdominal lymphadenopathy.  Other: No abdominal ascites.  Musculoskeletal: No focal osseous lesions.  IMPRESSION: 3.4 x 2.3 cm right adrenal hemorrhage, new.  3.4 x 2.5 cm left adrenal hemorrhage, increased.  No evidence of underlying adrenal mass.   Electronically Signed   By: Julian Hy M.D.   On: 08/12/2014 11:07   Dg Chest Port 1 View  08/13/2014   CLINICAL DATA:  Fever, history of hypertension.  EXAM: PORTABLE CHEST - 1 VIEW  COMPARISON:  08/10/2014  FINDINGS: Low lung volumes with bibasilar atelectasis, similar to prior study. Heart is normal size. No effusions. No acute bony abnormality. Probable resection of the distal right clavicle.  IMPRESSION: Low lung volumes, bibasilar atelectasis.   Electronically Signed   By: Rolm Baptise M.D.   On: 08/13/2014 22:56    Scheduled Meds: . bisacodyl  10 mg Rectal BID  . fluticasone  1 spray Each Nare Daily  . hydrocortisone sod succinate (SOLU-CORTEF) inj  50 mg Intravenous Q8H  . pantoprazole (PROTONIX) IV  40 mg Intravenous QHS  . polyethylene glycol  17 g Oral Daily  . senna-docusate  1 tablet Oral BID   Continuous Infusions: . sodium chloride 100 mL/hr at 08/13/14 1550   Antibiotics Given (last 72 hours)    None      Active Problems:   Adrenal hemorrhage   Adrenal insufficiency    Time spent: 25 min    Kenneth Cline Kenneth Cline  [facility] Pager [phone]. If 7PM-7AM, please contact night-coverage at [email or web address], password [facility] 08/14/2014, 10:08 AM  LOS: 3 days

## 2014-08-14 NOTE — Progress Notes (Signed)
Educated pt. On use of Incentive spriometer (IS). Pt. Was able to verbalize reasoning for IS and gave return demonstration on use. Pt. Was able to reach goal of 1500. Encouraged pt. To use at least 5-6 times/hour while awake. Will continue to monitor.

## 2014-08-14 NOTE — Progress Notes (Signed)
Pt spiked fever earlier of 102.16F. Previously afebrile from what I can see on flow sheet. Ordered CBC with diff, CMP, LA, CXR, blood cultures, flu test, UA, Urine cx, bolus and Tylenol. CBC without leukocytosis CMP OK CXR neg except atelectasis LA 1.8 UA neg Will order droplet precs til flu results. KJKG, NP

## 2014-08-15 DIAGNOSIS — K5900 Constipation, unspecified: Secondary | ICD-10-CM | POA: Diagnosis present

## 2014-08-15 DIAGNOSIS — R509 Fever, unspecified: Secondary | ICD-10-CM | POA: Diagnosis not present

## 2014-08-15 LAB — BASIC METABOLIC PANEL
Anion gap: 13 (ref 5–15)
BUN: 9 mg/dL (ref 6–23)
CALCIUM: 8.4 mg/dL (ref 8.4–10.5)
CO2: 20 mmol/L (ref 19–32)
Chloride: 98 mmol/L (ref 96–112)
Creatinine, Ser: 0.71 mg/dL (ref 0.50–1.35)
GFR calc non Af Amer: >90 mL/min (ref >90)
GLUCOSE: 149 mg/dL — AB (ref 70–99)
POTASSIUM: 3.5 mmol/L (ref 3.5–5.1)
Sodium: 131 mmol/L — ABNORMAL LOW (ref 135–145)

## 2014-08-15 LAB — URINE CULTURE
Colony Count: NO GROWTH
Culture: NO GROWTH
Special Requests: NORMAL

## 2014-08-15 LAB — METANEPHRINES, PLASMA
NORMETANEPHRINE FREE: 160 pg/mL — AB (ref <=148)
Total Metanephrines-Plasma: 160 pg/mL (ref <=205)

## 2014-08-15 LAB — CBC
HCT: 33.3 % — ABNORMAL LOW (ref 39.0–52.0)
HEMOGLOBIN: 12.1 g/dL — AB (ref 13.0–17.0)
MCH: 29.8 pg (ref 26.0–34.0)
MCHC: 36.3 g/dL — AB (ref 30.0–36.0)
MCV: 82 fL (ref 78.0–100.0)
Platelets: 129 10*3/uL — ABNORMAL LOW (ref 150–400)
RBC: 4.06 MIL/uL — ABNORMAL LOW (ref 4.22–5.81)
RDW: 11.8 % (ref 11.5–15.5)
WBC: 8.6 10*3/uL (ref 4.0–10.5)

## 2014-08-15 MED ORDER — POTASSIUM CHLORIDE CRYS ER 20 MEQ PO TBCR
40.0000 meq | EXTENDED_RELEASE_TABLET | Freq: Two times a day (BID) | ORAL | Status: DC
  Administered 2014-08-15 (×2): 40 meq via ORAL
  Filled 2014-08-15 (×4): qty 2

## 2014-08-15 MED ORDER — HYDROCORTISONE 20 MG PO TABS
20.0000 mg | ORAL_TABLET | Freq: Two times a day (BID) | ORAL | Status: DC
Start: 2014-08-15 — End: 2014-08-18
  Administered 2014-08-15 – 2014-08-18 (×7): 20 mg via ORAL
  Filled 2014-08-15 (×9): qty 1

## 2014-08-15 MED ORDER — TRAMADOL HCL 50 MG PO TABS
50.0000 mg | ORAL_TABLET | Freq: Once | ORAL | Status: AC
Start: 2014-08-15 — End: 2014-08-15
  Administered 2014-08-15: 50 mg via ORAL
  Filled 2014-08-15: qty 1

## 2014-08-15 MED ORDER — SORBITOL 70 % SOLN
960.0000 mL | TOPICAL_OIL | Freq: Once | ORAL | Status: AC
  Administered 2014-08-15: 960 mL via RECTAL
  Filled 2014-08-15: qty 240

## 2014-08-15 MED ORDER — HYDROCORTISONE ACETATE 25 MG RE SUPP
25.0000 mg | Freq: Two times a day (BID) | RECTAL | Status: DC | PRN
  Administered 2014-08-16 – 2014-08-19 (×8): 25 mg via RECTAL
  Filled 2014-08-15 (×13): qty 1

## 2014-08-15 NOTE — Progress Notes (Addendum)
Patient has rash that has developed on back of his neck and around to left side of neck. Appears welped and pink, and raised - resembles "bulls-eye:. Pt without complaints of itching at sites or difficulty breathing, only complains of site being "sore." Pt's wife stated that these areas are new; pt's wife took pictures on her cellphone. Rogue Bussing, NP paged.

## 2014-08-15 NOTE — Progress Notes (Signed)
PROGRESS NOTE  Kenneth Cline DSK:[id number] DOB: 09-Oct-1955 DOA: 08/10/2014 PCP: Delia Chimes, NP  Assessment/Plan: Adrenal hemmorrhage:  related to surgery? -repeat CT scan shows b/l adrenal hemm -3.4 x 2.3 cm right adrenal hemorrhage, new. -3.4 x 2.5 cm left adrenal hemorrhage, increased. ACTH stim test positive- change to po cortef, plasma metanephrines pending, renin/aldos pending. Will need outpatient follow-up with endocrinology.  Fever Resolved. UA, blood cultures negative. Chest x-ray without definite pneumonia. Minimal cough. None further. Dr. Gaynelle Adu note from 4/22 states start Levaquin, but this was not started. Patient prefers to avoid antibiotics given his history of Stevens-Johnson syndrome, though he has tolerated Levaquin in the past. Will hold off on antibiotics for now.  May be related to hemorrhage, constipation, atelectasis.  Constipation from narcotic use Still feels constipated. Had a very small bowel movement yesterday. Having a lot of pain which he attributes to severe constipation, rather than the adrenal hemorrhage. Had results with an enema the other day. We will order a smog enema. Continue Mira lax, Senokot, Dulcolax suppositories. Increase activity. Push potassium toward 5.0, to help with bowel motility.  Fatigue +adrenal insuff from hemmorrhage Also, TSH high. Will check free T4 and free T3.  N/V-- NG d/c Improved, but had an episode of vomiting today. Able to take pills.  HTN- PRN Medications until taking PO  Hyponatremia -IVF  Hypokalemia: Improving  Elevated blood sugars -resolved  Thrombocytopenia -?etiology  Recent shoulder surgery:  Consulted OT  Code Status: full Family Communication: wife at bedside Disposition Plan:   Consultants:  urology  Procedures:     HPI/Subjective: Had abdominal pain, upper and lower. Thinks it's from constipation. Vomited this morning. Minimal cough. No dysuria or rash.  Objective: Filed  Vitals:   08/15/14 0456  BP: 155/84  Pulse:   Temp: 98.4 F (36.9 C)  Resp: 18    Intake/Output Summary (Last 24 hours) at 08/15/14 1211 Last data filed at 08/15/14 0056  Gross per 24 hour  Intake    240 ml  Output   1400 ml  Net  -1160 ml   Filed Weights   08/10/14 2005  Weight: 91.627 kg (202 lb)    Exam:   General:  A+Ox3, weak appearing  Cardiovascular: rrr without murmurs gallops rubs  Respiratory: clear to auscultation without wheezes rhonchi or rales  Abdomen: Diminished BS, soft, nontender nondistended  Musculoskeletal: no edema. Steri-Strips on right shoulder noted.  Data Reviewed: Basic Metabolic Panel:  Recent Labs Lab 08/10/14 2013 08/13/14 0908 08/13/14 2320 08/15/14 0540  NA 126* 133* 131* 131*  K 3.5 3.4* 3.0* 3.5  CL 87* 98 97 98  CO2 26 24 23 20   GLUCOSE 226* 89 157* 149*  BUN 12 20 13 9   CREATININE 0.96 1.14 0.90 0.71  CALCIUM 9.2 8.8 8.5 8.4   Liver Function Tests:  Recent Labs Lab 08/11/14 0051 08/13/14 0908 08/13/14 2320  AST 26 21 32  ALT 28 20 25   ALKPHOS 63 74 94  BILITOT 1.6* 4.2* 2.1*  PROT 7.4 5.7* 6.0  ALBUMIN 3.4* 2.3* 2.5*    Recent Labs Lab 08/11/14 0051  LIPASE 25   No results for input(s): AMMONIA in the last 168 hours. CBC:  Recent Labs Lab 08/10/14 2013 08/13/14 0908 08/13/14 2320 08/15/14 0540  WBC 5.7 4.9 5.4 8.6  NEUTROABS  --   --  3.1  --   HGB 14.3 12.7* 12.9* 12.1*  HCT 39.9 36.6* 36.5* 33.3*  MCV 84.4 85.9 84.5 82.0  PLT 109* 102*  133* 129*   Cardiac Enzymes: No results for input(s): CKTOTAL, CKMB, CKMBINDEX, TROPONINI in the last 168 hours. BNP (last 3 results)  Recent Labs  08/10/14 2013  BNP 22.5    ProBNP (last 3 results) No results for input(s): PROBNP in the last 8760 hours.  CBG:  Recent Labs Lab 08/13/14 1156 08/13/14 1648 08/13/14 2105 08/14/14 0749 08/14/14 1211  GLUCAP 76 137* 127* 115* 132*    Recent Results (from the past 240 hour(s))  Culture,  blood (routine x 2)     Status: None (Preliminary result)   Collection Time: 08/13/14 11:20 PM  Result Value Ref Range Status   Specimen Description BLOOD LEFT ARM  Final   Special Requests   Final    BOTTLES DRAWN AEROBIC ONLY 10CCS BLUE NO ANTIBIOTICS   Culture   Final           BLOOD CULTURE RECEIVED NO GROWTH TO DATE CULTURE WILL BE HELD FOR 5 DAYS BEFORE ISSUING A FINAL NEGATIVE REPORT Performed at [facility]    Report Status PENDING  Incomplete  Culture, blood (routine x 2)     Status: None (Preliminary result)   Collection Time: 08/13/14 11:30 PM  Result Value Ref Range Status   Specimen Description BLOOD LEFT HAND  Final   Special Requests BOTTLES DRAWN AEROBIC ONLY 5CCS BLUE  Final   Culture   Final           BLOOD CULTURE RECEIVED NO GROWTH TO DATE CULTURE WILL BE HELD FOR 5 DAYS BEFORE ISSUING A FINAL NEGATIVE REPORT Performed at [facility]    Report Status PENDING  Incomplete     Studies: Dg Chest Port 1 View  08/13/2014   CLINICAL DATA:  Fever, history of hypertension.  EXAM: PORTABLE CHEST - 1 VIEW  COMPARISON:  08/10/2014  FINDINGS: Low lung volumes with bibasilar atelectasis, similar to prior study. Heart is normal size. No effusions. No acute bony abnormality. Probable resection of the distal right clavicle.  IMPRESSION: Low lung volumes, bibasilar atelectasis.   Electronically Signed   By: Rolm Baptise M.D.   On: 08/13/2014 22:56    Scheduled Meds: . bisacodyl  10 mg Rectal BID  . fluticasone  1 spray Each Nare Daily  . hydrocortisone  20 mg Oral BID  . pantoprazole  40 mg Oral Daily  . polyethylene glycol  17 g Oral Daily  . potassium chloride  40 mEq Oral BID  . senna-docusate  1 tablet Oral BID  . sorbitol, milk of mag, mineral oil, glycerin (SMOG) enema  960 mL Rectal Once   Continuous Infusions: . sodium chloride 100 mL/hr at 08/15/14 0344   Antibiotics Given (last 72 hours)    None      Active Problems:   Adrenal hemorrhage    Adrenal insufficiency   Hyponatremia   Hypokalemia    Time spent: 25 min    [facility] Pager [phone] [email or web address], password [email or web address] 08/15/2014, 12:11 PM  LOS: 4 days

## 2014-08-15 NOTE — Progress Notes (Signed)
Utilization review complete 

## 2014-08-15 NOTE — Progress Notes (Addendum)
Called to patient bedside; pt felt urge to have BM, only bright red blood noted in BSC - no BM noted. Pt states that he does have a history of hemorrhoids and feels relieved after using BSC. Pt and wife stated that pt had one similar today but not as much blood noted. Rogue Bussing, NP notified. Stated ok to still give dulcolax suppository

## 2014-08-16 DIAGNOSIS — K649 Unspecified hemorrhoids: Secondary | ICD-10-CM | POA: Diagnosis present

## 2014-08-16 DIAGNOSIS — E876 Hypokalemia: Secondary | ICD-10-CM

## 2014-08-16 DIAGNOSIS — D692 Other nonthrombocytopenic purpura: Secondary | ICD-10-CM | POA: Diagnosis not present

## 2014-08-16 LAB — BASIC METABOLIC PANEL
Anion gap: 12 (ref 5–15)
BUN: 12 mg/dL (ref 6–23)
CALCIUM: 7.9 mg/dL — AB (ref 8.4–10.5)
CO2: 18 mmol/L — ABNORMAL LOW (ref 19–32)
Chloride: 98 mmol/L (ref 96–112)
Creatinine, Ser: 0.84 mg/dL (ref 0.50–1.35)
GFR calc Af Amer: >90 mL/min (ref >90)
GLUCOSE: 182 mg/dL — AB (ref 70–99)
Potassium: 3.4 mmol/L — ABNORMAL LOW (ref 3.5–5.1)
Sodium: 128 mmol/L — ABNORMAL LOW (ref 135–145)

## 2014-08-16 LAB — T3, FREE: T3, Free: 1.7 pg/mL — ABNORMAL LOW (ref 2.0–4.4)

## 2014-08-16 LAB — T4, FREE: FREE T4: 0.98 ng/dL (ref 0.80–1.80)

## 2014-08-16 LAB — CBC
HCT: 38.1 % — ABNORMAL LOW (ref 39.0–52.0)
Hemoglobin: 13.6 g/dL (ref 13.0–17.0)
MCH: 29.8 pg (ref 26.0–34.0)
MCHC: 35.7 g/dL (ref 30.0–36.0)
MCV: 83.6 fL (ref 78.0–100.0)
Platelets: 108 10*3/uL — ABNORMAL LOW (ref 150–400)
RBC: 4.56 MIL/uL (ref 4.22–5.81)
RDW: 12.4 % (ref 11.5–15.5)
WBC: 15.7 10*3/uL — AB (ref 4.0–10.5)

## 2014-08-16 LAB — HEPATITIS PANEL, ACUTE
HCV Ab: NEGATIVE
HEP A IGM: NONREACTIVE
Hep B C IgM: NONREACTIVE
Hepatitis B Surface Ag: NEGATIVE

## 2014-08-16 LAB — MAGNESIUM: Magnesium: 1.9 mg/dL (ref 1.5–2.5)

## 2014-08-16 LAB — PROTIME-INR
INR: 1.26 (ref 0.00–1.49)
Prothrombin Time: 15.9 seconds — ABNORMAL HIGH (ref 11.6–15.2)

## 2014-08-16 LAB — APTT: aPTT: 46 seconds — ABNORMAL HIGH (ref 24–37)

## 2014-08-16 LAB — C-REACTIVE PROTEIN: CRP: 15.2 mg/dL — ABNORMAL HIGH (ref <0.60)

## 2014-08-16 LAB — SEDIMENTATION RATE: Sed Rate: 83 mm/hr — ABNORMAL HIGH (ref 0–16)

## 2014-08-16 MED ORDER — TRAMADOL HCL 50 MG PO TABS
50.0000 mg | ORAL_TABLET | Freq: Two times a day (BID) | ORAL | Status: DC | PRN
  Administered 2014-08-17 – 2014-08-19 (×5): 50 mg via ORAL
  Filled 2014-08-16 (×6): qty 1

## 2014-08-16 MED ORDER — POTASSIUM CHLORIDE CRYS ER 20 MEQ PO TBCR
40.0000 meq | EXTENDED_RELEASE_TABLET | Freq: Three times a day (TID) | ORAL | Status: DC
Start: 2014-08-16 — End: 2014-08-17
  Administered 2014-08-16 – 2014-08-17 (×4): 40 meq via ORAL
  Filled 2014-08-16 (×5): qty 2

## 2014-08-16 MED ORDER — POLYETHYLENE GLYCOL 3350 17 G PO PACK
17.0000 g | PACK | Freq: Two times a day (BID) | ORAL | Status: DC
  Filled 2014-08-16 (×3): qty 1

## 2014-08-16 NOTE — Progress Notes (Signed)
Rogue Bussing, NP notified of "bulls-eye" rash around his neck. Pt with history of Katherina Right disease. Rogue Bussing, NP informed RN to notify if rash appears to be getting worse and spreads. No new orders received at this time, will continue to monitor.

## 2014-08-16 NOTE — Significant Event (Addendum)
Rapid Response Event Note Called by primary RN to see pt for new atypical rash to neck Overview: Time Called: 0224 Arrival Time: 0226 Event Type: Other (Comment)  Initial Focused Assessment: On arrival to room pt is very jittery but c/o of feeling sweaty. His wife states he has a hx of Kenneth Lipps -johnson's Cline & is concerned about the lesions that have recently appeared on his neck & are now increasing in size.  He is also having problems with constipation that has been resistant to treatment.  On his left neck at the edge of his beard line he has 2 large oval shaped lesions measuring roughly 2"x1" that are elevated and firm but mobile with an ecchymotic area on the skin surface.  He has 3 similar lesions on the posterior neck that are more circular in shape.  On his right neck he has a papular red rash covering an area 2"x2".  He is on steroids. The pt denies pain or itching with any of these areas.  Recommended to primary RN to ask for a CBC and coags and to have NP come evaluate pt.  Also recommended Linzess to assist with constipation.    Interventions: CBC  Event Summary: Name of Physician Notified: Kenneth Maudlin, NP at [phone]    at    Outcome: Stayed in room and stabalized     Kenneth Cline

## 2014-08-16 NOTE — Progress Notes (Signed)
Patient still straining with bowel movements; blood noted with BMs. Paged Rogue Bussing, NP  As patient requesting medications to help his hemorrhoids.

## 2014-08-16 NOTE — Progress Notes (Signed)
PROGRESS NOTE  Kenneth Cline WNU:[id number] DOB: Jun 16, 1955 DOA: 08/10/2014 PCP: Delia Chimes, NP  Assessment/Plan: Adrenal hemmorrhage:  related to surgery? -repeat CT scan shows b/l adrenal hemm -3.4 x 2.3 cm right adrenal hemorrhage, new. -3.4 x 2.5 cm left adrenal hemorrhage, increased. ACTH stim test positive- change to po cortef, renin/aldos pending. Will need outpatient follow-up with endocrinology. Plasma Normetanephrine slightly elevated with normal metanephrines. Not sure how to interpret this  Fever Resolved. UA, blood cultures negative. Chest x-ray without definite pneumonia. Minimal cough. None further. Dr. Gaynelle Adu note from 4/22 states start Levaquin, but this was not started. Patient prefers to avoid antibiotics given his history of Stevens-Johnson syndrome, though he has tolerated Levaquin in the past. Will hold off on antibiotics for now.  May be related to hemorrhage, constipation, atelectasis.  Constipation from narcotic use Had good results with SMOG enema.  Push potassium toward 5.0, to help with bowel motility.  Continue Mira lax. Stop Dulcolax suppositories and Senokot's. Limit narcotic use. Increase activity.  Bleeding hemorrhoids: Worsened from constipation. Started on steroid suppositories last night.  Purpuric rash: Does not look like Stevens-Johnson's which is what the nurses were concerned about. We'll do a vasculitis workup with ESR, CRP, hepatitis panel, cryoglobulins, ANA, Anka. Will need follow-up with dermatology as an outpatient for likely biopsy. Blood cultures to date remain negative but will also check an echocardiogram to rule out vegetation.  Fatigue +adrenal insuff from hemmorrhage Also, TSH high. Free T4 free T3 pending  N/V-- NG d/c Improved, but had an episode of vomiting today. Able to take pills.  HTN- PRN Medications until taking PO  Hyponatremia Likely from adrenal insufficiency, possibly also hypothyroidism. Continue cortef and  monitor. If free T3 free T4 low, start Synthroid as well.  Hypokalemia: Continue repletion  Elevated blood sugars -resolved  Thrombocytopenia -?etiology  Also has mildly elevated PTT:  Will check lupus anticoagulant panel.  Question factor deficiency. Might need hematology outpatient follow-up. We'll discuss tomorrow.  Recent shoulder surgery:  Consulted OT  Code Status: full Family Communication: wife at bedside Disposition Plan:   Consultants:  urology  Procedures:     HPI/Subjective: Started developing a rash on his neck last night. Not itchy. Has had some hematochezia attributed to his hemorrhoids. He had a good bowel movement after smog enema yesterday. Feels tired from being up all night.  Objective: Filed Vitals:   08/16/14 0625  BP:   Pulse: 100  Temp:   Resp:     Intake/Output Summary (Last 24 hours) at 08/16/14 1014 Last data filed at 08/16/14 0623  Gross per 24 hour  Intake    900 ml  Output    275 ml  Net    625 ml   Filed Weights   08/10/14 2005  Weight: 91.627 kg (202 lb)    Exam:   General:  Asleep. Arousable. weak appearing  Cardiovascular: rrr without murmurs gallops rubs  Respiratory: clear to auscultation without wheezes rhonchi or rales  Abdomen: Diminished BS, soft, nontender nondistended  Musculoskeletal: no edema. Steri-Strips on right shoulder noted.  Skin/integument: Raised purpuric area with surrounding mild erythema, 2 areas on the posterior neck and an area on the left neck. No blistering. Despite nursing notes, it is not typical targetoid lesion.  Data Reviewed: Basic Metabolic Panel:  Recent Labs Lab 08/10/14 2013 08/13/14 0908 08/13/14 2320 08/15/14 0540 08/16/14 0548  NA 126* 133* 131* 131* 128*  K 3.5 3.4* 3.0* 3.5 3.4*  CL 87* 98 97 98 98  CO2 26 24 23 20  18*  GLUCOSE 226* 89 157* 149* 182*  BUN 12 20 13 9 12   CREATININE 0.96 1.14 0.90 0.71 0.84  CALCIUM 9.2 8.8 8.5 8.4 7.9*  MG  --   --   --   --   1.9   Liver Function Tests:  Recent Labs Lab 08/11/14 0051 08/13/14 0908 08/13/14 2320  AST 26 21 32  ALT 28 20 25   ALKPHOS 63 74 94  BILITOT 1.6* 4.2* 2.1*  PROT 7.4 5.7* 6.0  ALBUMIN 3.4* 2.3* 2.5*    Recent Labs Lab 08/11/14 0051  LIPASE 25   No results for input(s): AMMONIA in the last 168 hours. CBC:  Recent Labs Lab 08/10/14 2013 08/13/14 0908 08/13/14 2320 08/15/14 0540 08/16/14 0548  WBC 5.7 4.9 5.4 8.6 15.7*  NEUTROABS  --   --  3.1  --   --   HGB 14.3 12.7* 12.9* 12.1* 13.6  HCT 39.9 36.6* 36.5* 33.3* 38.1*  MCV 84.4 85.9 84.5 82.0 83.6  PLT 109* 102* 133* 129* 108*   Cardiac Enzymes: No results for input(s): CKTOTAL, CKMB, CKMBINDEX, TROPONINI in the last 168 hours. BNP (last 3 results)  Recent Labs  08/10/14 2013  BNP 22.5    ProBNP (last 3 results) No results for input(s): PROBNP in the last 8760 hours.  CBG:  Recent Labs Lab 08/13/14 1156 08/13/14 1648 08/13/14 2105 08/14/14 0749 08/14/14 1211  GLUCAP 76 137* 127* 115* 132*    Recent Results (from the past 240 hour(s))  Culture, blood (routine x 2)     Status: None (Preliminary result)   Collection Time: 08/13/14 11:20 PM  Result Value Ref Range Status   Specimen Description BLOOD LEFT ARM  Final   Special Requests   Final    BOTTLES DRAWN AEROBIC ONLY 10CCS BLUE NO ANTIBIOTICS   Culture   Final           BLOOD CULTURE RECEIVED NO GROWTH TO DATE CULTURE WILL BE HELD FOR 5 DAYS BEFORE ISSUING A FINAL NEGATIVE REPORT Performed at [facility]    Report Status PENDING  Incomplete  Culture, blood (routine x 2)     Status: None (Preliminary result)   Collection Time: 08/13/14 11:30 PM  Result Value Ref Range Status   Specimen Description BLOOD LEFT HAND  Final   Special Requests BOTTLES DRAWN AEROBIC ONLY 5CCS BLUE  Final   Culture   Final           BLOOD CULTURE RECEIVED NO GROWTH TO DATE CULTURE WILL BE HELD FOR 5 DAYS BEFORE ISSUING A FINAL NEGATIVE  REPORT Performed at [facility]    Report Status PENDING  Incomplete  Culture, Urine     Status: None   Collection Time: 08/14/14 12:11 AM  Result Value Ref Range Status   Specimen Description URINE, CATHETERIZED  Final   Special Requests Normal  Final   Colony Count NO GROWTH Performed at [facility]   Final   Culture NO GROWTH Performed at [facility]   Final   Report Status 08/15/2014 FINAL  Final     Studies: No results found.  Scheduled Meds: . fluticasone  1 spray Each Nare Daily  . hydrocortisone  20 mg Oral BID  . pantoprazole  40 mg Oral Daily  . polyethylene glycol  17 g Oral Daily  . potassium chloride  40 mEq Oral TID   Continuous Infusions:   Antibiotics Given (last 72 hours)  None      Active Problems:   Hypertension, essential, benign   Adrenal hemorrhage   Adrenal insufficiency   Hyponatremia   Hypokalemia   Fever   Constipation   Purpura   Bleeding hemorrhoids    Time spent: 25 min    [facility] Pager [phone] [email or web address], password [email or web address] 08/16/2014, 10:14 AM  LOS: 5 days

## 2014-08-17 DIAGNOSIS — R509 Fever, unspecified: Secondary | ICD-10-CM

## 2014-08-17 DIAGNOSIS — D692 Other nonthrombocytopenic purpura: Secondary | ICD-10-CM

## 2014-08-17 DIAGNOSIS — E039 Hypothyroidism, unspecified: Secondary | ICD-10-CM | POA: Diagnosis present

## 2014-08-17 LAB — CBC WITH DIFFERENTIAL/PLATELET
Basophils Absolute: 0 10*3/uL (ref 0.0–0.1)
Basophils Relative: 0 % (ref 0–1)
Eosinophils Absolute: 0.2 10*3/uL (ref 0.0–0.7)
Eosinophils Relative: 1 % (ref 0–5)
HEMATOCRIT: 35.1 % — AB (ref 39.0–52.0)
HEMOGLOBIN: 12.6 g/dL — AB (ref 13.0–17.0)
LYMPHS PCT: 7 % — AB (ref 12–46)
Lymphs Abs: 1.4 10*3/uL (ref 0.7–4.0)
MCH: 30.3 pg (ref 26.0–34.0)
MCHC: 35.9 g/dL (ref 30.0–36.0)
MCV: 84.4 fL (ref 78.0–100.0)
MONOS PCT: 9 % (ref 3–12)
Monocytes Absolute: 1.8 10*3/uL — ABNORMAL HIGH (ref 0.1–1.0)
Neutro Abs: 16.1 10*3/uL — ABNORMAL HIGH (ref 1.7–7.7)
Neutrophils Relative %: 83 % — ABNORMAL HIGH (ref 43–77)
Platelets: 109 10*3/uL — ABNORMAL LOW (ref 150–400)
RBC: 4.16 MIL/uL — AB (ref 4.22–5.81)
RDW: 13.1 % (ref 11.5–15.5)
WBC: 19.5 10*3/uL — AB (ref 4.0–10.5)

## 2014-08-17 LAB — BASIC METABOLIC PANEL
Anion gap: 11 (ref 5–15)
BUN: 15 mg/dL (ref 6–23)
CALCIUM: 7.9 mg/dL — AB (ref 8.4–10.5)
CO2: 16 mmol/L — ABNORMAL LOW (ref 19–32)
Chloride: 101 mmol/L (ref 96–112)
Creatinine, Ser: 1.02 mg/dL (ref 0.50–1.35)
GFR calc non Af Amer: 79 mL/min — ABNORMAL LOW (ref >90)
GLUCOSE: 154 mg/dL — AB (ref 70–99)
Potassium: 4.5 mmol/L (ref 3.5–5.1)
Sodium: 128 mmol/L — ABNORMAL LOW (ref 135–145)

## 2014-08-17 LAB — CLOSTRIDIUM DIFFICILE BY PCR: Toxigenic C. Difficile by PCR: NEGATIVE

## 2014-08-17 LAB — HIV ANTIBODY (ROUTINE TESTING W REFLEX): HIV Screen 4th Generation wRfx: NONREACTIVE

## 2014-08-17 MED ORDER — PANTOPRAZOLE SODIUM 40 MG PO TBEC
40.0000 mg | DELAYED_RELEASE_TABLET | Freq: Every day | ORAL | Status: DC
  Administered 2014-08-18: 40 mg via ORAL
  Filled 2014-08-17: qty 1

## 2014-08-17 MED ORDER — POLYETHYLENE GLYCOL 3350 17 G PO PACK
17.0000 g | PACK | Freq: Every day | ORAL | Status: DC | PRN
  Filled 2014-08-17: qty 1

## 2014-08-17 MED ORDER — LEVOTHYROXINE SODIUM 25 MCG PO TABS
25.0000 ug | ORAL_TABLET | Freq: Every day | ORAL | Status: DC
  Administered 2014-08-18 – 2014-08-20 (×3): 25 ug via ORAL
  Filled 2014-08-17 (×4): qty 1

## 2014-08-17 NOTE — Progress Notes (Signed)
Physical Therapy Treatment Patient Details Name: Kenneth Cline MRN: [id number] DOB: [birth year] Today's Date: 08/17/2014    History of Present Illness 59 y.o. male admitted with adrenal hemorrhage    PT Comments    Pt is getting therapy today to assess stairs and to continue as planned.  He is up with wife in hallway walking a great deal with no assistance from her.  Asked her to give CGA to climb stairs to enter house as he had a brief moment stubbing toe ascending stairs today.  He is able to catch himself but assured them that being safe was better than risking damage to the healing R shoulder.  Follow Up Recommendations  Outpatient PT;Other (comment) (when surgeon dictates to continue therapy in his office)     Equipment Recommendations  None recommended by PT    Recommendations for Other Services       Precautions / Restrictions Precautions Precautions: Shoulder (Rotator cuff repair) Type of Shoulder Precautions: rotator cuff Shoulder Interventions: Shoulder sling/immobilizer Precaution Booklet Issued: Yes (comment) Precaution Comments: Discussed NWB RUE Restrictions Weight Bearing Restrictions: Yes RUE Weight Bearing: Non weight bearing    Mobility  Bed Mobility               General bed mobility comments: up in chair  Transfers Overall transfer level: Modified independent Equipment used: None Transfers: Sit to/from [facility] Sit to Stand: Supervision Stand pivot transfers: Supervision       General transfer comment: Pt is stiff in ankles and affecting control of initial stand  Ambulation/Gait Ambulation/Gait assistance: Supervision Ambulation Distance (Feet): 200 Feet Assistive device: None Gait Pattern/deviations: Step-through pattern;Wide base of support;Drifts right/left Gait velocity: normal Gait velocity interpretation: at or above normal speed for age/gender General Gait Details: Pt needed reminding about clearing obstacles  on the hall, esp to avoid    Stairs Stairs: Yes Stairs assistance: Min guard Stair Management: No rails Number of Stairs: 13 General stair comments: Pt has stairs with no rails at home and needed to practice the task, stubbed his toe early on and educated wife to give him cga to get into house  Wheelchair Mobility    Modified Rankin (Stroke Patients Only)       Balance Overall balance assessment: Modified Independent Sitting-balance support: Feet supported Sitting balance-Leahy Scale: Normal     Standing balance support: No upper extremity supported Standing balance-Leahy Scale: Good                      Cognition Arousal/Alertness: Awake/alert Behavior During Therapy: WFL for tasks assessed/performed Overall Cognitive Status: Within Functional Limits for tasks assessed                      Exercises      General Comments General comments (skin integrity, edema, etc.): Stairs were a moment of concern but did ask wife to offer cga to get into house with there being no rails at door.  He and she are fine with this.      Pertinent Vitals/Pain Pain Assessment: Faces Pain Score: 2  Faces Pain Scale: Hurts a little bit Pain Location: R shoulder Pain Intervention(s): Monitored during session;Repositioned;Premedicated before session    Home Living                      Prior Function            PT Goals (current goals can now be found  in the care plan section) Acute Rehab PT Goals Patient Stated Goal: not stated Progress towards PT goals: Progressing toward goals    Frequency  Min 3X/week    PT Plan Current plan remains appropriate    Co-evaluation             End of Session   Activity Tolerance: Patient tolerated treatment well Patient left: in chair;with call bell/phone within reach;with family/visitor present     Time: 1642-1700 PT Time Calculation (min) (ACUTE ONLY): 18 min  Charges:  $Gait Training: 8-22 mins                     G Codes:      Ramond Dial 08-25-14, 5:19 PM   Mee Hives, PT MS Acute Rehab Dept. Number: [phone]

## 2014-08-17 NOTE — Discharge Instructions (Signed)
SHOULDER : Continue AAROM and AROM right shoulder and arm with ADL focus. No weight bearing though with the right arm. Follow up with Veverly Fells next week  [phone]  Will need to get home health OT/PT set up

## 2014-08-17 NOTE — Progress Notes (Addendum)
PROGRESS NOTE  Nedim Oki LKT:[id number] DOB: 03-Dec-1955 DOA: 08/10/2014 PCP: Delia Chimes, NP  Assessment/Plan: Adrenal hemmorrhage with adrenal insufficiency:  related to surgery? -repeat CT scan shows b/l adrenal hemm -3.4 x 2.3 cm right adrenal hemorrhage, new. -3.4 x 2.5 cm left adrenal hemorrhage, increased. ACTH stim test positive- changed to po cortef, renin/aldos pending. Will need outpatient follow-up with endocrinology.  Have tried to set up with Dr. Buddy Duty Plasma Normetanephrine slightly elevated with normal metanephrines. Not sure how to interpret this  Fever Resolved. UA, blood cultures negative. Chest x-ray without definite pneumonia. Minimal cough. None further. Dr. Gaynelle Adu note from 4/22 states start Levaquin, but this was not started. Patient prefers to avoid antibiotics given his history of Stevens-Johnson syndrome, though he has tolerated Levaquin in the past. Will hold off on antibiotics for now.  May be related to hemorrhage, constipation, atelectasis. Echo pending  Constipation from narcotic use Resolved.   Now with diarrhea >10 times overnight.  Likely just related to laxatives which have been stopped and retained stool, but will check c diff pcr, since WBC increasing.  Bleeding hemorrhoids: bleeding has stopped. Continue anusol suppositories  Purpuric rash: Does not look like Stevens-Johnson's which is what the nurses were concerned about.  vasculitis workup underway. ESR 83, CRP 15, hepatitis panel, cryoglobulins, ANA, Anca. Will need follow-up with dermatology as an outpatient for likely biopsy.  Have personally arranged with Dr. Edmonia Lynch for Monday. Blood cultures to date remain negative but will also check an echocardiogram to rule out vegetation.  Hypothyroidism:  TSH above 7. Free T3 low.  Will start Synthroid.  HTN- controlled  Hyponatremia Likely from adrenal insufficiency,  hypothyroidism. Continue cortef and monitor. Start Synthroid.  Hypokalemia:  Resolved. Will stop potassium tablets as this will likely worsen his diarrhea.  Elevated blood sugars -resolved  Thrombocytopenia -?etiology  Also has mildly elevated PTT:  Will check lupus anticoagulant panel.  Question factor deficiency. Might need hematology outpatient follow-up.   Recent shoulder surgery:  Consulted OT  Code Status: full Family Communication: wife at bedside Disposition Plan:  Home soon if C. difficile negative and echo without vegetation.   Consultants:  urology  Procedures:     HPI/Subjective: Rash improving. No new areas. Had some nausea this morning, but eating okay. Having multiple sometimes blood-tinged watery stools. More than 10 since yesterday since being off laxatives since yesterday. Abdominal pain resolved. Wife is concerned about the amount of stools he is having.  Objective: Filed Vitals:   08/17/14 0615  BP: 130/78  Pulse: 108  Temp: 98.4 F (36.9 C)  Resp: 22    Intake/Output Summary (Last 24 hours) at 08/17/14 1136 Last data filed at 08/17/14 0959  Gross per 24 hour  Intake    720 ml  Output      0 ml  Net    720 ml   Filed Weights   08/10/14 2005  Weight: 91.627 kg (202 lb)    Exam:   General:  Seen earlier walking in the hall without difficulty. Now in bed asleep but arousable.  Cardiovascular: rrr without murmurs gallops rubs  Respiratory: clear to auscultation without wheezes rhonchi or rales  Abdomen: Diminished BS, soft, nontender nondistended  Musculoskeletal: no edema. Steri-Strips on right shoulder noted.  Skin/integument: Raised purpuric area with surrounding mild erythema, 2 areas on the posterior neck and an area on the left neck. No blistering. Despite nursing notes, it is not typical targetoid lesion.  Data Reviewed: Basic Metabolic Panel:  Recent Labs  Lab 08/13/14 0908 08/13/14 2320 08/15/14 0540 08/16/14 0548 08/17/14 0500  NA 133* 131* 131* 128* 128*  K 3.4* 3.0* 3.5 3.4* 4.5  CL 98 97  98 98 101  CO2 _0 18* 16*  GLUCOSE 89 157* 149* 182* 154*  BUN _1 CREATININE 1.14 0.90 0.71 0.84 1.02  CALCIUM 8.8 8.5 8.4 7.9* 7.9*  MG  --   --   --  1.9  --    Liver Function Tests:  Recent Labs Lab 08/11/14 0051 08/13/14 0908 08/13/14 2320  AST 26 21 32  ALT _2 ALKPHOS 63 74 94  BILITOT 1.6* 4.2* 2.1*  PROT 7.4 5.7* 6.0  ALBUMIN 3.4* 2.3* 2.5*    Recent Labs Lab 08/11/14 0051  LIPASE 25   No results for input(s): AMMONIA in the last 168 hours. CBC:  Recent Labs Lab 08/13/14 0908 08/13/14 2320 08/15/14 0540 08/16/14 0548 08/17/14 0500  WBC 4.9 5.4 8.6 15.7* 19.5*  NEUTROABS  --  3.1  --   --  PENDING  HGB 12.7* 12.9* 12.1* 13.6 12.6*  HCT 36.6* 36.5* 33.3* 38.1* 35.1*  MCV 85.9 84.5 82.0 83.6 84.4  PLT 102* 133* 129* 108* 109*   Cardiac Enzymes: No results for input(s): CKTOTAL, CKMB, CKMBINDEX, TROPONINI in the last 168 hours. BNP (last 3 results)  Recent Labs  08/10/14 2013  BNP 22.5    ProBNP (last 3 results) No results for input(s): PROBNP in the last 8760 hours.  CBG:  Recent Labs Lab 08/13/14 1156 08/13/14 1648 08/13/14 2105 08/14/14 0749 08/14/14 1211  GLUCAP 76 137* 127* 115* 132*    Recent Results (from the past 240 hour(s))  Culture, blood (routine x 2)     Status: None (Preliminary result)   Collection Time: 08/13/14 11:20 PM  Result Value Ref Range Status   Specimen Description BLOOD LEFT ARM  Final   Special Requests   Final    BOTTLES DRAWN AEROBIC ONLY 10CCS BLUE NO ANTIBIOTICS   Culture   Final           BLOOD CULTURE RECEIVED NO GROWTH TO DATE CULTURE WILL BE HELD FOR 5 DAYS BEFORE ISSUING A FINAL NEGATIVE REPORT Performed at [facility]    Report Status PENDING  Incomplete  Culture, blood (routine x 2)     Status: None (Preliminary result)   Collection Time: 08/13/14 11:30 PM  Result Value Ref Range Status   Specimen Description BLOOD LEFT HAND  Final   Special Requests BOTTLES  DRAWN AEROBIC ONLY 5CCS BLUE  Final   Culture   Final           BLOOD CULTURE RECEIVED NO GROWTH TO DATE CULTURE WILL BE HELD FOR 5 DAYS BEFORE ISSUING A FINAL NEGATIVE REPORT Performed at [facility]    Report Status PENDING  Incomplete  Culture, Urine     Status: None   Collection Time: 08/14/14 12:11 AM  Result Value Ref Range Status   Specimen Description URINE, CATHETERIZED  Final   Special Requests Normal  Final   Colony Count NO GROWTH Performed at [facility]   Final   Culture NO GROWTH Performed at [facility]   Final   Report Status 08/15/2014 FINAL  Final     Studies: No results found.  Scheduled Meds: . fluticasone  1 spray Each Nare Daily  . hydrocortisone  20 mg Oral BID  . [START ON 08/18/2014] levothyroxine  25 mcg Oral QAC breakfast  . [START ON 08/18/2014] pantoprazole  40 mg Oral Daily   Continuous Infusions:   Antibiotics Given (last 72 hours)    None      Principal Problem:   Adrenal hemorrhage Active Problems:   Hypertension, essential, benign   Adrenal insufficiency   Hyponatremia   Hypokalemia   Fever   Constipation   Purpura   Bleeding hemorrhoids   Hypothyroidism    Time spent: 45 min    [facility] Pager [phone] [email or web address], password [facility] 08/17/2014, 11:36 AM  LOS: 6 days

## 2014-08-17 NOTE — Progress Notes (Signed)
  Echocardiogram 2D Echocardiogram has been performed.  Kenneth Cline FRANCES 08/17/2014, 3:58 PM

## 2014-08-17 NOTE — Evaluation (Signed)
Occupational Therapy Evaluation Patient Details Name: Kenneth Cline MRN: [id number] DOB: Oct 06, 1955 Today's Date: 08/17/2014    History of Present Illness 59 y.o. male admitted with adrenal hemorrhage. Pt with recent right rotator cuff surgery.   Clinical Impression   Pt admitted with above. Pt requiring assist with ADLs since recent shoulder surgery. Feel pt will benefit from acute OT to increase independence and address RUE prior to d/c.    Follow Up Recommendations  No OT follow up;Supervision - Intermittent    Equipment Recommendations  Tub/shower seat    Recommendations for Other Services       Precautions / Restrictions Precautions Precautions: Shoulder;Other (comment) (recent right rotator cuff surgery) Shoulder Interventions: Shoulder sling/immobilizer (sling in public per pt) Precaution Booklet Issued: Yes (comment) Precaution Comments: Discussed NWB RUE Required Braces or Orthoses: Sling Restrictions Weight Bearing Restrictions: Yes RUE Weight Bearing: Non weight bearing      Mobility Bed Mobility               General bed mobility comments: not assessed  Transfers Overall transfer level: Modified independent                    Balance  No apparent balance deficits-not formally assessed.                                          ADL Overall ADL's : Needs assistance/impaired                 Upper Body Dressing : Minimal assistance;Sitting   Lower Body Dressing: Moderate assistance;Sit to/from stand   Toilet Transfer: Ambulation;Supervision/safety (chair)           Functional mobility during ADLs: Supervision/safety General ADL Comments: Discussed getting shower chair. Educated on safety such as sitting for most of LB ADLs.      Vision     Perception     Praxis      Pertinent Vitals/Pain Pain Assessment: 0-10 Pain Score: 4  Pain Location: RUE and buttocks Pain Descriptors / Indicators: Aching  (aching in RUE) Pain Intervention(s): Monitored during session;Repositioned     Hand Dominance Right   Extremity/Trunk Assessment Upper Extremity Assessment Upper Extremity Assessment: RUE deficits/detail RUE Deficits / Details: recent Rt rotator cuff surgery   Lower Extremity Assessment Lower Extremity Assessment: Defer to PT evaluation       Communication Communication Communication: No difficulties   Cognition Arousal/Alertness: Awake/alert Behavior During Therapy: WFL for tasks assessed/performed Overall Cognitive Status: Within Functional Limits for tasks assessed                     General Comments       Exercises Exercises: Other exercises;Shoulder Other Exercises Other Exercises: performed approximately 10 reps each of right wrist flexion/extension and digit composite flexion/extension   Shoulder Instructions Shoulder Instructions Donning/doffing shirt without moving shoulder:  (educated on technique for UB dressing and clothing that may be easier to manage) Donning/doffing sling/immobilizer: Supervision/safety Correct positioning of sling/immobilizer: Supervision/safety (educated and pt practiced) ROM for elbow, wrist and digits of operated UE: Supervision/safety (educated and pt performed wrist/hand ROM) Sling wearing schedule (on at all times/off for ADL's):  (pt verbalized it is for in public) Proper positioning of operated UE when showering:  (educated on technique) Positioning of UE while sleeping:  (educated)    Home Living Family/patient expects to be  discharged to:: Private residence Living Arrangements: Spouse/significant other Available Help at Discharge: Family;Available PRN/intermittently Type of Home: House Home Access: Stairs to enter [employer] of Steps: 3 Entrance Stairs-Rails: None Home Layout: One level     Bathroom Shower/Tub: Tub/shower unit;Walk-in shower   Bathroom Toilet: Handicapped height     Home  Equipment: None          Prior Functioning/Environment Level of Independence: Needs assistance  Gait / Transfers Assistance Needed: independent ADL's / Homemaking Assistance Needed: Wife has been assisting with bath/dress since rotator cuff surgery a couple of weeks ago   Comments: Independent prior to surgery for rotator cuff.    OT Diagnosis: Acute pain   OT Problem List: Decreased knowledge of use of DME or AE;Decreased knowledge of precautions;Impaired UE functional use;Decreased strength;Decreased range of motion;Pain   OT Treatment/Interventions: Self-care/ADL training;DME and/or AE instruction;Therapeutic exercise;Therapeutic activities;Patient/family education;Balance training    OT Goals(Current goals can be found in the care plan section) Acute Rehab OT Goals Patient Stated Goal: not stated OT Goal Formulation: With patient/family Time For Goal Achievement: 08/24/14 Potential to Achieve Goals: Good ADL Goals Pt Will Perform Upper Body Dressing: with set-up;sitting Pt Will Perform Lower Body Dressing: sit to/from stand;with min assist Additional ADL Goal #1: Pt will independently perform HEP for RUE.   OT Frequency: Min 2X/week   Barriers to D/C:            Co-evaluation              End of Session Equipment Utilized During Treatment: Gait belt;Other (comment) (sling)  Activity Tolerance: Patient limited by pain Patient left: in chair;with call bell/phone within reach;with family/visitor present   Time: 0175-1025 OT Time Calculation (min): 19 min Charges:  OT General Charges $OT Visit: 1 Procedure OT Evaluation $Initial OT Evaluation Tier I: 1 Procedure G-CodesBenito Mccreedy OTR/L [phone] 08/17/2014, 11:01 AM

## 2014-08-17 NOTE — Progress Notes (Signed)
Orthopedics Progress Note  Subjective: My shoulder is feeling better and I might get to go home tomorrow  Objective:  Filed Vitals:   08/17/14 1525  BP: 126/69  Pulse: 108  Temp: 98.3 F (36.8 C)  Resp: 16    General: Awake and alert  Musculoskeletal: right shoulder incision clean and dry and intact, healing well. No erythema, no swelling, AAROM 0-90 FE, ER to 0, IR abdomen Neurovascularly intact  Lab Results  Component Value Date   WBC 19.5* 08/17/2014   HGB 12.6* 08/17/2014   HCT 35.1* 08/17/2014   MCV 84.4 08/17/2014   PLT 109* 08/17/2014       Component Value Date/Time   NA 128* 08/17/2014 0500   K 4.5 08/17/2014 0500   CL 101 08/17/2014 0500   CO2 16* 08/17/2014 0500   GLUCOSE 154* 08/17/2014 0500   BUN 15 08/17/2014 0500   CREATININE 1.02 08/17/2014 0500   CALCIUM 7.9* 08/17/2014 0500   GFRNONAA 79* 08/17/2014 0500   GFRAA >90 08/17/2014 0500    Lab Results  Component Value Date   INR 1.26 08/16/2014   INR 1.14 08/11/2014    Assessment/Plan:  s/p right shoulder repair with abdominal issues including adrenal hemorrhage.  From his shoulder standpoint, Mr Pryce is doing very well. Will set up home health OT to decrease the stress on him (ie not travelling to PT) Follow up with me next week.  [place]. Veverly Fells, MD 08/17/2014 6:15 PM

## 2014-08-18 DIAGNOSIS — R197 Diarrhea, unspecified: Secondary | ICD-10-CM | POA: Diagnosis present

## 2014-08-18 LAB — LUPUS ANTICOAGULANT PANEL
DRVVT: 115.3 s — AB (ref 0.0–55.1)
PTT Lupus Anticoagulant: 78.7 s — ABNORMAL HIGH (ref 0.0–50.0)

## 2014-08-18 LAB — DRVVT CONFIRM: DRVVT CONFIRM: 2.2 ratio — AB (ref 0.0–1.4)

## 2014-08-18 LAB — CBC WITH DIFFERENTIAL/PLATELET
Basophils Absolute: 0 10*3/uL (ref 0.0–0.1)
Basophils Relative: 0 % (ref 0–1)
EOS ABS: 0.3 10*3/uL (ref 0.0–0.7)
Eosinophils Relative: 2 % (ref 0–5)
HEMATOCRIT: 32.9 % — AB (ref 39.0–52.0)
HEMOGLOBIN: 11.3 g/dL — AB (ref 13.0–17.0)
LYMPHS ABS: 1.2 10*3/uL (ref 0.7–4.0)
Lymphocytes Relative: 8 % — ABNORMAL LOW (ref 12–46)
MCH: 29.4 pg (ref 26.0–34.0)
MCHC: 34.3 g/dL (ref 30.0–36.0)
MCV: 85.7 fL (ref 78.0–100.0)
Monocytes Absolute: 1.4 10*3/uL — ABNORMAL HIGH (ref 0.1–1.0)
Monocytes Relative: 9 % (ref 3–12)
NEUTROS ABS: 12.2 10*3/uL — AB (ref 1.7–7.7)
NEUTROS PCT: 81 % — AB (ref 43–77)
Platelets: 137 10*3/uL — ABNORMAL LOW (ref 150–400)
RBC: 3.84 MIL/uL — AB (ref 4.22–5.81)
RDW: 13.1 % (ref 11.5–15.5)
WBC: 15.1 10*3/uL — AB (ref 4.0–10.5)

## 2014-08-18 LAB — BASIC METABOLIC PANEL
Anion gap: 14 (ref 5–15)
BUN: 16 mg/dL (ref 6–23)
CO2: 16 mmol/L — ABNORMAL LOW (ref 19–32)
Calcium: 8 mg/dL — ABNORMAL LOW (ref 8.4–10.5)
Chloride: 99 mmol/L (ref 96–112)
Creatinine, Ser: 1.08 mg/dL (ref 0.50–1.35)
GFR calc Af Amer: 85 mL/min — ABNORMAL LOW (ref >90)
GFR, EST NON AFRICAN AMERICAN: 73 mL/min — AB (ref >90)
Glucose, Bld: 120 mg/dL — ABNORMAL HIGH (ref 70–99)
POTASSIUM: 4 mmol/L (ref 3.5–5.1)
SODIUM: 129 mmol/L — AB (ref 135–145)

## 2014-08-18 LAB — DRVVT MIX: dRVVT Mix: 80.4 s — ABNORMAL HIGH (ref 0.0–45.4)

## 2014-08-18 LAB — ANCA TITERS: C-ANCA: <1:20 {titer}

## 2014-08-18 LAB — ALDOSTERONE + RENIN ACTIVITY W/ RATIO
ALDO / PRA Ratio: <0.2 (ref 0.0–30.0)
Aldosterone: <1 ng/dL (ref 0.0–30.0)
PRA LC/MS/MS: 4.16 ng/mL/hr

## 2014-08-18 LAB — HEXAGONAL PHASE PHOSPHOLIPID: Hexagonal Phase Phospholipid: 45.6 s — ABNORMAL HIGH (ref 0.0–8.0)

## 2014-08-18 LAB — PTT-LA MIX: PTT-LA Mix: 73.5 s — ABNORMAL HIGH (ref 0.0–50.0)

## 2014-08-18 MED ORDER — SODIUM CHLORIDE 0.9 % IV SOLN
INTRAVENOUS | Status: DC
Start: 2014-08-18 — End: 2014-08-19
  Administered 2014-08-18: 14:00:00 via INTRAVENOUS

## 2014-08-18 MED ORDER — SIMETHICONE 80 MG PO CHEW
80.0000 mg | CHEWABLE_TABLET | Freq: Three times a day (TID) | ORAL | Status: DC
  Administered 2014-08-18 – 2014-08-20 (×9): 80 mg via ORAL
  Filled 2014-08-18 (×12): qty 1

## 2014-08-18 MED ORDER — CALCIUM CARBONATE ANTACID 500 MG PO CHEW
2.0000 | CHEWABLE_TABLET | Freq: Four times a day (QID) | ORAL | Status: DC | PRN
  Administered 2014-08-19: 400 mg via ORAL
  Filled 2014-08-18 (×3): qty 2

## 2014-08-18 MED ORDER — LOPERAMIDE HCL 2 MG PO CAPS
2.0000 mg | ORAL_CAPSULE | Freq: Two times a day (BID) | ORAL | Status: DC
  Administered 2014-08-18 – 2014-08-20 (×5): 2 mg via ORAL
  Filled 2014-08-18 (×6): qty 1

## 2014-08-18 MED ORDER — PANTOPRAZOLE SODIUM 40 MG PO TBEC
40.0000 mg | DELAYED_RELEASE_TABLET | Freq: Two times a day (BID) | ORAL | Status: DC
  Administered 2014-08-18 – 2014-08-20 (×4): 40 mg via ORAL
  Filled 2014-08-18 (×3): qty 1

## 2014-08-18 MED ORDER — HYDROCORTISONE 20 MG PO TABS
40.0000 mg | ORAL_TABLET | Freq: Two times a day (BID) | ORAL | Status: DC
  Administered 2014-08-18 – 2014-08-20 (×4): 40 mg via ORAL
  Filled 2014-08-18 (×5): qty 2

## 2014-08-18 MED ORDER — NYSTATIN 100000 UNIT/GM EX CREA
TOPICAL_CREAM | Freq: Two times a day (BID) | CUTANEOUS | Status: DC
  Administered 2014-08-18 – 2014-08-20 (×5): via TOPICAL
  Filled 2014-08-18: qty 15

## 2014-08-18 NOTE — Progress Notes (Addendum)
PROGRESS NOTE  Kenneth Cline KAJ:[id number] DOB: 08-27-1955 DOA: 08/10/2014 PCP: Delia Chimes, NP  Assessment/Plan: Adrenal hemmorrhage with adrenal insufficiency:  related to surgery? -repeat CT scan shows b/l adrenal hemm -3.4 x 2.3 cm right adrenal hemorrhage, new. -3.4 x 2.5 cm left adrenal hemorrhage, increased. ACTH stim test positive- changed to po cortef, renin/aldos pending. Will need outpatient follow-up with endocrinology. Tried to set up with follow-up with Dr. Buddy Duty.  He does not take patient's insurance. Wife to call Maryanna Shape Plasma Normetanephrine slightly elevated with normal metanephrines. Not sure how to interpret this  Fever, single episode Resolved. UA, blood cultures negative. Chest x-ray without definite pneumonia. Minimal cough. None further. Dr. Gaynelle Adu note from 4/22 states start Levaquin, but this was not started. Patient prefers to avoid antibiotics given his history of Stevens-Johnson syndrome, though he has tolerated Levaquin in the past. Antibiotics deferred..  May be related to hemorrhage, constipation, atelectasis. Echo without vegetation. See below  Constipation from narcotic use Resolved.   Still with diarrhea >10 times overnight.  Likely just related to laxatives which have been stopped and retained stool, but will check GI pathogen panel. C. difficile PCR negative. Will give IV fluids, Imodium cautiously.  Question related to his renal insufficiency? Will increase double Cortef to 40 mg twice a day.  Gastroesophageal reflux disease: Complaining of worsening bloating, poor appetite, heartburn. Will change Protonix to twice a day. Add Tums when necessary per wife's request, and schedule simethicone.  Bleeding hemorrhoids: bleeding has stopped. Continue anusol suppositories  Purpuric rash: Does not look like Stevens-Johnson's which is what the nurses were concerned about.  vasculitis workup underway. ESR 83, CRP 15, hepatitis panel negative, HIV nonreactive.  cryoglobulins, ANA, Anca. Will need follow-up with dermatology as an outpatient for likely biopsy.  Have personally arranged with Dr. Edmonia Lynch for Monday and placed on a AVS. Blood cultures to date remain negative. Transthoracic echocardiogram showed no vegetations.  Hypothyroidism:  TSH above 7. Free T3 low.  Will start Synthroid.  HTN- controlled  Hyponatremia Likely from adrenal insufficiency,  hypothyroidism. Continue cortef and monitor. Started Synthroid.  Hypokalemia: Resolved.   Elevated blood sugars -resolved  Thrombocytopenia -?etiology  Also has mildly elevated PTT:  Will check lupus anticoagulant panel.  Question factor deficiency. Might need hematology outpatient follow-up.   Recent shoulder surgery:  Consulted OT.  Dr. Veverly Fells made a follow-up visit  Code Status: full Family Communication: wife at bedside Disposition Plan:  Home soon if diarrhea resolves   Consultants:  Urology  Orthopedics  Procedures:     HPI/Subjective: Rash improving. No new areas. Not eating well. Having 10-20 watery stools over the past 24 hours. No further bleeding from hemorrhoids. Complaining of gastric bloating and heartburn.  Objective: Filed Vitals:   08/18/14 0738  BP: 116/66  Pulse: 71  Temp: 98.9 F (37.2 C)  Resp: 23    Intake/Output Summary (Last 24 hours) at 08/18/14 1047 Last data filed at 08/17/14 1800  Gross per 24 hour  Intake    480 ml  Output      0 ml  Net    480 ml   Filed Weights   08/10/14 2005  Weight: 91.627 kg (202 lb)    Exam:   General:  Asleep. Arousable.  Cardiovascular: rrr without murmurs gallops rubs  Respiratory: clear to auscultation without wheezes rhonchi or rales  Abdomen: Diminished BS, soft, nontender nondistended  Musculoskeletal: no edema. Steri-Strips on right shoulder noted. Skin/integument: Raised purpuric area with surrounding mild erythema, 2  areas on the posterior neck and an area on the left neck. No blistering.    Data Reviewed: Basic Metabolic Panel:  Recent Labs Lab 08/13/14 2320 08/15/14 0540 08/16/14 0548 08/17/14 0500 08/18/14 0700  NA 131* 131* 128* 128* 129*  K 3.0* 3.5 3.4* 4.5 4.0  CL 97 98 98 101 99  CO2 23 20 18* 16* 16*  GLUCOSE 157* 149* 182* 154* 120*  BUN 13 9 12 15 16   CREATININE 0.90 0.71 0.84 1.02 1.08  CALCIUM 8.5 8.4 7.9* 7.9* 8.0*  MG  --   --  1.9  --   --    Liver Function Tests:  Recent Labs Lab 08/13/14 0908 08/13/14 2320  AST 21 32  ALT 20 25  ALKPHOS 74 94  BILITOT 4.2* 2.1*  PROT 5.7* 6.0  ALBUMIN 2.3* 2.5*   No results for input(s): LIPASE, AMYLASE in the last 168 hours. No results for input(s): AMMONIA in the last 168 hours. CBC:  Recent Labs Lab 08/13/14 2320 08/15/14 0540 08/16/14 0548 08/17/14 0500 08/18/14 0700  WBC 5.4 8.6 15.7* 19.5* 15.1*  NEUTROABS 3.1  --   --  16.1* PENDING  HGB 12.9* 12.1* 13.6 12.6* 11.3*  HCT 36.5* 33.3* 38.1* 35.1* 32.9*  MCV 84.5 82.0 83.6 84.4 85.7  PLT 133* 129* 108* 109* 137*   Cardiac Enzymes: No results for input(s): CKTOTAL, CKMB, CKMBINDEX, TROPONINI in the last 168 hours. BNP (last 3 results)  Recent Labs  08/10/14 2013  BNP 22.5    ProBNP (last 3 results) No results for input(s): PROBNP in the last 8760 hours.  CBG:  Recent Labs Lab 08/13/14 1156 08/13/14 1648 08/13/14 2105 08/14/14 0749 08/14/14 1211  GLUCAP 76 137* 127* 115* 132*    Recent Results (from the past 240 hour(s))  Culture, blood (routine x 2)     Status: None (Preliminary result)   Collection Time: 08/13/14 11:20 PM  Result Value Ref Range Status   Specimen Description BLOOD LEFT ARM  Final   Special Requests   Final    BOTTLES DRAWN AEROBIC ONLY 10CCS BLUE NO ANTIBIOTICS   Culture   Final           BLOOD CULTURE RECEIVED NO GROWTH TO DATE CULTURE WILL BE HELD FOR 5 DAYS BEFORE ISSUING A FINAL NEGATIVE REPORT Performed at [facility]    Report Status PENDING  Incomplete  Culture, blood (routine  x 2)     Status: None (Preliminary result)   Collection Time: 08/13/14 11:30 PM  Result Value Ref Range Status   Specimen Description BLOOD LEFT HAND  Final   Special Requests BOTTLES DRAWN AEROBIC ONLY 5CCS BLUE  Final   Culture   Final           BLOOD CULTURE RECEIVED NO GROWTH TO DATE CULTURE WILL BE HELD FOR 5 DAYS BEFORE ISSUING A FINAL NEGATIVE REPORT Performed at [facility]    Report Status PENDING  Incomplete  Culture, Urine     Status: None   Collection Time: 08/14/14 12:11 AM  Result Value Ref Range Status   Specimen Description URINE, CATHETERIZED  Final   Special Requests Normal  Final   Colony Count NO GROWTH Performed at [facility]   Final   Culture NO GROWTH Performed at [facility]   Final   Report Status 08/15/2014 FINAL  Final  Clostridium Difficile by PCR     Status: None   Collection Time: 08/17/14 11:51 AM  Result Value Ref Range Status   C difficile by pcr NEGATIVE NEGATIVE Final     Studies: No results found.  Scheduled Meds: . fluticasone  1 spray Each Nare Daily  . hydrocortisone  40 mg Oral BID  . levothyroxine  25 mcg Oral QAC breakfast  . loperamide  2 mg Oral BID  . nystatin cream   Topical BID  . pantoprazole  40 mg Oral BID AC  . simethicone  80 mg Oral TID AC & HS   Continuous Infusions: . sodium chloride     Antibiotics Given (last 72 hours)    None      Principal Problem:   Adrenal hemorrhage Active Problems:   Hypertension, essential, benign   Adrenal insufficiency   Hyponatremia   Hypokalemia   Fever   Constipation   Purpura   Bleeding hemorrhoids   Hypothyroidism  Time spent: 35 min  [facility] Pager [phone] [email or web address], password [facility] 08/18/2014, 10:47 AM  LOS: 7 days

## 2014-08-18 NOTE — Progress Notes (Signed)
Occupational Therapy Treatment Patient Details Name: Kenneth Cline MRN: [id number] DOB: 04-18-1956 Today's Date: 08/18/2014    History of present illness 59 y.o. male admitted with adrenal hemorrhage. Pt with recent rotator cuff surgery.   OT comments  Pt progressing.   Follow Up Recommendations  Home health OT;Supervision - Intermittent    Equipment Recommendations  Tub/shower seat    Recommendations for Other Services      Precautions / Restrictions Precautions Precautions: Shoulder Type of Shoulder Precautions: AAROM as tolerated, ADLs and gentle AROM ok Shoulder Interventions: Shoulder sling/immobilizer (sling in public per pt) Precaution Booklet Issued: Yes (comment) (given yesterday) Precaution Comments: reviewed NWB RUE Required Braces or Orthoses: Sling Restrictions Weight Bearing Restrictions: Yes RUE Weight Bearing: Non weight bearing       Mobility Bed Mobility               General bed mobility comments: not assessed-pt up in chair  Transfers Overall transfer level: Modified independent               General transfer comment: reinforced pt not to put weight on RUE which he stated he did not.        ADL Overall ADL's : Needs assistance/impaired     Grooming: Set up;Supervision/safety;Standing;Oral care Grooming Details (indicate cue type and reason): encouraged pt to use RUE                 Toilet Transfer: Supervision/safety;Ambulation (chair)           Functional mobility during ADLs: Supervision/safety General ADL Comments: Encouraged pt to use RUE at sink during oral care and explained he can use for ADLs.      Vision                     Perception     Praxis      Cognition  Awake/Alert Behavior During Therapy: WFL for tasks assessed/performed Overall Cognitive Status: Within Functional Limits for tasks assessed                       Extremity/Trunk Assessment                Exercises Other Exercises Other Exercises: Pt performed approximately 10 reps each of shoulder flexion (AAROM), external rotation (AAROM), and abduction (AAROM)-gave pt handout of these exercises. Briefly talked about pendulums. Also, explained that exercises performed in session may be easier done in supine.  Positioning of UE while sleeping: Patient able to independently direct caregiver   Shoulder Instructions Shoulder Instructions Positioning of UE while sleeping: Patient able to independently direct caregiver     General Comments      Pertinent Vitals/ Pain       Pain Assessment: 0-10 Pain Score: 4  (in stomach) Pain Location: stomach and RUE Pain Descriptors / Indicators: Cramping (in stomach) Pain Intervention(s): Monitored during session  Home Living                                          Prior Functioning/Environment              Frequency Min 2X/week     Progress Toward Goals  OT Goals(current goals can now be found in the care plan section)  Progress towards OT goals: Progressing toward goals  Acute Rehab OT Goals Patient Stated Goal: not stated  OT Goal Formulation: With patient/family Time For Goal Achievement: 08/24/14 Potential to Achieve Goals: Good ADL Goals Pt Will Perform Grooming: with set-up;standing (incorporating RUE) Pt Will Perform Upper Body Dressing: with set-up;sitting Pt Will Perform Lower Body Dressing: sit to/from stand;with min assist Additional ADL Goal #1: Pt will independently perform HEP for RUE.   Plan Discharge plan needs to be updated    Co-evaluation                 End of Session Equipment Utilized During Treatment: Gait belt   Activity Tolerance Patient tolerated treatment well   Patient Left Other (comment) (up with wife; had diarrhea)   Nurse Communication          Time: 8088-1103 OT Time Calculation (min): 16 min  Charges: OT General Charges $OT Visit: 1 Procedure OT  Treatments $Therapeutic Exercise: 8-22 mins  Benito Mccreedy OTR/L [phone] 08/18/2014, 10:43 AM

## 2014-08-19 DIAGNOSIS — K5909 Other constipation: Secondary | ICD-10-CM

## 2014-08-19 LAB — CBC
HCT: 31 % — ABNORMAL LOW (ref 39.0–52.0)
Hemoglobin: 10.6 g/dL — ABNORMAL LOW (ref 13.0–17.0)
MCH: 29.4 pg (ref 26.0–34.0)
MCHC: 34.2 g/dL (ref 30.0–36.0)
MCV: 86.1 fL (ref 78.0–100.0)
PLATELETS: 150 10*3/uL (ref 150–400)
RBC: 3.6 MIL/uL — ABNORMAL LOW (ref 4.22–5.81)
RDW: 13.2 % (ref 11.5–15.5)
WBC: 12.6 10*3/uL — ABNORMAL HIGH (ref 4.0–10.5)

## 2014-08-19 LAB — BASIC METABOLIC PANEL
Anion gap: 11 (ref 5–15)
BUN: 13 mg/dL (ref 6–23)
CO2: 16 mmol/L — ABNORMAL LOW (ref 19–32)
Calcium: 7.7 mg/dL — ABNORMAL LOW (ref 8.4–10.5)
Chloride: 102 mmol/L (ref 96–112)
Creatinine, Ser: 1.02 mg/dL (ref 0.50–1.35)
GFR, EST NON AFRICAN AMERICAN: 79 mL/min — AB (ref >90)
Glucose, Bld: 147 mg/dL — ABNORMAL HIGH (ref 70–99)
POTASSIUM: 4.2 mmol/L (ref 3.5–5.1)
SODIUM: 129 mmol/L — AB (ref 135–145)

## 2014-08-19 LAB — ANTINUCLEAR ANTIBODIES, IFA: ANTINUCLEAR ANTIBODIES, IFA: NEGATIVE

## 2014-08-19 MED ORDER — RISAQUAD PO CAPS
1.0000 | ORAL_CAPSULE | Freq: Every day | ORAL | Status: DC
  Administered 2014-08-19 – 2014-08-20 (×2): 1 via ORAL
  Filled 2014-08-19 (×2): qty 1

## 2014-08-19 NOTE — Progress Notes (Signed)
PT Cancellation Note  Patient Details Name: Kenneth Cline MRN: [id number] DOB: 13-Nov-1955   Cancelled Treatment:    Reason Eval/Treat Not Completed: Patient declined, no reason specified Patient politely declines to work with therapy this afternoon. States he does not feel he needs to practice climbing stairs at this time. Of note, he has been having bowel urgency. Will continue to follow.  Ellouise Newer 08/19/2014, 4:58 PM Camille Bal [place], [place]

## 2014-08-19 NOTE — Progress Notes (Addendum)
PROGRESS NOTE  Kenneth Cline PXT:[id number] DOB: July 25, 1955 DOA: 08/10/2014 PCP: Delia Chimes, NP  Assessment/Plan: Adrenal hemmorrhage with adrenal insufficiency:  related to surgery? -repeat CT scan shows b/l adrenal hemm ACTH stim test positive- changed to po cortef, renin/aldos ok.  Will need outpatient follow-up with endocrinology.  Plasma Normetanephrine slightly elevated with normal metanephrines. Not sure how to interpret this  Fever, single episode Resolved.   Constipation from narcotic use Resolved.   Still with diarrhea  Likely just related to laxatives which have been stopped and retained stool, but will check GI pathogen panel. C. difficile PCR negative. Imodium cautiously.   double Cortef to 40 mg twice a day.  Gastroesophageal reflux disease: Complaining of worsening bloating, poor appetite, heartburn. Will change Protonix to twice a day. Add Tums when necessary per wife's request, and schedule simethicone.  Bleeding hemorrhoids: bleeding has stopped. Continue anusol suppositories  Purpuric rash: Does not look like Stevens-Johnson's which is what the nurses were concerned about.  vasculitis workup underway. ESR 83, CRP 15, hepatitis panel negative, HIV nonreactive. cryoglobulins, ANA, Anca negative. Will need follow-up with dermatology as an outpatient for likely biopsy.  Have personally arranged with Dr. Edmonia Lynch for Monday and placed on a AVS. Blood cultures to date remain negative. Transthoracic echocardiogram showed no vegetations.  Hypothyroidism:  TSH above 7. Free T3 low.  Will start Synthroid.  HTN- controlled  Hyponatremia Likely from adrenal insufficiency,  hypothyroidism. Continue cortef and monitor. Started Synthroid.  Hypokalemia: Resolved.   Elevated blood sugars -resolved  Thrombocytopenia -?etiology  Also has mildly elevated PTT: lupus anticoagulant panel elevated, will need repeat  Recent shoulder surgery:  Consulted OT.  Dr. Veverly Fells made a  follow-up visit -home health  Code Status: full Family Communication: wife at bedside Disposition Plan:  Home once diarrhea resolves/lessens   Consultants:  Urology  Orthopedics  Procedures:     HPI/Subjective: Stool has improved but still going frequently  Objective: Filed Vitals:   08/19/14 0503  BP: 128/76  Pulse:   Temp: 98.4 F (36.9 C)  Resp: 20    Intake/Output Summary (Last 24 hours) at 08/19/14 1126 Last data filed at 08/18/14 1620  Gross per 24 hour  Intake 341.67 ml  Output      0 ml  Net 341.67 ml   Filed Weights   08/10/14 2005  Weight: 91.627 kg (202 lb)    Exam:   General:  Awake  Cardiovascular: rrr without murmurs gallops rubs  Respiratory: clear to auscultation without wheezes rhonchi or rales  Abdomen: Diminished BS, soft, nontender nondistended Musculoskeletal: no edema.     Data Reviewed: Basic Metabolic Panel:  Recent Labs Lab 08/15/14 0540 08/16/14 0548 08/17/14 0500 08/18/14 0700 08/19/14 0705  NA 131* 128* 128* 129* 129*  K 3.5 3.4* 4.5 4.0 4.2  CL 98 98 101 99 102  CO2 20 18* 16* 16* 16*  GLUCOSE 149* 182* 154* 120* 147*  BUN _0 CREATININE 0.71 0.84 1.02 1.08 1.02  CALCIUM 8.4 7.9* 7.9* 8.0* 7.7*  MG  --  1.9  --   --   --    Liver Function Tests:  Recent Labs Lab 08/13/14 0908 08/13/14 2320  AST 21 32  ALT 20 25  ALKPHOS 74 94  BILITOT 4.2* 2.1*  PROT 5.7* 6.0  ALBUMIN 2.3* 2.5*   No results for input(s): LIPASE, AMYLASE in the last 168 hours. No results for input(s): AMMONIA in the last 168 hours. CBC:  Recent Labs  Lab 08/13/14 2320 08/15/14 0540 08/16/14 0548 08/17/14 0500 08/18/14 0700 08/19/14 0705  WBC 5.4 8.6 15.7* 19.5* 15.1* 12.6*  NEUTROABS 3.1  --   --  16.1* 12.2*  --   HGB 12.9* 12.1* 13.6 12.6* 11.3* 10.6*  HCT 36.5* 33.3* 38.1* 35.1* 32.9* 31.0*  MCV 84.5 82.0 83.6 84.4 85.7 86.1  PLT 133* 129* 108* 109* 137* 150   Cardiac Enzymes: No results for  input(s): CKTOTAL, CKMB, CKMBINDEX, TROPONINI in the last 168 hours. BNP (last 3 results)  Recent Labs  08/10/14 2013  BNP 22.5    ProBNP (last 3 results) No results for input(s): PROBNP in the last 8760 hours.  CBG:  Recent Labs Lab 08/13/14 1156 08/13/14 1648 08/13/14 2105 08/14/14 0749 08/14/14 1211  GLUCAP 76 137* 127* 115* 132*    Recent Results (from the past 240 hour(s))  Culture, blood (routine x 2)     Status: None (Preliminary result)   Collection Time: 08/13/14 11:20 PM  Result Value Ref Range Status   Specimen Description BLOOD LEFT ARM  Final   Special Requests   Final    BOTTLES DRAWN AEROBIC ONLY 10CCS BLUE NO ANTIBIOTICS   Culture   Final           BLOOD CULTURE RECEIVED NO GROWTH TO DATE CULTURE WILL BE HELD FOR 5 DAYS BEFORE ISSUING A FINAL NEGATIVE REPORT Performed at [facility]    Report Status PENDING  Incomplete  Culture, blood (routine x 2)     Status: None (Preliminary result)   Collection Time: 08/13/14 11:30 PM  Result Value Ref Range Status   Specimen Description BLOOD LEFT HAND  Final   Special Requests BOTTLES DRAWN AEROBIC ONLY 5CCS BLUE  Final   Culture   Final           BLOOD CULTURE RECEIVED NO GROWTH TO DATE CULTURE WILL BE HELD FOR 5 DAYS BEFORE ISSUING A FINAL NEGATIVE REPORT Performed at [facility]    Report Status PENDING  Incomplete  Culture, Urine     Status: None   Collection Time: 08/14/14 12:11 AM  Result Value Ref Range Status   Specimen Description URINE, CATHETERIZED  Final   Special Requests Normal  Final   Colony Count NO GROWTH Performed at [facility]   Final   Culture NO GROWTH Performed at [facility]   Final   Report Status 08/15/2014 FINAL  Final  Clostridium Difficile by PCR     Status: None   Collection Time: 08/17/14 11:51 AM  Result Value Ref Range Status   C difficile by pcr NEGATIVE NEGATIVE Final     Studies: No results found.  Scheduled Meds: .  acidophilus  1 capsule Oral Daily  . fluticasone  1 spray Each Nare Daily  . hydrocortisone  40 mg Oral BID  . levothyroxine  25 mcg Oral QAC breakfast  . loperamide  2 mg Oral BID  . nystatin cream   Topical BID  . pantoprazole  40 mg Oral BID AC  . simethicone  80 mg Oral TID AC & HS   Continuous Infusions:   Antibiotics Given (last 72 hours)    None      Principal Problem:   Adrenal hemorrhage Active Problems:   Hypertension, essential, benign   Adrenal insufficiency   Hyponatremia   Hypokalemia   Fever   Constipation   Purpura   Bleeding hemorrhoids   Hypothyroidism   Diarrhea  Time spent: 25  min  Eulogio Bear  [facility] Pager [phone] [email or web address], password [facility] 08/19/2014, 11:26 AM  LOS: 8 days

## 2014-08-19 NOTE — Progress Notes (Signed)
Spoke with Janeann Merl CM worker's comp. Notified of [place] and DME orders. Ms. Sharlene Motts to arrange this, and update patient.

## 2014-08-19 NOTE — Progress Notes (Signed)
Order and DC Summ faxed to [place] for [facility] DME PT OT.

## 2014-08-20 LAB — GI PATHOGEN PANEL BY PCR, STOOL
C difficile toxin A/B: NOT DETECTED
CAMPYLOBACTER BY PCR: NOT DETECTED
CRYPTOSPORIDIUM BY PCR: NOT DETECTED
E COLI 0157 BY PCR: NOT DETECTED
E coli (ETEC) LT/ST: NOT DETECTED
E coli (STEC): NOT DETECTED
G lamblia by PCR: NOT DETECTED
NOROVIRUS G1/G2: NOT DETECTED
Rotavirus A by PCR: NOT DETECTED
Salmonella by PCR: NOT DETECTED
Shigella by PCR: NOT DETECTED

## 2014-08-20 LAB — CULTURE, BLOOD (ROUTINE X 2)
Culture: NO GROWTH
Culture: NO GROWTH

## 2014-08-20 LAB — CBC
HCT: 29.9 % — ABNORMAL LOW (ref 39.0–52.0)
HEMOGLOBIN: 10.3 g/dL — AB (ref 13.0–17.0)
MCH: 29.3 pg (ref 26.0–34.0)
MCHC: 34.4 g/dL (ref 30.0–36.0)
MCV: 84.9 fL (ref 78.0–100.0)
Platelets: 182 10*3/uL (ref 150–400)
RBC: 3.52 MIL/uL — AB (ref 4.22–5.81)
RDW: 13.1 % (ref 11.5–15.5)
WBC: 12.4 10*3/uL — ABNORMAL HIGH (ref 4.0–10.5)

## 2014-08-20 LAB — BASIC METABOLIC PANEL
Anion gap: 9 (ref 5–15)
BUN: 12 mg/dL (ref 6–23)
CO2: 21 mmol/L (ref 19–32)
Calcium: 7.8 mg/dL — ABNORMAL LOW (ref 8.4–10.5)
Chloride: 102 mmol/L (ref 96–112)
Creatinine, Ser: 1.09 mg/dL (ref 0.50–1.35)
GFR, EST AFRICAN AMERICAN: 84 mL/min — AB (ref >90)
GFR, EST NON AFRICAN AMERICAN: 72 mL/min — AB (ref >90)
Glucose, Bld: 168 mg/dL — ABNORMAL HIGH (ref 70–99)
POTASSIUM: 3.5 mmol/L (ref 3.5–5.1)
SODIUM: 132 mmol/L — AB (ref 135–145)

## 2014-08-20 MED ORDER — HYDROCORTISONE 10 MG PO TABS: ORAL_TABLET | ORAL | Status: DC

## 2014-08-20 MED ORDER — TRAMADOL HCL 50 MG PO TABS: 50.0000 mg | ORAL_TABLET | Freq: Two times a day (BID) | ORAL | Status: DC | PRN

## 2014-08-20 MED ORDER — HYDROCORTISONE ACETATE 25 MG RE SUPP: 25.0000 mg | Freq: Two times a day (BID) | RECTAL | Status: DC | PRN

## 2014-08-20 MED ORDER — LEVOTHYROXINE SODIUM 25 MCG PO TABS: 25.0000 ug | ORAL_TABLET | Freq: Every day | ORAL | Status: DC

## 2014-08-20 NOTE — Discharge Summary (Signed)
Physician Discharge Summary  Treyvonne Tata RXV:[id number] DOB: 1956/01/16 DOA: 08/10/2014  PCP: Delia Chimes, NP  Admit date: 08/10/2014 Discharge date: 08/20/2014  Time spent: 35 minutes  Recommendations for Outpatient Follow-up:  1. Cortef taper to 30 mg q AM and 20 mg q PM 2. Endo follow up 3. Home health  Discharge Diagnoses:  Principal Problem:   Adrenal hemorrhage Active Problems:   Hypertension, essential, benign   Adrenal insufficiency   Hyponatremia   Hypokalemia   Fever   Constipation   Purpura   Bleeding hemorrhoids   Hypothyroidism   Diarrhea   Discharge Condition: improved  Diet recommendation: regular  Filed Weights   08/10/14 2005  Weight: 91.627 kg (202 lb)    History of present illness:  59 y/o ?Htn, prior Stven's johnson, GERD, prior history of cyclic neutropenia followed by Dr. Humphrey Rolls [2011] BPH, Stress ? Treadmill stress test, Htn recent surgery on right shoulder 08/03/14-injured his shoulder 03/30/2014 was doing fairly until 2/7 ago. He started to feel horrible with intermittent abdominal pain, chills, no subjective fever, he has not passed a bowel movement since 4/10 He has had worsening pain over the past 24-48 hours with nausea but no vomiting. He denies any specific chest pain-like features concerning for ACS. He he has no arm radiation he has no neck radiation. At baseline he is a functional gentleman who until his fall and injury on his right shoulder was working with [employer] He has never had any abdominal pain like this before and this was 10/10 pain. It was constant and it worsened over the course of the past 12 hours to the point that he came to [facility] ED 4/19  No rash, no ill contacts, no prior abdominal surgery, no blurred vision, no double vision, no unilateral weakness, no fall or trauma,  Patient had a workup in the emergency room consisting of Be met showing sodium 126, chloride 87 right now 3 colostomy in [place], random glucose 226  without a history of diabetes mellitus, Indirect bili 1.3, total bili 1.6, albumin 3.4, point-of-care troponin 0.04 APTT 54   CT abdomen pelvis = enlargement left adrenal gland 2.6 3. 2X3.0 with adjacent inflammatory stranding Underlying mass lesion that has bled is not excluded-recommended adrenal mass CT protocol Inflammatory soft tissue stranding around bilateral internal iliac arteries in the lower pelvis finding of uncertain etiology-may reflect blood-? Vasculitis Intraluminal fluid density at long colonic lumen suggestive of possible diarrhea   Hospital Course:  Adrenal hemmorrhage with adrenal insufficiency: related to surgery on shoulder? -repeat CT scan shows b/l adrenal hemm ACTH stim test positive- changed to po cortef- wean down down to 30 mg q am and 20 q pm, renin/aldos ok.  Will need outpatient follow-up with endocrinology.  Plasma Normetanephrine slightly elevated with normal metanephrines. Not sure how to interpret this  Fever, single episode Resolved.   Constipation from narcotic use Resolved.   Still with diarrhea  Likely just related to laxatives which have been stopped and retained stool, but will check GI pathogen panel. C. difficile PCR negative. Imodium cautiously.  double Cortef to 40 mg twice a day- wean down  Gastroesophageal reflux disease: Complaining of worsening bloating, poor appetite, heartburn. Will change Protonix to twice a day. Add Tums when necessary per wife's request, and schedule simethicone.  Bleeding hemorrhoids: bleeding has stopped. Continue anusol suppositories  Purpuric rash: Does not look like Stevens-Johnson's which is what the nurses were concerned about. vasculitis workup underway. ESR 83, CRP 15, hepatitis panel  negative, HIV nonreactive. cryoglobulins, ANA, Anca negative. Will need follow-up with dermatology as an outpatient for likely biopsy.   Blood cultures to date remain negative. Transthoracic echocardiogram showed no  vegetations.  Hypothyroidism: TSH above 7. Free T3 low. Will start Synthroid.  HTN- controlled  Hyponatremia Likely from adrenal insufficiency, hypothyroidism. Continue cortef and monitor. Started Synthroid.  Hypokalemia: Resolved.   Elevated blood sugars -resolved  Thrombocytopenia -?etiology  Also has mildly elevated PTT: lupus anticoagulant panel elevated, will need repeat outpatient  Recent shoulder surgery: Consulted OT. Dr. Veverly Fells made a follow-up visit -home health  Procedures:    Consultations:  urology  Discharge Exam: Filed Vitals:   08/20/14 1344  BP: 159/70  Pulse: 97  Temp:   Resp: 18    General: A+Ox3, NAD Cardiovascular: rrr Respiratory: clear  Discharge Instructions   Discharge Instructions    Diet - low sodium heart healthy    Complete by:  As directed      Increase activity slowly    Complete by:  As directed           Current Discharge Medication List    START taking these medications   Details  hydrocortisone (ANUSOL-HC) 25 MG suppository Place 1 suppository (25 mg total) rectally 2 (two) times daily as needed for hemorrhoids or itching. Qty: 12 suppository, Refills: 0    hydrocortisone (CORTEF) 10 MG tablet 40 mg BID (once in AM and once in PM) for 2 weeks then decrease to 30 mg BID (once in AM and once in PM) for 2 weeks then decrease to 30 mg in AM and 20 mg in PM Qty: 250 tablet, Refills: 0    levothyroxine (SYNTHROID, LEVOTHROID) 25 MCG tablet Take 1 tablet (25 mcg total) by mouth daily before breakfast. Qty: 30 tablet, Refills: 0    traMADol (ULTRAM) 50 MG tablet Take 1 tablet (50 mg total) by mouth every 12 (twelve) hours as needed for moderate pain. Qty: 30 tablet, Refills: 0      CONTINUE these medications which have NOT CHANGED   Details  cetirizine (ZYRTEC) 10 MG tablet Take 10 mg by mouth daily.    ezetimibe (ZETIA) 10 MG tablet Take 1 tablet (10 mg total) by mouth daily. Qty: 90 tablet, Refills: 3     loperamide (IMODIUM A-D) 2 MG tablet 2 now and one after each loose stool q1h prn max 8 in 24 hours Qty: 30 tablet, Refills: 0    NASONEX 50 MCG/ACT nasal spray Place 2 sprays into the nose daily as needed (allergies).     pantoprazole (PROTONIX) 40 MG tablet Take 1 tablet (40 mg total) by mouth daily. Qty: 90 tablet, Refills: 3    aspirin EC 81 MG tablet Take 81 mg by mouth daily.    fish oil-omega-3 fatty acids 1000 MG capsule Take 2 g by mouth daily.       STOP taking these medications     HYDROcodone-acetaminophen (NORCO) 7.5-325 MG per tablet      HYDROcodone-acetaminophen (NORCO/VICODIN) 5-325 MG per tablet      lisinopril-hydrochlorothiazide (PRINZIDE,ZESTORETIC) 20-12.5 MG per tablet      oxyCODONE (ROXICODONE) 15 MG immediate release tablet      sildenafil (VIAGRA) 100 MG tablet      chlorpheniramine-HYDROcodone (TUSSIONEX PENNKINETIC ER) 10-8 MG/5ML LQCR      tadalafil (CIALIS) 20 MG tablet        Allergies  Allergen Reactions  . Penicillins     REACTION: rash  . Biaxin [Clarithromycin] Rash  .  Keflex [Cephalexin] Rash   Follow-up Information    Follow up with Regino Schultze, MD On 08/17/2014.   Specialty:  Dermatology   Why:  at 3 o'clock for rash   Contact information:   [place] [address] [phone]       Follow up with Delrae Rend, MD.   Specialty:  Endocrinology   Why:  for adrenal insufficiency and hypothyroidism- family scheduled appointment   Contact information:   [place] [place] [address] [phone]       Follow up with Delia Chimes, NP On 08/25/2014.   Specialty:  Nurse Practitioner   Why:  APPT scheduled for Tuesday, May 03th, 2016 @ 11:30am.   Contact information:   [place] [address] [phone]       Follow up with Augustin Schooling, MD. Schedule an appointment as soon as possible for a visit in 1 week.   Specialty:  Orthopedic Surgery   Why:  [phone]    Contact information:   [address] [place] [address] [phone]       Follow up with [facility], NP In 1 week.   Specialty:  Nurse Practitioner   Contact information:   [place] [address] [phone]        The results of significant diagnostics from this hospitalization (including imaging, microbiology, ancillary and laboratory) are listed below for reference.    Significant Diagnostic Studies: Dg Chest 2 View  08/10/2014   CLINICAL DATA:  Left chest pain. Shortness of breath. Symptoms began today. Recent rotator cuff surgery.  EXAM: CHEST  2 VIEW  COMPARISON:  05/08/2013  FINDINGS: Heart size is normal. No mediastinal abnormality. There is volume loss in both lower lobes consistent with atelectasis and possibly pneumonia. No dense consolidation or lobar collapse. No effusions. No acute bony finding.  IMPRESSION: Atelectasis or infiltrate at both lung bases.   Electronically Signed   By: Nelson Chimes M.D.   On: 08/10/2014 21:45   Ct Abdomen Pelvis W Contrast  08/11/2014   CLINICAL DATA:  Initial evaluation for lower back pain radiating to abdomen. Also with nausea, constipation.  EXAM: CT ABDOMEN AND PELVIS WITH CONTRAST  TECHNIQUE: Multidetector CT imaging of the abdomen and pelvis was performed using the standard protocol following bolus administration of intravenous contrast.  CONTRAST:  38m OMNIPAQUE IOHEXOL 300 MG/ML SOLN, 1043mOMNIPAQUE IOHEXOL 300 MG/ML SOLN  COMPARISON:  None.  FINDINGS: Bibasilar linear opacities within the visualized lung bases are most compatible with atelectasis. Visualized lungs are otherwise clear. No pleural or pericardial fusion.  Subcentimeter hypodensity within the right hepatic lobe noted, too small the characterize, but may reflect a small cyst. Liver is otherwise unremarkable. Few hyperdense stones present within the gallbladder lumen. No evidence for acute cholecystitis. No biliary dilatation.  Spleen, right adrenal gland, and pancreas demonstrate a normal contrast enhanced appearance.  There is globular soft tissue density involving the left adrenal gland measuring approximately 2.6 x 3.2 x 3.0 cm. There is adjacent inflammatory stranding.  Kidneys are equal in size with symmetric enhancement. Subcentimeter hypodensity noted within the interpolar right kidney, too small the characterize, but statistically likely a small cyst. No nephrolithiasis, hydronephrosis, or focal enhancing renal mass.  Stomach within normal limits. No evidence for bowel obstruction. Appendix is normal. Scattered fluid density present throughout most of the colon, suggesting possible diarrheal illness. No abnormal wall thickening, mucosal enhancement, or inflammatory fat stranding seen about  the bowels.  Bladder within normal limits.  Prostate unremarkable.  No free air or fluid. No pathologically enlarged intra-abdominal pelvic lymph nodes identified. Normal intravascular enhancement seen throughout the intra-abdominal aorta and its branch vessels. Mild stranding present about the bilateral iliac vessels, of uncertain significance.  No acute osseous abnormality. No worrisome lytic or blastic osseous lesions.  IMPRESSION: 1. Enlargement of the left adrenal gland, measuring 2.6 x 3.2 x 3.0 cm with adjacent inflammatory stranding. While the unilateral nature of this finding is somewhat unusual, this is favored to reflect an acute adrenal hemorrhage. This may be related to recent surgery/stress or possibly coagulopathy. An underlying mass lesion that has bled is not excluded. A short interval follow-up study in 1 month is recommended to ensure resolution of these findings. This could be performed with either an adrenal mass protocol CT or MRI. 2. Inflammatory soft tissue stranding about the bilateral internal iliac arteries in the lower pelvis. Finding is of uncertain etiology, and may reflect small amount of blood products tracking  inferiorly from the hemorrhage in the left adrenal gland. Alternatively, possible vasculitis could also be considered. Attention at follow-up recommended. 3. Intraluminal fluid density within the colonic lumen, suggesting possible diarrheal illness.   Electronically Signed   By: Jeannine Boga M.D.   On: 08/11/2014 06:03   Ct Abd Wo & W Cm  08/12/2014   CLINICAL DATA:  Left adrenal mass, evaluate for continued hemorrhage  EXAM: CT ABDOMEN WITHOUT AND WITH CONTRAST  TECHNIQUE: Multidetector CT imaging of the abdomen was performed following the standard protocol before and following the bolus administration of intravenous contrast.  CONTRAST:  174m OMNIPAQUE IOHEXOL 300 MG/ML  SOLN  COMPARISON:  08/11/2014  FINDINGS: Lower chest: Bilateral lower lobe atelectasis with trace bilateral pleural effusions.  Hepatobiliary: 6 mm cyst in the right hepatic lobe.  Layering gallstones with vicarious excretion of contrast. No inflammatory changes. No intrahepatic or extrahepatic ductal dilatation.  Pancreas: Within normal limits.  Spleen: Within normal limits.  Adrenals/Urinary Tract: 3.4 x 2.3 cm right adrenal hemorrhage, new. 3.4 x 2.5 cm left adrenal hemorrhage, increased, previously 3.2 x 2.6 cm. Surrounding retroperitoneal stranding/ inflammatory changes.  No evidence of underlying adrenal mass.  9 mm lateral interpolar right renal cyst. No enhancing renal lesions.  No renal calculi or hydronephrosis.  Stomach/Bowel: Stomach is notable for an indwelling enteric tube terminating in the posterior gastric cardia.  Visualized bowel is unremarkable.  Vascular/Lymphatic: No evidence abdominal aortic aneurysm.  No suspicious abdominal lymphadenopathy.  Other: No abdominal ascites.  Musculoskeletal: No focal osseous lesions.  IMPRESSION: 3.4 x 2.3 cm right adrenal hemorrhage, new.  3.4 x 2.5 cm left adrenal hemorrhage, increased.  No evidence of underlying adrenal mass.   Electronically Signed   By: SJulian HyM.D.    On: 08/12/2014 11:07   Dg Chest Port 1 View  08/13/2014   CLINICAL DATA:  Fever, history of hypertension.  EXAM: PORTABLE CHEST - 1 VIEW  COMPARISON:  08/10/2014  FINDINGS: Low lung volumes with bibasilar atelectasis, similar to prior study. Heart is normal size. No effusions. No acute bony abnormality. Probable resection of the distal right clavicle.  IMPRESSION: Low lung volumes, bibasilar atelectasis.   Electronically Signed   By: KRolm BaptiseM.D.   On: 08/13/2014 22:56    Microbiology: Recent Results (from the past 240 hour(s))  Culture, blood (routine x 2)     Status: None   Collection Time: 08/13/14 11:20 PM  Result Value Ref Range Status  Specimen Description BLOOD LEFT ARM  Final   Special Requests   Final    BOTTLES DRAWN AEROBIC ONLY 10CCS BLUE NO ANTIBIOTICS   Culture   Final    NO GROWTH 5 DAYS Performed at [facility]    Report Status 08/20/2014 FINAL  Final  Culture, blood (routine x 2)     Status: None   Collection Time: 08/13/14 11:30 PM  Result Value Ref Range Status   Specimen Description BLOOD LEFT HAND  Final   Special Requests BOTTLES DRAWN AEROBIC ONLY 5CCS BLUE  Final   Culture   Final    NO GROWTH 5 DAYS Performed at [facility]    Report Status 08/20/2014 FINAL  Final  Culture, Urine     Status: None   Collection Time: 08/14/14 12:11 AM  Result Value Ref Range Status   Specimen Description URINE, CATHETERIZED  Final   Special Requests Normal  Final   Colony Count NO GROWTH Performed at [facility]   Final   Culture NO GROWTH Performed at [facility]   Final   Report Status 08/15/2014 FINAL  Final  Clostridium Difficile by PCR     Status: None   Collection Time: 08/17/14 11:51 AM  Result Value Ref Range Status   C difficile by pcr NEGATIVE NEGATIVE Final     Labs: Basic Metabolic Panel:  Recent Labs Lab 08/16/14 0548 08/17/14 0500 08/18/14 0700 08/19/14 0705 08/20/14 0550  NA 128* 128* 129* 129*  132*  K 3.4* 4.5 4.0 4.2 3.5  CL 98 101 99 102 102  CO2 18* 16* 16* 16* 21  GLUCOSE 182* 154* 120* 147* 168*  BUN 12 15 16 13 12   CREATININE 0.84 1.02 1.08 1.02 1.09  CALCIUM 7.9* 7.9* 8.0* 7.7* 7.8*  MG 1.9  --   --   --   --    Liver Function Tests:  Recent Labs Lab 08/13/14 2320  AST 32  ALT 25  ALKPHOS 94  BILITOT 2.1*  PROT 6.0  ALBUMIN 2.5*   No results for input(s): LIPASE, AMYLASE in the last 168 hours. No results for input(s): AMMONIA in the last 168 hours. CBC:  Recent Labs Lab 08/13/14 2320  08/16/14 0548 08/17/14 0500 08/18/14 0700 08/19/14 0705 08/20/14 0550  WBC 5.4  < > 15.7* 19.5* 15.1* 12.6* 12.4*  NEUTROABS 3.1  --   --  16.1* 12.2*  --   --   HGB 12.9*  < > 13.6 12.6* 11.3* 10.6* 10.3*  HCT 36.5*  < > 38.1* 35.1* 32.9* 31.0* 29.9*  MCV 84.5  < > 83.6 84.4 85.7 86.1 84.9  PLT 133*  < > 108* 109* 137* 150 182  < > = values in this interval not displayed. Cardiac Enzymes: No results for input(s): CKTOTAL, CKMB, CKMBINDEX, TROPONINI in the last 168 hours. BNP: BNP (last 3 results)  Recent Labs  08/10/14 2013  BNP 22.5    ProBNP (last 3 results) No results for input(s): PROBNP in the last 8760 hours.  CBG:  Recent Labs Lab 08/13/14 1648 08/13/14 2105 08/14/14 0749 08/14/14 1211  GLUCAP 137* 127* 115* 132*       Signed:  Matalynn Graff  [facility] 08/20/2014, 2:19 PM

## 2014-08-20 NOTE — Progress Notes (Signed)
Spoke with dianna frye cm worker's comp. Release of information signed, Levander Campion to come to hospital to get records needed for review today. Carles Collet RN BSN CM

## 2014-08-20 NOTE — Progress Notes (Signed)
NURSING PROGRESS NOTE  Kenneth Cline [id number] Discharge Data: 08/20/2014 2:29 PM Attending Provider: Geradine Girt, DO WUX:LKGMWN,UUVOZ, NP   [name] to be D/C'd Home per MD order via volunteer services in wheelchair with hard copies of all prescriptions and d/c paperwork.    All IV's will be discontinued and monitored for bleeding.  All belongings will be returned to patient for patient to take home.  Last Documented Vital Signs:  Blood pressure 159/70, pulse 97, temperature 98.4 F (36.9 C), temperature source Oral, resp. rate 18, height 5\' 9"  (1.753 m), weight 91.627 kg (202 lb), SpO2 100 %.  Hendricks Limes RN, BS, BSN

## 2014-08-23 DIAGNOSIS — I823 Embolism and thrombosis of renal vein: Secondary | ICD-10-CM

## 2014-08-23 DIAGNOSIS — Z8719 Personal history of other diseases of the digestive system: Secondary | ICD-10-CM

## 2014-08-23 HISTORY — DX: Personal history of other diseases of the digestive system: Z87.19

## 2014-08-23 HISTORY — DX: Embolism and thrombosis of renal vein: I82.3

## 2014-08-24 LAB — CRYOGLOBULIN

## 2014-09-03 ENCOUNTER — Encounter: Payer: Self-pay | Admitting: Endocrinology

## 2014-09-03 ENCOUNTER — Ambulatory Visit (INDEPENDENT_AMBULATORY_CARE_PROVIDER_SITE_OTHER): Payer: Worker's Compensation | Admitting: Endocrinology

## 2014-09-03 VITALS — BP 130/82 | HR 93 | Temp 98.0°F | Resp 14 | Ht 69.0 in | Wt 182.2 lb

## 2014-09-03 DIAGNOSIS — D509 Iron deficiency anemia, unspecified: Secondary | ICD-10-CM

## 2014-09-03 DIAGNOSIS — E038 Other specified hypothyroidism: Secondary | ICD-10-CM | POA: Diagnosis not present

## 2014-09-03 DIAGNOSIS — E2749 Other adrenocortical insufficiency: Secondary | ICD-10-CM | POA: Diagnosis not present

## 2014-09-03 DIAGNOSIS — E271 Primary adrenocortical insufficiency: Secondary | ICD-10-CM

## 2014-09-03 DIAGNOSIS — D689 Coagulation defect, unspecified: Secondary | ICD-10-CM

## 2014-09-03 LAB — CBC WITH DIFFERENTIAL/PLATELET
BASOS ABS: 0 10*3/uL (ref 0.0–0.1)
Basophils Relative: 0.3 % (ref 0.0–3.0)
EOS ABS: 0 10*3/uL (ref 0.0–0.7)
Eosinophils Relative: 0.5 % (ref 0.0–5.0)
HCT: 31.3 % — ABNORMAL LOW (ref 39.0–52.0)
Hemoglobin: 10.7 g/dL — ABNORMAL LOW (ref 13.0–17.0)
LYMPHS PCT: 16.6 % (ref 12.0–46.0)
Lymphs Abs: 1 10*3/uL (ref 0.7–4.0)
MCHC: 34.3 g/dL (ref 30.0–36.0)
MCV: 84.8 fl (ref 78.0–100.0)
Monocytes Absolute: 0.3 10*3/uL (ref 0.1–1.0)
Monocytes Relative: 4.9 % (ref 3.0–12.0)
NEUTROS ABS: 4.7 10*3/uL (ref 1.4–7.7)
NEUTROS PCT: 77.7 % — AB (ref 43.0–77.0)
Platelets: 191 10*3/uL (ref 150.0–400.0)
RBC: 3.69 Mil/uL — ABNORMAL LOW (ref 4.22–5.81)
RDW: 13.5 % (ref 11.5–15.5)
WBC: 6 10*3/uL (ref 4.0–10.5)

## 2014-09-03 LAB — COMPREHENSIVE METABOLIC PANEL
ALK PHOS: 126 U/L — AB (ref 39–117)
ALT: 56 U/L — ABNORMAL HIGH (ref 0–53)
AST: 37 U/L (ref 0–37)
Albumin: 3 g/dL — ABNORMAL LOW (ref 3.5–5.2)
BUN: 20 mg/dL (ref 6–23)
CALCIUM: 8.5 mg/dL (ref 8.4–10.5)
CO2: 21 mEq/L (ref 19–32)
Chloride: 105 mEq/L (ref 96–112)
Creatinine, Ser: 0.92 mg/dL (ref 0.40–1.50)
GFR: 89.45 mL/min (ref >60.00)
GLUCOSE: 239 mg/dL — AB (ref 70–99)
Potassium: 3.9 mEq/L (ref 3.5–5.1)
Sodium: 135 mEq/L (ref 135–145)
Total Bilirubin: 0.5 mg/dL (ref 0.2–1.2)
Total Protein: 6.3 g/dL (ref 6.0–8.3)

## 2014-09-03 LAB — IBC PANEL
Iron: 36 ug/dL — ABNORMAL LOW (ref 42–165)
SATURATION RATIOS: 11.7 % — AB (ref 20.0–50.0)
Transferrin: 219 mg/dL (ref 212.0–360.0)

## 2014-09-03 LAB — TSH: TSH: 1.25 u[IU]/mL (ref 0.35–4.50)

## 2014-09-03 MED ORDER — FLUDROCORTISONE ACETATE 0.1 MG PO TABS: ORAL_TABLET | ORAL | Status: DC

## 2014-09-03 NOTE — Progress Notes (Signed)
Quick Note:  Please let patient know that sodium and potassium are okay, blood sugar high likely due to be from high dose hydrocortisone, still anemic and low iron present. Start OTC ferrous sulfate 325 mg daily. Follow-up with PCP also  ______

## 2014-09-03 NOTE — Patient Instructions (Signed)
HYDROCORTISONE regimen: From tonight reduce the doses to 3 tablets in the morning and 2 at suppertime  From Monday reduce the dose to 2 tablets with breakfast and one at suppertime  On Monday also START fludrocortisone 0.1 mg every second day until the next visit  If having any fever, acute illness or other physical stress like surgery will need to double or triple the dose of the hydrocortisone If having any vomiting and unable to take the medication please let us know as we will need to take an injectable hydrocortisone

## 2014-09-03 NOTE — Progress Notes (Signed)
Patient ID: Kenneth Cline, male   DOB: 1956-01-17, 59 y.o.   MRN: [id number]   Chief complaint: Follow-up of adrenal hemorrhage  History of Present Illness:   The patient apparently had progressive abdominal pain prior to his hospital admission on 08/10/14 This started about 1 or 2 days prior to his admission and was associated with some constipation He was also having some nausea but did not benefit any weakness or lightheadedness  On evaluation he was found to have a sodium of 126 and evidence of bilateral adrenal hemorrhage. His hospital course and discharge summary as well as labs are reviewed in detail He also apparently had a purpuric rash during hospitalization of unclear etiology Cortisol levels were found to be very low and around 1.0 even when tested with a Cortrosyn stimulation test  He was started on hydrocortisone in the hospital and continued on hydrocortisone 40 mg twice a day at the time of discharge His abdominal pain resolved and since then he has been able to eat fairly well He has not had any further nausea He does feel tired on some days, just about every other day but today feels good. Appetite has been normal Does not feel lightheaded standing up He has been compliant with taking his hydrocortisone at breakfast and supper, evening dose is about 6:30 PM   Past Medical History  Diagnosis Date  . Hypertension   . GERD (gastroesophageal reflux disease)   . Dyslipidemia   . Overweight (BMI 25.0-29.9)   . Erectile dysfunction   . Family history of premature coronary artery disease     2 brothers, one sister and father all with CAD.  One sister with valve disease.  . Stevens-Johnson disease     Past Surgical History  Procedure Laterality Date  . Cpet/met  11/27/2011    normal PFT, good effort-no ischemia burden  . Nm myocar perf wall motion  06/28/2006    EF 36% , LV systolic fx norm.  . Rotator cuff repair      Family History  Problem Relation Age of  Onset  . Heart attack Father 68    Diet cardiac arrest  . Heart disease Sister 35    triscuspid valve insuff,cardioversion  . Hypertension Sister     She is currently in her 71s as well  . Hypertension Brother   . Heart disease Brother 53    CABG; now age 84  . Ovarian cancer Maternal Grandmother   . Heart attack Brother 77    Died from cardiac arrest  . Hypertension Brother   . Hypertension Sister   . Heart disease Sister 14    CABG plus valve in 06/20/22;   . Sudden death Brother   . Sudden death Father 76  . Heart failure Sister     Died from complications of CHF following bowel surgery    Social History:  reports that he has never smoked. He has never used smokeless tobacco. He reports that he drinks about 1.5 oz of alcohol per week. He reports that he does not use illicit drugs.  Allergies:  Allergies  Allergen Reactions  . Penicillins     REACTION: rash  . Biaxin [Clarithromycin] Rash  . Keflex [Cephalexin] Rash      Medication List       This list is accurate as of: 09/03/14  4:49 PM.  Always use your most recent med list.               aspirin  EC 81 MG tablet  Take 81 mg by mouth daily.     cetirizine 10 MG tablet  Commonly known as:  ZYRTEC  Take 10 mg by mouth daily.     clonazePAM 0.5 MG tablet  Commonly known as:  KLONOPIN  Take 0.5 mg by mouth 2 (two) times daily as needed for anxiety.     ezetimibe 10 MG tablet  Commonly known as:  ZETIA  Take 1 tablet (10 mg total) by mouth daily.     fish oil-omega-3 fatty acids 1000 MG capsule  Take 2 g by mouth daily.     fludrocortisone 0.1 MG tablet  Commonly known as:  FLORINEF  1 tablet every second day     hydrocortisone 10 MG tablet  Commonly known as:  CORTEF  40 mg BID (once in AM and once in PM) for 2 weeks then decrease to 30 mg BID (once in AM and once in PM) for 2 weeks then decrease to 30 mg in AM and 20 mg in PM     hydrocortisone 25 MG suppository  Commonly known as:  ANUSOL-HC  Place  1 suppository (25 mg total) rectally 2 (two) times daily as needed for hemorrhoids or itching.     levothyroxine 25 MCG tablet  Commonly known as:  SYNTHROID, LEVOTHROID  Take 1 tablet (25 mcg total) by mouth daily before breakfast.     loperamide 2 MG tablet  Commonly known as:  IMODIUM A-D  2 now and one after each loose stool q1h prn max 8 in 24 hours     NASONEX 50 MCG/ACT nasal spray  Generic drug:  mometasone  Place 2 sprays into the nose daily as needed (allergies).     pantoprazole 40 MG tablet  Commonly known as:  PROTONIX  Take 1 tablet (40 mg total) by mouth daily.     PROBIOTIC DAILY PO  Take by mouth.     simethicone 80 MG chewable tablet  Commonly known as:  MYLICON  Chew 80 mg by mouth every 6 (six) hours as needed for flatulence.     traMADol 50 MG tablet  Commonly known as:  ULTRAM  Take 1 tablet (50 mg total) by mouth every 12 (twelve) hours as needed for moderate pain.        LABS:  Lab Results  Component Value Date   CREATININE 1.09 08/20/2014   BUN 12 08/20/2014   NA 132* 08/20/2014   K 3.5 08/20/2014   CL 102 08/20/2014   CO2 21 08/20/2014      REVIEW OF SYSTEMS:        Constitutional: As above.  He thinks he has lost about 15 pounds over the last month because of intercurrent illness  Eyes: no history of blurred vision   ENT: no nasal congestion, difficulty swallowing, no hoarseness   Cardiovascular: no chest pain or tightness on exertion.  No leg swelling.  No history of high blood pressure  Respiratory: no cough/shortness of breath  Gastrointestinal: no constipation, diarrhea, nausea or abdominal pain  Musculoskeletal: no muscle/joint aches.  He had repair of a rotator cuff on 08/03/14 which apparently resulted from injury.  Still having some residual pain  Skin: no rash recently, had purpuric rash noticed in the hospital along with increased ESR, CRP, decreased platelets down to 102,000, increased haptoglobin and APTT 54.   Vasculitis was suspected  Neurological: no headaches, numbness or tingling in feet or hands  Psychiatric: no depression  Endocrine: No heat  or cold intolerance, no fatigue prior to his shoulder surgery No history of diabetes.    Urological:   No frequency of urination or  Nocturia  He has been anemic in the hospital, not clear how long he has been anemic.  Also initially had low platelets  Lab Results  Component Value Date   WBC 6.0 09/03/2014   HGB 10.7* 09/03/2014   HCT 31.3* 09/03/2014   MCV 84.8 09/03/2014   PLT 191.0 09/03/2014       PHYSICAL EXAM:  BP 130/82 mmHg  Pulse 93  Temp(Src) 98 F (36.7 C)  Resp 14  Ht 5' 9"  (1.753 m)  Wt 182 lb 3.2 oz (82.645 kg)  BMI 26.89 kg/m2  SpO2 97%  Standing blood pressure 118/80  GENERAL: Averagely built and nourished, pleasant, looks well  No pallor, clubbing, lymphadenopathy or edema.  Skin:  no rash or pigmentation.  EYES:  Externally normal.   ENT: Oral mucosa and tongue normal.  No oral pigmentation  THYROID:  Not palpable.  HEART:  Normal  S1 and S2; no murmur or click.  CHEST:  Normal shape.  Lungs: Vescicular breath sounds heard equally.  No crepitations/ wheeze.  ABDOMEN:  No distention.  Liver and spleen not palpable.  No other mass Minimal tenderness at the right renal angle on gentle palpation  NEUROLOGICAL: .Reflexes are normal bilaterally at biceps and ankles.  JOINTS:  Normal and lower extremities and fingers  Extremities: No rash, pigmentation or peripheral edema  ASSESSMENT:   Acute bilateral adrenal hemorrhage not related to any trauma or iatrogenic anticoagulation Severe adrenal insufficiency following adrenal hemorrhage Currently on high-dose hydrocortisone since hospitalization, has continued taking 40 mg twice a day At this time does not have any significant orthostatic hypotension and overall clinically doing well  Etiology of adrenal hemorrhage is unclear and since he has positive  lupus anticoagulant and may have a coagulation disorder His hospital evaluation suggests 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  Appears to have been asymptomatic prior to hospitalization, has no evidence of goiter and not clear if this is a long-term issue or transient hypothyroidism   PLAN:    Start reducing hydrocortisone.  Will reduce it to 50 mg a day from tonight and then 30 mg a day from Monday  Start Florinef every other day from Monday  Check electrolytes today  Recheck thyroid levels today and if TSH low normal will stop thyroid supplement  Hematology opinion regarding coagulation disorder versus TTP or other etiology  Check iron and CBC and forward results to hematologist, may need further evaluation for anemia  Follow-up in 2 weeks  Discussed stress doses of hydrocortisone and need for parenteral injection if unable to take orally for any reason  Recommend that he continue hydrocortisone replacement at least for 3 months before reevaluating adrenal function  Follow-up CT scan of adrenals is scheduled and he will also see urologist after this.  However does not appear to have any surgical issue  Total face-to-face time including evaluation of history and physical, previous records, labs,radiological studies, discussion with case workeran counseling time =60 minutes    Kenneth Cline 09/03/2014, 4:49 PM   Addendum: Labs show normal electrolytes but glucose over 200 likely to be from steroids.  Need to be followed Thyroid okay, continue same dose Has iron deficiency anemia, to see PCP and start ferrous sulfate 325 mg Platelets normal

## 2014-09-09 HISTORY — PX: ERCP W/ PLASTIC STENT PLACEMENT: SHX1522

## 2014-09-11 ENCOUNTER — Ambulatory Visit: Payer: Self-pay | Admitting: Endocrinology

## 2014-09-14 ENCOUNTER — Ambulatory Visit: Payer: Self-pay | Admitting: Endocrinology

## 2014-09-17 ENCOUNTER — Ambulatory Visit: Payer: Self-pay | Admitting: Endocrinology

## 2014-10-16 ENCOUNTER — Ambulatory Visit: Payer: Self-pay | Admitting: Hematology and Oncology

## 2014-10-21 ENCOUNTER — Other Ambulatory Visit: Payer: Self-pay | Admitting: Cardiology

## 2014-10-21 NOTE — Telephone Encounter (Signed)
REFILL 

## 2014-11-10 HISTORY — PX: ERCP W/ SPHINCTEROTOMY AND BALLOON DILATION: SHX1524

## 2014-11-13 HISTORY — PX: LAPAROSCOPIC CHOLECYSTECTOMY W/ CHOLANGIOGRAPHY: SUR757

## 2015-01-20 ENCOUNTER — Encounter: Payer: Self-pay | Admitting: Cardiology

## 2015-04-05 ENCOUNTER — Other Ambulatory Visit: Payer: Self-pay | Admitting: Cardiology

## 2015-04-05 NOTE — Telephone Encounter (Signed)
REFILL 

## 2015-04-25 DIAGNOSIS — I639 Cerebral infarction, unspecified: Secondary | ICD-10-CM

## 2015-04-25 HISTORY — DX: Cerebral infarction, unspecified: I63.9

## 2015-04-25 HISTORY — PX: TRANSTHORACIC ECHOCARDIOGRAM: SHX275

## 2015-05-26 DIAGNOSIS — E119 Type 2 diabetes mellitus without complications: Secondary | ICD-10-CM

## 2015-05-26 HISTORY — DX: Type 2 diabetes mellitus without complications: E11.9

## 2015-06-01 HISTORY — PX: ERCP W/ METAL STENT PLACEMENT: SHX1521

## 2015-06-09 ENCOUNTER — Telehealth: Payer: Self-pay | Admitting: Cardiology

## 2015-06-09 NOTE — Telephone Encounter (Signed)
Routed to [place] --- regarding getting set up for coumadin clinic in our office.

## 2015-06-09 NOTE — Telephone Encounter (Signed)
Spoke with patient, set up for new coumadin appt on 2/27, post liver biopsy (2.22) will be on lovenox per [facility]

## 2015-06-09 NOTE — Telephone Encounter (Signed)
New Message  Pt calling to speak w/RN concerning having coumnadin followed by our office- has been seen @ [facility] for [place] but is swithcing to coumadin. Please call back and discuss.

## 2015-06-21 ENCOUNTER — Ambulatory Visit (INDEPENDENT_AMBULATORY_CARE_PROVIDER_SITE_OTHER): Payer: BLUE CROSS/BLUE SHIELD | Admitting: Pharmacist Clinician (PhC)/ Clinical Pharmacy Specialist

## 2015-06-21 DIAGNOSIS — Z7901 Long term (current) use of anticoagulants: Secondary | ICD-10-CM

## 2015-06-21 DIAGNOSIS — I823 Embolism and thrombosis of renal vein: Secondary | ICD-10-CM | POA: Insufficient documentation

## 2015-06-21 DIAGNOSIS — I639 Cerebral infarction, unspecified: Secondary | ICD-10-CM | POA: Insufficient documentation

## 2015-06-21 LAB — POCT INR: INR: 1.1

## 2015-06-24 ENCOUNTER — Ambulatory Visit (INDEPENDENT_AMBULATORY_CARE_PROVIDER_SITE_OTHER): Payer: BLUE CROSS/BLUE SHIELD | Admitting: Pharmacist Clinician (PhC)/ Clinical Pharmacy Specialist

## 2015-06-24 DIAGNOSIS — Z7901 Long term (current) use of anticoagulants: Secondary | ICD-10-CM

## 2015-06-24 DIAGNOSIS — I823 Embolism and thrombosis of renal vein: Secondary | ICD-10-CM

## 2015-06-24 LAB — POCT INR: INR: 2.1

## 2015-07-01 ENCOUNTER — Encounter: Payer: Self-pay | Admitting: Pharmacist Clinician (PhC)/ Clinical Pharmacy Specialist

## 2015-07-01 ENCOUNTER — Ambulatory Visit (INDEPENDENT_AMBULATORY_CARE_PROVIDER_SITE_OTHER): Payer: BLUE CROSS/BLUE SHIELD | Admitting: Pharmacist Clinician (PhC)/ Clinical Pharmacy Specialist

## 2015-07-01 DIAGNOSIS — Z7901 Long term (current) use of anticoagulants: Secondary | ICD-10-CM | POA: Diagnosis not present

## 2015-07-01 DIAGNOSIS — I823 Embolism and thrombosis of renal vein: Secondary | ICD-10-CM | POA: Diagnosis not present

## 2015-07-01 LAB — POCT INR: INR: 1.6

## 2015-07-08 ENCOUNTER — Ambulatory Visit (INDEPENDENT_AMBULATORY_CARE_PROVIDER_SITE_OTHER): Payer: BLUE CROSS/BLUE SHIELD | Admitting: Pharmacist Clinician (PhC)/ Clinical Pharmacy Specialist

## 2015-07-08 DIAGNOSIS — I823 Embolism and thrombosis of renal vein: Secondary | ICD-10-CM | POA: Diagnosis not present

## 2015-07-08 DIAGNOSIS — Z7901 Long term (current) use of anticoagulants: Secondary | ICD-10-CM | POA: Diagnosis not present

## 2015-07-08 LAB — POCT INR: INR: 2.2

## 2015-07-15 ENCOUNTER — Ambulatory Visit (INDEPENDENT_AMBULATORY_CARE_PROVIDER_SITE_OTHER): Payer: BLUE CROSS/BLUE SHIELD | Admitting: Pharmacist Clinician (PhC)/ Clinical Pharmacy Specialist

## 2015-07-15 DIAGNOSIS — I823 Embolism and thrombosis of renal vein: Secondary | ICD-10-CM | POA: Diagnosis not present

## 2015-07-15 DIAGNOSIS — Z7901 Long term (current) use of anticoagulants: Secondary | ICD-10-CM | POA: Diagnosis not present

## 2015-07-15 LAB — POCT INR: INR: 3.1

## 2015-07-22 ENCOUNTER — Ambulatory Visit (INDEPENDENT_AMBULATORY_CARE_PROVIDER_SITE_OTHER): Payer: BLUE CROSS/BLUE SHIELD | Admitting: Pharmacist Clinician (PhC)/ Clinical Pharmacy Specialist

## 2015-07-22 DIAGNOSIS — Z7901 Long term (current) use of anticoagulants: Secondary | ICD-10-CM

## 2015-07-22 DIAGNOSIS — I823 Embolism and thrombosis of renal vein: Secondary | ICD-10-CM | POA: Diagnosis not present

## 2015-07-22 LAB — POCT INR: INR: 1.7

## 2015-08-05 ENCOUNTER — Ambulatory Visit (INDEPENDENT_AMBULATORY_CARE_PROVIDER_SITE_OTHER): Payer: BLUE CROSS/BLUE SHIELD | Admitting: Pharmacist Clinician (PhC)/ Clinical Pharmacy Specialist

## 2015-08-05 DIAGNOSIS — Z7901 Long term (current) use of anticoagulants: Secondary | ICD-10-CM | POA: Diagnosis not present

## 2015-08-05 DIAGNOSIS — I823 Embolism and thrombosis of renal vein: Secondary | ICD-10-CM | POA: Diagnosis not present

## 2015-08-05 LAB — POCT INR: INR: 2.1

## 2015-08-09 ENCOUNTER — Other Ambulatory Visit: Payer: Self-pay | Admitting: Cardiology

## 2015-08-09 NOTE — Telephone Encounter (Signed)
Patient needs appt - last visit 2015.

## 2015-08-11 ENCOUNTER — Other Ambulatory Visit: Payer: Self-pay | Admitting: Cardiology

## 2015-08-18 ENCOUNTER — Telehealth: Payer: Self-pay | Admitting: Cardiology

## 2015-08-18 NOTE — Telephone Encounter (Signed)
Patient aware he needs to make an appointment to see Dr Ellyn Hack for upcoming

## 2015-08-18 NOTE — Telephone Encounter (Signed)
New message  Request for surgical clearance:  What type of surgery is being performed? ERCP  1. When is this surgery scheduled? 08/23/2015 at 11am   2. Are there any medications that need to be held prior to surgery and how long? Coumadin for 5 days will need a lovenox bridge   3. Name of physician performing surgery? Dr. Bertis Ruddy with [facility]   4. What is your office phone and fax number? Phone # [phone] Fax # No fax provided

## 2015-08-18 NOTE — Telephone Encounter (Signed)
Will bridge patient at 4/27 appointment.  Also will have patient schedule OV with Dr. Ellyn Hack

## 2015-08-18 NOTE — Telephone Encounter (Signed)
DEFERRED to Dr Ellyn Hack and pharmacist  For clearance  Patient has an appointment with anticoag. appointment 08/19/15 tomorrow Last appointment with Dr Ellyn Hack in 2015   At some point patient will need an appointment

## 2015-08-19 ENCOUNTER — Ambulatory Visit (INDEPENDENT_AMBULATORY_CARE_PROVIDER_SITE_OTHER): Payer: BLUE CROSS/BLUE SHIELD | Admitting: Pharmacist Clinician (PhC)/ Clinical Pharmacy Specialist

## 2015-08-19 DIAGNOSIS — I823 Embolism and thrombosis of renal vein: Secondary | ICD-10-CM

## 2015-08-19 DIAGNOSIS — Z7901 Long term (current) use of anticoagulants: Secondary | ICD-10-CM | POA: Diagnosis not present

## 2015-08-19 LAB — POCT INR: INR: 3

## 2015-08-19 MED ORDER — ENOXAPARIN SODIUM 80 MG/0.8ML ~~LOC~~ SOLN: 80.0000 mg | Freq: Two times a day (BID) | SUBCUTANEOUS | Status: DC

## 2015-08-19 NOTE — Patient Instructions (Addendum)
Enoxaprin Dosing Schedule  Enoxparin dose:80 mg  Date  Warfarin Dose (evenings) Enoxaprin Dose   6     5    4-27 4 0   4-28 3 0   4-29 2 0 9 am    9 pm  4-30 1 0 9 am  5-1 Procedure 7.5 mg (1.5 tabs)   5-2 1 10  mg (2 tabs) 9 am    9 pm  5-3 2 10  mg (2 tabs) 9 am    9 pm  5-4 3 10  mg (2 tabs) 9 am    9 pm  5-5 4 7.5 mg (1.5 tabs) 9 am    9 pm  5-6 5 7.5 mg (1.5 tabs) 9 am    9 pm  5-7 6 7.5 mg (1.5 tabs) 9 am    9 pm   Repeat INR on Monday May 8

## 2015-08-19 NOTE — Telephone Encounter (Signed)
OK - has been a long time since I have seen him.  Kenneth Man, MD

## 2015-08-23 HISTORY — PX: TRANSTHORACIC ECHOCARDIOGRAM: SHX275

## 2015-08-27 ENCOUNTER — Telehealth: Payer: Self-pay | Admitting: Cardiology

## 2015-08-27 NOTE — Telephone Encounter (Signed)
Pt cxl appt for Monday-was in hospital and Coumadin is "all messed up"-needs to talk to [facility]

## 2015-08-27 NOTE — Telephone Encounter (Signed)
Off coumadin until today, has been at [facility] appt with coumadin for Monday, but still has Dr. Ellyn Hack appt.  Will confer with him at that time to determine when to recheck INR.  Believes he will go home from [facility] or tomorrow, with lovenox and antibiotic.

## 2015-08-30 ENCOUNTER — Encounter: Payer: Self-pay | Admitting: Pharmacist Clinician (PhC)/ Clinical Pharmacy Specialist

## 2015-08-30 ENCOUNTER — Ambulatory Visit: Payer: Self-pay | Admitting: Cardiology

## 2015-08-31 ENCOUNTER — Other Ambulatory Visit: Payer: Self-pay | Admitting: Cardiology

## 2015-09-09 ENCOUNTER — Ambulatory Visit (INDEPENDENT_AMBULATORY_CARE_PROVIDER_SITE_OTHER): Payer: BLUE CROSS/BLUE SHIELD | Admitting: Pharmacist Clinician (PhC)/ Clinical Pharmacy Specialist

## 2015-09-09 DIAGNOSIS — I823 Embolism and thrombosis of renal vein: Secondary | ICD-10-CM

## 2015-09-09 DIAGNOSIS — Z7901 Long term (current) use of anticoagulants: Secondary | ICD-10-CM

## 2015-09-09 LAB — POCT INR: INR: 1.9

## 2015-09-16 ENCOUNTER — Ambulatory Visit (INDEPENDENT_AMBULATORY_CARE_PROVIDER_SITE_OTHER): Payer: BLUE CROSS/BLUE SHIELD | Admitting: Pharmacist

## 2015-09-16 DIAGNOSIS — Z7901 Long term (current) use of anticoagulants: Secondary | ICD-10-CM | POA: Diagnosis not present

## 2015-09-16 DIAGNOSIS — I823 Embolism and thrombosis of renal vein: Secondary | ICD-10-CM | POA: Diagnosis not present

## 2015-09-16 LAB — POCT INR: INR: 2.6

## 2015-09-21 ENCOUNTER — Ambulatory Visit (INDEPENDENT_AMBULATORY_CARE_PROVIDER_SITE_OTHER): Payer: BLUE CROSS/BLUE SHIELD | Admitting: Cardiology

## 2015-09-21 ENCOUNTER — Ambulatory Visit (INDEPENDENT_AMBULATORY_CARE_PROVIDER_SITE_OTHER): Payer: BLUE CROSS/BLUE SHIELD | Admitting: Pharmacist

## 2015-09-21 ENCOUNTER — Encounter: Payer: Self-pay | Admitting: Cardiology

## 2015-09-21 VITALS — BP 111/78 | HR 81 | Ht 69.0 in | Wt 171.8 lb

## 2015-09-21 DIAGNOSIS — I6329 Cerebral infarction due to unspecified occlusion or stenosis of other precerebral arteries: Secondary | ICD-10-CM

## 2015-09-21 DIAGNOSIS — E785 Hyperlipidemia, unspecified: Secondary | ICD-10-CM

## 2015-09-21 DIAGNOSIS — Z7901 Long term (current) use of anticoagulants: Secondary | ICD-10-CM | POA: Diagnosis not present

## 2015-09-21 DIAGNOSIS — I10 Essential (primary) hypertension: Secondary | ICD-10-CM | POA: Diagnosis not present

## 2015-09-21 DIAGNOSIS — I823 Embolism and thrombosis of renal vein: Secondary | ICD-10-CM

## 2015-09-21 LAB — POCT INR: INR: 2

## 2015-09-21 NOTE — Progress Notes (Signed)
PCP: Delia Chimes, NP  Clinic Note: Chief Complaint  Patient presents with  . Follow-up    cramping in legs  . Hypertension    HPI: Kenneth Cline is a 60 y.o. male with a PMH below (most notably for adrenal insufficiency, renal vein thrombosis with lupus endocrine: Positive previous and warfarin as well as recurrent cholangitis) who presents today for long overdue cardiology follow-up.  He himself does not have any diagnosed cardiac disease, does have multiple risk factors including a family history of significant coronary disease as Noted in family history  He has been evaluated so far with a treadmill nuclear stress test in 2008 there was no ischemic. He had a CPET-MET test done when I first met him in 2013. That was also relatively normal.  Ramel Tobon was last seen on 12/31/2013 - was relatively asymptomatic.  Since that visit he has had several complicated hospitalizations. [facility]   April 2016: adrenal hemorrhage with adrenal insufficiency thought to be related to shoulder surgery.  May 18: Recurrent cholangitis. He had an MRCP demonstrating common bile duct bifurcation stricturing. 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  ERCP 02/01/2015 - , bile duct stricture- 10 x 7 stent placed   ERCP 03/01/2015: Successful CLSO biliary stent removal without CBD stricture  05/13/2015: Cerebral/cerebellar stroke on Xarelto. Converted to warfarin. 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 No shunt. Full study: Basal septal LVH. Pseudo-normal filling (GR 2 DD). EF 57%.  Interval History: [name] returns today for evaluation cardiology standpoint. He has extremely complicated history multiple admissions and readmissions from his original choledocholithiasis related pancreatitis and cholangitis. Even after having his gallbladder out, he still has had recurrent cholangitis. He has not had any significant cardiac complications as a result of any these episodes. 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 Initially started on Xarelto but then switched to Coumadin after having had a stroke on Xarelto. As for any cardiac symptoms, he says that his blood pressure actually has not been up enough from the be on any blood pressure medications. He denies any resting or exertional chest tightness or pressure, but he does get a little bit winded walking around, simply because he has not done much over the last several months. He denies any  PND, orthopnea or edema. No palpitations, lightheadedness, dizziness, weakness or syncope/near syncope. No TIA/amaurosis fugax symptoms. No melena, hematochezia, hematuria, or epstaxis. No claudication.  He does note some cramping in legs, but not associated exertional.  ROS: A comprehensive was performed. Review of Systems  Constitutional: Positive for weight loss (Lots of weight loss following his illnesses). Negative for malaise/fatigue.  Eyes: Positive for double vision (since stroke).  Respiratory: Negative for wheezing.   Cardiovascular: Negative for chest pain, palpitations, orthopnea, claudication, leg  swelling  and PND.  Gastrointestinal: Positive for blood in stool (small amount of blood in stool  No workup yet).  Genitourinary: Positive for dysuria. Negative for hematuria.  Musculoskeletal: Negative for myalgias (cramping), back pain, joint pain and falls.  Neurological: Negative for dizziness, speech change, focal weakness, seizures, loss of consciousness and headaches.       No other residual post CVA Sx beside double vision.  Endo/Heme/Allergies: Does not bruise/bleed easily.  Psychiatric/Behavioral: Negative for depression and memory loss. The patient is not nervous/anxious and does not have insomnia.   All other systems reviewed and are negative.  Past Medical History  Diagnosis Date  . Essential hypertension   . GERD (gastroesophageal reflux disease)   . Dyslipidemia   . Overweight (BMI 25.0-29.9)   . Erectile dysfunction   . Family history of premature coronary artery disease     2 brothers, one sister and father all with CAD.  One sister with valve disease.  . Stevens-Johnson disease ([place]) 04/08/2013     Reaction to Biaxin  . Adrenal hemorrhage [facility]) April 2016    Following shoulder surgery  . Adrenal insufficiency, primary, hemorrhagic [facility]) April 2016  . Lupus anticoagulant with hemorrhagic disorder (HCC)     Previously on Xarelto. Now on warfarin  . Recurrent cholangitis July 2016; October and November 2016, February 2017    Initial ERCP with biliary stent placement followed by cholecystectomy forr cholecystitis  . CVA (cerebral infarction) January 2017    3 small areas of infarct in both the cerebrum as well as the cerebellum -- , located with diplopia  . Type 2 diabetes mellitus [facility]) February 2017  . 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 H/O acute cholangitis May 5885    Complicated by septic shock, acute pancreatitis and acute choledocholithiasis;   . Thrombosis of left renal vein [facility]) May 2016  . Septic shock due to Escherichia  coli [facility]) October 2016; January 2017    Escherichia coli bacteremia secondary to recurrent cholangitis -- status post ERCP  - since 07/2014: Adrenal hemorrhage ([place])  bilateral  . Adrenal insufficiency ([place]) 11/11/2014  . Allergy  . Cancer ([place])  . Cholangitis 11/11/2014 - with recurrent cholangitis. - cholecystectomy 11/13/14 . Cholecystitis 11/11/2014  . Cholelithiases  . Chronic kidney disease  . CVA (cerebral vascular accident) ([place]) 05/12/2015  . Cyclic neutropenia (HCC)  . GERD (gastroesophageal reflux disease)  . Hypercholesteremia  . Hypertension with goal to be determined  . Lupus anticoagulant (LAC) with hemorrhagic disorder - now on warfarin (was on Xarelto) . Pancreatitis  . Type 2 diabetes mellitus ([place]) 06/15/2015    Past Surgical History  Procedure Laterality Date  . Cpet/met  11/27/2011    normal PFT, good effort-no ischemia burden  . Nm myocar perf wall motion  06/28/2006    EF 02% , LV systolic fx norm.  . Rotator cuff repair    . Ercp w/ plastic stent placement  Sep 09 2014    [facility] [place] - Dr. Alvera Novel  . Ercp w/ sphincterotomy and balloon dilation  11/10/2014    [facility] -- Removal of biliary stent,, to PACU duodenitis  . Laparoscopic cholecystectomy w/ cholangiography  November 13 2014    [facility] [place], Dr. Devota Pace  . Ercp w/ metal stent placement  October 2016; November 2016    [facility] [place] - stent removal  . Ercp w/ metal stent placement  06/01/2015    [place]. Likely cholangitis  Prior to Admission medications   Medication Sig Start Date End Date Taking? Authorizing Provider  aspirin EC 81 MG tablet Take 81 mg by mouth daily.    Historical Provider, MD  cetirizine (ZYRTEC) 10 MG tablet Take 10 mg by mouth daily. Reported on 07/08/2015    Historical Provider, MD  Cholecalciferol (VITAMIN D-1000 MAX ST) 1000 units tablet Take 1 tablet by mouth daily.    Historical Provider, MD  clonazePAM (KLONOPIN) 0.5 MG tablet Take 0.5 mg by mouth 2  (two) times daily as needed for anxiety.    Historical Provider, MD  enoxaparin (LOVENOX) 80 MG/0.8ML injection Inject 0.8 mLs (80 mg total) into the skin every 12 (twelve) hours. 08/19/15   Leonie Man, MD  ferrous sulfate (FERROUSUL) 325 (65 FE) MG tablet Take 325 mg by mouth daily.    Historical Provider, MD  fish oil-omega-3 fatty acids 1000 MG capsule Take 2 g by mouth daily. Reported on 07/08/2015    Historical Provider, MD  fludrocortisone (FLORINEF) 0.1 MG tablet 1 tablet every second day Patient not taking: Reported on 07/08/2015 09/03/14   Elayne Snare, MD  hydrocortisone (ANUSOL-HC) 25 MG suppository Place 1 suppository (25 mg total) rectally 2 (two) times daily as needed for hemorrhoids or itching. 08/20/14   Geradine Girt, DO  hydrocortisone (CORTEF) 10 MG tablet 40 mg BID (once in AM and once in PM) for 2 weeks then decrease to 30 mg BID (once in AM and once in PM) for 2 weeks then decrease to 30 mg in AM and 20 mg in PM Patient taking differently: 20 mg in AM, 10 mg in PM 08/20/14   Geradine Girt, DO  levothyroxine (SYNTHROID, LEVOTHROID) 25 MCG tablet Take 1 tablet (25 mcg total) by mouth daily before breakfast. 08/20/14   Geradine Girt, DO  lisinopril (PRINIVIL,ZESTRIL) 20 MG tablet Take 20 mg by mouth. 05/13/15 08/11/15  Historical Provider, MD  loperamide (IMODIUM A-D) 2 MG tablet 2 now and one after each loose stool q1h prn max 8 in 24 hours Patient not taking: Reported on 07/08/2015 04/06/13   Roselee Culver, MD  metFORMIN (GLUCOPHAGE) 500 MG tablet Take 500 mg by mouth 2 (two) times daily. 04/13/15   Historical Provider, MD  NASONEX 50 MCG/ACT nasal spray Place 2 sprays into the nose daily as needed (allergies). Reported on 07/08/2015 12/09/13   Historical Provider, MD  pantoprazole (PROTONIX) 40 MG tablet Take 1 tablet (40 mg total) by mouth daily. <PLEASE MAKE APPOINTMENT FOR REFILLS> 08/09/15   Leonie Man, MD  pantoprazole (PROTONIX) 40 MG tablet TAKE 1 TABLET (40 MG TOTAL)  BY MOUTH DAILY. NEED OV. 08/31/15   Leonie Man, MD  Probiotic Product (PROBIOTIC DAILY PO) Take by mouth.    Historical Provider, MD  simethicone (MYLICON) 80 MG chewable tablet Chew 80 mg by mouth every 6 (six) hours as needed for flatulence. Reported on 07/08/2015    Historical Provider, MD  traMADol (ULTRAM) 50 MG tablet Take 1 tablet (50 mg total) by mouth every 12 (twelve) hours as needed for moderate pain. Patient not taking: Reported on 07/08/2015 08/20/14   Geradine Girt, DO  warfarin (COUMADIN) 5 MG tablet Take 1 to 1.5 tablets by mouth daily as directed by coumadin clinic 06/09/15   Historical Provider, MD  ZETIA 10 MG tablet TAKE 1 TABLET (10 MG TOTAL) BY MOUTH DAILY. NEED OV. Patient not taking: Reported on 07/08/2015 04/05/15   Leonie Man, MD  Allergies  Allergen Reactions  . Penicillins     REACTION: rash  . Biaxin [Clarithromycin] Rash  . Keflex [Cephalexin] Rash     Social History   Social History  . Marital Status: Married    Spouse Name: [name]  . Number of Children: 2  . Years of Education: [name]   Occupational History  .  Ups   Social History Main Topics  . Smoking status: Never Smoker   . Smokeless tobacco: Never Used  . Alcohol Use: 1.5 oz/week    3 Standard drinks or equivalent per week     Comment: occassional  . Drug Use: No  . Sexual Activity: Not Asked   Other Topics Concern  . None   Social History Narrative   He is a married father of 2 with 3 stepchildren.  He has 3 grandchildren his own and 3 step grandchildren.  He works as a Dealer for [employer].  He lives with his wife, [name], of 8 years.  He does not, and never did smoke.  He takes occasional alcohol beverage but nothing significant.  He does not do routine exercise, but does walk a lot at work.   family history includes Heart attack (age of onset: 84) in his brother; Heart attack (age of onset: 9) in his father; Heart disease (age of onset: 22) in his sister; Heart disease (age of onset:  71) in his brother; Heart disease (age of onset: 31) in his sister; Heart failure in his sister; Hypertension in his brother, brother, sister, and sister; Ovarian cancer in his maternal grandmother; Sudden death in his brother; Sudden death (age of onset: 8) in his father.   Brother: CABG at age 28, now 14.   Brother (twin sister with CABG): Died following MI with cardiac arrest at age 60   Sister: CABG in her 58s. Diet CHF after bowel surgery.   Second Sister: History of arrhythmias and valve disease   Paternal grandmother died a couple cases of childbirth related CHF (peripartum cardiomyopathy)   Wt Readings from Last 3 Encounters:  09/21/15 171 lb 12.8 oz (77.928 kg)  09/03/14 182 lb 3.2 oz (82.645 kg)  08/10/14 202 lb (91.627 kg)    PHYSICAL EXAM BP 111/78 mmHg  Pulse 81  Ht 5' 9"  (1.753 m)  Wt 171 lb 12.8 oz (77.928 kg)  BMI 25.36 kg/m2 General appearance: alert, cooperative, appears stated age, no distress and Essentially healthy appearing  Neck: no adenopathy, no carotid bruit and no JVD Lungs: clear to auscultation bilaterally, normal percussion bilaterally and non-labored Heart: regular rate and rhythm, S1 and S2 normal, no murmur, click, rub or gallop; nondisplaced PMI. Abdomen: soft, non-tender; bowel sounds normal; no masses,  no organomegaly; mild tenderness, no HSM/HJR Extremities: extremities normal, atraumatic, no cyanosis, and edema Pulses: 2+ and symmetric; Skin: mobility and turgor normal or  Neurologic: Mental status: Alert, oriented, thought content appropriate Cranial nerves: normal (II-XII grossly intact)    Adult ECG Report  Rate: 81 ;  Rhythm: normal sinus rhythm and Minimal voltage for LVH. Minimal repolarization abnormality. Otherwise normal axis, intervals, durations.;   Narrative Interpretation: Relatively stable EKG.   Other studies Reviewed: Additional studies/ records that were reviewed today include:  Recent Labs:  No recent labs from  [facility]  Sodium 143, potassium 3.4, chloride 105, by car 29, BUN 18, creatinine 0.7. Calcium 8.1. Albumin 2.7, total bilirubin 1.2. Alkaline phosphatase 373, AST 79, ALT 107. Stable;; non-HDL cholesterol January 2017 was 154. Full panel not visible.  ASSESSMENT / PLAN: Problem List Items Addressed This Visit    Long term (current) use of anticoagulants (Chronic)    INR followed here at [facility] office      Hypertension, essential, benign (Chronic)    He had previously been on ACE inhibitor/HCTZ combination. He with all his hospitalizations, he has been septic, has had renal insufficiency etc. Blood pressure is actually been relatively low. He has not been back on anything for blood pressure      Relevant Orders   EKG 12-Lead   Dyslipidemia - Primary (Chronic)    His labs are monitored by his PCP. But he is not on anything currently. Previously on Zetia. Would shoot for LDL close to 100      Relevant Orders   EKG 12-Lead   CVA (cerebral infarction) (Chronic)    No clear indication as what the etiology was. Was switched from Xarelto to warfarin. No bleeding issues.        Essentially no active cardiac issues going on now. Current medicines are reviewed at length with the patient today. (+/- concerns) None The following changes have been made: None Studies Ordered:   Orders Placed This Encounter  Procedures  . EKG 12-Lead   Extensive chart review of his multiple hospitalizations at [place]. Medically not complicated.  ROV 1 year.    Glenetta Hew, M.D., M.S. Interventional Cardiologist   Pager # [phone] Phone # [phone] [address] [place], [place] [address]

## 2015-09-21 NOTE — Patient Instructions (Signed)
No change in current medications  Your physician wants you to follow-up in 12 months Dr Ruby Cola. You will receive a reminder letter in the mail two months in advance. If you don't receive a letter, please call our office to schedule the follow-up appointment.   If you need a refill on your cardiac medications before your next appointment, please call your pharmacy.

## 2015-09-23 ENCOUNTER — Encounter: Payer: Self-pay | Admitting: Cardiology

## 2015-09-23 NOTE — Assessment & Plan Note (Signed)
He had previously been on ACE inhibitor/HCTZ combination. He with all his hospitalizations, he has been septic, has had renal insufficiency etc. Blood pressure is actually been relatively low. He has not been back on anything for blood pressure

## 2015-09-23 NOTE — Assessment & Plan Note (Signed)
His labs are monitored by his PCP. But he is not on anything currently. Previously on Zetia. Would shoot for LDL close to 100

## 2015-09-23 NOTE — Assessment & Plan Note (Signed)
No clear indication as what the etiology was. Was switched from Xarelto to warfarin. No bleeding issues.

## 2015-09-23 NOTE — Assessment & Plan Note (Signed)
INR followed here at [facility] office

## 2015-10-11 ENCOUNTER — Other Ambulatory Visit: Payer: Self-pay | Admitting: Cardiology

## 2015-10-12 ENCOUNTER — Ambulatory Visit (INDEPENDENT_AMBULATORY_CARE_PROVIDER_SITE_OTHER): Payer: BLUE CROSS/BLUE SHIELD | Admitting: Pharmacist Clinician (PhC)/ Clinical Pharmacy Specialist

## 2015-10-12 DIAGNOSIS — Z7901 Long term (current) use of anticoagulants: Secondary | ICD-10-CM | POA: Diagnosis not present

## 2015-10-12 DIAGNOSIS — I6329 Cerebral infarction due to unspecified occlusion or stenosis of other precerebral arteries: Secondary | ICD-10-CM | POA: Diagnosis not present

## 2015-10-12 DIAGNOSIS — I823 Embolism and thrombosis of renal vein: Secondary | ICD-10-CM | POA: Diagnosis not present

## 2015-10-12 LAB — POCT INR: INR: 1.7

## 2015-10-29 ENCOUNTER — Ambulatory Visit (INDEPENDENT_AMBULATORY_CARE_PROVIDER_SITE_OTHER): Payer: BLUE CROSS/BLUE SHIELD | Admitting: Pharmacist Clinician (PhC)/ Clinical Pharmacy Specialist

## 2015-10-29 DIAGNOSIS — I6329 Cerebral infarction due to unspecified occlusion or stenosis of other precerebral arteries: Secondary | ICD-10-CM | POA: Diagnosis not present

## 2015-10-29 DIAGNOSIS — Z7901 Long term (current) use of anticoagulants: Secondary | ICD-10-CM | POA: Diagnosis not present

## 2015-10-29 DIAGNOSIS — I823 Embolism and thrombosis of renal vein: Secondary | ICD-10-CM

## 2015-10-29 LAB — POCT INR: INR: 3.8

## 2015-11-12 ENCOUNTER — Ambulatory Visit (INDEPENDENT_AMBULATORY_CARE_PROVIDER_SITE_OTHER): Payer: BLUE CROSS/BLUE SHIELD | Admitting: Pharmacist

## 2015-11-12 DIAGNOSIS — I6329 Cerebral infarction due to unspecified occlusion or stenosis of other precerebral arteries: Secondary | ICD-10-CM | POA: Diagnosis not present

## 2015-11-12 DIAGNOSIS — Z7901 Long term (current) use of anticoagulants: Secondary | ICD-10-CM

## 2015-11-12 DIAGNOSIS — I823 Embolism and thrombosis of renal vein: Secondary | ICD-10-CM

## 2015-11-12 LAB — POCT INR: INR: 2.2

## 2015-11-14 ENCOUNTER — Other Ambulatory Visit: Payer: Self-pay | Admitting: Cardiology

## 2015-11-16 ENCOUNTER — Other Ambulatory Visit: Payer: Self-pay | Admitting: *Deleted

## 2015-11-16 MED ORDER — PANTOPRAZOLE SODIUM 40 MG PO TBEC: 40.0000 mg | DELAYED_RELEASE_TABLET | Freq: Every day | ORAL | 3 refills | Status: DC

## 2015-12-03 ENCOUNTER — Ambulatory Visit (INDEPENDENT_AMBULATORY_CARE_PROVIDER_SITE_OTHER): Payer: BLUE CROSS/BLUE SHIELD | Admitting: Pharmacist

## 2015-12-03 DIAGNOSIS — I6329 Cerebral infarction due to unspecified occlusion or stenosis of other precerebral arteries: Secondary | ICD-10-CM | POA: Diagnosis not present

## 2015-12-03 DIAGNOSIS — I823 Embolism and thrombosis of renal vein: Secondary | ICD-10-CM | POA: Diagnosis not present

## 2015-12-03 DIAGNOSIS — Z7901 Long term (current) use of anticoagulants: Secondary | ICD-10-CM | POA: Diagnosis not present

## 2015-12-03 LAB — POCT INR: INR: 2.5

## 2015-12-30 ENCOUNTER — Ambulatory Visit (INDEPENDENT_AMBULATORY_CARE_PROVIDER_SITE_OTHER): Payer: BLUE CROSS/BLUE SHIELD | Admitting: Pharmacist Clinician (PhC)/ Clinical Pharmacy Specialist

## 2015-12-30 DIAGNOSIS — I823 Embolism and thrombosis of renal vein: Secondary | ICD-10-CM

## 2015-12-30 DIAGNOSIS — Z7901 Long term (current) use of anticoagulants: Secondary | ICD-10-CM | POA: Diagnosis not present

## 2015-12-30 DIAGNOSIS — I6329 Cerebral infarction due to unspecified occlusion or stenosis of other precerebral arteries: Secondary | ICD-10-CM | POA: Diagnosis not present

## 2015-12-30 LAB — POCT INR: INR: 3.2

## 2016-01-19 ENCOUNTER — Ambulatory Visit (INDEPENDENT_AMBULATORY_CARE_PROVIDER_SITE_OTHER): Payer: BLUE CROSS/BLUE SHIELD | Admitting: Pharmacist Clinician (PhC)/ Clinical Pharmacy Specialist

## 2016-01-19 DIAGNOSIS — I6329 Cerebral infarction due to unspecified occlusion or stenosis of other precerebral arteries: Secondary | ICD-10-CM | POA: Diagnosis not present

## 2016-01-19 DIAGNOSIS — Z7901 Long term (current) use of anticoagulants: Secondary | ICD-10-CM | POA: Diagnosis not present

## 2016-01-19 DIAGNOSIS — I823 Embolism and thrombosis of renal vein: Secondary | ICD-10-CM | POA: Diagnosis not present

## 2016-01-19 LAB — POCT INR: INR: 3

## 2016-02-07 ENCOUNTER — Ambulatory Visit (INDEPENDENT_AMBULATORY_CARE_PROVIDER_SITE_OTHER): Payer: BLUE CROSS/BLUE SHIELD | Admitting: Pharmacist

## 2016-02-07 DIAGNOSIS — I823 Embolism and thrombosis of renal vein: Secondary | ICD-10-CM | POA: Diagnosis not present

## 2016-02-07 DIAGNOSIS — Z7901 Long term (current) use of anticoagulants: Secondary | ICD-10-CM

## 2016-02-07 LAB — POCT INR: INR: 3.7

## 2016-02-21 ENCOUNTER — Ambulatory Visit (INDEPENDENT_AMBULATORY_CARE_PROVIDER_SITE_OTHER): Payer: BLUE CROSS/BLUE SHIELD | Admitting: Pharmacist Clinician (PhC)/ Clinical Pharmacy Specialist

## 2016-02-21 DIAGNOSIS — Z7901 Long term (current) use of anticoagulants: Secondary | ICD-10-CM | POA: Diagnosis not present

## 2016-02-21 DIAGNOSIS — I823 Embolism and thrombosis of renal vein: Secondary | ICD-10-CM | POA: Diagnosis not present

## 2016-02-21 LAB — POCT INR: INR: 2.6

## 2016-03-13 ENCOUNTER — Ambulatory Visit (INDEPENDENT_AMBULATORY_CARE_PROVIDER_SITE_OTHER): Payer: BLUE CROSS/BLUE SHIELD | Admitting: Pharmacist

## 2016-03-13 DIAGNOSIS — I823 Embolism and thrombosis of renal vein: Secondary | ICD-10-CM | POA: Diagnosis not present

## 2016-03-13 DIAGNOSIS — Z7901 Long term (current) use of anticoagulants: Secondary | ICD-10-CM

## 2016-03-13 LAB — POCT INR: INR: 2.7

## 2016-03-29 ENCOUNTER — Telehealth: Payer: Self-pay | Admitting: Pharmacist Clinician (PhC)/ Clinical Pharmacy Specialist

## 2016-03-29 NOTE — Telephone Encounter (Signed)
Spoke with wife.  Patient is at [facility] and will need a procedure there in January.  Will need to hold warfarin x 5 days and they would like him to be bridged.  They are to fax information to [place] today and we can contact his wife to schedule appt and review forms.

## 2016-04-03 ENCOUNTER — Ambulatory Visit (INDEPENDENT_AMBULATORY_CARE_PROVIDER_SITE_OTHER): Payer: BLUE CROSS/BLUE SHIELD | Admitting: Pharmacist Clinician (PhC)/ Clinical Pharmacy Specialist

## 2016-04-03 ENCOUNTER — Other Ambulatory Visit: Payer: Self-pay | Admitting: Cardiology

## 2016-04-03 DIAGNOSIS — I823 Embolism and thrombosis of renal vein: Secondary | ICD-10-CM | POA: Diagnosis not present

## 2016-04-03 DIAGNOSIS — Z7901 Long term (current) use of anticoagulants: Secondary | ICD-10-CM | POA: Diagnosis not present

## 2016-04-03 LAB — POCT INR: INR: 3.5

## 2016-04-03 NOTE — Telephone Encounter (Signed)
Patient in office for INR check.  He will clarify if the labs need to be drawn now or just prior to his procedure. [facility] sent lab request for CBC and SCr with GFR).  Procedure scheduled for Jan 15 at [facility] (bile duct biopsy).   They are also requesting patient have lovenox bridge to come off warfarin prior to procedure.    When get results of labs will fax back to Mikel Cella APRN fax [phone]

## 2016-04-03 NOTE — Telephone Encounter (Signed)
Received lab orders to have CBC and creatinine w/GFR drawn prior to return to [facility] in January for a procedure.     LMOM for wife to determine when they will go back, so that we can get blood drawn at appropriate time, as well as coordinate a lovenox bridge for him.

## 2016-04-25 ENCOUNTER — Ambulatory Visit (INDEPENDENT_AMBULATORY_CARE_PROVIDER_SITE_OTHER): Payer: BLUE CROSS/BLUE SHIELD | Admitting: Pharmacist

## 2016-04-25 DIAGNOSIS — I823 Embolism and thrombosis of renal vein: Secondary | ICD-10-CM

## 2016-04-25 DIAGNOSIS — Z7901 Long term (current) use of anticoagulants: Secondary | ICD-10-CM

## 2016-04-25 LAB — POCT INR: INR: 3

## 2016-04-25 MED ORDER — ENOXAPARIN SODIUM 80 MG/0.8ML ~~LOC~~ SOLN: 80.0000 mg | Freq: Two times a day (BID) | SUBCUTANEOUS | 0 refills | Status: DC

## 2016-04-25 NOTE — Patient Instructions (Addendum)
January/9: Last dose of Coumadin.  January/10: No Coumadin or Lovenox.  January/11: Inject Lovenox 80mg  in the fatty abdominal tissue at least 2 inches from the belly button twice a day about 12 hours apart, 8am and 8pm rotate sites. No Coumadin.  January/12: Inject Lovenox in the fatty tissue every 12 hours, 8am and 8pm. No Coumadin.  January/13: Inject Lovenox in the fatty tissue every 12 hours, 8am and 8pm. No Coumadin.  January/14: Inject Lovenox in the fatty tissue in the morning at 8 am (No PM dose). No Coumadin.  January/15: Procedure Day - No Lovenox - Resume Coumadin in the evening or as directed by doctor (take an extra half tablet with usual dose for 2 days then resume normal dose).  January/16: Resume Lovenox inject in the fatty tissue every 12 hours and take Coumadin.  January/17: Inject Lovenox in the fatty tissue every 12 hours and take Coumadin.  January/18: Inject Lovenox in the fatty tissue every 12 hours and take Coumadin.  January/19: Inject Lovenox in the fatty tissue every 12 hours and take Coumadin.  January/20: Inject Lovenox in the fatty tissue every 12 hours and take Coumadin.  January/21: Inject Lovenox in the fatty tissue every 12 hours and take Coumadin.  January/22: Coumadin appt to check INR.

## 2016-05-04 ENCOUNTER — Telehealth: Payer: Self-pay | Admitting: *Deleted

## 2016-05-04 DIAGNOSIS — D6862 Lupus anticoagulant syndrome: Secondary | ICD-10-CM

## 2016-05-04 LAB — CBC WITH DIFFERENTIAL/PLATELET
BASOS ABS: 84 {cells}/uL (ref 0–200)
Basophils Relative: 1 %
EOS PCT: 1 %
Eosinophils Absolute: 84 cells/uL (ref 15–500)
HCT: 44.3 % (ref 38.5–50.0)
HEMOGLOBIN: 14.8 g/dL (ref 13.2–17.1)
LYMPHS ABS: 2352 {cells}/uL (ref 850–3900)
Lymphocytes Relative: 28 %
MCH: 29.4 pg (ref 27.0–33.0)
MCHC: 33.4 g/dL (ref 32.0–36.0)
MCV: 88.1 fL (ref 80.0–100.0)
MPV: 9.4 fL (ref 7.5–12.5)
Monocytes Absolute: 588 cells/uL (ref 200–950)
Monocytes Relative: 7 %
Neutro Abs: 5292 cells/uL (ref 1500–7800)
Neutrophils Relative %: 63 %
Platelets: 226 10*3/uL (ref 140–400)
RBC: 5.03 MIL/uL (ref 4.20–5.80)
RDW: 14.7 % (ref 11.0–15.0)
WBC: 8.4 10*3/uL (ref 3.8–10.8)

## 2016-05-04 LAB — CREATININE WITH EST GFR
Creat: 1.17 mg/dL (ref 0.70–1.25)
GFR, EST NON AFRICAN AMERICAN: 67 mL/min (ref >=60)
GFR, Est African American: 78 mL/min (ref >=60)

## 2016-05-04 NOTE — Telephone Encounter (Signed)
Raiann w [facility] called - patient presented w labwork orders from [facility], which they cannot accept. Asking if OK for our office to order CBC and Creat w GFR. (ICD10 code on his paperwork is D68.62 for Lupus anticoag syndrome. She explained that patient has been in touch w [facility] Medal about these tests.  Discussed w Erasmo Downer, advised OK to put in orders. She is aware of situation, also noted patient will need results in hand for his appt Monday at [facility] in [place]. Called Raiann back and let her know we can do these orders for [facility] but that [facility] clinic will need results.  Through discussion w Raiann and patient, determined that results would be back tomorrow, we could print these off and notify patient he can pick up at office to ensure he'll have in hand for [facility] clinic appt next week. Pt's best number to call back is [phone].   Please keep on the lookout for these results tomorrow and notify patient once resulted.

## 2016-05-05 NOTE — Telephone Encounter (Signed)
Notified patient, lab result information is ready for pick up Verbalized understanding. States he will come by today

## 2016-05-15 ENCOUNTER — Telehealth: Payer: Self-pay | Admitting: Pharmacist

## 2016-05-15 NOTE — Telephone Encounter (Signed)
  Patient's wife called to informed patient recently back from procedure at [facility] on Monday, January 15th. Currently feeling tired, and had dark tarry stools today.  Wants to come to clinic to check INR today if possible.   Patient instructed to HOLD all anticoagulation (warfarin and enoxaparin) and go ne nearest Emergency Room to complete assessment for possible GI bleed or post-surgery complications.

## 2016-05-22 ENCOUNTER — Other Ambulatory Visit: Payer: Self-pay | Admitting: Cardiology

## 2016-05-22 MED ORDER — ENOXAPARIN SODIUM 80 MG/0.8ML ~~LOC~~ SOLN: 80.0000 mg | Freq: Two times a day (BID) | SUBCUTANEOUS | 0 refills | Status: DC

## 2016-05-23 ENCOUNTER — Ambulatory Visit (INDEPENDENT_AMBULATORY_CARE_PROVIDER_SITE_OTHER): Payer: BLUE CROSS/BLUE SHIELD | Admitting: Pharmacist

## 2016-05-23 DIAGNOSIS — I823 Embolism and thrombosis of renal vein: Secondary | ICD-10-CM | POA: Diagnosis not present

## 2016-05-23 DIAGNOSIS — Z7901 Long term (current) use of anticoagulants: Secondary | ICD-10-CM

## 2016-05-23 LAB — POCT INR: INR: 1.2

## 2016-05-25 ENCOUNTER — Other Ambulatory Visit: Payer: Self-pay | Admitting: Pharmacist Clinician (PhC)/ Clinical Pharmacy Specialist

## 2016-05-25 MED ORDER — WARFARIN SODIUM 5 MG PO TABS: ORAL_TABLET | ORAL | 3 refills | Status: DC

## 2016-05-26 ENCOUNTER — Ambulatory Visit (INDEPENDENT_AMBULATORY_CARE_PROVIDER_SITE_OTHER): Payer: BLUE CROSS/BLUE SHIELD | Admitting: Pharmacist

## 2016-05-26 DIAGNOSIS — Z7901 Long term (current) use of anticoagulants: Secondary | ICD-10-CM

## 2016-05-26 DIAGNOSIS — I823 Embolism and thrombosis of renal vein: Secondary | ICD-10-CM | POA: Diagnosis not present

## 2016-05-26 LAB — POCT INR: INR: 1.4

## 2016-05-31 ENCOUNTER — Ambulatory Visit (INDEPENDENT_AMBULATORY_CARE_PROVIDER_SITE_OTHER): Payer: BLUE CROSS/BLUE SHIELD | Admitting: Pharmacist

## 2016-05-31 DIAGNOSIS — Z7901 Long term (current) use of anticoagulants: Secondary | ICD-10-CM

## 2016-05-31 DIAGNOSIS — I823 Embolism and thrombosis of renal vein: Secondary | ICD-10-CM | POA: Diagnosis not present

## 2016-05-31 LAB — POCT INR: INR: 2.1

## 2016-06-14 ENCOUNTER — Ambulatory Visit (INDEPENDENT_AMBULATORY_CARE_PROVIDER_SITE_OTHER): Payer: BLUE CROSS/BLUE SHIELD | Admitting: Pharmacist

## 2016-06-14 DIAGNOSIS — Z7901 Long term (current) use of anticoagulants: Secondary | ICD-10-CM | POA: Diagnosis not present

## 2016-06-14 DIAGNOSIS — I823 Embolism and thrombosis of renal vein: Secondary | ICD-10-CM

## 2016-06-14 LAB — POCT INR: INR: 2

## 2016-06-14 MED ORDER — ENOXAPARIN SODIUM 80 MG/0.8ML ~~LOC~~ SOLN: 80.0000 mg | Freq: Two times a day (BID) | SUBCUTANEOUS | 0 refills | Status: DC

## 2016-06-14 NOTE — Patient Instructions (Signed)
February/20: Last dose of Coumadin.  February/21: No Coumadin or Lovenox.  February/22: Inject Lovenox 80mg  in the fatty abdominal tissue at least 2 inches from the belly button twice a day about 12 hours apart, 8am and 8pm rotate sites. No Coumadin.  February/23: Inject Lovenox in the fatty tissue every 12 hours, 8am and 8pm. No Coumadin.  February/24: Inject Lovenox in the fatty tissue every 12 hours, 8am and 8pm. No Coumadin.  February/25: Inject Lovenox in the fatty tissue in the morning at 8 am (No PM dose). No Coumadin.  February/26: Procedure Day - No Lovenox - Resume Coumadin in the evening or as directed by doctor (take an extra half tablet with usual dose for 2 days then resume normal dose).  February/27: Resume Lovenox inject in the fatty tissue every 12 hours and take Coumadin.  February/28: Inject Lovenox in the fatty tissue every 12 hours and take Coumadin.  March/1: Inject Lovenox in the fatty tissue every 12 hours and take Coumadin.  March/2: Inject Lovenox in the fatty tissue every 12 hours and take Coumadin.  March/3: Inject Lovenox in the fatty tissue every 12 hours and take Coumadin.  March/4: Inject Lovenox in the fatty tissue every 12 hours and take Coumadin.  March/5: Coumadin appt to check INR.

## 2016-06-26 ENCOUNTER — Ambulatory Visit (INDEPENDENT_AMBULATORY_CARE_PROVIDER_SITE_OTHER): Payer: BLUE CROSS/BLUE SHIELD | Admitting: Pharmacist Clinician (PhC)/ Clinical Pharmacy Specialist

## 2016-06-26 DIAGNOSIS — Z7901 Long term (current) use of anticoagulants: Secondary | ICD-10-CM

## 2016-06-26 DIAGNOSIS — I823 Embolism and thrombosis of renal vein: Secondary | ICD-10-CM

## 2016-06-26 LAB — POCT INR: INR: 1.2

## 2016-06-28 ENCOUNTER — Ambulatory Visit (INDEPENDENT_AMBULATORY_CARE_PROVIDER_SITE_OTHER): Payer: BLUE CROSS/BLUE SHIELD | Admitting: Pharmacist

## 2016-06-28 DIAGNOSIS — I823 Embolism and thrombosis of renal vein: Secondary | ICD-10-CM

## 2016-06-28 DIAGNOSIS — Z7901 Long term (current) use of anticoagulants: Secondary | ICD-10-CM | POA: Diagnosis not present

## 2016-06-28 LAB — POCT INR: INR: 1.9

## 2016-07-05 ENCOUNTER — Ambulatory Visit (INDEPENDENT_AMBULATORY_CARE_PROVIDER_SITE_OTHER): Payer: BLUE CROSS/BLUE SHIELD | Admitting: Pharmacist

## 2016-07-05 DIAGNOSIS — Z7901 Long term (current) use of anticoagulants: Secondary | ICD-10-CM

## 2016-07-05 DIAGNOSIS — I823 Embolism and thrombosis of renal vein: Secondary | ICD-10-CM | POA: Diagnosis not present

## 2016-07-05 LAB — POCT INR: INR: 2.7

## 2016-07-26 ENCOUNTER — Ambulatory Visit (INDEPENDENT_AMBULATORY_CARE_PROVIDER_SITE_OTHER): Payer: BLUE CROSS/BLUE SHIELD | Admitting: Pharmacist Clinician (PhC)/ Clinical Pharmacy Specialist

## 2016-07-26 DIAGNOSIS — Z7901 Long term (current) use of anticoagulants: Secondary | ICD-10-CM

## 2016-07-26 DIAGNOSIS — I823 Embolism and thrombosis of renal vein: Secondary | ICD-10-CM

## 2016-07-26 LAB — POCT INR: INR: 2.5

## 2016-08-22 ENCOUNTER — Ambulatory Visit (INDEPENDENT_AMBULATORY_CARE_PROVIDER_SITE_OTHER): Payer: BLUE CROSS/BLUE SHIELD | Admitting: Pharmacist Clinician (PhC)/ Clinical Pharmacy Specialist

## 2016-08-22 DIAGNOSIS — I823 Embolism and thrombosis of renal vein: Secondary | ICD-10-CM | POA: Diagnosis not present

## 2016-08-22 DIAGNOSIS — Z7901 Long term (current) use of anticoagulants: Secondary | ICD-10-CM | POA: Diagnosis not present

## 2016-08-22 LAB — POCT INR: INR: 2.6

## 2016-09-15 ENCOUNTER — Other Ambulatory Visit: Payer: Self-pay | Admitting: Cardiology

## 2016-09-19 ENCOUNTER — Ambulatory Visit (INDEPENDENT_AMBULATORY_CARE_PROVIDER_SITE_OTHER): Payer: BLUE CROSS/BLUE SHIELD | Admitting: Pharmacist

## 2016-09-19 DIAGNOSIS — I823 Embolism and thrombosis of renal vein: Secondary | ICD-10-CM

## 2016-09-19 DIAGNOSIS — Z7901 Long term (current) use of anticoagulants: Secondary | ICD-10-CM | POA: Diagnosis not present

## 2016-09-19 LAB — POCT INR: INR: 2.3

## 2016-09-19 MED ORDER — ENOXAPARIN SODIUM 80 MG/0.8ML ~~LOC~~ SOLN: 80.0000 mg | Freq: Two times a day (BID) | SUBCUTANEOUS | 0 refills | Status: DC

## 2016-09-19 NOTE — Patient Instructions (Addendum)
June/21: Last dose of Coumadin.  June/22: No Coumadin or Lovenox.  June/23: Inject Lovenox 80mg  in the fatty abdominal tissue at least 2 inches from the belly button twice a day about 12 hours apart, 8am and 8pm rotate sites. No Coumadin.  June/24: Inject Lovenox in the fatty tissue every 12 hours, 8am and 8pm. No Coumadin.  June/25: Inject Lovenox in the fatty tissue every 12 hours, 8am and 8pm. No Coumadin.  June/26: Inject Lovenox in the fatty tissue in the morning at 8 am (No PM dose). No Coumadin.  June/27: Procedure Day - No Lovenox - Resume Coumadin in the evening or as directed by doctor (take an extra half tablet with usual dose for 2 days then resume normal dose).  June/28: Resume Lovenox inject in the fatty tissue every 12 hours and take Coumadin.  June/29: Inject Lovenox in the fatty tissue every 12 hours and take Coumadin.  June/30: Inject Lovenox in the fatty tissue every 12 hours and take Coumadin.  July/1: Inject Lovenox in the fatty tissue every 12 hours and take Coumadin.  July/2: Inject Lovenox in the fatty tissue every 12 hours and take Coumadin.  July/3:Check INR at clinic

## 2016-10-02 ENCOUNTER — Ambulatory Visit (INDEPENDENT_AMBULATORY_CARE_PROVIDER_SITE_OTHER): Payer: BLUE CROSS/BLUE SHIELD | Admitting: Cardiology

## 2016-10-02 ENCOUNTER — Encounter: Payer: Self-pay | Admitting: Cardiology

## 2016-10-02 VITALS — BP 120/88 | HR 88 | Ht 69.0 in | Wt 187.0 lb

## 2016-10-02 DIAGNOSIS — Z8249 Family history of ischemic heart disease and other diseases of the circulatory system: Secondary | ICD-10-CM

## 2016-10-02 DIAGNOSIS — I10 Essential (primary) hypertension: Secondary | ICD-10-CM | POA: Diagnosis not present

## 2016-10-02 DIAGNOSIS — Z7901 Long term (current) use of anticoagulants: Secondary | ICD-10-CM

## 2016-10-02 DIAGNOSIS — E663 Overweight: Secondary | ICD-10-CM

## 2016-10-02 DIAGNOSIS — E785 Hyperlipidemia, unspecified: Secondary | ICD-10-CM | POA: Diagnosis not present

## 2016-10-02 DIAGNOSIS — I823 Embolism and thrombosis of renal vein: Secondary | ICD-10-CM | POA: Diagnosis not present

## 2016-10-02 NOTE — Assessment & Plan Note (Signed)
Stable - regained muscle mass from hospital wgt loss. Exercise - walk.

## 2016-10-02 NOTE — Progress Notes (Signed)
PCP: Susy Frizzle, MD  Clinic Note: Chief Complaint  Patient presents with  . Follow-up    12 months - long-standing antiphospholipid antibody syndrome with history of significant thrombus burden    HPI: Kenneth Cline is a 61 y.o. male with a PMH below who presents today for Delayed annual follow-up - extensive family history for CAD. 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 He also has a history of choledocholithiasis with recurrent cholangitis. - He had ERCP in February 2018  He has been evaluated so far with a treadmill nuclear stress test in 2008 there was no ischemic. He had a CPET-MET test done when I first met him in 2013. That was also relatively normal.   Dravyn Severs was last seen in May 2017 after prolonged hospitalization  Recent Hospitalizations: Dec-Jan @ [facility]; then reversal ERCP @ [facility] in Feb  Studies Personally Reviewed - (if available, images/films reviewed: From Epic Chart or Care Everywhere)  Echo 08/27/2015 [facility]): Normal LV size. Basal septal hypertrophy. EF 57%. Pseudo-normal filling. Mild focal aortic valve thickening. (Study was done to evaluate for possible endocarditis)  Interval History: Finas returns today thankfully overall stable from a cardiac standpoint. He is just a little bit tired and recovered from the episodes happened back in December through February. He is still taking the Zetia and tolerating it fine. Excellent he is due for colonoscopy soon and would need to hold his warfarin. He does have lots of bruising on his arms, but he has had so many different procedures and IVs done that he gets stuck quite a bit - he necessarily bruises because of warfarin. He is very stable from cardiac standpoint with no active symptoms. Review of symptoms includes:   No chest pain or shortness of breath with rest or exertion.  No PND, orthopnea  or edema.  No palpitations, lightheadedness, dizziness, weakness or syncope/near syncope. No TIA/amaurosis fugax symptoms.  No claudication.  ROS: A comprehensive was performed. Review of Systems  Constitutional: Negative for malaise/fatigue.  HENT: Negative for nosebleeds.   Respiratory: Negative for cough, shortness of breath and wheezing.   Cardiovascular: Negative.   Gastrointestinal: Negative for blood in stool.  Genitourinary: Negative for hematuria.  Musculoskeletal: Positive for myalgias (resting leg cramps (worse with dehydration) ).  Neurological: Negative for dizziness.       No residual poststroke symptoms  Psychiatric/Behavioral: Negative for depression (In surprisingly good spirits for his chronic conditions).  All other systems reviewed and are negative.  I have reviewed and (if needed) personally updated the patient's problem list, medications, allergies, past medical and surgical history, social and family history.   Past Medical History:  Diagnosis Date  . Adrenal hemorrhage [facility]) April 2016   Following shoulder surgery  . Adrenal insufficiency, primary, hemorrhagic [facility]) April 2016  . CVA (cerebral infarction) January 2017   3 small areas of infarct in both the cerebrum as well as the cerebellum -- , located with diplopia  . 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 Dyslipidemia   . Erectile dysfunction   . Essential hypertension   . Family history of premature coronary artery disease    2 brothers, one sister and father all with CAD.  One sister with valve disease.  Marland Kitchen GERD (gastroesophageal reflux disease)   . H/O acute cholangitis May 7867   Complicated by septic shock, acute pancreatitis and acute  choledocholithiasis;   . Lupus anticoagulant with hemorrhagic disorder (HCC)    Previously on Xarelto. Now on warfarin  . Overweight (BMI 25.0-29.9)   . Recurrent cholangitis July 2016; October and November 2016, February 2017    Initial ERCP with biliary stent placement followed by cholecystectomy forr cholecystitis  . Septic shock due to Escherichia coli [facility]) October 2016; January 2017   Escherichia coli bacteremia secondary to recurrent cholangitis -- status post ERCP  . Stevens-Johnson disease ([place]) 04/08/2013    Reaction to Biaxin  . 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 Type 2 diabetes mellitus [facility]) February 2017    Past Surgical History:  Procedure Laterality Date  . CPET/MET  11/27/2011   normal PFT, good effort-no ischemia burden  . ERCP W/ METAL STENT PLACEMENT  October 2016; November 2016   [facility] [place] - stent removal  . ERCP W/ METAL STENT PLACEMENT  06/01/2015   [place]. Likely cholangitis  . ERCP W/ PLASTIC STENT PLACEMENT  Sep 09 2014   [facility] [place] - Dr. Alvera Novel  . ERCP W/ SPHINCTEROTOMY AND BALLOON DILATION  11/10/2014   [facility] [place] -- Removal of biliary stent,, to PACU duodenitis  . LAPAROSCOPIC CHOLECYSTECTOMY W/ CHOLANGIOGRAPHY  November 13 2014   [facility] [place], Dr. Devota Pace  . NM MYOCAR PERF WALL MOTION  06/28/2006   EF 45% , LV systolic fx norm.  Marland Kitchen ROTATOR CUFF REPAIR    . TRANSTHORACIC ECHOCARDIOGRAM  04/2015   No pericardial effusion. No shunt. Full study: Basal septal LVH. Pseudo-normal filling (GR 2 DD). EF 57%.  . TRANSTHORACIC ECHOCARDIOGRAM  08/2015   [facility]): Normal LV size. Basal septal hypertrophy. EF 57%. Pseudo-normal filling. Mild focal aortic valve thickening. (Study was done to evaluate for possible endocarditis)  Limited 2-D echo 05/13/2015: No pericardial effusion. No shunt. Full study: Basal septal LVH. Pseudo-normal filling (GR 2 DD). EF 57%.   Current Meds  Medication Sig  . aspirin EC 81 MG tablet Take 81 mg by mouth daily.  . Calcium Carb-Cholecalciferol (CALCIUM 600/VITAMIN D3) 600-800 MG-UNIT TABS Take by mouth.  . cetirizine (ZYRTEC) 10 MG tablet Take 10 mg by mouth daily. Reported on 07/08/2015  . Cholecalciferol (VITAMIN D-1000 MAX  ST) 1000 units tablet Take 1 tablet by mouth daily.  . clonazePAM (KLONOPIN) 0.5 MG tablet Take 0.5 mg by mouth 2 (two) times daily as needed for anxiety.  Marland Kitchen dexamethasone (DECADRON) 1 MG tablet   . diphenhydrAMINE (BENADRYL) 25 MG tablet Take 25 mg by mouth every 6 (six) hours as needed.  . enoxaparin (LOVENOX) 80 MG/0.8ML injection Inject 0.8 mLs (80 mg total) into the skin every 12 (twelve) hours.  Marland Kitchen ezetimibe (ZETIA) 10 MG tablet TAKE 1 TABLET (10 MG TOTAL) BY MOUTH DAILY. NEED OV.  . ferrous sulfate (FERROUSUL) 325 (65 FE) MG tablet Take 325 mg by mouth daily.  . hydrocortisone sodium succinate (SOLU-CORTEF) 100 MG SOLR injection Inject 100 mg into the vein.  Marland Kitchen levothyroxine (SYNTHROID, LEVOTHROID) 25 MCG tablet Take 1 tablet (25 mcg total) by mouth daily before breakfast.  . methocarbamol (ROBAXIN) 500 MG tablet Take 500 mg by mouth.  . pantoprazole (PROTONIX) 40 MG tablet Take 1 tablet (40 mg total) by mouth daily.  . predniSONE (DELTASONE) 5 MG tablet Take 7.5 mg by mouth.  . Probiotic Product (PROBIOTIC DAILY PO) Take by mouth.  . sitaGLIPtin (JANUVIA) 100 MG tablet Take 100 mg by mouth daily.  . traMADol (ULTRAM) 50 MG tablet Take  1 tablet (50 mg total) by mouth every 12 (twelve) hours as needed for moderate pain.  Marland Kitchen warfarin (COUMADIN) 5 MG tablet TAKE 1 TO 1 AND 1/2 TABLETS BY MOUTH DAILY AS DIRECTED BY COUMADIN CLINIC    Allergies  Allergen Reactions  . Penicillins     REACTION: rash  . Biaxin [Clarithromycin] Rash  . Keflex [Cephalexin] Rash    Social History   Social History  . Marital status: Married    Spouse name: [name]  . Number of children: 2  . Years of education: [name]   Occupational History  .  U[employer]   Social History Main Topics  . Smoking status: Never Smoker  . Smokeless tobacco: Never Used  . Alcohol use 1.5 oz/week    3 Standard drinks or equivalent per week     Comment: occassional  . Drug use: No  . Sexual activity: Not Asked   Other Topics Concern   . None   Social History Narrative   He is a married father of 2 with 3 stepchildren.  He has 3 grandchildren his own and 3 step grandchildren.  He works as a Dealer for [employer].  He lives with his wife, [name], of 8 years.  He does not, and never did smoke.  He takes occasional alcohol beverage but nothing significant.  He does not do routine exercise, but does walk a lot at work.    family history includes Heart attack (age of onset: 65) in his brother; Heart attack (age of onset: 70) in his father; Heart disease (age of onset: 52) in his sister; Heart disease (age of onset: 58) in his brother; Heart disease (age of onset: 18) in his sister; Heart failure in his sister; Hypertension in his brother, brother, sister, and sister; Ovarian cancer in his maternal grandmother; Sudden death in his brother; Sudden death (age of onset: 97) in his father.   Brother: CABG at age 61, now 71.   Brother (twin sister with CABG): Died following MI with cardiac arrest at age 49   Sister: CABG in her 72s. Diet CHF after bowel surgery.   Second Sister: History of arrhythmias and valve disease   Paternal grandmother died a couple cases of childbirth related CHF (peripartum cardiomyopathy)  Wt Readings from Last 3 Encounters:  10/02/16 187 lb (84.8 kg)  09/21/15 171 lb 12.8 oz (77.9 kg)  09/03/14 182 lb 3.2 oz (82.6 kg)    PHYSICAL EXAM BP 120/88   Pulse 88   Ht 5' 9"  (1.753 m)   Wt 187 lb (84.8 kg)   BMI 27.62 kg/m  General appearance: alert, cooperative, appears stated age, no distress.  Surprisingly healthy-appearing. Well-nourished and well-groomed. HEENT: [place]/AT, EOMI, MMM, anicteric sclera Neck: no adenopathy, no carotid bruit and no JVD Lungs: clear to auscultation bilaterally, normal percussion bilaterally and non-labored Heart: regular rate and rhythm, S1 &S2 normal, no murmur, click, rub or gallop; nondisplaced PMI. Abdomen: soft, non-tender; bowel sounds normal; no masses,  no  organomegaly; no HJR Extremities: extremities normal, atraumatic, no cyanosis, or edema Pulses: 2+ and symmetric;  Neurologic: Mental status: Alert & oriented x 3, thought content appropriate; non-focal exam.  Pleasant mood & affect.    Adult ECG Report  Rate: 88 ;  Rhythm: normal sinus rhythm and Normal axis, intervals and durations;   Narrative Interpretation: Normal EKG   Other studies Reviewed: Additional studies/ records that were reviewed today include:  Recent Labs:  No recent labs    ASSESSMENT / PLAN:  Problem List Items Addressed This Visit    Dyslipidemia - Primary (Chronic)    I haven't seen any recent labs. He is not sure when last on his PCP has checked them.   Plan: Check fasting lipid panel       Relevant Orders   EKG 12-Lead (Completed)   Lipid panel   Family history of premature coronary artery disease (Chronic)   Relevant Orders   EKG 12-Lead (Completed)   Hypertension, essential, benign (Chronic)    Totally well-controlled now. He is actually not on his ACE inhibitor/HCTZ combination anymore. In fact he is not on any medications. Hypertension symptoms were stopped during his bouts with cholangitis      Relevant Orders   EKG 12-Lead (Completed)   Long term (current) use of anticoagulants (Chronic)    Remains on warfarin for long-standing clot issues. Labs monitored here in our office.      Overweight (BMI 25.0-29.9) (Chronic)    Stable - regained muscle mass from hospital wgt loss. Exercise - walk.      Relevant Orders   Lipid panel   Renal vein thrombosis (HCC) (Chronic)    Is a chronic condition probably related to his cholangitis. Has been maintained on warfarin for years without any significant bleeding. Would simply need to hold warfarin for any necessary procedures.      Relevant Orders   EKG 12-Lead (Completed)   Lipid panel      Current medicines are reviewed at length with the patient today. (+/- concerns) None The following  changes have been made: None  Patient Instructions  LABS   LIPID  NOTHING TO EAT ORDER DRINK THE MORNING OF THE TEST     NO MEDICATION CHANGE     Your physician wants you to follow-up in 12 MONTHS WITH DR Holliday Sheaffer.You will receive a reminder letter in the mail two months in advance. If you don't receive a letter, please call our office to schedule the follow-up appointment.   If you need a refill on your cardiac medications before your next appointment, please call your pharmacy.     Studies Ordered:   Orders Placed This Encounter  Procedures  . Lipid panel  . EKG 12-Lead      Glenetta Hew, M.D., M.S. Interventional Cardiologist   Pager # [phone] Phone # [phone] [address] [place], [place] [address]

## 2016-10-02 NOTE — Patient Instructions (Signed)
LABS   LIPID  NOTHING TO EAT ORDER DRINK THE MORNING OF THE TEST     NO MEDICATION CHANGE     Your physician wants you to follow-up in 12 MONTHS WITH DR HARDING.You will receive a reminder letter in the mail two months in advance. If you don't receive a letter, please call our office to schedule the follow-up appointment.   If you need a refill on your cardiac medications before your next appointment, please call your pharmacy.

## 2016-10-04 ENCOUNTER — Encounter: Payer: Self-pay | Admitting: Cardiology

## 2016-10-04 NOTE — Assessment & Plan Note (Signed)
Totally well-controlled now. He is actually not on his ACE inhibitor/HCTZ combination anymore. In fact he is not on any medications. Hypertension symptoms were stopped during his bouts with cholangitis

## 2016-10-04 NOTE — Assessment & Plan Note (Signed)
Is a chronic condition probably related to his cholangitis. Has been maintained on warfarin for years without any significant bleeding. Would simply need to hold warfarin for any necessary procedures.

## 2016-10-04 NOTE — Assessment & Plan Note (Signed)
I haven't seen any recent labs. He is not sure when last on his PCP has checked them.   Plan: Check fasting lipid panel

## 2016-10-04 NOTE — Assessment & Plan Note (Signed)
Remains on warfarin for long-standing clot issues. Labs monitored here in our office.

## 2016-10-24 ENCOUNTER — Ambulatory Visit (INDEPENDENT_AMBULATORY_CARE_PROVIDER_SITE_OTHER): Payer: BLUE CROSS/BLUE SHIELD | Admitting: Pharmacist

## 2016-10-24 DIAGNOSIS — Z7901 Long term (current) use of anticoagulants: Secondary | ICD-10-CM

## 2016-10-24 DIAGNOSIS — I823 Embolism and thrombosis of renal vein: Secondary | ICD-10-CM | POA: Diagnosis not present

## 2016-10-24 LAB — POCT INR: INR: 1.7

## 2016-11-01 ENCOUNTER — Ambulatory Visit (INDEPENDENT_AMBULATORY_CARE_PROVIDER_SITE_OTHER): Payer: BLUE CROSS/BLUE SHIELD | Admitting: Pharmacist

## 2016-11-01 DIAGNOSIS — I823 Embolism and thrombosis of renal vein: Secondary | ICD-10-CM | POA: Diagnosis not present

## 2016-11-01 DIAGNOSIS — Z7901 Long term (current) use of anticoagulants: Secondary | ICD-10-CM | POA: Diagnosis not present

## 2016-11-01 LAB — POCT INR: INR: 2

## 2016-11-10 ENCOUNTER — Ambulatory Visit (INDEPENDENT_AMBULATORY_CARE_PROVIDER_SITE_OTHER): Payer: BLUE CROSS/BLUE SHIELD | Admitting: Family Medicine

## 2016-11-10 ENCOUNTER — Encounter: Payer: Self-pay | Admitting: Family Medicine

## 2016-11-10 ENCOUNTER — Other Ambulatory Visit: Payer: Self-pay | Admitting: Family Medicine

## 2016-11-10 VITALS — BP 142/100 | HR 72 | Temp 98.0°F | Resp 14 | Ht 69.0 in | Wt 186.0 lb

## 2016-11-10 DIAGNOSIS — L84 Corns and callosities: Secondary | ICD-10-CM | POA: Diagnosis not present

## 2016-11-10 NOTE — Progress Notes (Signed)
Subjective:    Patient ID: Kenneth Cline, male    DOB: [birth year], 61 y.o.   MRN: [id number]  HPI  Patient presents today with pain in the webspace between his fifth toe and his fourth toe. On examination there, there are 2 3-4 mm patches that appear to be very soft corns.  There are no dermal ridges but they are tender to touch. There are no capillary hemorrhages. There appear to be due to friction. He is here today to discuss treatment options. The location makes freezing or excision difficult. He has a very complicated past medical history. He has a history of lupus anticoagulant. 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  He is on chronic fludrocortisone as well as prednisone for this. He has a history of type 2 diabetes as well as hypothyroidism which is treated by endocrinology. He also has a history of acute cholangitis requiring ERCP and bile duct stenting. Past Medical History:  Diagnosis Date  . Adrenal hemorrhage [facility]) April 2016   Following shoulder surgery  . Adrenal insufficiency, primary, hemorrhagic [facility]) April 2016  . CVA (cerebral infarction) January 2017   3 small areas of infarct in both the cerebrum as well as the cerebellum -- , located with diplopia  . 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 Dyslipidemia   . Erectile dysfunction   . Essential hypertension   . Family history of premature coronary artery disease    2 brothers, one sister and father all with CAD.  One sister with valve disease.  Marland Kitchen GERD (gastroesophageal reflux disease)   . H/O acute cholangitis May 6761   Complicated by septic shock, acute pancreatitis and acute choledocholithiasis;   . Lupus anticoagulant with hemorrhagic disorder (HCC)    Previously on Xarelto. Now on warfarin  . Overweight (BMI 25.0-29.9)   . Recurrent cholangitis July 2016; October and November 2016,  February 2017   Initial ERCP with biliary stent placement followed by cholecystectomy forr cholecystitis  . Septic shock due to Escherichia coli [facility]) October 2016; January 2017   Escherichia coli bacteremia secondary to recurrent cholangitis -- status post ERCP  . Stevens-Johnson disease ([place]) 04/08/2013    Reaction to Biaxin  . Thrombosis of left renal vein [facility]) May 2016  . Type 2 diabetes mellitus [facility]) February 2017   Past Surgical History:  Procedure Laterality Date  . CPET/MET  11/27/2011   normal PFT, good effort-no ischemia burden  . ERCP W/ METAL STENT PLACEMENT  October 2016; November 2016   [facility] [place] - stent removal  . ERCP W/ METAL STENT PLACEMENT  06/01/2015   [place]. Likely cholangitis  . ERCP W/ PLASTIC STENT PLACEMENT  Sep 09 2014   [facility] [place] - Dr. Alvera Novel  . ERCP W/ SPHINCTEROTOMY AND BALLOON DILATION  11/10/2014   [facility] [place] -- Removal of biliary stent,, to PACU duodenitis  . LAPAROSCOPIC CHOLECYSTECTOMY W/ CHOLANGIOGRAPHY  November 13 2014   [facility] [place], Dr. Devota Pace  . NM MYOCAR PERF WALL MOTION  06/28/2006   EF 95% , LV systolic fx norm.  Marland Kitchen ROTATOR CUFF REPAIR    . TRANSTHORACIC ECHOCARDIOGRAM  04/2015   No pericardial effusion. No shunt. Full study: Basal septal LVH. Pseudo-normal filling (GR 2 DD). EF 57%.  . TRANSTHORACIC ECHOCARDIOGRAM  08/2015   [facility]): Normal LV size. Basal septal hypertrophy. EF 57%. Pseudo-normal  filling. Mild focal aortic valve thickening. (Study was done to evaluate for possible endocarditis)   Current Outpatient Prescriptions on File Prior to Visit  Medication Sig Dispense Refill  . enoxaparin (LOVENOX) 80 MG/0.8ML injection Inject 0.8 mLs (80 mg total) into the skin every 12 (twelve) hours. 20 Syringe 0  . pantoprazole (PROTONIX) 40 MG tablet Take 1 tablet (40 mg total) by mouth daily. 90 tablet 3  . Probiotic Product (PROBIOTIC DAILY PO) Take by mouth.    . sitaGLIPtin (JANUVIA) 100 MG tablet Take 100 mg  by mouth daily.    . traMADol (ULTRAM) 50 MG tablet Take 1 tablet (50 mg total) by mouth every 12 (twelve) hours as needed for moderate pain. 30 tablet 0  . warfarin (COUMADIN) 5 MG tablet TAKE 1 TO 1 AND 1/2 TABLETS BY MOUTH DAILY AS DIRECTED BY COUMADIN CLINIC 45 tablet 3   No current facility-administered medications on file prior to visit.    Allergies  Allergen Reactions  . Biaxin [Clarithromycin] Rash  . Keflex [Cephalexin] Hives, Itching and Rash  . Erythromycin     Was told not to take d/t medical problems  . Penicillins     REACTION: rash   Social History   Social History  . Marital status: Married    Spouse name: [name]  . Number of children: 2  . Years of education: [name]   Occupational History  .  [employer]   Social History Main Topics  . Smoking status: Never Smoker  . Smokeless tobacco: Never Used  . Alcohol use 1.5 oz/week    3 Standard drinks or equivalent per week     Comment: occassional  . Drug use: No  . Sexual activity: Not on file   Other Topics Concern  . Not on file   Social History Narrative   He is a married father of 2 with 3 stepchildren.  He has 3 grandchildren his own and 3 step grandchildren.  He works as a Dealer for [employer].  He lives with his wife, [name], of 8 years.  He does not, and never did smoke.  He takes occasional alcohol beverage but nothing significant.  He does not do routine exercise, but does walk a lot at work.     Review of Systems  All other systems reviewed and are negative.      Objective:   Physical Exam  Constitutional: He appears well-developed and well-nourished.  Cardiovascular: Normal rate, regular rhythm and normal heart sounds.   Pulmonary/Chest: Effort normal and breath sounds normal. No respiratory distress. He has no wheezes.  Musculoskeletal:       Feet:  Vitals reviewed.  See hpi       Assessment & Plan:  Soft corn  I believe the patient has 2 small corns versus vs warts in the webspace between the  fourth and fifth toes on his left foot. I recommended over-the-counter topical salicylic acid/Compound W be applied every night until resolved. If persistent, could refer to podiatry for excision. I do not believe cryotherapy would work well in this area given its location.

## 2016-12-11 ENCOUNTER — Other Ambulatory Visit: Payer: Self-pay | Admitting: Family Medicine

## 2016-12-11 ENCOUNTER — Ambulatory Visit (INDEPENDENT_AMBULATORY_CARE_PROVIDER_SITE_OTHER): Payer: BLUE CROSS/BLUE SHIELD | Admitting: Pharmacist

## 2016-12-11 DIAGNOSIS — I823 Embolism and thrombosis of renal vein: Secondary | ICD-10-CM

## 2016-12-11 DIAGNOSIS — Z7901 Long term (current) use of anticoagulants: Secondary | ICD-10-CM | POA: Diagnosis not present

## 2016-12-11 LAB — POCT INR: INR: 2.8

## 2016-12-13 ENCOUNTER — Other Ambulatory Visit: Payer: Self-pay | Admitting: Family Medicine

## 2016-12-29 IMAGING — CR DG CHEST 2V
2 series · 2 of 2 positions shown · non-contrast
Comparison: 05/08/2013

CLINICAL DATA: Left chest pain. Shortness of breath. Symptoms began
today. Recent rotator cuff surgery.

EXAM:
CHEST  2 VIEW

[chest pa]
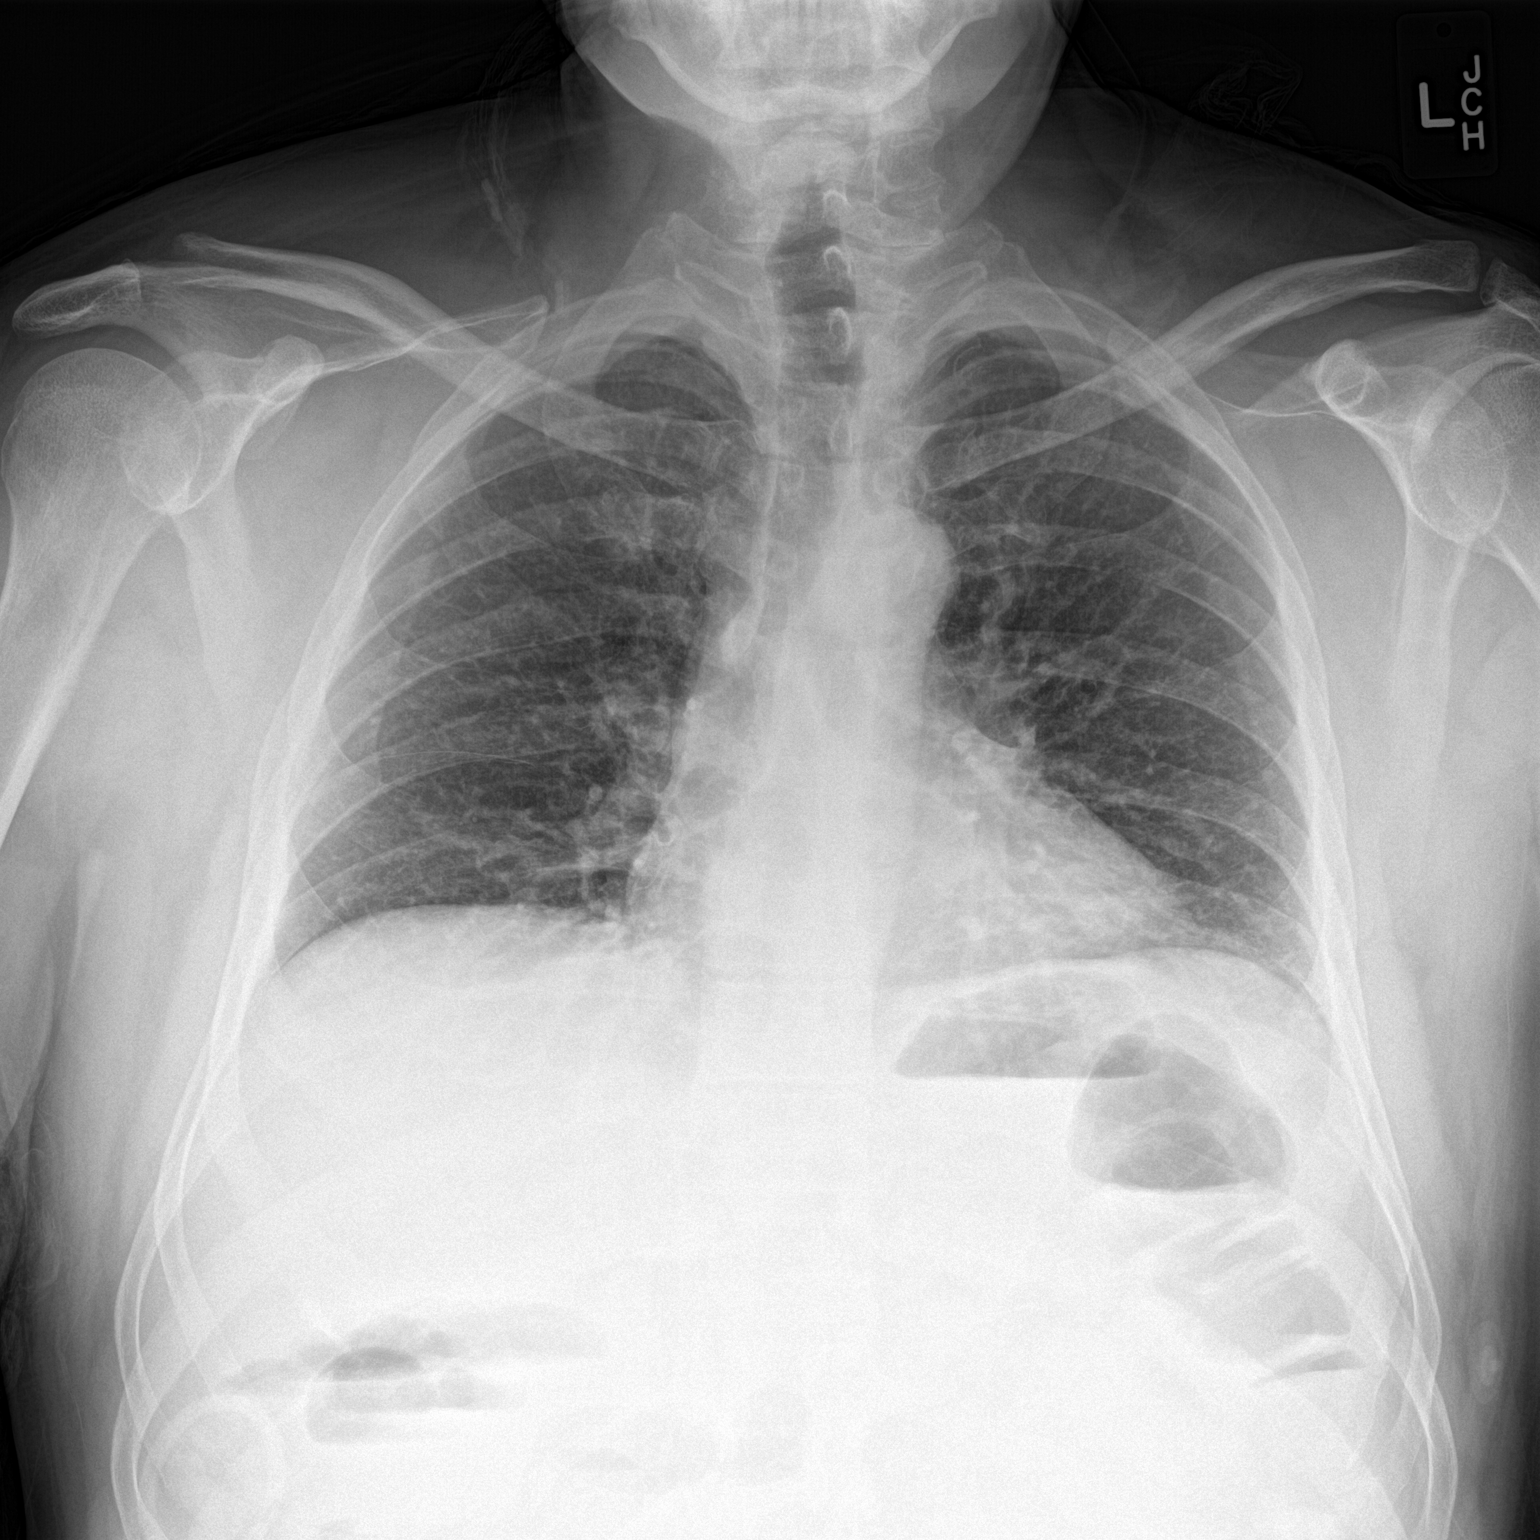

[chest lat]
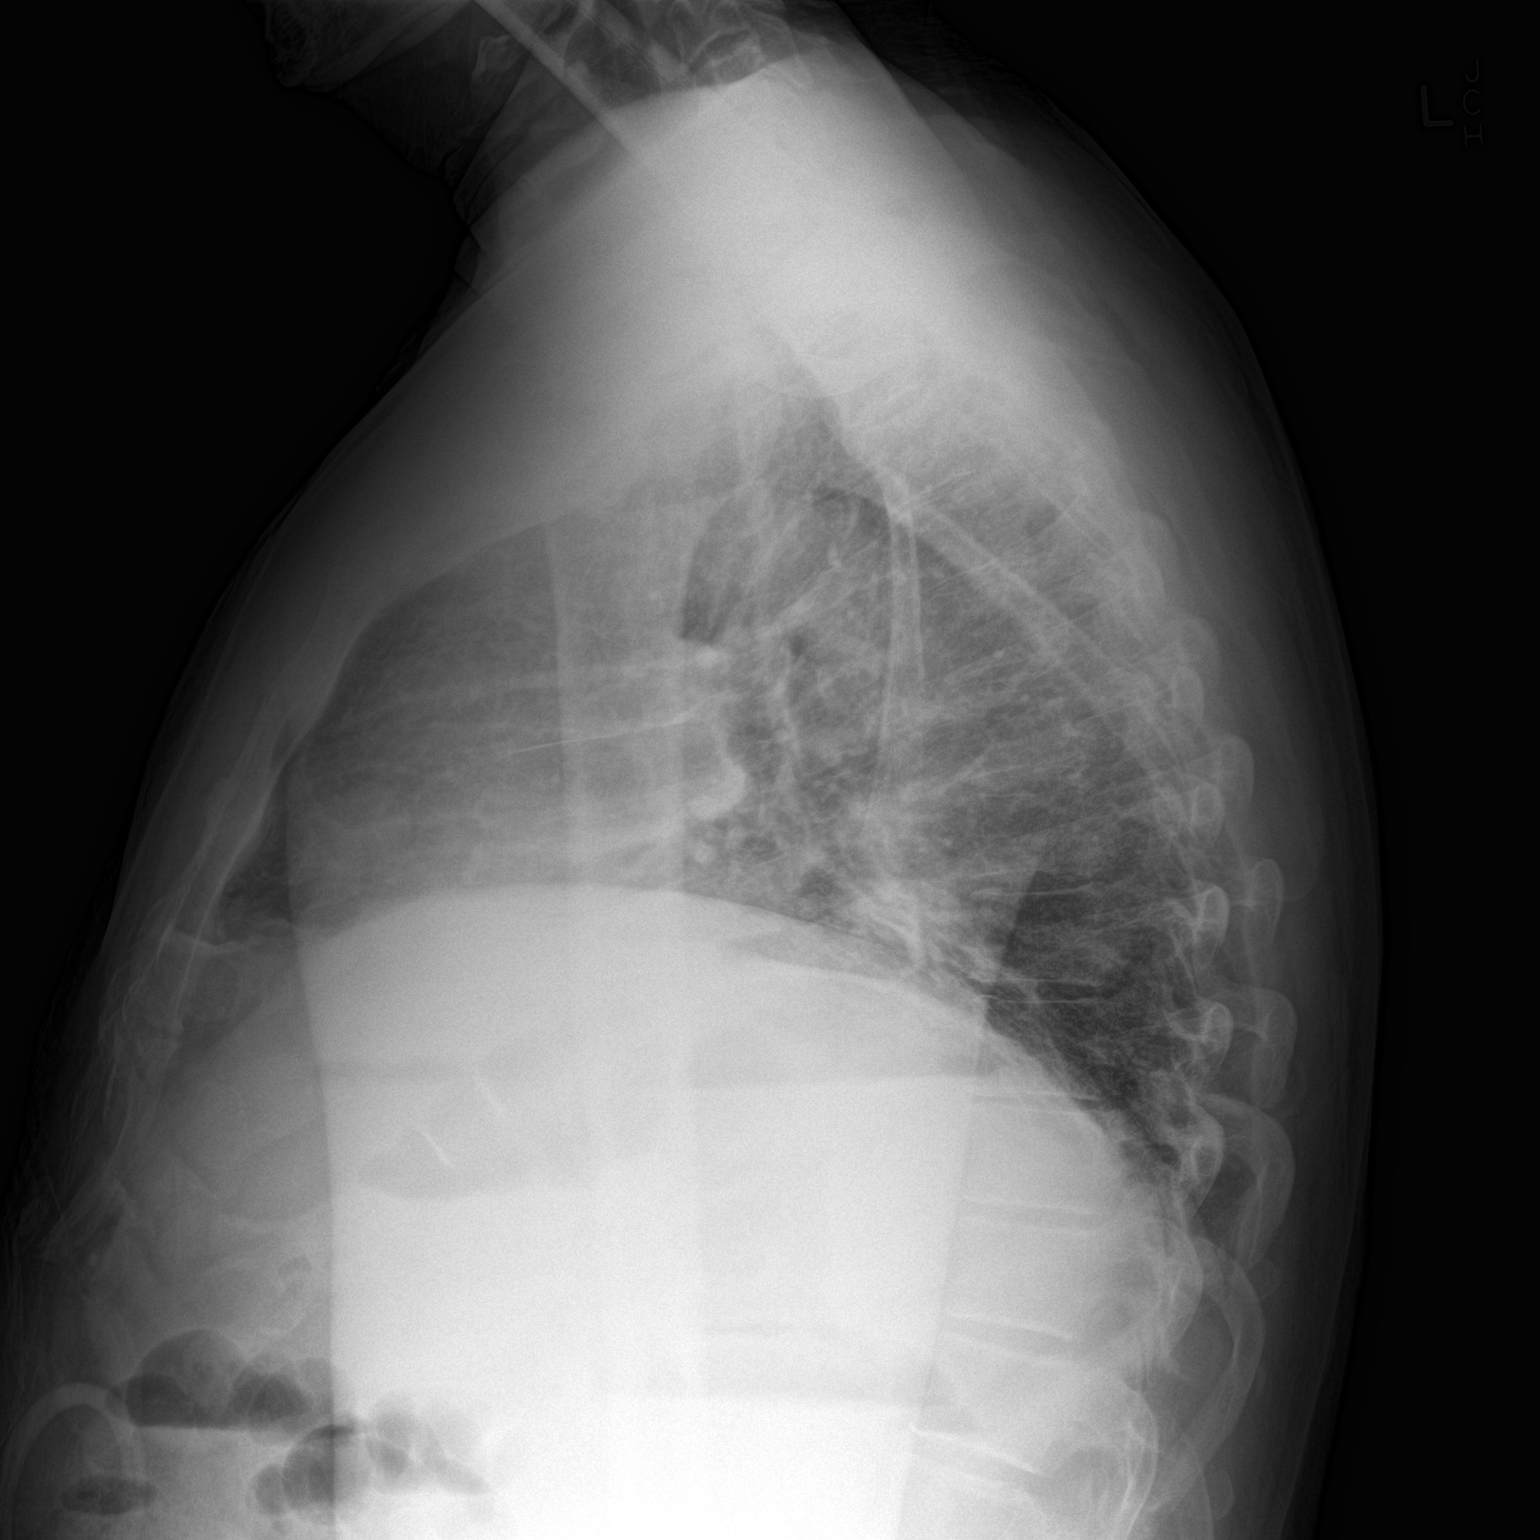

[2 of 2 positions shown; findings below may reference images not displayed]

FINDINGS: Heart size is normal. No mediastinal abnormality. There is volume
loss in both lower lobes consistent with atelectasis and possibly
pneumonia. No dense consolidation or lobar collapse. No effusions.
No acute bony finding.
IMPRESSION: Atelectasis or infiltrate at both lung bases.

## 2016-12-30 IMAGING — CT CT ABD-PELV W/ CM
2 of 5 series · 15 of 46 positions shown, 17 images · IV contrast (Omni 300)
Comparison: None.

CLINICAL DATA: Initial evaluation for lower back pain radiating to
abdomen. Also with nausea, constipation.

EXAM:
CT ABDOMEN AND PELVIS WITH CONTRAST
TECHNIQUE: Multidetector CT imaging of the abdomen and pelvis was performed
using the standard protocol following bolus administration of
intravenous contrast.
CONTRAST:  25mL OMNIPAQUE IOHEXOL 300 MG/ML SOLN, 100mL OMNIPAQUE
IOHEXOL 300 MG/ML SOLN

[Series 2: abd/ pelvis 5.0 i30f 1 · axial · 0.90mm/px · z∈[-450,+30]mm · 12 of 108 slices shown, 14 images]
[im 6/108  soft-tissue]
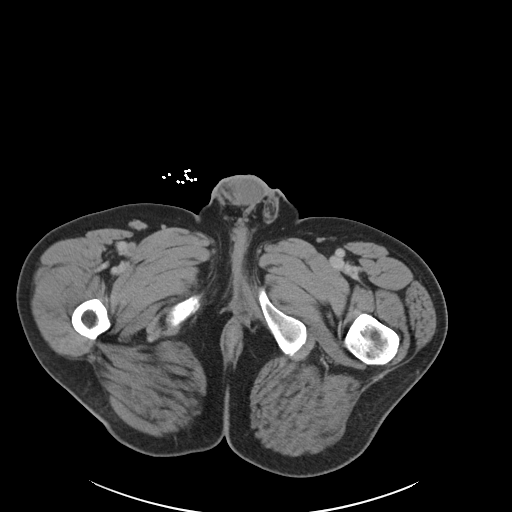
[im 6/108  bone]
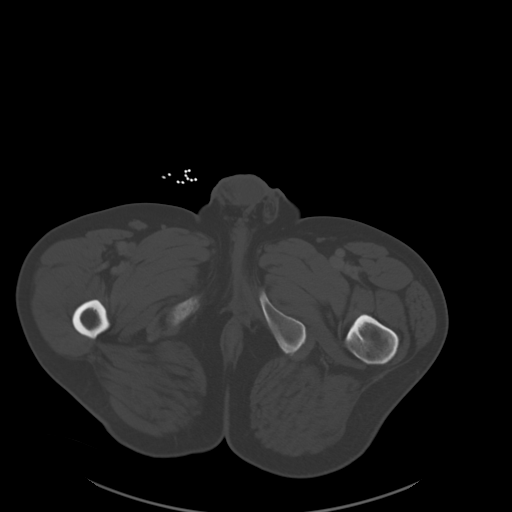
[im 18/108  soft-tissue]
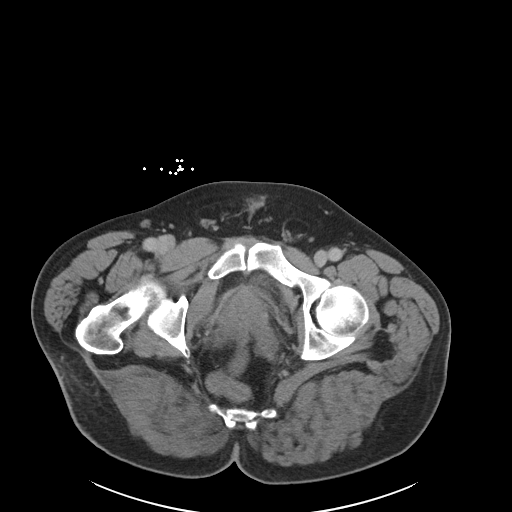
[im 24/108  soft-tissue]
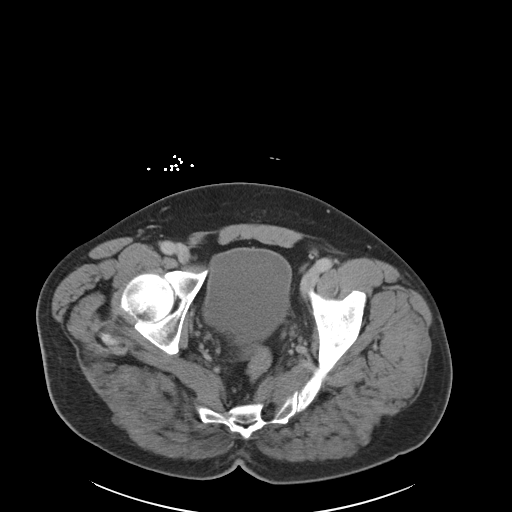
[im 30/108  soft-tissue]
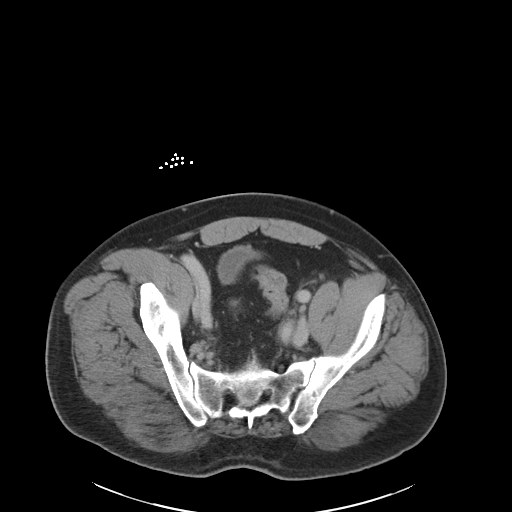
[im 42/108  soft-tissue]
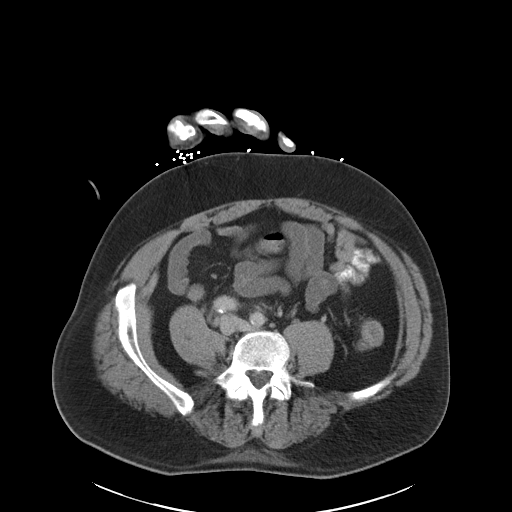
[im 48/108  soft-tissue]
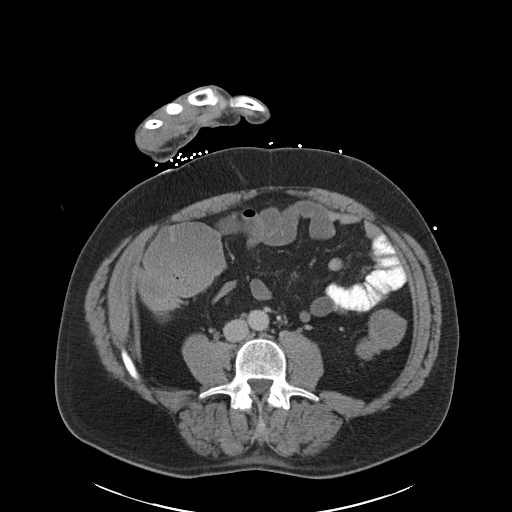
[im 60/108  soft-tissue]
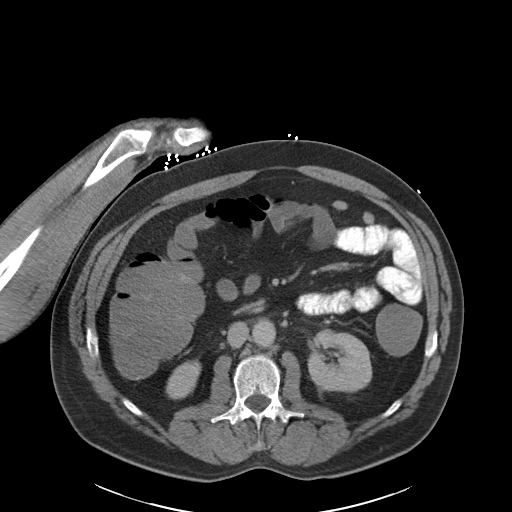
[im 66/108  soft-tissue]
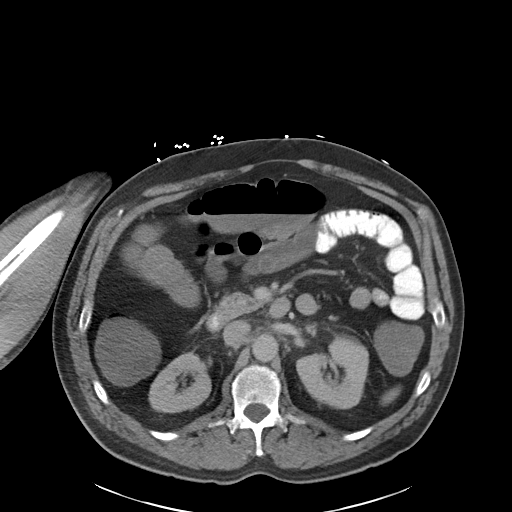
[im 78/108  soft-tissue]
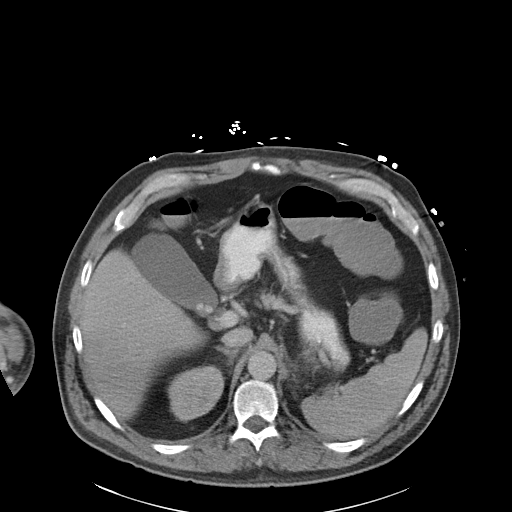
[im 78/108  bone]
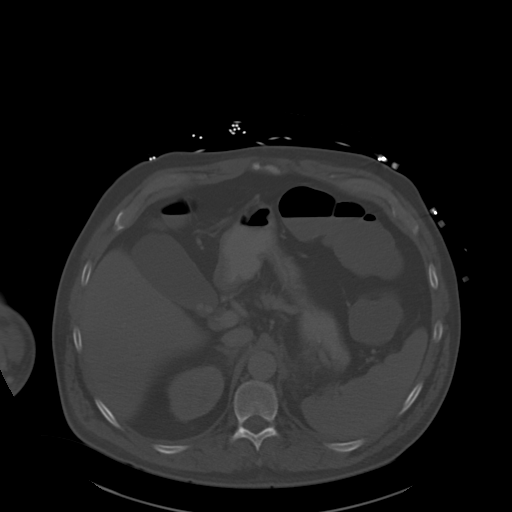
[im 84/108  soft-tissue]
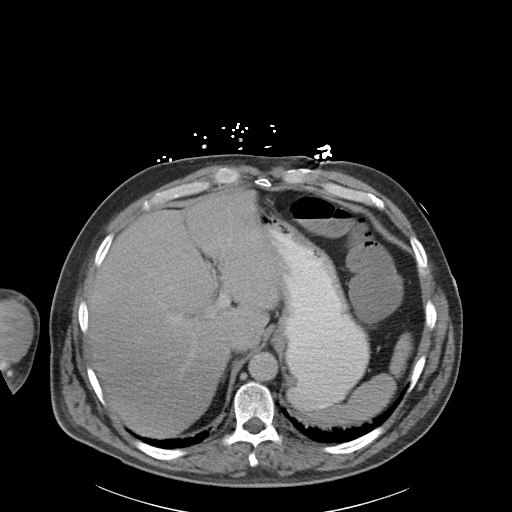
[im 90/108  soft-tissue]
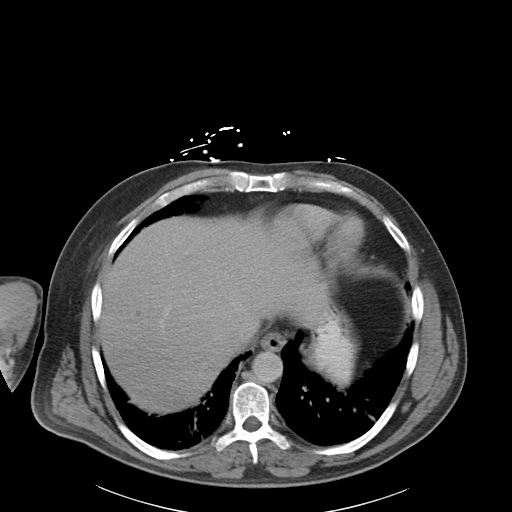
[im 102/108  soft-tissue]
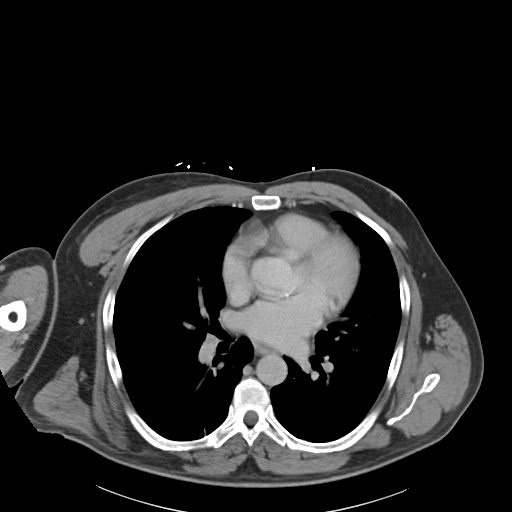

[Series 6: coronals · coronal · 0.77mm/px · 3 of 161 slices shown]
[im 54/161  soft-tissue]
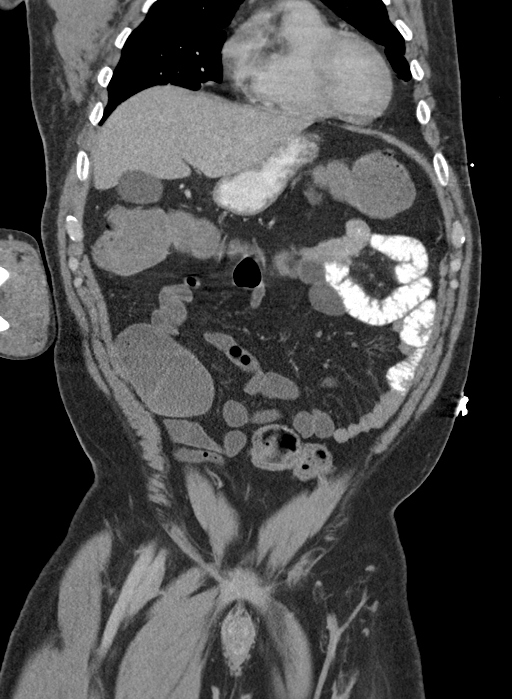
[im 72/161  soft-tissue]
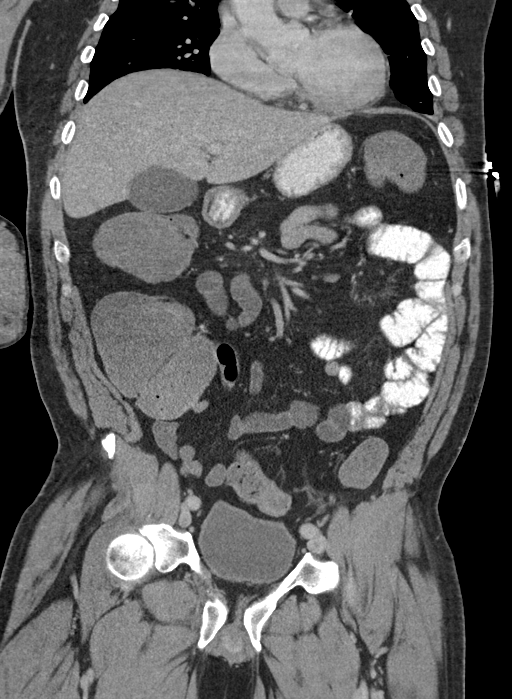
[im 89/161  soft-tissue]
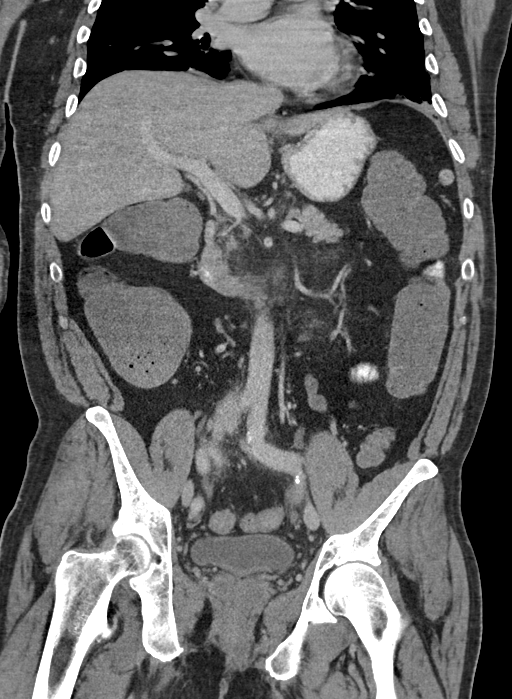

[15 of 46 positions shown; findings below may reference images not displayed]

FINDINGS: Bibasilar linear opacities within the visualized lung bases are most
compatible with atelectasis. Visualized lungs are otherwise clear.
No pleural or pericardial fusion.

Subcentimeter hypodensity within the right hepatic lobe noted, too
small the characterize, but may reflect a small cyst. Liver is
otherwise unremarkable. Few hyperdense stones present within the
gallbladder lumen. No evidence for acute cholecystitis. No biliary
dilatation. Spleen, right adrenal gland, and pancreas demonstrate a
normal contrast enhanced appearance.

There is globular soft tissue density involving the left adrenal
gland measuring approximately 2.6 x 3.2 x 3.0 cm. There is adjacent
inflammatory stranding.

Kidneys are equal in size with symmetric enhancement. Subcentimeter
hypodensity noted within the interpolar right kidney, too small the
characterize, but statistically likely a small cyst. No
nephrolithiasis, hydronephrosis, or focal enhancing renal mass.

Stomach within normal limits. No evidence for bowel obstruction.
Appendix is normal. Scattered fluid density present throughout most
of the colon, suggesting possible diarrheal illness. No abnormal
wall thickening, mucosal enhancement, or inflammatory fat stranding
seen about the bowels.

Bladder within normal limits.  Prostate unremarkable.

No free air or fluid. No pathologically enlarged intra-abdominal
pelvic lymph nodes identified. Normal intravascular enhancement seen
throughout the intra-abdominal aorta and its branch vessels. Mild
stranding present about the bilateral iliac vessels, of uncertain
significance.

No acute osseous abnormality. No worrisome lytic or blastic osseous
lesions.
IMPRESSION: 1. Enlargement of the left adrenal gland, measuring 2.6 x 3.2 x
cm with adjacent inflammatory stranding. While the unilateral nature
of this finding is somewhat unusual, this is favored to reflect an
acute adrenal hemorrhage. This may be related to recent
surgery/stress or possibly coagulopathy. An underlying mass lesion
that has bled is not excluded. A short interval follow-up study in 1
month is recommended to ensure resolution of these findings. This
could be performed with either an adrenal mass protocol CT or MRI.
2. Inflammatory soft tissue stranding about the bilateral internal
iliac arteries in the lower pelvis. Finding is of uncertain
etiology, and may reflect small amount of blood products tracking
inferiorly from the hemorrhage in the left adrenal gland.
Alternatively, possible vasculitis could also be considered.
Attention at follow-up recommended.
3. Intraluminal fluid density within the colonic lumen, suggesting
possible diarrheal illness.

## 2016-12-31 IMAGING — CT CT ABDOMEN WO/W CM
2 of 10 series · 11 of 46 positions shown, 18 images · IV contrast (omnipaque)
Comparison: 08/11/2014

CLINICAL DATA: Left adrenal mass, evaluate for continued hemorrhage

EXAM:
CT ABDOMEN WITHOUT AND WITH CONTRAST
TECHNIQUE: Multidetector CT imaging of the abdomen was performed following the
standard protocol before and following the bolus administration of
intravenous contrast.
CONTRAST:  100mL OMNIPAQUE IOHEXOL 300 MG/ML  SOLN

[Series 2: abdomen 3.0 i30f 1 · axial · 0.80mm/px · z∈[-266,+1]mm · 9 of 109 slices shown, 15 images]
[im 10/109  soft-tissue]
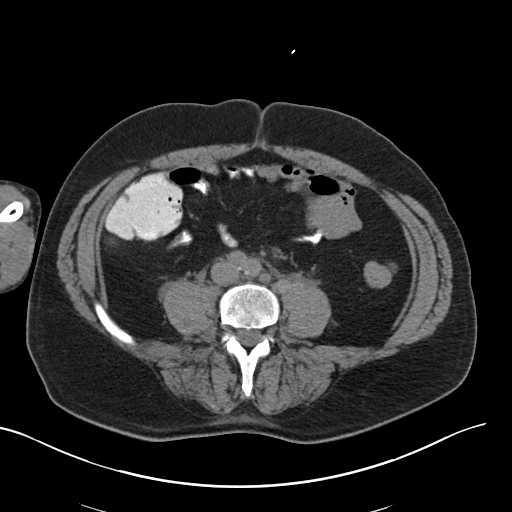
[im 10/109  bone]
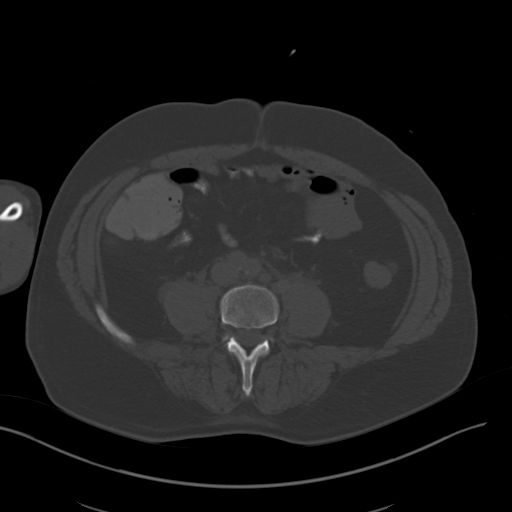
[im 20/109  soft-tissue]
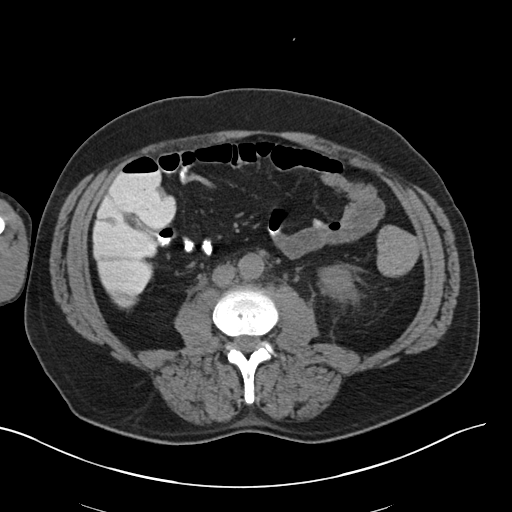
[im 30/109  soft-tissue]
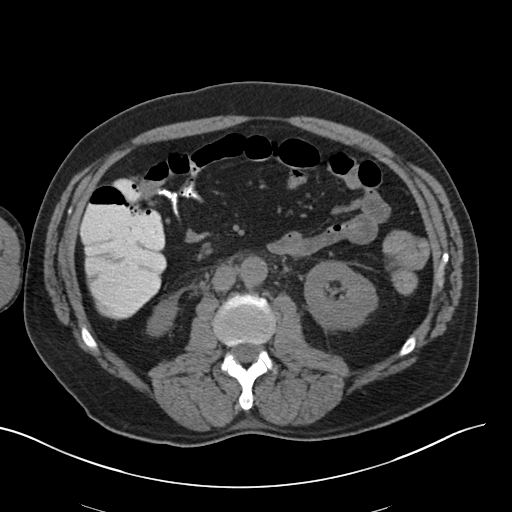
[im 40/109  soft-tissue]
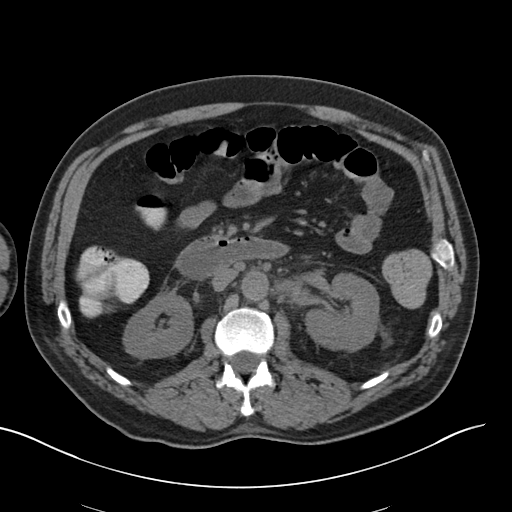
[im 59/109  soft-tissue]
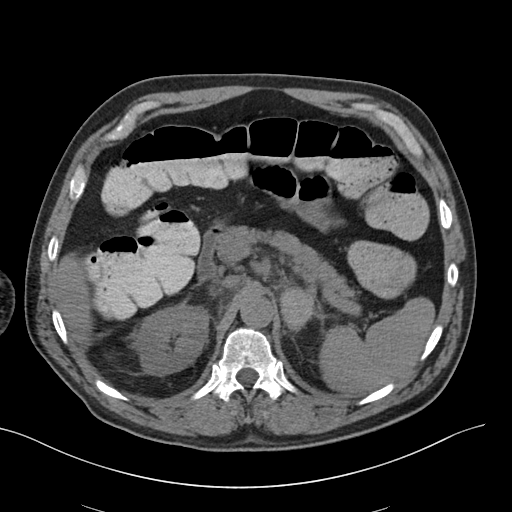
[im 69/109  soft-tissue]
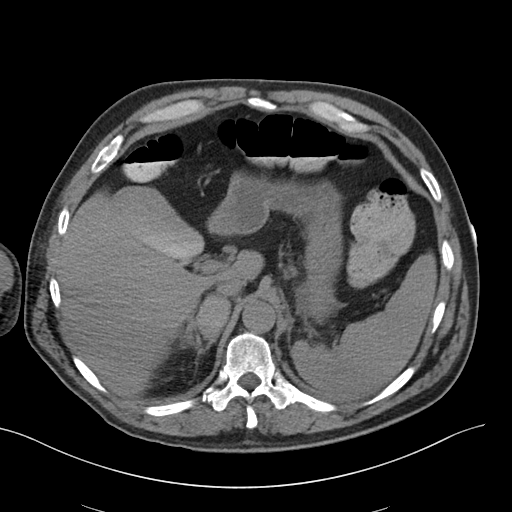
[im 69/109  lung]
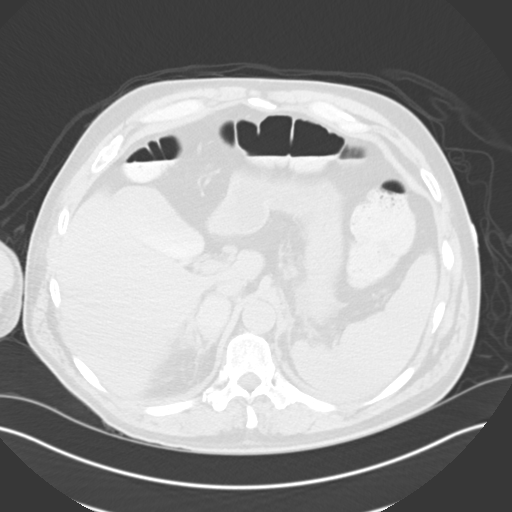
[im 79/109  soft-tissue]
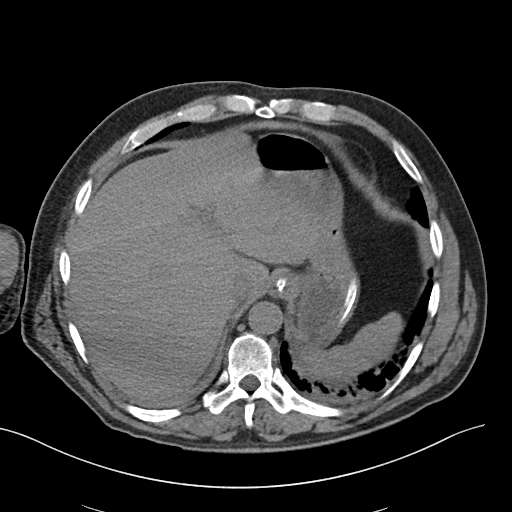
[im 79/109  lung]
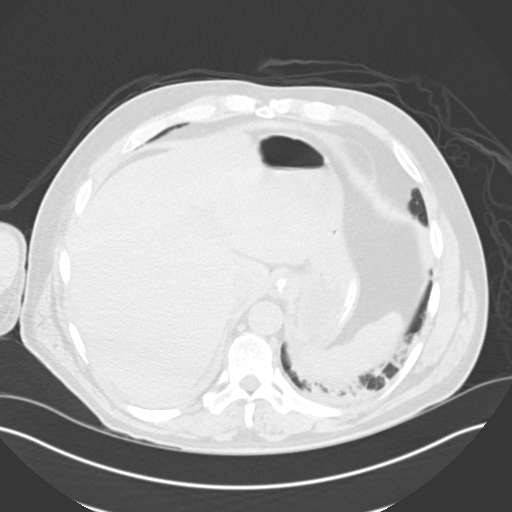
[im 89/109  soft-tissue]
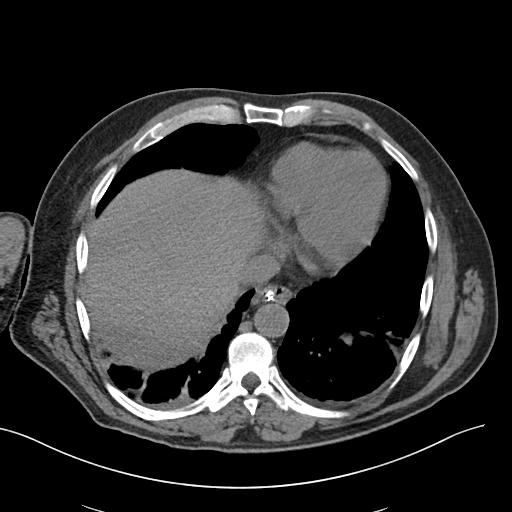
[im 89/109  lung]
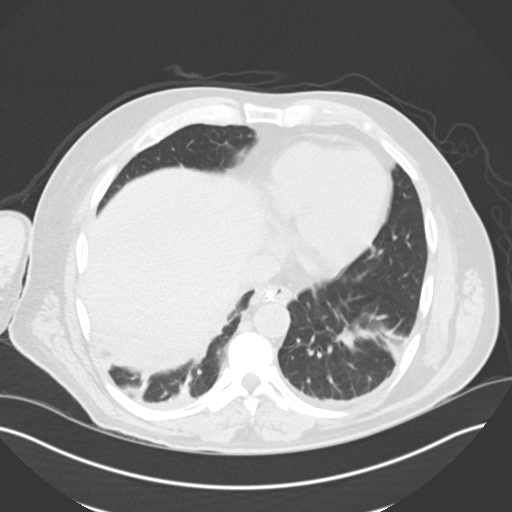
[im 99/109  soft-tissue]
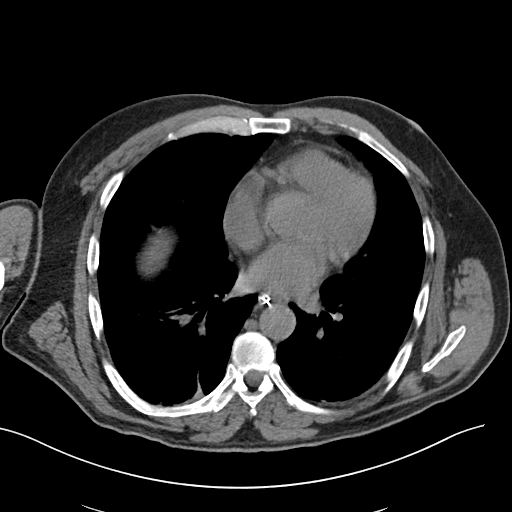
[im 99/109  lung]
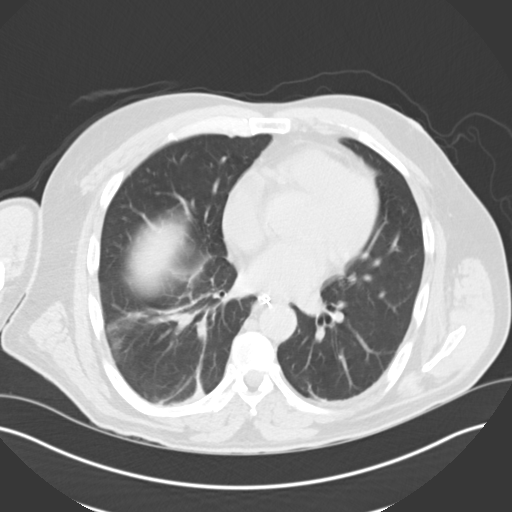
[im 99/109  bone]
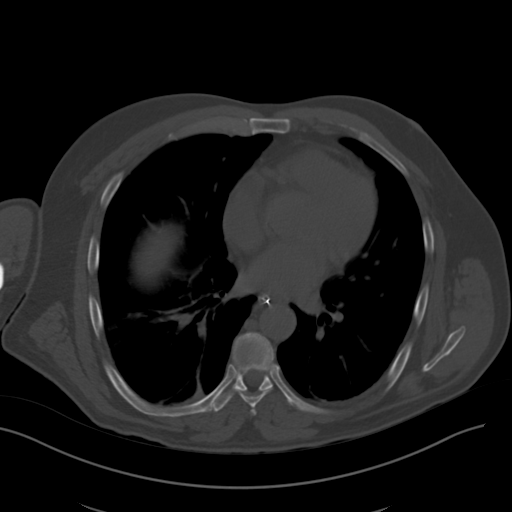

[Series 10: coronals · coronal · 0.70mm/px · 2 of 147 slices shown, 3 images]
[im 49/147  soft-tissue]
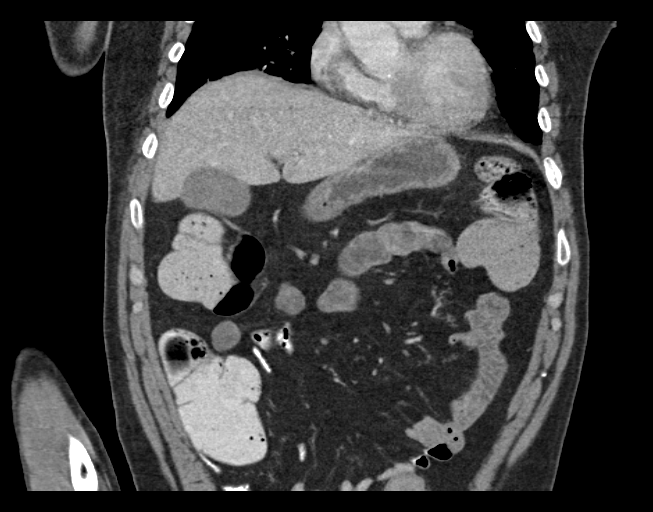
[im 49/147  bone]
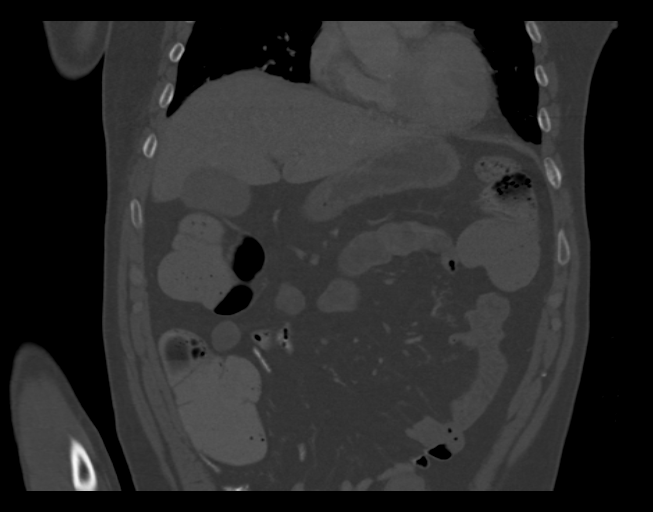
[im 98/147  soft-tissue]
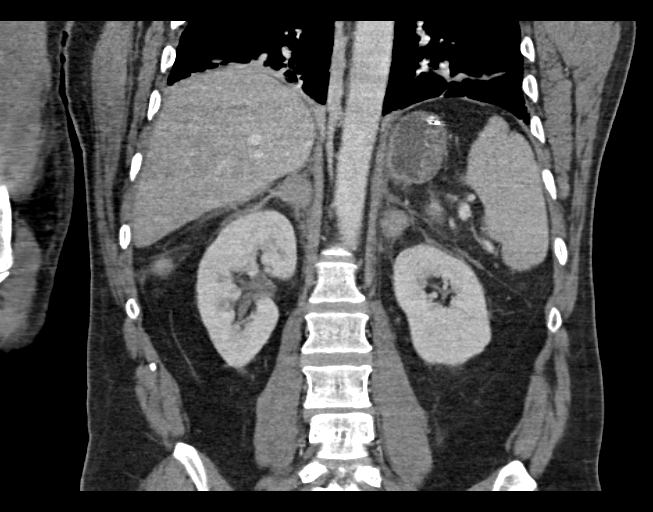

[11 of 46 positions shown; findings below may reference images not displayed]

FINDINGS: Lower chest: Bilateral lower lobe atelectasis with trace bilateral
pleural effusions.

Hepatobiliary: 6 mm cyst in the right hepatic lobe.

Layering gallstones with vicarious excretion of contrast. No
inflammatory changes. No intrahepatic or extrahepatic ductal
dilatation.

Pancreas: Within normal limits.

Spleen: Within normal limits.

Adrenals/Urinary Tract: 3.4 x 2.3 cm right adrenal hemorrhage, new.
3.4 x 2.5 cm left adrenal hemorrhage, increased, previously 3.2 x
2.6 cm. Surrounding retroperitoneal stranding/ inflammatory changes.

No evidence of underlying adrenal mass.

9 mm lateral interpolar right renal cyst. No enhancing renal
lesions.

No renal calculi or hydronephrosis.

Stomach/Bowel: Stomach is notable for an indwelling enteric tube
terminating in the posterior gastric cardia.

Visualized bowel is unremarkable.

Vascular/Lymphatic: No evidence abdominal aortic aneurysm.

No suspicious abdominal lymphadenopathy.

Other: No abdominal ascites.

Musculoskeletal: No focal osseous lesions.
IMPRESSION: 3.4 x 2.3 cm right adrenal hemorrhage, new.

3.4 x 2.5 cm left adrenal hemorrhage, increased.

No evidence of underlying adrenal mass.

## 2017-01-10 ENCOUNTER — Other Ambulatory Visit: Payer: Self-pay | Admitting: Family Medicine

## 2017-01-15 ENCOUNTER — Ambulatory Visit (INDEPENDENT_AMBULATORY_CARE_PROVIDER_SITE_OTHER): Payer: BLUE CROSS/BLUE SHIELD | Admitting: Pharmacist Clinician (PhC)/ Clinical Pharmacy Specialist

## 2017-01-15 DIAGNOSIS — I823 Embolism and thrombosis of renal vein: Secondary | ICD-10-CM

## 2017-01-15 DIAGNOSIS — Z7901 Long term (current) use of anticoagulants: Secondary | ICD-10-CM

## 2017-01-15 LAB — POCT INR: INR: 3.2

## 2017-01-22 ENCOUNTER — Other Ambulatory Visit: Payer: Self-pay | Admitting: Cardiology

## 2017-02-04 ENCOUNTER — Other Ambulatory Visit: Payer: Self-pay | Admitting: Family Medicine

## 2017-02-05 ENCOUNTER — Other Ambulatory Visit: Payer: Self-pay | Admitting: Family Medicine

## 2017-02-12 ENCOUNTER — Other Ambulatory Visit: Payer: Self-pay | Admitting: Cardiology

## 2017-02-12 ENCOUNTER — Ambulatory Visit (INDEPENDENT_AMBULATORY_CARE_PROVIDER_SITE_OTHER): Payer: BLUE CROSS/BLUE SHIELD | Admitting: Pharmacist

## 2017-02-12 ENCOUNTER — Other Ambulatory Visit: Payer: Self-pay | Admitting: Family Medicine

## 2017-02-12 DIAGNOSIS — Z7901 Long term (current) use of anticoagulants: Secondary | ICD-10-CM | POA: Diagnosis not present

## 2017-02-12 DIAGNOSIS — I823 Embolism and thrombosis of renal vein: Secondary | ICD-10-CM

## 2017-02-12 LAB — POCT INR: INR: 3.1

## 2017-02-13 ENCOUNTER — Other Ambulatory Visit: Payer: Self-pay | Admitting: Family Medicine

## 2017-02-13 NOTE — Telephone Encounter (Signed)
REFILL 

## 2017-02-22 DIAGNOSIS — D126 Benign neoplasm of colon, unspecified: Secondary | ICD-10-CM | POA: Diagnosis not present

## 2017-02-22 DIAGNOSIS — K8309 Other cholangitis: Secondary | ICD-10-CM | POA: Diagnosis not present

## 2017-02-22 DIAGNOSIS — K8301 Primary sclerosing cholangitis: Secondary | ICD-10-CM | POA: Diagnosis not present

## 2017-02-22 DIAGNOSIS — K8050 Calculus of bile duct without cholangitis or cholecystitis without obstruction: Secondary | ICD-10-CM | POA: Diagnosis not present

## 2017-03-01 ENCOUNTER — Encounter: Payer: Self-pay | Admitting: Family Medicine

## 2017-03-01 DIAGNOSIS — K8301 Primary sclerosing cholangitis: Secondary | ICD-10-CM | POA: Insufficient documentation

## 2017-03-01 DIAGNOSIS — K8309 Other cholangitis: Secondary | ICD-10-CM | POA: Insufficient documentation

## 2017-03-05 DIAGNOSIS — H2513 Age-related nuclear cataract, bilateral: Secondary | ICD-10-CM | POA: Diagnosis not present

## 2017-03-05 DIAGNOSIS — H40013 Open angle with borderline findings, low risk, bilateral: Secondary | ICD-10-CM | POA: Diagnosis not present

## 2017-03-05 DIAGNOSIS — H40003 Preglaucoma, unspecified, bilateral: Secondary | ICD-10-CM | POA: Diagnosis not present

## 2017-03-05 DIAGNOSIS — H5213 Myopia, bilateral: Secondary | ICD-10-CM | POA: Diagnosis not present

## 2017-03-05 DIAGNOSIS — H52221 Regular astigmatism, right eye: Secondary | ICD-10-CM | POA: Diagnosis not present

## 2017-03-05 DIAGNOSIS — H25013 Cortical age-related cataract, bilateral: Secondary | ICD-10-CM | POA: Diagnosis not present

## 2017-03-05 DIAGNOSIS — H18413 Arcus senilis, bilateral: Secondary | ICD-10-CM | POA: Diagnosis not present

## 2017-03-08 DIAGNOSIS — E271 Primary adrenocortical insufficiency: Secondary | ICD-10-CM | POA: Diagnosis not present

## 2017-03-12 ENCOUNTER — Encounter: Payer: Self-pay | Admitting: Family Medicine

## 2017-03-12 ENCOUNTER — Ambulatory Visit (INDEPENDENT_AMBULATORY_CARE_PROVIDER_SITE_OTHER): Payer: Medicare HMO | Admitting: Family Medicine

## 2017-03-12 ENCOUNTER — Telehealth: Payer: Self-pay | Admitting: Family Medicine

## 2017-03-12 ENCOUNTER — Other Ambulatory Visit: Payer: Self-pay

## 2017-03-12 VITALS — BP 136/92 | HR 80 | Temp 98.0°F | Resp 18 | Wt 195.0 lb

## 2017-03-12 DIAGNOSIS — T148XXA Other injury of unspecified body region, initial encounter: Secondary | ICD-10-CM

## 2017-03-12 LAB — PROTIME-INR
INR: 2.4 — ABNORMAL HIGH
Prothrombin Time: 25.1 s — ABNORMAL HIGH (ref 9.0–11.5)

## 2017-03-12 MED ORDER — AZELASTINE HCL 0.1 % NA SOLN: 2.0000 | Freq: Two times a day (BID) | NASAL | 12 refills | Status: DC
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
## 2017-03-12 NOTE — Telephone Encounter (Signed)
Patient called to make appointment, I made one for 03/12/2017, however I spoke to christina and she suggested I make one for today because patient is on coumadin

## 2017-03-12 NOTE — Telephone Encounter (Signed)
Per receptionist, patient has injury to arm that caused bleeding under skin. Patient managed by office for coumadin.   Advised to schedule appt for patient to come in today, or advise patient to go to [facility].

## 2017-03-12 NOTE — Telephone Encounter (Signed)
Patient

## 2017-03-13 ENCOUNTER — Ambulatory Visit: Payer: BLUE CROSS/BLUE SHIELD | Admitting: Family Medicine

## 2017-03-17 DIAGNOSIS — R69 Illness, unspecified: Secondary | ICD-10-CM | POA: Diagnosis not present

## 2017-03-26 ENCOUNTER — Ambulatory Visit (INDEPENDENT_AMBULATORY_CARE_PROVIDER_SITE_OTHER): Payer: Medicare HMO | Admitting: Pharmacist Clinician (PhC)/ Clinical Pharmacy Specialist

## 2017-03-26 DIAGNOSIS — I823 Embolism and thrombosis of renal vein: Secondary | ICD-10-CM

## 2017-03-26 DIAGNOSIS — Z7901 Long term (current) use of anticoagulants: Secondary | ICD-10-CM | POA: Diagnosis not present

## 2017-03-26 LAB — POCT INR: INR: 2.7

## 2017-04-06 DIAGNOSIS — E2740 Unspecified adrenocortical insufficiency: Secondary | ICD-10-CM | POA: Diagnosis not present

## 2017-04-30 ENCOUNTER — Encounter: Payer: Self-pay | Admitting: Family Medicine

## 2017-04-30 ENCOUNTER — Ambulatory Visit (INDEPENDENT_AMBULATORY_CARE_PROVIDER_SITE_OTHER): Payer: Medicare HMO | Admitting: Family Medicine

## 2017-04-30 ENCOUNTER — Ambulatory Visit (INDEPENDENT_AMBULATORY_CARE_PROVIDER_SITE_OTHER): Payer: Medicare HMO | Admitting: Pharmacist

## 2017-04-30 VITALS — BP 154/88 | HR 70 | Temp 98.7°F | Resp 16 | Ht 69.0 in | Wt 191.0 lb

## 2017-04-30 DIAGNOSIS — Z0000 Encounter for general adult medical examination without abnormal findings: Secondary | ICD-10-CM | POA: Diagnosis not present

## 2017-04-30 DIAGNOSIS — I823 Embolism and thrombosis of renal vein: Secondary | ICD-10-CM

## 2017-04-30 DIAGNOSIS — K8301 Primary sclerosing cholangitis: Secondary | ICD-10-CM

## 2017-04-30 DIAGNOSIS — Z7901 Long term (current) use of anticoagulants: Secondary | ICD-10-CM | POA: Diagnosis not present

## 2017-04-30 DIAGNOSIS — E2740 Unspecified adrenocortical insufficiency: Secondary | ICD-10-CM | POA: Diagnosis not present

## 2017-04-30 LAB — POCT INR: INR: 2.2

## 2017-04-30 NOTE — Progress Notes (Signed)
Subjective:    Patient ID: Kenneth Cline, male    DOB: 1955/11/30, 62 y.o.   MRN: [id number]  HPI  Patient presents today for complete physical exam. His flu shot is up-to-date. He has had Prevnar 13 one month ago. He believes he's had Pneumovax 23 within the last 5 years. His colonoscopy has been within the last year and is up-to-date. He is due for a PSA to screen for prostate cancer. His blood pressures elevated today. Typically is well controlled and in fact he takes fludrocortisone for adrenal insufficiency. He attributes some of this elevation in his blood pressure white coat syndrome. His only concern is persistent reflux despite taking pantoprazole twice daily. This is been going on for the last 3 weeks and primarily occurs at night when he lies down. He reports indigestion and burning up in his upper esophagus and he lies down. It also seems to be dependent on the types of food he is eating.  He has a very complicated past medical history. He has a history of lupus anticoagulant. 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  He is on chronic fludrocortisone as well as prednisone for this. He has a history of type 2 diabetes as well as hypothyroidism which is treated by endocrinology. He also has a history of acute cholangitis requiring ERCP and bile duct stenting.  This is due to primary sclerosing cholangitis Past Medical History:  Diagnosis Date  . Adrenal hemorrhage [facility]) April 2016   Following shoulder surgery  . Adrenal insufficiency, primary, hemorrhagic [facility]) April 2016  . CVA (cerebral infarction) January 2017   3 small areas of infarct in both the cerebrum as well as the cerebellum -- , located with diplopia  . 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 Dyslipidemia   . Erectile dysfunction   . Essential hypertension   . Family history of premature  coronary artery disease    2 brothers, one sister and father all with CAD.  One sister with valve disease.  Marland Kitchen GERD (gastroesophageal reflux disease)   . H/O acute cholangitis May 4562   Complicated by septic shock, acute pancreatitis and acute choledocholithiasis;   . Lupus anticoagulant with hemorrhagic disorder (HCC)    Previously on Xarelto. Now on warfarin  . Overweight (BMI 25.0-29.9)   . PSC (primary sclerosing cholangitis)    managed by [facility] Koleen [facility]  . Recurrent cholangitis July 2016; October and November 2016, February 2017   Initial ERCP with biliary stent placement followed by cholecystectomy forr cholecystitis- seen at [facility] and [facility] felt to be [place]  . Septic shock due to Escherichia coli [facility]) October 2016; January 2017   Escherichia coli bacteremia secondary to recurrent cholangitis -- status post ERCP  . 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 Type 2 diabetes mellitus [facility]) February 2017   Past Surgical History:  Procedure Laterality Date  . CPET/MET  11/27/2011   normal PFT, good effort-no ischemia burden  . ERCP W/ METAL STENT PLACEMENT  October 2016; November 2016   [facility] [place] - stent removal  . ERCP W/ METAL STENT PLACEMENT  06/01/2015   [place]. Likely cholangitis  . ERCP W/ PLASTIC STENT PLACEMENT  Sep 09 2014   [facility] [place] -  Dr. Alvera Novel  . ERCP W/ SPHINCTEROTOMY AND BALLOON DILATION  11/10/2014   [facility] [place] -- Removal of biliary stent,, to PACU duodenitis  . LAPAROSCOPIC CHOLECYSTECTOMY W/ CHOLANGIOGRAPHY  November 13 2014   [facility] [place], Dr. Devota Pace  . NM MYOCAR PERF WALL MOTION  06/28/2006   EF 34% , LV systolic fx norm.  Marland Kitchen ROTATOR CUFF REPAIR    . TRANSTHORACIC ECHOCARDIOGRAM  04/2015   No pericardial effusion. No shunt. Full study: Basal septal LVH. Pseudo-normal filling (GR 2 DD). EF 57%.  .  TRANSTHORACIC ECHOCARDIOGRAM  08/2015   [facility]): Normal LV size. Basal septal hypertrophy. EF 57%. Pseudo-normal filling. Mild focal aortic valve thickening. (Study was done to evaluate for possible endocarditis)   Current Outpatient Medications on File Prior to Visit  Medication Sig Dispense Refill  . azelastine (ASTELIN) 0.1 % nasal spray Place 2 sprays 2 (two) times daily into both nostrils. Use in each nostril as directed 30 mL 12  . ezetimibe (ZETIA) 10 MG tablet TAKE 1 TABLET (10 MG TOTAL) BY MOUTH DAILY. NEED OV. 90 tablet 3  . fludrocortisone (FLORINEF) 0.1 MG tablet Take 0.1 mg by mouth.    . gabapentin (NEURONTIN) 300 MG capsule TAKE 1 CAPSULE THREE TIMES A DAY FOR 30 DAYS. MAY INCREASE TO UP TO 6 CAPSULES A DAY 180 capsule 3  . ipratropium (ATROVENT) 0.06 % nasal spray 1-2 SPRAYS IN NOSTRILS 3 (THREE) TIMES DAILY AS NEEDED FOR RHINITIS (RUNNY NOSE/POST NASAL DRIP). 45 mL 0  . JANUVIA 100 MG tablet TAKE 1 TABLET BY MOUTH EVERY DAY 30 tablet 0  . magnesium oxide (MAG-OX) 400 MG tablet Take 400 mg by mouth daily.    . mometasone (NASONEX) 50 MCG/ACT nasal spray Place 2 sprays into the nose daily.    . pantoprazole (PROTONIX) 40 MG tablet TAKE 1 TABLET BY MOUTH TWICE A DAY 60 tablet 0  . pantoprazole (PROTONIX) 40 MG tablet TAKE 1 TABLET BY MOUTH TWICE A DAY 60 tablet 0  . Probiotic Product (PROBIOTIC DAILY PO) Take by mouth.    . sertraline (ZOLOFT) 50 MG tablet TAKE 1 TABLET (50 MG TOTAL) BY MOUTH DAILY. 90 tablet 3  . traMADol (ULTRAM) 50 MG tablet Take 1 tablet (50 mg total) by mouth every 12 (twelve) hours as needed for moderate pain. 30 tablet 0  . ursodiol (ACTIGALL) 500 MG tablet Take 500 mg by mouth 2 (two) times daily.  5  . warfarin (COUMADIN) 5 MG tablet TAKE 1 TO 1 AND 1/2 TABLETS BY MOUTH DAILY AS DIRECTED BY COUMADIN CLINIC 135 tablet 1   No current facility-administered medications on file prior to visit.    Allergies  Allergen Reactions  . Biaxin [Clarithromycin] Rash    . Keflex [Cephalexin] Hives, Itching and Rash  . Erythromycin     Was told not to take d/t medical problems  . Penicillins     REACTION: rash   Social History   Socioeconomic History  . Marital status: Married    Spouse name: Not on file  . Number of children: 2  . Years of education: Not on file  . Highest education level: Not on file  Social Needs  . Financial resource strain: Not on file  . Food insecurity - worry: Not on file  . Food insecurity - inability: Not on file  . Transportation needs - medical: Not on file  . Transportation needs - non-medical: Not on file  Occupational History    Employer: [employer]  Tobacco Use  . Smoking status: Never Smoker  . Smokeless tobacco: Never Used  Substance and Sexual Activity  . Alcohol use: Yes    Alcohol/week: 1.5 oz    Types: 3 Standard drinks or equivalent per week    Comment: occassional  . Drug use: No  . Sexual activity: Not on file  Other Topics Concern  . Not on file  Social History Narrative   He is a married father of 2 with 3 stepchildren.  He has 3 grandchildren his own and 3 step grandchildren.  He works as a Dealer for [employer].  He lives with his wife, [name], of 8 years.  He does not, and never did smoke.  He takes occasional alcohol beverage but nothing significant.  He does not do routine exercise, but does walk a lot at work.     Review of Systems  All other systems reviewed and are negative.      Objective:   Physical Exam  Constitutional: He is oriented to person, place, and time. He appears well-developed and well-nourished. No distress.  HENT:  Head: Normocephalic and atraumatic.  Right Ear: External ear normal.  Left Ear: External ear normal.  Nose: Nose normal.  Mouth/Throat: Oropharynx is clear and moist. No oropharyngeal exudate.  Eyes: Conjunctivae and EOM are normal. Pupils are equal, round, and reactive to light. Right eye exhibits no discharge. Left eye exhibits no discharge. No scleral icterus.   Neck: Normal range of motion. Neck supple. No JVD present. No tracheal deviation present. No thyromegaly present.  Cardiovascular: Normal rate, regular rhythm and normal heart sounds. Exam reveals no gallop and no friction rub.  No murmur heard. Pulmonary/Chest: Effort normal and breath sounds normal. No stridor. No respiratory distress. He has no wheezes. He has no rales. He exhibits no tenderness.  Abdominal: Soft. Bowel sounds are normal. He exhibits no distension and no mass. There is no tenderness. There is no rebound and no guarding.  Musculoskeletal: He exhibits no edema.  Lymphadenopathy:    He has no cervical adenopathy.  Neurological: He is alert and oriented to person, place, and time. He has normal reflexes. He displays normal reflexes. No cranial nerve deficit. He exhibits normal muscle tone. Coordination normal.  Skin: Skin is warm. No rash noted. He is not diaphoretic. No erythema. No pallor.  Psychiatric: He has a normal mood and affect. His behavior is normal. Judgment and thought content normal.  Vitals reviewed.        Assessment & Plan:  General medical exam - Plan: BASIC METABOLIC PANEL WITH GFR, PSA, Lipid panel, CBC with Differential/Platelet  PSC (primary sclerosing cholangitis)  Renal vein thrombosis (HCC)  Long term (current) use of anticoagulants  Adrenal insufficiency (HCC)  His physical today is normal other than his elevated blood pressure. Patient would like to check his blood pressure at home over the next several days and notify me of the values. If persistently elevated, we may need to decrease his use of fludrocortisone or depending upon endocrinology's input treat his blood pressure. He has a history of an embolic stroke however I would like to check a fasting lipid panel. Given his history of stroke, typical goal LDL cholesterol be less than 70 however I will be willing to accept an LDL cholesterol below 100 given the fact he was an embolic stroke.  His liver tests are monitored by his gastroenterologist. He sees an endocrinologist who monitors his blood sugar and his thyroid. He is due for a PSA  to screen for prostate cancer. Regarding his immunizations, his shots are up-to-date except possibly Pneumovax 23 which he will pen down when he goes to see his specialist at [facility] at his next visit. His INR is monitored elsewhere in the system but is up-to-date.  He will add Zantac 150 mg by mouth daily at bedtime to his PPI for 3 weeks and see if symptoms will improve

## 2017-05-01 ENCOUNTER — Encounter: Payer: Self-pay | Admitting: Family Medicine

## 2017-05-01 LAB — BASIC METABOLIC PANEL WITH GFR
BUN/Creatinine Ratio: 26 (calc) — ABNORMAL HIGH (ref 6–22)
BUN: 27 mg/dL — ABNORMAL HIGH (ref 7–25)
CALCIUM: 9.1 mg/dL (ref 8.6–10.3)
CHLORIDE: 104 mmol/L (ref 98–110)
CO2: 23 mmol/L (ref 20–32)
Creat: 1.04 mg/dL (ref 0.70–1.25)
GFR, EST AFRICAN AMERICAN: 89 mL/min/{1.73_m2} (ref >=60)
GFR, Est Non African American: 77 mL/min/{1.73_m2} (ref >=60)
GLUCOSE: 96 mg/dL (ref 65–99)
POTASSIUM: 4 mmol/L (ref 3.5–5.3)
Sodium: 138 mmol/L (ref 135–146)

## 2017-05-01 LAB — LIPID PANEL
CHOLESTEROL: 226 mg/dL — AB (ref <200)
HDL: 49 mg/dL (ref >40)
LDL Cholesterol (Calc): 144 mg/dL (calc) — ABNORMAL HIGH
Non-HDL Cholesterol (Calc): 177 mg/dL (calc) — ABNORMAL HIGH (ref <130)
TRIGLYCERIDES: 191 mg/dL — AB (ref <150)
Total CHOL/HDL Ratio: 4.6 (calc) (ref <5.0)

## 2017-05-01 LAB — CBC WITH DIFFERENTIAL/PLATELET
Basophils Absolute: 44 cells/uL (ref 0–200)
Basophils Relative: 0.5 %
EOS ABS: 174 {cells}/uL (ref 15–500)
Eosinophils Relative: 2 %
HEMATOCRIT: 45.4 % (ref 38.5–50.0)
Hemoglobin: 15.4 g/dL (ref 13.2–17.1)
LYMPHS ABS: 1366 {cells}/uL (ref 850–3900)
MCH: 28.9 pg (ref 27.0–33.0)
MCHC: 33.9 g/dL (ref 32.0–36.0)
MCV: 85.3 fL (ref 80.0–100.0)
MPV: 9.7 fL (ref 7.5–12.5)
Monocytes Relative: 6.6 %
NEUTROS PCT: 75.2 %
Neutro Abs: 6542 cells/uL (ref 1500–7800)
Platelets: 261 10*3/uL (ref 140–400)
RBC: 5.32 10*6/uL (ref 4.20–5.80)
RDW: 13.1 % (ref 11.0–15.0)
Total Lymphocyte: 15.7 %
WBC: 8.7 10*3/uL (ref 3.8–10.8)
WBCMIX: 574 {cells}/uL (ref 200–950)

## 2017-05-01 LAB — PSA: PSA: 0.6 ng/mL (ref <=4.0)

## 2017-05-10 DIAGNOSIS — R69 Illness, unspecified: Secondary | ICD-10-CM | POA: Diagnosis not present

## 2017-05-13 ENCOUNTER — Other Ambulatory Visit: Payer: Self-pay | Admitting: Family Medicine

## 2017-05-28 DIAGNOSIS — K8301 Primary sclerosing cholangitis: Secondary | ICD-10-CM | POA: Diagnosis not present

## 2017-06-11 ENCOUNTER — Ambulatory Visit (INDEPENDENT_AMBULATORY_CARE_PROVIDER_SITE_OTHER): Payer: Medicare HMO | Admitting: Pharmacist

## 2017-06-11 DIAGNOSIS — Z7901 Long term (current) use of anticoagulants: Secondary | ICD-10-CM | POA: Diagnosis not present

## 2017-06-11 DIAGNOSIS — I823 Embolism and thrombosis of renal vein: Secondary | ICD-10-CM | POA: Diagnosis not present

## 2017-06-11 LAB — POCT INR: INR: 1.8

## 2017-06-13 ENCOUNTER — Other Ambulatory Visit: Payer: Self-pay | Admitting: Family Medicine

## 2017-06-13 MED ORDER — PANTOPRAZOLE SODIUM 40 MG PO TBEC: 40.0000 mg | DELAYED_RELEASE_TABLET | Freq: Two times a day (BID) | ORAL | 3 refills | Status: DC

## 2017-06-18 ENCOUNTER — Other Ambulatory Visit: Payer: Self-pay | Admitting: *Deleted

## 2017-06-18 MED ORDER — PANTOPRAZOLE SODIUM 40 MG PO TBEC: 40.0000 mg | DELAYED_RELEASE_TABLET | Freq: Two times a day (BID) | ORAL | 3 refills | Status: DC

## 2017-06-27 DIAGNOSIS — E038 Other specified hypothyroidism: Secondary | ICD-10-CM | POA: Diagnosis not present

## 2017-06-27 DIAGNOSIS — E271 Primary adrenocortical insufficiency: Secondary | ICD-10-CM | POA: Diagnosis not present

## 2017-06-27 DIAGNOSIS — E063 Autoimmune thyroiditis: Secondary | ICD-10-CM | POA: Diagnosis not present

## 2017-06-27 DIAGNOSIS — E119 Type 2 diabetes mellitus without complications: Secondary | ICD-10-CM | POA: Diagnosis not present

## 2017-06-27 DIAGNOSIS — E039 Hypothyroidism, unspecified: Secondary | ICD-10-CM | POA: Diagnosis not present

## 2017-07-09 ENCOUNTER — Ambulatory Visit (INDEPENDENT_AMBULATORY_CARE_PROVIDER_SITE_OTHER): Payer: Medicare HMO | Admitting: Pharmacist

## 2017-07-09 DIAGNOSIS — I823 Embolism and thrombosis of renal vein: Secondary | ICD-10-CM | POA: Diagnosis not present

## 2017-07-09 DIAGNOSIS — Z7901 Long term (current) use of anticoagulants: Secondary | ICD-10-CM | POA: Diagnosis not present

## 2017-07-09 LAB — POCT INR: INR: 2.7

## 2017-07-16 ENCOUNTER — Other Ambulatory Visit: Payer: Self-pay | Admitting: Cardiology

## 2017-07-18 ENCOUNTER — Ambulatory Visit: Payer: Medicare HMO | Admitting: Family Medicine

## 2017-07-18 ENCOUNTER — Encounter: Payer: Self-pay | Admitting: Family Medicine

## 2017-07-30 DIAGNOSIS — R69 Illness, unspecified: Secondary | ICD-10-CM | POA: Diagnosis not present

## 2017-07-30 DIAGNOSIS — K8301 Primary sclerosing cholangitis: Secondary | ICD-10-CM | POA: Diagnosis not present

## 2017-08-04 DIAGNOSIS — H66003 Acute suppurative otitis media without spontaneous rupture of ear drum, bilateral: Secondary | ICD-10-CM | POA: Diagnosis not present

## 2017-08-04 DIAGNOSIS — J0100 Acute maxillary sinusitis, unspecified: Secondary | ICD-10-CM | POA: Diagnosis not present

## 2017-08-04 DIAGNOSIS — R05 Cough: Secondary | ICD-10-CM | POA: Diagnosis not present

## 2017-08-13 ENCOUNTER — Ambulatory Visit (INDEPENDENT_AMBULATORY_CARE_PROVIDER_SITE_OTHER): Payer: Medicare HMO | Admitting: Pharmacist

## 2017-08-13 DIAGNOSIS — Z7901 Long term (current) use of anticoagulants: Secondary | ICD-10-CM

## 2017-08-13 DIAGNOSIS — I823 Embolism and thrombosis of renal vein: Secondary | ICD-10-CM

## 2017-08-13 LAB — POCT INR: INR: 3.5

## 2017-08-23 DIAGNOSIS — Z86718 Personal history of other venous thrombosis and embolism: Secondary | ICD-10-CM | POA: Diagnosis not present

## 2017-08-23 DIAGNOSIS — Z8673 Personal history of transient ischemic attack (TIA), and cerebral infarction without residual deficits: Secondary | ICD-10-CM | POA: Diagnosis not present

## 2017-08-23 DIAGNOSIS — Z7901 Long term (current) use of anticoagulants: Secondary | ICD-10-CM | POA: Diagnosis not present

## 2017-08-23 DIAGNOSIS — Z8679 Personal history of other diseases of the circulatory system: Secondary | ICD-10-CM | POA: Diagnosis not present

## 2017-08-23 DIAGNOSIS — D6862 Lupus anticoagulant syndrome: Secondary | ICD-10-CM | POA: Diagnosis not present

## 2017-08-23 DIAGNOSIS — D704 Cyclic neutropenia: Secondary | ICD-10-CM | POA: Diagnosis not present

## 2017-08-23 DIAGNOSIS — E119 Type 2 diabetes mellitus without complications: Secondary | ICD-10-CM | POA: Diagnosis not present

## 2017-08-23 DIAGNOSIS — E2740 Unspecified adrenocortical insufficiency: Secondary | ICD-10-CM | POA: Diagnosis not present

## 2017-08-23 DIAGNOSIS — D126 Benign neoplasm of colon, unspecified: Secondary | ICD-10-CM | POA: Diagnosis not present

## 2017-08-23 DIAGNOSIS — K8301 Primary sclerosing cholangitis: Secondary | ICD-10-CM | POA: Diagnosis not present

## 2017-09-06 ENCOUNTER — Encounter: Payer: Self-pay | Admitting: Family Medicine

## 2017-09-06 ENCOUNTER — Ambulatory Visit (INDEPENDENT_AMBULATORY_CARE_PROVIDER_SITE_OTHER): Payer: Medicare HMO | Admitting: Family Medicine

## 2017-09-06 ENCOUNTER — Other Ambulatory Visit: Payer: Self-pay

## 2017-09-06 VITALS — BP 138/74 | HR 76 | Temp 98.5°F | Resp 14 | Ht 69.0 in | Wt 188.0 lb

## 2017-09-06 DIAGNOSIS — M25451 Effusion, right hip: Secondary | ICD-10-CM

## 2017-09-06 DIAGNOSIS — R1031 Right lower quadrant pain: Secondary | ICD-10-CM

## 2017-09-06 DIAGNOSIS — S76211A Strain of adductor muscle, fascia and tendon of right thigh, initial encounter: Secondary | ICD-10-CM | POA: Diagnosis not present

## 2017-09-06 MED ORDER — HYDROCODONE-ACETAMINOPHEN 5-325 MG PO TABS: 1.0000 | ORAL_TABLET | Freq: Four times a day (QID) | ORAL | 0 refills | Status: DC | PRN

## 2017-09-06 MED ORDER — CYCLOBENZAPRINE HCL 10 MG PO TABS: 10.0000 mg | ORAL_TABLET | Freq: Three times a day (TID) | ORAL | 0 refills | Status: DC | PRN

## 2017-09-06 NOTE — Progress Notes (Signed)
Patient ID: Kenneth Cline, male    DOB: 1956/04/20, 62 y.o.   MRN: [id number]  PCP: Susy Frizzle, MD  Chief Complaint  Patient presents with  . R Sided Groin Pain    x2 weeks- stepped off step stool wrong and pulled muscle in groin- has used ICE, rest, muscle relaxer- reports that he can't take NSAIDs    Subjective:   Kenneth Cline is a 62 y.o. male, presents to clinic with CC of sharp severe and shooting pain to the right inner thigh and groin that began after he slipped into almost the splits of roughly 2 weeks ago.  He feels like he pulled a muscle.  Pain started fairly constant now is worse in severity, but more intermittent and exacerbated with walking sitting and with lifting his leg forward.  He also has swelling to promixal medial right thigh.  No improvement with rest, ice, muscle relaxers.  He cannot take NSAIDs due to addrenal insufficiency and prednisone use since he was 62 y/o.   He denies numbness, tingling, weakness, erythema.  He denies scrotal swelling, testicular pain, inguinal canal (R) bulge or mass.  No abdominal pain.  No urinary sx.     Patient Active Problem List   Diagnosis Date Noted  . Recurrent cholangitis   . PSC (primary sclerosing cholangitis)   . CVA (cerebral infarction) 06/21/2015  . Renal vein thrombosis ([place]) 06/21/2015  . Long term (current) use of anticoagulants 06/21/2015  . Thrombosis of left renal vein (HCC) 08/23/2014  . Diarrhea 08/18/2014  . Hypothyroidism 08/17/2014  . Purpura ([place]) 08/16/2014  . Bleeding hemorrhoids 08/16/2014  . Fever 08/15/2014  . Constipation 08/15/2014  . Adrenal insufficiency ([place]) 08/14/2014  . Hyponatremia 08/14/2014  . Hypokalemia 08/14/2014  . Adrenal hemorrhage ([place]) 08/11/2014  . Hypertension, essential, benign     Class: Chronic  . Dyslipidemia   . Overweight (BMI 25.0-29.9)   . Erectile dysfunction   . Family history of premature coronary artery disease   . ALLERGIC REACTION 06/14/2007      Prior to Admission medications   Medication Sig Start Date End Date Taking? Authorizing Provider  azelastine (ASTELIN) 0.1 % nasal spray Place 2 sprays 2 (two) times daily into both nostrils. Use in each nostril as directed 03/12/17  Yes Pickard, Cammie Mcgee, MD  ezetimibe (ZETIA) 10 MG tablet TAKE 1 TABLET (10 MG TOTAL) BY MOUTH DAILY. NEED OV. 02/13/17  Yes Leonie Man, MD  fludrocortisone (FLORINEF) 0.1 MG tablet Take 0.1 mg by mouth. 10/02/16 10/02/17 Yes [provider]  gabapentin (NEURONTIN) 300 MG capsule TAKE 1 CAPSULE THREE TIMES A DAY FOR 30 DAYS. MAY INCREASE TO UP TO 6 CAPSULES A DAY 05/14/17  Yes Susy Frizzle, MD  ipratropium (ATROVENT) 0.06 % nasal spray 1-2 SPRAYS IN NOSTRILS 3 (THREE) TIMES DAILY AS NEEDED FOR RHINITIS (RUNNY NOSE/POST NASAL DRIP). 02/13/17  Yes Susy Frizzle, MD  JANUVIA 100 MG tablet TAKE 1 TABLET BY MOUTH EVERY DAY 12/14/16  Yes Susy Frizzle, MD  magnesium oxide (MAG-OX) 400 MG tablet Take 400 mg by mouth daily.   Yes [provider]  mometasone (NASONEX) 50 MCG/ACT nasal spray Place 2 sprays into the nose daily.   Yes [provider]  pantoprazole (PROTONIX) 40 MG tablet Take 1 tablet (40 mg total) by mouth 2 (two) times daily. 06/18/17  Yes Susy Frizzle, MD  Probiotic Product (PROBIOTIC DAILY PO) Take by mouth.   Yes [provider]  sertraline (ZOLOFT) 50 MG tablet TAKE 1 TABLET (50 MG TOTAL) BY MOUTH DAILY. 12/11/16  Yes Susy Frizzle, MD  traMADol (ULTRAM) 50 MG tablet Take 1 tablet (50 mg total) by mouth every 12 (twelve) hours as needed for moderate pain. 08/20/14  Yes Geradine Girt, DO  ursodiol (ACTIGALL) 500 MG tablet Take 500 mg by mouth 2 (two) times daily. 10/31/16  Yes [provider]  warfarin (COUMADIN) 5 MG tablet TAKE 1 TO 1.5 TABLETS BY MOUTH DAILY AS DIRECTED BY COUMADIN CLINIC 07/17/17  Yes Leonie Man, MD     Allergies  Allergen Reactions  . Biaxin  [Clarithromycin] Rash    SJS  . Keflex [Cephalexin] Hives, Itching and Rash  . Erythromycin     Was told not to take d/t medical problems  . Penicillins     REACTION: rash     Family History  Problem Relation Age of Onset  . Heart attack Father 73       Diet cardiac arrest  . Sudden death Father 11  . Heart disease Sister 70       triscuspid valve insuff,cardioversion  . Hypertension Sister        She is currently in her 35s as well  . Hypertension Brother   . Heart disease Brother 59       CABG; now age 55  . Ovarian cancer Maternal Grandmother   . Heart attack Brother 34       Died from cardiac arrest  . Hypertension Brother   . Hypertension Sister   . Heart disease Sister 73       CABG plus valve in 08/01/22;   . Sudden death Brother   . Heart failure Sister        Died from complications of CHF following bowel surgery     Social History   Socioeconomic History  . Marital status: Married    Spouse name: Not on file  . Number of children: 2  . Years of education: Not on file  . Highest education level: Not on file  Occupational History    Employer: [employer]  . Financial resource strain: Not on file  . Food insecurity:    Worry: Not on file    Inability: Not on file  . Transportation needs:    Medical: Not on file    Non-medical: Not on file  Tobacco Use  . Smoking status: Never Smoker  . Smokeless tobacco: Never Used  Substance and Sexual Activity  . Alcohol use: Yes    Alcohol/week: 1.5 oz    Types: 3 Standard drinks or equivalent per week    Comment: occassional  . Drug use: No  . Sexual activity: Not on file  Lifestyle  . Physical activity:    Days per week: Not on file    Minutes per session: Not on file  . Stress: Not on file  Relationships  . Social connections:    Talks on phone: Not on file    Gets together: Not on file    Attends religious service: Not on file    Active member of club or organization: Not on file    Attends  meetings of clubs or organizations: Not on file    Relationship status: Not on file  . Intimate partner violence:    Fear of current or ex partner: Not on file    Emotionally abused: Not on file    Physically abused: Not  on file    Forced sexual activity: Not on file  Other Topics Concern  . Not on file  Social History Narrative   He is a married father of 2 with 3 stepchildren.  He has 3 grandchildren his own and 3 step grandchildren.  He works as a Dealer for [employer].  He lives with his wife, [name], of 8 years.  He does not, and never did smoke.  He takes occasional alcohol beverage but nothing significant.  He does not do routine exercise, but does walk a lot at work.     Review of Systems  Constitutional: Negative.   HENT: Negative.   Respiratory: Negative.   Cardiovascular: Negative.   Gastrointestinal: Negative.   Genitourinary: Negative.  Negative for difficulty urinating, enuresis, hematuria, scrotal swelling and testicular pain.  Musculoskeletal: Positive for arthralgias and gait problem. Negative for neck pain and neck stiffness.  Skin: Negative for color change, pallor and wound.  Neurological: Negative for weakness and numbness.  Hematological: Does not bruise/bleed easily.  All other systems reviewed and are negative.      Objective:    Vitals:   09/06/17 1127  BP: 138/74  Pulse: 76  Resp: 14  Temp: 98.5 F (36.9 C)  TempSrc: Oral  SpO2: 97%  Weight: 188 lb (85.3 kg)  Height: 5\' 9"  (1.753 m)      Physical Exam  Constitutional: He appears well-developed.  HENT:  Head: Normocephalic and atraumatic.  Nose: Nose normal.  Eyes: Conjunctivae are normal. Right eye exhibits no discharge. Left eye exhibits no discharge.  Neck: No tracheal deviation present.  Cardiovascular: Normal rate and regular rhythm.  Pulmonary/Chest: Effort normal. No stridor. No respiratory distress.  Abdominal: Hernia confirmed negative in the right inguinal area and confirmed  negative in the left inguinal area.  Genitourinary: Testes normal and penis normal. Right testis shows no mass, no swelling and no tenderness. Left testis shows no swelling.  Musculoskeletal:       Right hip: He exhibits tenderness and swelling. He exhibits normal range of motion, normal strength, no bony tenderness, no crepitus, no deformity and no laceration.       Thoracic back: Normal.       Lumbar back: Normal.       Legs: Area of swelling as outlined, central palpable nodule roughing 1x2 cm, no erythema, no tenderness, no induration.   Lymphadenopathy: Inguinal adenopathy noted on the right side. No inguinal adenopathy noted on the left side.  Neurological: He is alert. No sensory deficit. He exhibits normal muscle tone. Gait abnormal. Coordination normal.  Antalgic gait  Skin: Skin is warm and dry. Capillary refill takes less than 2 seconds. No rash noted.  Psychiatric: He has a normal mood and affect. His behavior is normal.  Nursing note and vitals reviewed.         Assessment & Plan:      ICD-10-CM   1. Groin strain, right, initial encounter S76.211A DG HIP UNILAT WITH PELVIS MIN 4 VIEWS RIGHT    cyclobenzaprine (FLEXERIL) 10 MG tablet    US SOFT TISSUE LOWER EXTREMITY LIMITED RIGHT (NON-VASCULAR)  2. Swelling of joint of pelvic region or thigh, right M25.451 DG HIP UNILAT WITH PELVIS MIN 4 VIEWS RIGHT    US SOFT TISSUE LOWER EXTREMITY LIMITED RIGHT (NON-VASCULAR)  3. Right groin pain R10.31 DG HIP UNILAT WITH PELVIS MIN 4 VIEWS RIGHT    HYDROcodone-acetaminophen (NORCO) 5-325 MG tablet    Patient with fall 2 weeks ago where he  slipped into the splits and fell onto his right side, has right groin pain and right superior inner thigh pain, with swelling to right groin, mild improvement in overall pain but he does continue to have severe sharp shooting pain intermittently and his gait is not normal yet.  History of adrenal insufficiency and steroid use for decades, will obtain  x-rays.   Continue tx with muscle relaxers, pain medicine, continue to rest and ice.   He declined to have physical therapy at this time. Does have swollen tissue and possible enlarged lymph node to the right femoral triangle, may be reactive lymph node to muscular tendon strain or tear in the area, no erythema or tenderness.  Discussed the possible utility of ultrasound imaging for evaluation of nodule/soft tissues, and could see about utility of muscles, tendons.  Will hold for now and just obtain xray.  He is unable to take NSAIDs because of other medications and steroid use  To use Tylenol and Norco sparingly.  Pt called after leaving clinic and requested order for the [place].    Delsa Grana, PA-C 09/06/17 12:11 PM

## 2017-09-07 ENCOUNTER — Ambulatory Visit
Admission: RE | Admit: 2017-09-07 | Discharge: 2017-09-07 | Disposition: A | Discharge status: Home / Self Care | Payer: Medicare HMO | Source: Ambulatory Visit | Attending: Family Medicine | Admitting: Family Medicine

## 2017-09-07 DIAGNOSIS — S79911A Unspecified injury of right hip, initial encounter: Secondary | ICD-10-CM | POA: Diagnosis not present

## 2017-09-07 DIAGNOSIS — M25551 Pain in right hip: Secondary | ICD-10-CM | POA: Diagnosis not present

## 2017-09-07 NOTE — Progress Notes (Signed)
Radiology report reviewed, images personally reviewed.  Agree with radiology report.  Pt will be called with negative imaging results.

## 2017-09-10 ENCOUNTER — Ambulatory Visit (INDEPENDENT_AMBULATORY_CARE_PROVIDER_SITE_OTHER): Payer: Medicare HMO | Admitting: Pharmacist

## 2017-09-10 DIAGNOSIS — Z7901 Long term (current) use of anticoagulants: Secondary | ICD-10-CM

## 2017-09-10 DIAGNOSIS — I823 Embolism and thrombosis of renal vein: Secondary | ICD-10-CM | POA: Diagnosis not present

## 2017-09-10 LAB — POCT INR: INR: 2.9

## 2017-09-14 ENCOUNTER — Other Ambulatory Visit: Payer: Self-pay

## 2017-09-14 ENCOUNTER — Ambulatory Visit
Admission: RE | Admit: 2017-09-14 | Discharge: 2017-09-14 | Disposition: A | Discharge status: Home / Self Care | Payer: Medicare HMO | Source: Ambulatory Visit | Attending: Family Medicine | Admitting: Family Medicine

## 2017-09-14 DIAGNOSIS — D1739 Benign lipomatous neoplasm of skin and subcutaneous tissue of other sites: Secondary | ICD-10-CM | POA: Diagnosis not present

## 2017-09-14 NOTE — Progress Notes (Signed)
Swollen area is benign lipoma, meaning enlarged fatty tissue.  If bothersome they can be removed, but are usually harmless.   No lymph nodes irregular or noted.  No discrete muscle tears or hematomas.  No worrisome soft tissue masses.

## 2017-09-24 DIAGNOSIS — D378 Neoplasm of uncertain behavior of other specified digestive organs: Secondary | ICD-10-CM | POA: Diagnosis not present

## 2017-09-24 DIAGNOSIS — K8301 Primary sclerosing cholangitis: Secondary | ICD-10-CM | POA: Diagnosis not present

## 2017-10-04 ENCOUNTER — Other Ambulatory Visit: Payer: Self-pay | Admitting: Cardiology

## 2017-10-05 ENCOUNTER — Other Ambulatory Visit: Payer: Self-pay | Admitting: Family Medicine

## 2017-10-19 DIAGNOSIS — K8301 Primary sclerosing cholangitis: Secondary | ICD-10-CM | POA: Diagnosis not present

## 2017-10-19 DIAGNOSIS — R978 Other abnormal tumor markers: Secondary | ICD-10-CM | POA: Diagnosis not present

## 2017-10-24 DIAGNOSIS — K8301 Primary sclerosing cholangitis: Secondary | ICD-10-CM | POA: Diagnosis not present

## 2017-10-25 DIAGNOSIS — R69 Illness, unspecified: Secondary | ICD-10-CM | POA: Diagnosis not present

## 2017-10-29 ENCOUNTER — Ambulatory Visit (INDEPENDENT_AMBULATORY_CARE_PROVIDER_SITE_OTHER): Payer: Medicare HMO | Admitting: Pharmacist Clinician (PhC)/ Clinical Pharmacy Specialist

## 2017-10-29 DIAGNOSIS — I823 Embolism and thrombosis of renal vein: Secondary | ICD-10-CM

## 2017-10-29 DIAGNOSIS — Z7901 Long term (current) use of anticoagulants: Secondary | ICD-10-CM | POA: Diagnosis not present

## 2017-10-29 LAB — POCT INR: INR: 3.2 — AB (ref 2.0–3.0)

## 2017-10-29 MED ORDER — ENOXAPARIN SODIUM 80 MG/0.8ML ~~LOC~~ SOLN: 80.0000 mg | Freq: Two times a day (BID) | SUBCUTANEOUS | 0 refills | Status: DC

## 2017-10-29 NOTE — Patient Instructions (Addendum)
July 11: Last dose of Coumadin.  July 12: No Coumadin or Lovenox.  July 13: Inject Lovenox 80 mg in the fatty abdominal tissue at least 2 inches from the belly button twice a day about 12 hours apart, 8am and 8pm rotate sites. No Coumadin.  July 14: Inject Lovenox in the fatty tissue every 12 hours, 8am and 8pm. No Coumadin.  July 15: Inject Lovenox in the fatty tissue every 12 hours, 8am and 8pm. No Coumadin.  July 16: Inject Lovenox in the fatty tissue in the morning at 8 am (No PM dose). No Coumadin.  July 17: Procedure Day - No Lovenox - Resume Coumadin 1.5 tablets in the evening or as directed by doctor   July 18: Resume Lovenox inject in the fatty tissue every 12 hours and take Coumadin 1.5 tabltes.  July 19: Inject Lovenox in the fatty tissue every 12 hours and take Coumadin 1.5 tablets.  July 20: Inject Lovenox in the fatty tissue every 12 hours and take Coumadin 1.5 tablets.  July 21: Inject Lovenox in the fatty tissue every 12 hours and take Coumadin 1.5 tablets.  July 22: Inject Lovenox in the fatty tissue every 12 hours and take Coumadin 1 tablet.  : Coumadin appt to check INR.

## 2017-10-31 ENCOUNTER — Telehealth: Payer: Self-pay | Admitting: Pharmacist Clinician (PhC)/ Clinical Pharmacy Specialist

## 2017-10-31 MED ORDER — ENOXAPARIN SODIUM 80 MG/0.8ML ~~LOC~~ SOLN: 80.0000 mg | Freq: Two times a day (BID) | SUBCUTANEOUS | 0 refills | Status: DC

## 2017-10-31 NOTE — Telephone Encounter (Signed)
Patient called to report has been scheduled for ERCP on July 25.  Will need to hold warfarin x 5 days.  He is already scheduled for colonoscopy on July 17, so will have him bridge with enoxaparin between the two procedures.  Updated bridge schedule below, and emailed to patient.  July 11: Last dose of Coumadin.  July 12: No Coumadin or Lovenox.  July 13: Inject Lovenox 80 mg in the fatty abdominal tissue at least 2 inches from the belly button twice a day about 12 hours apart, 8am and 8pm rotate sites. No Coumadin.  July 14: Inject Lovenox in the fatty tissue every 12 hours, 8am and 8pm. No Coumadin.  July 15: Inject Lovenox in the fatty tissue every 12 hours, 8am and 8pm. No Coumadin.  July 16: Inject Lovenox in the fatty tissue in the morning at 8 am (No PM dose). No Coumadin.  July 17: Procedure Day - colonoscopy - no lovenox, no Coumadin  July 18: Inject Lovenox in the fatty tissue every 12 hours, 8am and 8pm. No Coumadin.  July 19: Inject Lovenox in the fatty tissue every 12 hours, 8am and 8pm. No Coumadin.  July 20: Inject Lovenox in the fatty tissue every 12 hours, 8am and 8pm. No Coumadin.  July 21: Inject Lovenox in the fatty tissue every 12 hours, 8am and 8pm. No Coumadin.  July 22: Inject Lovenox in the fatty tissue every 12 hours, 8am and 8pm. No Coumadin.  July 23: Inject Lovenox in the fatty tissue every 12 hours, 8am and 8pm. No Coumadin.  July 24: Inject Lovenox in the fatty tissue in the morning at 8 am (No PM dose). No Coumadin.  July 25: Procedure Day - ERCP - no Lovenox.  Resume Coumadin 1.5 tablets in the evening unless otherwise directed by surgeon.  July 26: Resume Lovenox inject in the fatty tissue every 12 hours and take Coumadin 1.5 tablets.  July 27: Inject Lovenox in the fatty tissue every 12 hours and take Coumadin 1.5 tablets  July 28: Inject Lovenox in the fatty tissue every 12 hours and take Coumadin 1 5 tablets  July 29: Inject Lovenox in the  fatty tissue every 12 hours and take Coumadin 1.5 tablets.  July 30: Inject Lovenox in the fatty tissue every 12 hours and take Coumadin 1 tablet  July 31: Coumadin appt to check INR 8:45 am.

## 2017-11-07 ENCOUNTER — Ambulatory Visit: Payer: Self-pay | Admitting: Cardiology

## 2017-11-07 DIAGNOSIS — D6862 Lupus anticoagulant syndrome: Secondary | ICD-10-CM | POA: Diagnosis not present

## 2017-11-07 DIAGNOSIS — K6389 Other specified diseases of intestine: Secondary | ICD-10-CM | POA: Diagnosis not present

## 2017-11-07 DIAGNOSIS — E271 Primary adrenocortical insufficiency: Secondary | ICD-10-CM | POA: Diagnosis not present

## 2017-11-07 DIAGNOSIS — K644 Residual hemorrhoidal skin tags: Secondary | ICD-10-CM | POA: Diagnosis not present

## 2017-11-07 DIAGNOSIS — D122 Benign neoplasm of ascending colon: Secondary | ICD-10-CM | POA: Diagnosis not present

## 2017-11-07 DIAGNOSIS — K219 Gastro-esophageal reflux disease without esophagitis: Secondary | ICD-10-CM | POA: Diagnosis not present

## 2017-11-07 DIAGNOSIS — R69 Illness, unspecified: Secondary | ICD-10-CM | POA: Diagnosis not present

## 2017-11-07 DIAGNOSIS — K5730 Diverticulosis of large intestine without perforation or abscess without bleeding: Secondary | ICD-10-CM | POA: Diagnosis not present

## 2017-11-07 DIAGNOSIS — Z86010 Personal history of colonic polyps: Secondary | ICD-10-CM | POA: Diagnosis not present

## 2017-11-07 DIAGNOSIS — E039 Hypothyroidism, unspecified: Secondary | ICD-10-CM | POA: Diagnosis not present

## 2017-11-07 DIAGNOSIS — Z7984 Long term (current) use of oral hypoglycemic drugs: Secondary | ICD-10-CM | POA: Diagnosis not present

## 2017-11-07 DIAGNOSIS — K635 Polyp of colon: Secondary | ICD-10-CM | POA: Diagnosis not present

## 2017-11-07 DIAGNOSIS — E119 Type 2 diabetes mellitus without complications: Secondary | ICD-10-CM | POA: Diagnosis not present

## 2017-11-07 DIAGNOSIS — Z1211 Encounter for screening for malignant neoplasm of colon: Secondary | ICD-10-CM | POA: Diagnosis not present

## 2017-11-09 ENCOUNTER — Encounter: Payer: Self-pay | Admitting: Cardiology

## 2017-11-09 ENCOUNTER — Ambulatory Visit: Payer: Medicare HMO | Admitting: Cardiology

## 2017-11-09 VITALS — BP 136/80 | HR 76 | Ht 69.0 in | Wt 191.5 lb

## 2017-11-09 DIAGNOSIS — I10 Essential (primary) hypertension: Secondary | ICD-10-CM | POA: Diagnosis not present

## 2017-11-09 DIAGNOSIS — E785 Hyperlipidemia, unspecified: Secondary | ICD-10-CM | POA: Diagnosis not present

## 2017-11-09 DIAGNOSIS — Z8249 Family history of ischemic heart disease and other diseases of the circulatory system: Secondary | ICD-10-CM

## 2017-11-09 DIAGNOSIS — I823 Embolism and thrombosis of renal vein: Secondary | ICD-10-CM

## 2017-11-09 NOTE — Progress Notes (Signed)
PCP: Kenneth Frizzle, MD  Clinic Note: Chief Complaint  Patient presents with  . Follow-up    No CV complaints    HPI: Kenneth Cline is a 62 y.o. male with a PMH below who presents today for annual follow-up with family history of CAD. 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 He also has a history of choledocholithiasis with recurrent cholangitis.  - He had ERCP in February 2018 He has been evaluated so far with a treadmill nuclear stress test in 2008 there was no ischemic. He had a CPET-MET test done when I first met him in 2013. That was also relatively normal.   Kenneth Cline was last seen On October 02, 2016.  Doing well from a cardiac standpoint.  Was recovering from an episode of intermittent hospitalizations at the [facility] and then [facility].  Recent Hospitalizations: None since last visit  Studies Personally Reviewed - (if available, images/films reviewed: From Epic Chart or Care Everywhere)  None  Interval History: [name] returns today overall doing quite well.  He has managed to be out of the hospital for entire year and is quite happy about that, but is scheduled for possible redo ERCP next week.  He says he has occasional palpitations off and on but nothing the last long time.  He is staying active, but is not really doing any routine exercise.  He has been pretty stable from a cardiac standpoint with no major cardiac symptoms.   Cardiovascular review of symptoms: No chest pain or shortness of breath with rest or exertion. No PND, orthopnea or edema. No dizziness, weakness, syncope/near syncope, or TIA/amaurosis fugax symptoms. No claudication.  ROS: A comprehensive was performed. Review of Systems  Constitutional: Negative for malaise/fatigue and weight loss.  HENT: Negative for nosebleeds.   Respiratory: Negative for cough, shortness of breath and  wheezing.   Gastrointestinal: Positive for abdominal pain. Negative for blood in stool, constipation and melena.       Starting to have more abdominal cramping pain -- has pending ERCP coming up @ [facility]  Genitourinary: Negative for hematuria.  Musculoskeletal: Negative for joint pain and myalgias.       Still gets cramps with dehydration.  Neurological: Negative for dizziness and focal weakness.  Psychiatric/Behavioral: The patient is not nervous/anxious.   All other systems reviewed and are negative.  I have reviewed and (if needed) personally updated the patient's problem list, medications, allergies, past medical and surgical history, social and family history.   Past Medical History:  Diagnosis Date  . Adrenal hemorrhage [facility]) April 2016   Following shoulder surgery  . Adrenal insufficiency, primary, hemorrhagic [facility]) April 2016  . CVA (cerebral infarction) January 2017   3 small areas of infarct in both the cerebrum as well as the cerebellum -- , located with diplopia  . 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 Dyslipidemia   . Erectile dysfunction   . Essential hypertension   . Family history of premature coronary artery disease    2 brothers, one sister and father all with CAD.  One sister with valve disease.  Marland Kitchen GERD (gastroesophageal reflux disease)   . H/O acute cholangitis May 3875   Complicated by septic shock, acute pancreatitis and acute choledocholithiasis;   . Lupus anticoagulant with hemorrhagic disorder (HCC)    Previously on Xarelto. Now on warfarin  . Overweight (BMI 25.0-29.9)   .  PSC (primary sclerosing cholangitis)    managed by Dr. Koleen Nimrod at [facility]  . Recurrent cholangitis 7/'16, 10/'16, 11/'16 & 2/'17   Initial ERCP with biliary stent placement followed by cholecystectomy forr cholecystitis- seen at [facility] and [place] felt to be [place]  . Septic shock due to Escherichia coli [facility]) October 2016; January 2017   Escherichia coli bacteremia  secondary to recurrent cholangitis -- status post ERCP  . Stevens-Johnson disease ([place]) 04/08/2013    Reaction to Biaxin  . 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 Type 2 diabetes mellitus [facility]) February 2017    Past Surgical History:  Procedure Laterality Date  . CPET/MET  11/27/2011   normal PFT, good effort-no ischemia burden  . ERCP W/ METAL STENT PLACEMENT  10/'16; 11/'16   [facility] - stent removal  . ERCP W/ METAL STENT PLACEMENT  06/01/2015   [place]. Likely cholangitis  . ERCP W/ PLASTIC STENT PLACEMENT  Sep 09 2014   [facility] - Dr. Alvera Novel  . ERCP W/ SPHINCTEROTOMY AND BALLOON DILATION  11/10/2014   [facility] -- Removal of biliary stent,, to PACU duodenitis  . LAPAROSCOPIC CHOLECYSTECTOMY W/ CHOLANGIOGRAPHY  November 13 2014   [facility], Dr. Devota Pace  . NM MYOCAR PERF WALL MOTION  06/28/2006   EF 23% , LV systolic fx norm.  Marland Kitchen ROTATOR CUFF REPAIR    . TRANSTHORACIC ECHOCARDIOGRAM  04/2015   No pericardial effusion. No shunt. Full study: Basal septal LVH. Pseudo-normal filling (GR 2 DD). EF 57%.  . TRANSTHORACIC ECHOCARDIOGRAM  08/2015   [facility]): Normal LV size. Basal septal hypertrophy. EF 57%. Pseudo-normal filling. Mild focal aortic valve thickening. (Study was done to evaluate for possible endocarditis)    Current Meds  Medication Sig  . aspirin EC 81 MG tablet Take 81 mg by mouth as directed.  . Calcium Carb-Cholecalciferol (CALCIUM + VITAMIN D3 PO) Take by mouth as directed.  . enoxaparin (LOVENOX) 80 MG/0.8ML injection Inject 0.8 mLs (80 mg total) into the skin every 12 (twelve) hours. As directed  . ezetimibe (ZETIA) 10 MG tablet TAKE 1 TABLET (10 MG TOTAL) BY MOUTH DAILY. NEED OV.  . ferrous sulfate 325 (65 FE) MG tablet Take 325 mg by mouth daily with breakfast.  . fludrocortisone (FLORINEF) 0.1 MG tablet Take 0.1 mg by mouth daily.  Marland Kitchen gabapentin (NEURONTIN) 300 MG capsule  TAKE 1 CAPSULE THREE TIMES A DAY FOR 30 DAYS. MAY INCREASE TO UP TO 6 CAPSULES A DAY  . HYDROcodone-acetaminophen (NORCO) 5-325 MG tablet Take 1 tablet by mouth every 6 (six) hours as needed for moderate pain.  Marland Kitchen ipratropium (ATROVENT) 0.06 % nasal spray 1-2 SPRAYS IN NOSTRILS 3 (THREE) TIMES DAILY AS NEEDED FOR RHINITIS (RUNNY NOSE/POST NASAL DRIP).  Marland Kitchen JANUVIA 100 MG tablet TAKE 1 TABLET BY MOUTH EVERY DAY  . levothyroxine (SYNTHROID, LEVOTHROID) 75 MCG tablet Take 37.5 mcg by mouth daily.  . magnesium oxide (MAG-OX) 400 MG tablet Take 400 mg by mouth daily.  . mometasone (NASONEX) 50 MCG/ACT nasal spray Place 2 sprays into the nose daily.  . pantoprazole (PROTONIX) 40 MG tablet Take 1 tablet (40 mg total) by mouth 2 (two) times daily.  . predniSONE (DELTASONE) 5 MG tablet Take 5 mg by mouth as directed.  . Probiotic Product (PROBIOTIC DAILY PO) Take by mouth.  . sertraline (ZOLOFT) 50 MG tablet TAKE 1 TABLET BY MOUTH EVERY DAY  . traMADol (  ULTRAM) 50 MG tablet Take 1 tablet (50 mg total) by mouth every 12 (twelve) hours as needed for moderate pain.  . ursodiol (ACTIGALL) 500 MG tablet Take 500 mg by mouth 2 (two) times daily.  Marland Kitchen warfarin (COUMADIN) 5 MG tablet TAKE 1 TO 1.5 TABLETS BY MOUTH DAILY AS DIRECTED BY COUMADIN CLINIC    Allergies  Allergen Reactions  . Biaxin [Clarithromycin] Rash    SJS  . Keflex [Cephalexin] Hives, Itching and Rash  . Erythromycin     Was told not to take d/t medical problems  . Macrolides And Ketolides Other (See Comments)    Unknown  . Penicillins     REACTION: rash    Social History   Tobacco Use  . Smoking status: Never Smoker  . Smokeless tobacco: Never Used  Substance Use Topics  . Alcohol use: Yes    Alcohol/week: 1.8 oz    Types: 3 Standard drinks or equivalent per week    Comment: occassional  . Drug use: No   Social History   Social History Narrative   He is a married father of 2 with 3 stepchildren.  He has 3 grandchildren his own  and 3 step grandchildren.  He works as a Dealer for [employer].  He lives with his wife, [name], of 8 years.  He does not, and never did smoke.  He takes occasional alcohol beverage but nothing significant.  He does not do routine exercise, but does walk a lot at work.    family history includes Heart attack (age of onset: 25) in his brother; Heart attack (age of onset: 72) in his father; Heart disease (age of onset: 33) in his sister; Heart disease (age of onset: 41) in his brother; Heart disease (age of onset: 34) in his sister; Heart failure in his sister; Hypertension in his brother, brother, sister, and sister; Ovarian cancer in his maternal grandmother; Sudden death in his brother; Sudden death (age of onset: 63) in his father.  Wt Readings from Last 3 Encounters:  11/09/17 191 lb 8 oz (86.9 kg)  09/06/17 188 lb (85.3 kg)  04/30/17 191 lb (86.6 kg)    PHYSICAL EXAM BP 136/80   Pulse 76   Ht 5' 9"  (1.753 m)   Wt 191 lb 8 oz (86.9 kg)   SpO2 97%   BMI 28.28 kg/m  Physical Exam  Constitutional: He is oriented to person, place, and time. He appears well-developed and well-nourished. No distress.  Healthy-appearing  HENT:  Head: Normocephalic and atraumatic.  Neck: Normal range of motion. Neck supple. No JVD present.  Cardiovascular: Normal rate, regular rhythm, normal heart sounds and intact distal pulses.  No extrasystoles are present. PMI is not displaced. Exam reveals no gallop and no friction rub.  No murmur heard. Pulmonary/Chest: Effort normal and breath sounds normal. No respiratory distress. He has no rales.  Abdominal: Soft. Bowel sounds are normal. There is no tenderness.  Musculoskeletal: Normal range of motion. He exhibits no edema.  Lymphadenopathy:    He has no cervical adenopathy.  Neurological: He is alert and oriented to person, place, and time.  Psychiatric: He has a normal mood and affect. His behavior is normal. Judgment and thought content normal.  Vitals  reviewed.   Adult ECG Report  Rate: 77 ;  Rhythm: normal sinus rhythm and premature ventricular contractions (PVC);   Narrative Interpretation: PVC new (not felt)   Other studies Reviewed: Additional studies/ records that were reviewed today include:  Recent Labs:  Lab Results  Component Value Date   CHOL 226 (H) 04/30/2017   HDL 49 04/30/2017   LDLCALC 144 (H) 04/30/2017   TRIG 191 (H) 04/30/2017   CHOLHDL 4.6 04/30/2017   Lab Results  Component Value Date   CREATININE 1.04 04/30/2017   BUN 27 (H) 04/30/2017   NA 138 04/30/2017   K 4.0 04/30/2017   CL 104 04/30/2017   CO2 23 04/30/2017   Lab Results  Component Value Date   WBC 8.7 04/30/2017   HGB 15.4 04/30/2017   HCT 45.4 04/30/2017   MCV 85.3 04/30/2017   PLT 261 04/30/2017     ASSESSMENT / PLAN: Problem List Items Addressed This Visit    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  He is on chronic warfarin therapy with PRN Lovenox for bridging.      Relevant Medications   aspirin EC 81 MG tablet   Other Relevant Orders   EKG 12-Lead   Hypertension, essential, benign (Chronic)    Blood pressure looks upper limit of normal but still okay and remains off any medications.    With his recurrent bouts of cholangitis and hospitalizations I think is probably not unreasonable to allow him to have blood pressure reserve.      Relevant Medications   aspirin EC 81 MG tablet   Other Relevant Orders   EKG 12-Lead   Family history of premature coronary artery disease (Chronic)    With his family history and hypertension, hyperlipidemia and diabetes, we do need to be more careful with his risk factors and I think would like to see a way of getting his lipids controlled.    we just need to figure out a treatment option that does not interact poorly with his cholangitis issues.  He remains active with no current symptoms.  No indication for stress test at this time.       Relevant Orders   EKG 12-Lead   Dyslipidemia - Primary (Chronic)    Last labs were from January.  His LDL is a little bit high would like for it to be close to 100.  We be reluctant to use statins because of his liver/cholangitis issues.  I am not sure why his Zetia was stopped.  I have asked him to discuss this with his GI doctors when he sees him next.:   I wonder if he would potentially benefit from Questran --> otherwise he may need to consider some of the newer agents        Relevant Orders   EKG 12-Lead       Current medicines are reviewed at length with the patient today.  (+/- concerns) n/a The following changes have been made:  n/a  Patient Instructions  Dr Ellyn Hack recommends that you schedule a follow-up appointment in 12 months. You will receive a reminder letter in the mail two months in advance. If you don't receive a letter, please call our office to schedule the follow-up appointment.  If you need a refill on your cardiac medications before your next appointment, please call your pharmacy.    Studies Ordered:   Orders Placed This Encounter  Procedures  . EKG 12-Lead      Glenetta Hew, M.D., M.S. Interventional Cardiologist   Pager # [phone] Phone # [phone] [address] [place], [place] [address]   Thank you for choosing [facility] at [facility]!!

## 2017-11-09 NOTE — Patient Instructions (Signed)
Dr Ellyn Hack recommends that you schedule a follow-up appointment in 12 months. You will receive a reminder letter in the mail two months in advance. If you don't receive a letter, please call our office to schedule the follow-up appointment.  If you need a refill on your cardiac medications before your next appointment, please call your pharmacy.

## 2017-11-11 ENCOUNTER — Encounter: Payer: Self-pay | Admitting: Cardiology

## 2017-11-11 NOTE — Assessment & Plan Note (Signed)
With his family history and hypertension, hyperlipidemia and diabetes, we do need to be more careful with his risk factors and I think would like to see a way of getting his lipids controlled.    we just need to figure out a treatment option that does not interact poorly with his cholangitis issues.  He remains active with no current symptoms.  No indication for stress test at this time.

## 2017-11-11 NOTE — Assessment & Plan Note (Signed)
Blood pressure looks upper limit of normal but still okay and remains off any medications.    With his recurrent bouts of cholangitis and hospitalizations I think is probably not unreasonable to allow him to have blood pressure reserve.

## 2017-11-11 NOTE — Assessment & Plan Note (Signed)
Related to lupus anticoagulant.  He is on chronic warfarin therapy with PRN Lovenox for bridging.

## 2017-11-11 NOTE — Assessment & Plan Note (Signed)
Last labs were from January.  His LDL is a little bit high would like for it to be close to 100.  We be reluctant to use statins because of his liver/cholangitis issues.  I am not sure why his Zetia was stopped.  I have asked him to discuss this with his GI doctors when he sees him next.:   I wonder if he would potentially benefit from Questran --> otherwise he may need to consider some of the newer agents

## 2017-11-15 DIAGNOSIS — G8918 Other acute postprocedural pain: Secondary | ICD-10-CM | POA: Diagnosis not present

## 2017-11-15 DIAGNOSIS — Z87891 Personal history of nicotine dependence: Secondary | ICD-10-CM | POA: Diagnosis not present

## 2017-11-15 DIAGNOSIS — E1140 Type 2 diabetes mellitus with diabetic neuropathy, unspecified: Secondary | ICD-10-CM | POA: Diagnosis not present

## 2017-11-15 DIAGNOSIS — I10 Essential (primary) hypertension: Secondary | ICD-10-CM | POA: Diagnosis not present

## 2017-11-15 DIAGNOSIS — K8301 Primary sclerosing cholangitis: Secondary | ICD-10-CM | POA: Diagnosis not present

## 2017-11-15 DIAGNOSIS — M329 Systemic lupus erythematosus, unspecified: Secondary | ICD-10-CM | POA: Diagnosis not present

## 2017-11-15 DIAGNOSIS — E063 Autoimmune thyroiditis: Secondary | ICD-10-CM | POA: Diagnosis not present

## 2017-11-15 DIAGNOSIS — Z4689 Encounter for fitting and adjustment of other specified devices: Secondary | ICD-10-CM | POA: Diagnosis not present

## 2017-11-15 DIAGNOSIS — K831 Obstruction of bile duct: Secondary | ICD-10-CM | POA: Diagnosis not present

## 2017-11-15 DIAGNOSIS — D6862 Lupus anticoagulant syndrome: Secondary | ICD-10-CM | POA: Diagnosis not present

## 2017-11-15 DIAGNOSIS — Z9049 Acquired absence of other specified parts of digestive tract: Secondary | ICD-10-CM | POA: Diagnosis not present

## 2017-11-15 DIAGNOSIS — E039 Hypothyroidism, unspecified: Secondary | ICD-10-CM | POA: Diagnosis not present

## 2017-11-15 DIAGNOSIS — E2740 Unspecified adrenocortical insufficiency: Secondary | ICD-10-CM | POA: Diagnosis not present

## 2017-11-15 DIAGNOSIS — R978 Other abnormal tumor markers: Secondary | ICD-10-CM | POA: Diagnosis not present

## 2017-11-15 DIAGNOSIS — R748 Abnormal levels of other serum enzymes: Secondary | ICD-10-CM | POA: Diagnosis not present

## 2017-11-15 DIAGNOSIS — E271 Primary adrenocortical insufficiency: Secondary | ICD-10-CM | POA: Diagnosis not present

## 2017-11-15 DIAGNOSIS — E119 Type 2 diabetes mellitus without complications: Secondary | ICD-10-CM | POA: Diagnosis not present

## 2017-11-15 DIAGNOSIS — Z794 Long term (current) use of insulin: Secondary | ICD-10-CM | POA: Diagnosis not present

## 2017-11-15 DIAGNOSIS — Z9889 Other specified postprocedural states: Secondary | ICD-10-CM | POA: Diagnosis not present

## 2017-11-15 DIAGNOSIS — R109 Unspecified abdominal pain: Secondary | ICD-10-CM | POA: Diagnosis not present

## 2017-11-16 DIAGNOSIS — E2740 Unspecified adrenocortical insufficiency: Secondary | ICD-10-CM | POA: Diagnosis not present

## 2017-11-16 DIAGNOSIS — D6861 Antiphospholipid syndrome: Secondary | ICD-10-CM | POA: Diagnosis not present

## 2017-11-16 DIAGNOSIS — Z9889 Other specified postprocedural states: Secondary | ICD-10-CM | POA: Diagnosis not present

## 2017-11-16 DIAGNOSIS — E1140 Type 2 diabetes mellitus with diabetic neuropathy, unspecified: Secondary | ICD-10-CM | POA: Diagnosis not present

## 2017-11-16 DIAGNOSIS — E063 Autoimmune thyroiditis: Secondary | ICD-10-CM | POA: Diagnosis not present

## 2017-11-16 DIAGNOSIS — K8301 Primary sclerosing cholangitis: Secondary | ICD-10-CM | POA: Diagnosis not present

## 2017-11-16 DIAGNOSIS — K838 Other specified diseases of biliary tract: Secondary | ICD-10-CM | POA: Diagnosis not present

## 2017-11-16 DIAGNOSIS — K219 Gastro-esophageal reflux disease without esophagitis: Secondary | ICD-10-CM | POA: Diagnosis not present

## 2017-11-16 DIAGNOSIS — K831 Obstruction of bile duct: Secondary | ICD-10-CM | POA: Diagnosis not present

## 2017-11-16 DIAGNOSIS — R768 Other specified abnormal immunological findings in serum: Secondary | ICD-10-CM | POA: Diagnosis not present

## 2017-11-16 DIAGNOSIS — Z9689 Presence of other specified functional implants: Secondary | ICD-10-CM | POA: Diagnosis not present

## 2017-11-16 DIAGNOSIS — E038 Other specified hypothyroidism: Secondary | ICD-10-CM | POA: Diagnosis not present

## 2017-11-21 ENCOUNTER — Ambulatory Visit: Payer: Medicare HMO | Admitting: Pharmacist

## 2017-11-21 ENCOUNTER — Other Ambulatory Visit: Payer: Self-pay | Admitting: Cardiology

## 2017-11-21 DIAGNOSIS — Z7901 Long term (current) use of anticoagulants: Secondary | ICD-10-CM

## 2017-11-21 DIAGNOSIS — I823 Embolism and thrombosis of renal vein: Secondary | ICD-10-CM | POA: Diagnosis not present

## 2017-11-21 LAB — POCT INR: INR: 1.1 — AB (ref 2.0–3.0)

## 2017-11-21 MED ORDER — ENOXAPARIN SODIUM 80 MG/0.8ML ~~LOC~~ SOLN: 80.0000 mg | Freq: Two times a day (BID) | SUBCUTANEOUS | 0 refills | Status: DC

## 2017-11-22 ENCOUNTER — Other Ambulatory Visit: Payer: Self-pay | Admitting: Pharmacist

## 2017-11-22 MED ORDER — ENOXAPARIN SODIUM 80 MG/0.8ML ~~LOC~~ SOLN: 80.0000 mg | Freq: Two times a day (BID) | SUBCUTANEOUS | 0 refills | Status: DC

## 2017-11-26 ENCOUNTER — Ambulatory Visit (INDEPENDENT_AMBULATORY_CARE_PROVIDER_SITE_OTHER): Payer: Medicare HMO | Admitting: Pharmacist

## 2017-11-26 DIAGNOSIS — Z7901 Long term (current) use of anticoagulants: Secondary | ICD-10-CM

## 2017-11-26 DIAGNOSIS — I823 Embolism and thrombosis of renal vein: Secondary | ICD-10-CM

## 2017-11-26 LAB — POCT INR: INR: 1.7 — AB (ref 2.0–3.0)

## 2017-12-03 DIAGNOSIS — K8301 Primary sclerosing cholangitis: Secondary | ICD-10-CM | POA: Diagnosis not present

## 2017-12-26 ENCOUNTER — Ambulatory Visit: Payer: Medicare HMO | Admitting: Pharmacist

## 2017-12-26 DIAGNOSIS — I823 Embolism and thrombosis of renal vein: Secondary | ICD-10-CM

## 2017-12-26 DIAGNOSIS — Z7901 Long term (current) use of anticoagulants: Secondary | ICD-10-CM

## 2017-12-26 LAB — POCT INR: INR: 2.6 (ref 2.0–3.0)

## 2017-12-26 NOTE — Patient Instructions (Addendum)
01/04/18: Last dose of Coumadin.  01/05/18: No Coumadin or Lovenox.  01/06/18: Inject Lovenox 80mg  in the fatty abdominal tissue at least 2 inches from the belly button twice a day about 12 hours apart, 8am and 8pm rotate sites. No Coumadin.  01/07/18: Inject Lovenox in the fatty tissue every 12 hours, 8am and 8pm. No Coumadin.  01/08/18: Inject Lovenox in the fatty tissue every 12 hours, 8am and 8pm. No Coumadin.  01/09/18: Inject Lovenox in the fatty tissue in the morning at 8 am (No PM dose). No Coumadin.  01/10/18: Procedure Day - No Lovenox - Resume Coumadin in the evening or as directed by doctor .  9/20: Resume Lovenox inject in the fatty tissue every 12 hours and take Coumadin 10mg .  9/21: Inject Lovenox in the fatty tissue every 12 hours and take Coumadin 10mg .  9/22: Inject Lovenox in the fatty tissue every 12 hours and take Coumadin 7.5mg .  9/23: Inject Lovenox in the fatty tissue every 12 hours and take Coumadin 7.5mg .  9/24: Inject Lovenox in the fatty tissue every 12 hours and take Coumadin 5mg .  9/25: Coumadin appt to check INR at 8:30am

## 2017-12-31 DIAGNOSIS — R69 Illness, unspecified: Secondary | ICD-10-CM | POA: Diagnosis not present

## 2018-01-03 DIAGNOSIS — K8051 Calculus of bile duct without cholangitis or cholecystitis with obstruction: Secondary | ICD-10-CM | POA: Diagnosis not present

## 2018-01-03 DIAGNOSIS — K8309 Other cholangitis: Secondary | ICD-10-CM | POA: Diagnosis not present

## 2018-01-03 DIAGNOSIS — K8301 Primary sclerosing cholangitis: Secondary | ICD-10-CM | POA: Diagnosis not present

## 2018-01-03 DIAGNOSIS — E271 Primary adrenocortical insufficiency: Secondary | ICD-10-CM | POA: Diagnosis not present

## 2018-01-03 DIAGNOSIS — E038 Other specified hypothyroidism: Secondary | ICD-10-CM | POA: Diagnosis not present

## 2018-01-03 DIAGNOSIS — E1165 Type 2 diabetes mellitus with hyperglycemia: Secondary | ICD-10-CM | POA: Diagnosis not present

## 2018-01-03 DIAGNOSIS — D126 Benign neoplasm of colon, unspecified: Secondary | ICD-10-CM | POA: Diagnosis not present

## 2018-01-03 DIAGNOSIS — E063 Autoimmune thyroiditis: Secondary | ICD-10-CM | POA: Diagnosis not present

## 2018-01-09 ENCOUNTER — Encounter: Payer: Self-pay | Admitting: Family Medicine

## 2018-01-10 DIAGNOSIS — K8301 Primary sclerosing cholangitis: Secondary | ICD-10-CM | POA: Diagnosis not present

## 2018-01-10 DIAGNOSIS — Z86718 Personal history of other venous thrombosis and embolism: Secondary | ICD-10-CM | POA: Diagnosis not present

## 2018-01-10 DIAGNOSIS — Z4689 Encounter for fitting and adjustment of other specified devices: Secondary | ICD-10-CM | POA: Diagnosis not present

## 2018-01-10 DIAGNOSIS — E2740 Unspecified adrenocortical insufficiency: Secondary | ICD-10-CM | POA: Diagnosis not present

## 2018-01-10 DIAGNOSIS — K831 Obstruction of bile duct: Secondary | ICD-10-CM | POA: Diagnosis not present

## 2018-01-10 DIAGNOSIS — D6862 Lupus anticoagulant syndrome: Secondary | ICD-10-CM | POA: Diagnosis not present

## 2018-01-10 DIAGNOSIS — Z9049 Acquired absence of other specified parts of digestive tract: Secondary | ICD-10-CM | POA: Diagnosis not present

## 2018-01-16 ENCOUNTER — Ambulatory Visit: Payer: Medicare HMO | Admitting: Pharmacist

## 2018-01-16 DIAGNOSIS — I823 Embolism and thrombosis of renal vein: Secondary | ICD-10-CM

## 2018-01-16 DIAGNOSIS — Z7901 Long term (current) use of anticoagulants: Secondary | ICD-10-CM

## 2018-01-16 LAB — POCT INR: INR: 1.7 — AB (ref 2.0–3.0)

## 2018-01-30 ENCOUNTER — Ambulatory Visit: Payer: Medicare HMO | Admitting: Pharmacist Clinician (PhC)/ Clinical Pharmacy Specialist

## 2018-01-30 DIAGNOSIS — Z7901 Long term (current) use of anticoagulants: Secondary | ICD-10-CM

## 2018-01-30 DIAGNOSIS — I823 Embolism and thrombosis of renal vein: Secondary | ICD-10-CM | POA: Diagnosis not present

## 2018-01-30 LAB — POCT INR: INR: 2.9 (ref 2.0–3.0)

## 2018-01-30 NOTE — Patient Instructions (Signed)
Description   Continue 1 tablet daily except for 1.5 tablets on Sunday, Tuesday, and Thursday. Repeat INR in 4 weeks.

## 2018-02-27 ENCOUNTER — Ambulatory Visit (INDEPENDENT_AMBULATORY_CARE_PROVIDER_SITE_OTHER): Payer: Medicare HMO | Admitting: Pharmacist

## 2018-02-27 DIAGNOSIS — I823 Embolism and thrombosis of renal vein: Secondary | ICD-10-CM | POA: Diagnosis not present

## 2018-02-27 DIAGNOSIS — Z7901 Long term (current) use of anticoagulants: Secondary | ICD-10-CM | POA: Diagnosis not present

## 2018-02-27 LAB — POCT INR: INR: 2.7 (ref 2.0–3.0)

## 2018-03-05 DIAGNOSIS — Z7984 Long term (current) use of oral hypoglycemic drugs: Secondary | ICD-10-CM | POA: Diagnosis not present

## 2018-03-05 DIAGNOSIS — H04123 Dry eye syndrome of bilateral lacrimal glands: Secondary | ICD-10-CM | POA: Diagnosis not present

## 2018-03-05 DIAGNOSIS — H40013 Open angle with borderline findings, low risk, bilateral: Secondary | ICD-10-CM | POA: Diagnosis not present

## 2018-03-05 DIAGNOSIS — E119 Type 2 diabetes mellitus without complications: Secondary | ICD-10-CM | POA: Diagnosis not present

## 2018-03-05 DIAGNOSIS — H0102B Squamous blepharitis left eye, upper and lower eyelids: Secondary | ICD-10-CM | POA: Diagnosis not present

## 2018-03-05 DIAGNOSIS — H40003 Preglaucoma, unspecified, bilateral: Secondary | ICD-10-CM | POA: Diagnosis not present

## 2018-03-05 DIAGNOSIS — H0102A Squamous blepharitis right eye, upper and lower eyelids: Secondary | ICD-10-CM | POA: Diagnosis not present

## 2018-03-05 DIAGNOSIS — H25013 Cortical age-related cataract, bilateral: Secondary | ICD-10-CM | POA: Diagnosis not present

## 2018-03-05 DIAGNOSIS — H11153 Pinguecula, bilateral: Secondary | ICD-10-CM | POA: Diagnosis not present

## 2018-03-05 DIAGNOSIS — H18413 Arcus senilis, bilateral: Secondary | ICD-10-CM | POA: Diagnosis not present

## 2018-03-09 LAB — HM DIABETES EYE EXAM

## 2018-04-05 ENCOUNTER — Other Ambulatory Visit: Payer: Self-pay | Admitting: Cardiology

## 2018-04-10 ENCOUNTER — Ambulatory Visit (INDEPENDENT_AMBULATORY_CARE_PROVIDER_SITE_OTHER): Payer: Medicare HMO | Admitting: Pharmacist

## 2018-04-10 DIAGNOSIS — Z7901 Long term (current) use of anticoagulants: Secondary | ICD-10-CM

## 2018-04-10 DIAGNOSIS — I823 Embolism and thrombosis of renal vein: Secondary | ICD-10-CM | POA: Diagnosis not present

## 2018-04-10 LAB — POCT INR: INR: 4 — AB (ref 2.0–3.0)

## 2018-04-25 ENCOUNTER — Encounter: Payer: Self-pay | Admitting: *Deleted

## 2018-05-01 ENCOUNTER — Ambulatory Visit (INDEPENDENT_AMBULATORY_CARE_PROVIDER_SITE_OTHER): Payer: Medicare HMO | Admitting: Pharmacist

## 2018-05-01 DIAGNOSIS — I823 Embolism and thrombosis of renal vein: Secondary | ICD-10-CM

## 2018-05-01 DIAGNOSIS — Z7901 Long term (current) use of anticoagulants: Secondary | ICD-10-CM

## 2018-05-01 LAB — POCT INR: INR: 3.3 — AB (ref 2.0–3.0)

## 2018-05-09 DIAGNOSIS — K8301 Primary sclerosing cholangitis: Secondary | ICD-10-CM | POA: Diagnosis not present

## 2018-05-29 ENCOUNTER — Ambulatory Visit (INDEPENDENT_AMBULATORY_CARE_PROVIDER_SITE_OTHER): Payer: Medicare HMO | Admitting: Pharmacist

## 2018-05-29 DIAGNOSIS — Z7901 Long term (current) use of anticoagulants: Secondary | ICD-10-CM | POA: Diagnosis not present

## 2018-05-29 DIAGNOSIS — I823 Embolism and thrombosis of renal vein: Secondary | ICD-10-CM

## 2018-05-29 LAB — POCT INR: INR: 3.5 — AB (ref 2.0–3.0)

## 2018-06-03 ENCOUNTER — Other Ambulatory Visit: Payer: Self-pay

## 2018-06-03 ENCOUNTER — Encounter: Payer: Self-pay | Admitting: Family Medicine

## 2018-06-03 ENCOUNTER — Ambulatory Visit (INDEPENDENT_AMBULATORY_CARE_PROVIDER_SITE_OTHER): Payer: Medicare HMO | Admitting: Family Medicine

## 2018-06-03 VITALS — BP 130/62 | HR 82 | Temp 98.1°F | Resp 14 | Ht 69.0 in | Wt 191.0 lb

## 2018-06-03 DIAGNOSIS — Z9889 Other specified postprocedural states: Secondary | ICD-10-CM

## 2018-06-03 DIAGNOSIS — M25511 Pain in right shoulder: Secondary | ICD-10-CM | POA: Diagnosis not present

## 2018-06-03 DIAGNOSIS — S40011A Contusion of right shoulder, initial encounter: Secondary | ICD-10-CM

## 2018-06-03 MED ORDER — OXYCODONE-ACETAMINOPHEN 5-325 MG PO TABS: 1.0000 | ORAL_TABLET | Freq: Four times a day (QID) | ORAL | 0 refills | Status: DC | PRN
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
## 2018-06-03 NOTE — Patient Instructions (Signed)
Referral to orthopedics 

## 2018-06-10 ENCOUNTER — Other Ambulatory Visit: Payer: Self-pay | Admitting: Cardiology

## 2018-06-10 DIAGNOSIS — M25511 Pain in right shoulder: Secondary | ICD-10-CM | POA: Diagnosis not present

## 2018-06-17 ENCOUNTER — Ambulatory Visit (INDEPENDENT_AMBULATORY_CARE_PROVIDER_SITE_OTHER): Payer: Medicare HMO | Admitting: *Deleted

## 2018-06-17 DIAGNOSIS — I823 Embolism and thrombosis of renal vein: Secondary | ICD-10-CM

## 2018-06-17 DIAGNOSIS — Z7901 Long term (current) use of anticoagulants: Secondary | ICD-10-CM

## 2018-06-17 LAB — POCT INR: INR: 2.5 (ref 2.0–3.0)

## 2018-06-17 NOTE — Patient Instructions (Signed)
Description   Continue taking  1 tablet daily except for 1.5 tablets on Tuesday, and Thursday. Repeat INR in 3 weeks.

## 2018-06-18 DIAGNOSIS — M25511 Pain in right shoulder: Secondary | ICD-10-CM | POA: Diagnosis not present

## 2018-06-20 ENCOUNTER — Other Ambulatory Visit: Payer: Self-pay | Admitting: Family Medicine

## 2018-07-10 ENCOUNTER — Ambulatory Visit (INDEPENDENT_AMBULATORY_CARE_PROVIDER_SITE_OTHER): Payer: Medicare HMO | Admitting: Pharmacist Clinician (PhC)/ Clinical Pharmacy Specialist

## 2018-07-10 ENCOUNTER — Other Ambulatory Visit: Payer: Self-pay

## 2018-07-10 DIAGNOSIS — M9905 Segmental and somatic dysfunction of pelvic region: Secondary | ICD-10-CM | POA: Diagnosis not present

## 2018-07-10 DIAGNOSIS — Z7901 Long term (current) use of anticoagulants: Secondary | ICD-10-CM

## 2018-07-10 DIAGNOSIS — M955 Acquired deformity of pelvis: Secondary | ICD-10-CM | POA: Diagnosis not present

## 2018-07-10 DIAGNOSIS — I823 Embolism and thrombosis of renal vein: Secondary | ICD-10-CM | POA: Diagnosis not present

## 2018-07-10 DIAGNOSIS — M9903 Segmental and somatic dysfunction of lumbar region: Secondary | ICD-10-CM | POA: Diagnosis not present

## 2018-07-10 DIAGNOSIS — M5136 Other intervertebral disc degeneration, lumbar region: Secondary | ICD-10-CM | POA: Diagnosis not present

## 2018-07-10 LAB — POCT INR: INR: 3.3 — AB (ref 2.0–3.0)

## 2018-07-10 NOTE — Patient Instructions (Signed)
Take only 1/2 tablet today Wednesday March 18, then decrease dose to 1 tablet daily except for 1.5 tablets on Thursdays. Repeat INR in 3 weeks.

## 2018-07-11 DIAGNOSIS — M5136 Other intervertebral disc degeneration, lumbar region: Secondary | ICD-10-CM | POA: Diagnosis not present

## 2018-07-11 DIAGNOSIS — M9903 Segmental and somatic dysfunction of lumbar region: Secondary | ICD-10-CM | POA: Diagnosis not present

## 2018-07-11 DIAGNOSIS — M955 Acquired deformity of pelvis: Secondary | ICD-10-CM | POA: Diagnosis not present

## 2018-07-11 DIAGNOSIS — M9905 Segmental and somatic dysfunction of pelvic region: Secondary | ICD-10-CM | POA: Diagnosis not present

## 2018-07-16 DIAGNOSIS — K8301 Primary sclerosing cholangitis: Secondary | ICD-10-CM | POA: Diagnosis not present

## 2018-07-16 DIAGNOSIS — M9905 Segmental and somatic dysfunction of pelvic region: Secondary | ICD-10-CM | POA: Diagnosis not present

## 2018-07-16 DIAGNOSIS — M955 Acquired deformity of pelvis: Secondary | ICD-10-CM | POA: Diagnosis not present

## 2018-07-16 DIAGNOSIS — M9903 Segmental and somatic dysfunction of lumbar region: Secondary | ICD-10-CM | POA: Diagnosis not present

## 2018-07-16 DIAGNOSIS — E1165 Type 2 diabetes mellitus with hyperglycemia: Secondary | ICD-10-CM | POA: Diagnosis not present

## 2018-07-16 DIAGNOSIS — M5136 Other intervertebral disc degeneration, lumbar region: Secondary | ICD-10-CM | POA: Diagnosis not present

## 2018-07-16 DIAGNOSIS — E039 Hypothyroidism, unspecified: Secondary | ICD-10-CM | POA: Diagnosis not present

## 2018-07-17 DIAGNOSIS — M5136 Other intervertebral disc degeneration, lumbar region: Secondary | ICD-10-CM | POA: Diagnosis not present

## 2018-07-17 DIAGNOSIS — M9903 Segmental and somatic dysfunction of lumbar region: Secondary | ICD-10-CM | POA: Diagnosis not present

## 2018-07-17 DIAGNOSIS — M9905 Segmental and somatic dysfunction of pelvic region: Secondary | ICD-10-CM | POA: Diagnosis not present

## 2018-07-17 DIAGNOSIS — M955 Acquired deformity of pelvis: Secondary | ICD-10-CM | POA: Diagnosis not present

## 2018-07-18 DIAGNOSIS — M5136 Other intervertebral disc degeneration, lumbar region: Secondary | ICD-10-CM | POA: Diagnosis not present

## 2018-07-18 DIAGNOSIS — M9905 Segmental and somatic dysfunction of pelvic region: Secondary | ICD-10-CM | POA: Diagnosis not present

## 2018-07-18 DIAGNOSIS — M955 Acquired deformity of pelvis: Secondary | ICD-10-CM | POA: Diagnosis not present

## 2018-07-18 DIAGNOSIS — M9903 Segmental and somatic dysfunction of lumbar region: Secondary | ICD-10-CM | POA: Diagnosis not present

## 2018-08-05 ENCOUNTER — Other Ambulatory Visit: Payer: Self-pay | Admitting: Family Medicine

## 2018-08-29 DIAGNOSIS — M4726 Other spondylosis with radiculopathy, lumbar region: Secondary | ICD-10-CM | POA: Diagnosis not present

## 2018-09-03 ENCOUNTER — Telehealth: Payer: Self-pay

## 2018-09-03 NOTE — Telephone Encounter (Signed)
LMOM FOR PRESCREEN  

## 2018-09-03 NOTE — Telephone Encounter (Signed)

## 2018-09-04 ENCOUNTER — Ambulatory Visit (INDEPENDENT_AMBULATORY_CARE_PROVIDER_SITE_OTHER): Payer: Medicare HMO | Admitting: *Deleted

## 2018-09-04 ENCOUNTER — Other Ambulatory Visit: Payer: Self-pay

## 2018-09-04 DIAGNOSIS — I823 Embolism and thrombosis of renal vein: Secondary | ICD-10-CM

## 2018-09-04 DIAGNOSIS — Z7901 Long term (current) use of anticoagulants: Secondary | ICD-10-CM

## 2018-09-04 LAB — POCT INR: INR: 2.2 (ref 2.0–3.0)

## 2018-09-04 NOTE — Patient Instructions (Signed)
Description   Spoke with pt and instructed to continue taking 1 tablet daily except for 1.5 tablets on Thursdays. Repeat INR in 4 weeks.

## 2018-09-10 DIAGNOSIS — M5117 Intervertebral disc disorders with radiculopathy, lumbosacral region: Secondary | ICD-10-CM | POA: Diagnosis not present

## 2018-09-23 DIAGNOSIS — L0390 Cellulitis, unspecified: Secondary | ICD-10-CM

## 2018-09-23 HISTORY — DX: Cellulitis, unspecified: L03.90

## 2018-09-26 ENCOUNTER — Telehealth: Payer: Self-pay

## 2018-09-26 DIAGNOSIS — M5416 Radiculopathy, lumbar region: Secondary | ICD-10-CM | POA: Diagnosis not present

## 2018-09-26 DIAGNOSIS — M792 Neuralgia and neuritis, unspecified: Secondary | ICD-10-CM | POA: Diagnosis not present

## 2018-09-26 DIAGNOSIS — M5126 Other intervertebral disc displacement, lumbar region: Secondary | ICD-10-CM | POA: Diagnosis not present

## 2018-09-26 MED ORDER — ENOXAPARIN SODIUM 80 MG/0.8ML ~~LOC~~ SOLN: 80.0000 mg | Freq: Two times a day (BID) | SUBCUTANEOUS | 0 refills | Status: DC

## 2018-09-26 NOTE — Telephone Encounter (Signed)
When you call this pt back he will also need a prescreen for next thursdays appt and I didn't realize he was on my list when I had him on the phone initially

## 2018-09-26 NOTE — Telephone Encounter (Signed)
Patient to have spinal injection.  Date not set.  He has done lovenox bridges multiple times and is familiar with the process.  Will email directions to patient and he will let us know when date set.   He is aware of how to fill in the dates for dosing purposes.  : Last dose of Coumadin.  : No Coumadin or Lovenox.  : Inject Lovenox 80mg  in the fatty abdominal tissue at least 2 inches from the belly button twice a day about 12 hours apart, 8am and 8pm rotate sites. No Coumadin.  : Inject Lovenox in the fatty tissue every 12 hours, 8am and 8pm. No Coumadin.  : Inject Lovenox in the fatty tissue every 12 hours, 8am and 8pm. No Coumadin.  : Inject Lovenox in the fatty tissue in the morning at 8 am (No PM dose). No Coumadin.  : Procedure Day - No Lovenox - Resume Coumadin in the evening or as directed by doctor 7.5 mg  : Resume Lovenox inject in the fatty tissue every 12 hours and take Coumadin 7.5 mg.  : Inject Lovenox in the fatty tissue every 12 hours and take Coumadin 7.5 mg.  : Inject Lovenox in the fatty tissue every 12 hours and take Coumadin 5 mg (or normal daily dose - 5 mg except 7.5 mg on Thursdays)  : Inject Lovenox in the fatty tissue every 12 hours and take Coumadin normal daily dose - 5 mg except 7.5 mg on Thursdays  : Inject Lovenox in the fatty tissue every 12 hours and take Coumadin normal daily dose - 5 mg except 7.5 mg on Thursdays  : Coumadin appt to check INR.

## 2018-09-26 NOTE — Telephone Encounter (Signed)
Pt stated needs a lovenox bridge for his upcoming spinal injection and stated he had previously spoken with kristin and wants a call back from her with further instructions

## 2018-10-02 DIAGNOSIS — D134 Benign neoplasm of liver: Secondary | ICD-10-CM | POA: Diagnosis not present

## 2018-10-02 DIAGNOSIS — K8301 Primary sclerosing cholangitis: Secondary | ICD-10-CM | POA: Diagnosis not present

## 2018-10-02 DIAGNOSIS — M5416 Radiculopathy, lumbar region: Secondary | ICD-10-CM | POA: Diagnosis not present

## 2018-10-07 ENCOUNTER — Telehealth: Payer: Self-pay

## 2018-10-07 NOTE — Telephone Encounter (Signed)
lmom for prescreen  

## 2018-10-12 DIAGNOSIS — M79604 Pain in right leg: Secondary | ICD-10-CM | POA: Diagnosis not present

## 2018-10-12 DIAGNOSIS — J9600 Acute respiratory failure, unspecified whether with hypoxia or hypercapnia: Secondary | ICD-10-CM | POA: Diagnosis not present

## 2018-10-12 DIAGNOSIS — J189 Pneumonia, unspecified organism: Secondary | ICD-10-CM | POA: Diagnosis not present

## 2018-10-12 DIAGNOSIS — M7989 Other specified soft tissue disorders: Secondary | ICD-10-CM | POA: Diagnosis not present

## 2018-10-12 DIAGNOSIS — R918 Other nonspecific abnormal finding of lung field: Secondary | ICD-10-CM | POA: Diagnosis not present

## 2018-10-12 DIAGNOSIS — L0390 Cellulitis, unspecified: Secondary | ICD-10-CM | POA: Diagnosis not present

## 2018-10-12 DIAGNOSIS — R6521 Severe sepsis with septic shock: Secondary | ICD-10-CM | POA: Diagnosis not present

## 2018-10-12 DIAGNOSIS — M79A21 Nontraumatic compartment syndrome of right lower extremity: Secondary | ICD-10-CM | POA: Diagnosis not present

## 2018-10-12 DIAGNOSIS — E876 Hypokalemia: Secondary | ICD-10-CM | POA: Diagnosis not present

## 2018-10-12 DIAGNOSIS — L02415 Cutaneous abscess of right lower limb: Secondary | ICD-10-CM | POA: Diagnosis not present

## 2018-10-12 DIAGNOSIS — Z03818 Encounter for observation for suspected exposure to other biological agents ruled out: Secondary | ICD-10-CM | POA: Diagnosis not present

## 2018-10-12 DIAGNOSIS — I959 Hypotension, unspecified: Secondary | ICD-10-CM | POA: Diagnosis not present

## 2018-10-12 DIAGNOSIS — A4101 Sepsis due to Methicillin susceptible Staphylococcus aureus: Secondary | ICD-10-CM | POA: Diagnosis not present

## 2018-10-12 DIAGNOSIS — E271 Primary adrenocortical insufficiency: Secondary | ICD-10-CM | POA: Diagnosis not present

## 2018-10-12 DIAGNOSIS — L03115 Cellulitis of right lower limb: Secondary | ICD-10-CM | POA: Diagnosis not present

## 2018-10-12 DIAGNOSIS — G9341 Metabolic encephalopathy: Secondary | ICD-10-CM | POA: Diagnosis not present

## 2018-10-12 DIAGNOSIS — N179 Acute kidney failure, unspecified: Secondary | ICD-10-CM | POA: Diagnosis not present

## 2018-10-12 DIAGNOSIS — T8189XA Other complications of procedures, not elsewhere classified, initial encounter: Secondary | ICD-10-CM | POA: Diagnosis not present

## 2018-10-12 DIAGNOSIS — E2740 Unspecified adrenocortical insufficiency: Secondary | ICD-10-CM | POA: Diagnosis not present

## 2018-10-12 DIAGNOSIS — Z4682 Encounter for fitting and adjustment of non-vascular catheter: Secondary | ICD-10-CM | POA: Diagnosis not present

## 2018-10-12 DIAGNOSIS — J9601 Acute respiratory failure with hypoxia: Secondary | ICD-10-CM | POA: Diagnosis not present

## 2018-10-12 DIAGNOSIS — R509 Fever, unspecified: Secondary | ICD-10-CM | POA: Diagnosis not present

## 2018-10-12 DIAGNOSIS — A419 Sepsis, unspecified organism: Secondary | ICD-10-CM | POA: Diagnosis not present

## 2018-10-12 DIAGNOSIS — M1711 Unilateral primary osteoarthritis, right knee: Secondary | ICD-10-CM | POA: Diagnosis not present

## 2018-10-12 DIAGNOSIS — R600 Localized edema: Secondary | ICD-10-CM | POA: Diagnosis not present

## 2018-10-12 DIAGNOSIS — B9689 Other specified bacterial agents as the cause of diseases classified elsewhere: Secondary | ICD-10-CM | POA: Diagnosis not present

## 2018-10-12 DIAGNOSIS — Z452 Encounter for adjustment and management of vascular access device: Secondary | ICD-10-CM | POA: Diagnosis not present

## 2018-10-12 DIAGNOSIS — E272 Addisonian crisis: Secondary | ICD-10-CM | POA: Diagnosis not present

## 2018-10-12 DIAGNOSIS — R4182 Altered mental status, unspecified: Secondary | ICD-10-CM | POA: Diagnosis not present

## 2018-10-12 DIAGNOSIS — T79A21A Traumatic compartment syndrome of right lower extremity, initial encounter: Secondary | ICD-10-CM | POA: Diagnosis not present

## 2018-10-12 DIAGNOSIS — D6862 Lupus anticoagulant syndrome: Secondary | ICD-10-CM | POA: Diagnosis not present

## 2018-10-12 DIAGNOSIS — J181 Lobar pneumonia, unspecified organism: Secondary | ICD-10-CM | POA: Diagnosis not present

## 2018-10-21 ENCOUNTER — Other Ambulatory Visit: Payer: Self-pay | Admitting: Family Medicine

## 2018-10-22 MED ORDER — PREDNISONE 20 MG PO TABS
20.00 | ORAL_TABLET | ORAL | Status: DC
Start: 2018-10-23 — End: 2018-10-22

## 2018-10-22 MED ORDER — EZETIMIBE 10 MG PO TABS
10.00 | ORAL_TABLET | ORAL | Status: DC
Start: 2018-10-23 — End: 2018-10-22

## 2018-10-22 MED ORDER — GENERIC EXTERNAL MEDICATION
Status: DC
Start: ? — End: 2018-10-22

## 2018-10-22 MED ORDER — PANTOPRAZOLE SODIUM 40 MG PO TBEC
40.00 | DELAYED_RELEASE_TABLET | ORAL | Status: DC
Start: 2018-10-23 — End: 2018-10-22

## 2018-10-22 MED ORDER — GENERIC EXTERNAL MEDICATION
500.00 | Status: DC
Start: ? — End: 2018-10-22

## 2018-10-22 MED ORDER — LEVOTHYROXINE SODIUM 75 MCG PO TABS
37.50 | ORAL_TABLET | ORAL | Status: DC
Start: 2018-10-23 — End: 2018-10-22

## 2018-10-22 MED ORDER — POLYETHYLENE GLYCOL 3350 17 G PO PACK
17.00 | PACK | ORAL | Status: DC
Start: ? — End: 2018-10-22

## 2018-10-22 MED ORDER — BISACODYL 10 MG RE SUPP
10.00 | RECTAL | Status: DC
Start: ? — End: 2018-10-22

## 2018-10-22 MED ORDER — DOXYCYCLINE HYCLATE 100 MG PO TABS
100.00 | ORAL_TABLET | ORAL | Status: DC
Start: 2018-10-22 — End: 2018-10-22

## 2018-10-22 MED ORDER — SENNOSIDES-DOCUSATE SODIUM 8.6-50 MG PO TABS
1.00 | ORAL_TABLET | ORAL | Status: DC
Start: ? — End: 2018-10-22

## 2018-10-22 MED ORDER — METFORMIN HCL ER 500 MG PO TB24
500.00 | ORAL_TABLET | ORAL | Status: DC
Start: 2018-10-22 — End: 2018-10-22

## 2018-10-22 MED ORDER — URSODIOL 500 MG PO TABS
500.00 | ORAL_TABLET | ORAL | Status: DC
Start: 2018-10-22 — End: 2018-10-22

## 2018-10-22 MED ORDER — MAGNESIUM HYDROXIDE 2400 MG/10ML PO SUSP
10.00 | ORAL | Status: DC
Start: ? — End: 2018-10-22

## 2018-10-22 MED ORDER — CETIRIZINE HCL 10 MG PO TABS
10.00 | ORAL_TABLET | ORAL | Status: DC
Start: 2018-10-23 — End: 2018-10-22

## 2018-10-22 MED ORDER — INSULIN LISPRO (1 UNIT DIAL) 100 UNIT/ML (KWIKPEN)
0.00 | PEN_INJECTOR | SUBCUTANEOUS | Status: DC
Start: 2018-10-22 — End: 2018-10-22

## 2018-10-22 MED ORDER — HYDROMORPHONE HCL 2 MG/ML IJ SOLN
.50 | INTRAMUSCULAR | Status: DC
Start: ? — End: 2018-10-22

## 2018-10-22 MED ORDER — ENOXAPARIN SODIUM 100 MG/ML ~~LOC~~ SOLN
1.00 | SUBCUTANEOUS | Status: DC
Start: 2018-10-22 — End: 2018-10-22

## 2018-10-22 MED ORDER — POTASSIUM CHLORIDE CRYS ER 20 MEQ PO TBCR
40.00 | EXTENDED_RELEASE_TABLET | ORAL | Status: DC
Start: 2018-10-22 — End: 2018-10-22

## 2018-10-22 MED ORDER — FLUDROCORTISONE ACETATE 0.1 MG PO TABS
100.00 | ORAL_TABLET | ORAL | Status: DC
Start: 2018-10-23 — End: 2018-10-22

## 2018-10-22 MED ORDER — MELATONIN 3 MG PO TABS
3.00 | ORAL_TABLET | ORAL | Status: DC
Start: ? — End: 2018-10-22

## 2018-10-22 MED ORDER — CLONAZEPAM 0.5 MG PO TABS
.50 | ORAL_TABLET | ORAL | Status: DC
Start: ? — End: 2018-10-22

## 2018-10-22 MED ORDER — LACTULOSE 10 GM/15ML PO SOLN
20.00 | ORAL | Status: DC
Start: ? — End: 2018-10-22

## 2018-10-23 ENCOUNTER — Ambulatory Visit (INDEPENDENT_AMBULATORY_CARE_PROVIDER_SITE_OTHER): Payer: Medicare HMO | Admitting: Cardiovascular Disease

## 2018-10-23 DIAGNOSIS — E039 Hypothyroidism, unspecified: Secondary | ICD-10-CM | POA: Diagnosis not present

## 2018-10-23 DIAGNOSIS — K8301 Primary sclerosing cholangitis: Secondary | ICD-10-CM | POA: Diagnosis not present

## 2018-10-23 DIAGNOSIS — E272 Addisonian crisis: Secondary | ICD-10-CM | POA: Diagnosis not present

## 2018-10-23 DIAGNOSIS — L511 Stevens-Johnson syndrome: Secondary | ICD-10-CM | POA: Diagnosis not present

## 2018-10-23 DIAGNOSIS — I823 Embolism and thrombosis of renal vein: Secondary | ICD-10-CM | POA: Diagnosis not present

## 2018-10-23 DIAGNOSIS — E1165 Type 2 diabetes mellitus with hyperglycemia: Secondary | ICD-10-CM | POA: Diagnosis not present

## 2018-10-23 DIAGNOSIS — L03115 Cellulitis of right lower limb: Secondary | ICD-10-CM | POA: Diagnosis not present

## 2018-10-23 DIAGNOSIS — Z7901 Long term (current) use of anticoagulants: Secondary | ICD-10-CM

## 2018-10-23 DIAGNOSIS — D6862 Lupus anticoagulant syndrome: Secondary | ICD-10-CM | POA: Diagnosis not present

## 2018-10-23 DIAGNOSIS — B9561 Methicillin susceptible Staphylococcus aureus infection as the cause of diseases classified elsewhere: Secondary | ICD-10-CM | POA: Diagnosis not present

## 2018-10-23 DIAGNOSIS — Z48817 Encounter for surgical aftercare following surgery on the skin and subcutaneous tissue: Secondary | ICD-10-CM | POA: Diagnosis not present

## 2018-10-23 DIAGNOSIS — N280 Ischemia and infarction of kidney: Secondary | ICD-10-CM | POA: Diagnosis not present

## 2018-10-23 LAB — POCT INR: INR: 1.7 — AB (ref 2.0–3.0)

## 2018-10-25 DIAGNOSIS — E039 Hypothyroidism, unspecified: Secondary | ICD-10-CM | POA: Diagnosis not present

## 2018-10-25 DIAGNOSIS — L511 Stevens-Johnson syndrome: Secondary | ICD-10-CM | POA: Diagnosis not present

## 2018-10-25 DIAGNOSIS — L03115 Cellulitis of right lower limb: Secondary | ICD-10-CM | POA: Diagnosis not present

## 2018-10-25 DIAGNOSIS — B9561 Methicillin susceptible Staphylococcus aureus infection as the cause of diseases classified elsewhere: Secondary | ICD-10-CM | POA: Diagnosis not present

## 2018-10-25 DIAGNOSIS — Z48817 Encounter for surgical aftercare following surgery on the skin and subcutaneous tissue: Secondary | ICD-10-CM | POA: Diagnosis not present

## 2018-10-25 DIAGNOSIS — E1165 Type 2 diabetes mellitus with hyperglycemia: Secondary | ICD-10-CM | POA: Diagnosis not present

## 2018-10-25 DIAGNOSIS — N280 Ischemia and infarction of kidney: Secondary | ICD-10-CM | POA: Diagnosis not present

## 2018-10-25 DIAGNOSIS — D6862 Lupus anticoagulant syndrome: Secondary | ICD-10-CM | POA: Diagnosis not present

## 2018-10-25 DIAGNOSIS — K8301 Primary sclerosing cholangitis: Secondary | ICD-10-CM | POA: Diagnosis not present

## 2018-10-25 DIAGNOSIS — E272 Addisonian crisis: Secondary | ICD-10-CM | POA: Diagnosis not present

## 2018-10-28 ENCOUNTER — Ambulatory Visit (INDEPENDENT_AMBULATORY_CARE_PROVIDER_SITE_OTHER): Payer: Medicare HMO | Admitting: Cardiology

## 2018-10-28 DIAGNOSIS — L03115 Cellulitis of right lower limb: Secondary | ICD-10-CM | POA: Diagnosis not present

## 2018-10-28 DIAGNOSIS — Z48817 Encounter for surgical aftercare following surgery on the skin and subcutaneous tissue: Secondary | ICD-10-CM | POA: Diagnosis not present

## 2018-10-28 DIAGNOSIS — E1165 Type 2 diabetes mellitus with hyperglycemia: Secondary | ICD-10-CM | POA: Diagnosis not present

## 2018-10-28 DIAGNOSIS — B9561 Methicillin susceptible Staphylococcus aureus infection as the cause of diseases classified elsewhere: Secondary | ICD-10-CM | POA: Diagnosis not present

## 2018-10-28 DIAGNOSIS — E039 Hypothyroidism, unspecified: Secondary | ICD-10-CM | POA: Diagnosis not present

## 2018-10-28 DIAGNOSIS — E271 Primary adrenocortical insufficiency: Secondary | ICD-10-CM | POA: Diagnosis not present

## 2018-10-28 DIAGNOSIS — K8301 Primary sclerosing cholangitis: Secondary | ICD-10-CM | POA: Diagnosis not present

## 2018-10-28 DIAGNOSIS — I823 Embolism and thrombosis of renal vein: Secondary | ICD-10-CM | POA: Diagnosis not present

## 2018-10-28 DIAGNOSIS — L511 Stevens-Johnson syndrome: Secondary | ICD-10-CM | POA: Diagnosis not present

## 2018-10-28 DIAGNOSIS — D6862 Lupus anticoagulant syndrome: Secondary | ICD-10-CM | POA: Diagnosis not present

## 2018-10-28 DIAGNOSIS — Z7901 Long term (current) use of anticoagulants: Secondary | ICD-10-CM

## 2018-10-28 DIAGNOSIS — E272 Addisonian crisis: Secondary | ICD-10-CM | POA: Diagnosis not present

## 2018-10-28 DIAGNOSIS — N280 Ischemia and infarction of kidney: Secondary | ICD-10-CM | POA: Diagnosis not present

## 2018-10-28 LAB — POCT INR: INR: 1.7 — AB (ref 2.0–3.0)

## 2018-10-30 DIAGNOSIS — E039 Hypothyroidism, unspecified: Secondary | ICD-10-CM | POA: Diagnosis not present

## 2018-10-30 DIAGNOSIS — E1165 Type 2 diabetes mellitus with hyperglycemia: Secondary | ICD-10-CM | POA: Diagnosis not present

## 2018-10-30 DIAGNOSIS — L03115 Cellulitis of right lower limb: Secondary | ICD-10-CM | POA: Diagnosis not present

## 2018-10-30 DIAGNOSIS — Z48817 Encounter for surgical aftercare following surgery on the skin and subcutaneous tissue: Secondary | ICD-10-CM | POA: Diagnosis not present

## 2018-10-30 DIAGNOSIS — E272 Addisonian crisis: Secondary | ICD-10-CM | POA: Diagnosis not present

## 2018-10-30 DIAGNOSIS — K8301 Primary sclerosing cholangitis: Secondary | ICD-10-CM | POA: Diagnosis not present

## 2018-10-30 DIAGNOSIS — N280 Ischemia and infarction of kidney: Secondary | ICD-10-CM | POA: Diagnosis not present

## 2018-10-30 DIAGNOSIS — B9561 Methicillin susceptible Staphylococcus aureus infection as the cause of diseases classified elsewhere: Secondary | ICD-10-CM | POA: Diagnosis not present

## 2018-10-30 DIAGNOSIS — L511 Stevens-Johnson syndrome: Secondary | ICD-10-CM | POA: Diagnosis not present

## 2018-10-30 DIAGNOSIS — D6862 Lupus anticoagulant syndrome: Secondary | ICD-10-CM | POA: Diagnosis not present

## 2018-11-01 ENCOUNTER — Ambulatory Visit (INDEPENDENT_AMBULATORY_CARE_PROVIDER_SITE_OTHER): Payer: Medicare HMO | Admitting: Internal Medicine

## 2018-11-01 ENCOUNTER — Encounter: Payer: Self-pay | Admitting: Family Medicine

## 2018-11-01 DIAGNOSIS — N280 Ischemia and infarction of kidney: Secondary | ICD-10-CM | POA: Diagnosis not present

## 2018-11-01 DIAGNOSIS — K8301 Primary sclerosing cholangitis: Secondary | ICD-10-CM | POA: Diagnosis not present

## 2018-11-01 DIAGNOSIS — B9561 Methicillin susceptible Staphylococcus aureus infection as the cause of diseases classified elsewhere: Secondary | ICD-10-CM | POA: Diagnosis not present

## 2018-11-01 DIAGNOSIS — I823 Embolism and thrombosis of renal vein: Secondary | ICD-10-CM | POA: Diagnosis not present

## 2018-11-01 DIAGNOSIS — L511 Stevens-Johnson syndrome: Secondary | ICD-10-CM | POA: Diagnosis not present

## 2018-11-01 DIAGNOSIS — Z48817 Encounter for surgical aftercare following surgery on the skin and subcutaneous tissue: Secondary | ICD-10-CM | POA: Diagnosis not present

## 2018-11-01 DIAGNOSIS — E272 Addisonian crisis: Secondary | ICD-10-CM | POA: Diagnosis not present

## 2018-11-01 DIAGNOSIS — E1165 Type 2 diabetes mellitus with hyperglycemia: Secondary | ICD-10-CM | POA: Diagnosis not present

## 2018-11-01 DIAGNOSIS — E039 Hypothyroidism, unspecified: Secondary | ICD-10-CM | POA: Diagnosis not present

## 2018-11-01 DIAGNOSIS — L03115 Cellulitis of right lower limb: Secondary | ICD-10-CM | POA: Diagnosis not present

## 2018-11-01 DIAGNOSIS — Z7901 Long term (current) use of anticoagulants: Secondary | ICD-10-CM | POA: Diagnosis not present

## 2018-11-01 DIAGNOSIS — D6862 Lupus anticoagulant syndrome: Secondary | ICD-10-CM | POA: Diagnosis not present

## 2018-11-01 LAB — POCT INR: INR: 4.2 — AB (ref 2.0–3.0)

## 2018-11-04 DIAGNOSIS — E272 Addisonian crisis: Secondary | ICD-10-CM | POA: Diagnosis not present

## 2018-11-04 DIAGNOSIS — E039 Hypothyroidism, unspecified: Secondary | ICD-10-CM | POA: Diagnosis not present

## 2018-11-04 DIAGNOSIS — L03115 Cellulitis of right lower limb: Secondary | ICD-10-CM | POA: Diagnosis not present

## 2018-11-04 DIAGNOSIS — Z48817 Encounter for surgical aftercare following surgery on the skin and subcutaneous tissue: Secondary | ICD-10-CM | POA: Diagnosis not present

## 2018-11-04 DIAGNOSIS — D6862 Lupus anticoagulant syndrome: Secondary | ICD-10-CM | POA: Diagnosis not present

## 2018-11-04 DIAGNOSIS — L511 Stevens-Johnson syndrome: Secondary | ICD-10-CM | POA: Diagnosis not present

## 2018-11-04 DIAGNOSIS — K8301 Primary sclerosing cholangitis: Secondary | ICD-10-CM | POA: Diagnosis not present

## 2018-11-04 DIAGNOSIS — B9561 Methicillin susceptible Staphylococcus aureus infection as the cause of diseases classified elsewhere: Secondary | ICD-10-CM | POA: Diagnosis not present

## 2018-11-04 DIAGNOSIS — E1165 Type 2 diabetes mellitus with hyperglycemia: Secondary | ICD-10-CM | POA: Diagnosis not present

## 2018-11-04 DIAGNOSIS — N280 Ischemia and infarction of kidney: Secondary | ICD-10-CM | POA: Diagnosis not present

## 2018-11-05 DIAGNOSIS — L03115 Cellulitis of right lower limb: Secondary | ICD-10-CM | POA: Diagnosis not present

## 2018-11-05 DIAGNOSIS — Z48817 Encounter for surgical aftercare following surgery on the skin and subcutaneous tissue: Secondary | ICD-10-CM | POA: Diagnosis not present

## 2018-11-06 ENCOUNTER — Ambulatory Visit (INDEPENDENT_AMBULATORY_CARE_PROVIDER_SITE_OTHER): Payer: Medicare HMO | Admitting: Pharmacist

## 2018-11-06 DIAGNOSIS — Z7901 Long term (current) use of anticoagulants: Secondary | ICD-10-CM

## 2018-11-06 DIAGNOSIS — K8301 Primary sclerosing cholangitis: Secondary | ICD-10-CM | POA: Diagnosis not present

## 2018-11-06 DIAGNOSIS — M5416 Radiculopathy, lumbar region: Secondary | ICD-10-CM | POA: Diagnosis not present

## 2018-11-06 DIAGNOSIS — D6862 Lupus anticoagulant syndrome: Secondary | ICD-10-CM | POA: Diagnosis not present

## 2018-11-06 DIAGNOSIS — E272 Addisonian crisis: Secondary | ICD-10-CM | POA: Diagnosis not present

## 2018-11-06 DIAGNOSIS — M5126 Other intervertebral disc displacement, lumbar region: Secondary | ICD-10-CM | POA: Diagnosis not present

## 2018-11-06 DIAGNOSIS — I823 Embolism and thrombosis of renal vein: Secondary | ICD-10-CM | POA: Diagnosis not present

## 2018-11-06 DIAGNOSIS — E039 Hypothyroidism, unspecified: Secondary | ICD-10-CM | POA: Diagnosis not present

## 2018-11-06 DIAGNOSIS — N280 Ischemia and infarction of kidney: Secondary | ICD-10-CM | POA: Diagnosis not present

## 2018-11-06 DIAGNOSIS — Z48817 Encounter for surgical aftercare following surgery on the skin and subcutaneous tissue: Secondary | ICD-10-CM | POA: Diagnosis not present

## 2018-11-06 DIAGNOSIS — E1165 Type 2 diabetes mellitus with hyperglycemia: Secondary | ICD-10-CM | POA: Diagnosis not present

## 2018-11-06 DIAGNOSIS — L03115 Cellulitis of right lower limb: Secondary | ICD-10-CM | POA: Diagnosis not present

## 2018-11-06 DIAGNOSIS — B9561 Methicillin susceptible Staphylococcus aureus infection as the cause of diseases classified elsewhere: Secondary | ICD-10-CM | POA: Diagnosis not present

## 2018-11-06 DIAGNOSIS — L511 Stevens-Johnson syndrome: Secondary | ICD-10-CM | POA: Diagnosis not present

## 2018-11-06 LAB — POCT INR: INR: 2 (ref 2.0–3.0)

## 2018-11-08 DIAGNOSIS — Z48817 Encounter for surgical aftercare following surgery on the skin and subcutaneous tissue: Secondary | ICD-10-CM | POA: Diagnosis not present

## 2018-11-08 DIAGNOSIS — L03115 Cellulitis of right lower limb: Secondary | ICD-10-CM | POA: Diagnosis not present

## 2018-11-08 DIAGNOSIS — L511 Stevens-Johnson syndrome: Secondary | ICD-10-CM | POA: Diagnosis not present

## 2018-11-08 DIAGNOSIS — D6862 Lupus anticoagulant syndrome: Secondary | ICD-10-CM | POA: Diagnosis not present

## 2018-11-08 DIAGNOSIS — K8301 Primary sclerosing cholangitis: Secondary | ICD-10-CM | POA: Diagnosis not present

## 2018-11-08 DIAGNOSIS — N280 Ischemia and infarction of kidney: Secondary | ICD-10-CM | POA: Diagnosis not present

## 2018-11-08 DIAGNOSIS — E039 Hypothyroidism, unspecified: Secondary | ICD-10-CM | POA: Diagnosis not present

## 2018-11-08 DIAGNOSIS — E1165 Type 2 diabetes mellitus with hyperglycemia: Secondary | ICD-10-CM | POA: Diagnosis not present

## 2018-11-08 DIAGNOSIS — E272 Addisonian crisis: Secondary | ICD-10-CM | POA: Diagnosis not present

## 2018-11-08 DIAGNOSIS — B9561 Methicillin susceptible Staphylococcus aureus infection as the cause of diseases classified elsewhere: Secondary | ICD-10-CM | POA: Diagnosis not present

## 2018-11-11 DIAGNOSIS — E039 Hypothyroidism, unspecified: Secondary | ICD-10-CM | POA: Diagnosis not present

## 2018-11-11 DIAGNOSIS — K8301 Primary sclerosing cholangitis: Secondary | ICD-10-CM | POA: Diagnosis not present

## 2018-11-11 DIAGNOSIS — Z48817 Encounter for surgical aftercare following surgery on the skin and subcutaneous tissue: Secondary | ICD-10-CM | POA: Diagnosis not present

## 2018-11-11 DIAGNOSIS — L511 Stevens-Johnson syndrome: Secondary | ICD-10-CM | POA: Diagnosis not present

## 2018-11-11 DIAGNOSIS — E1165 Type 2 diabetes mellitus with hyperglycemia: Secondary | ICD-10-CM | POA: Diagnosis not present

## 2018-11-11 DIAGNOSIS — N280 Ischemia and infarction of kidney: Secondary | ICD-10-CM | POA: Diagnosis not present

## 2018-11-11 DIAGNOSIS — L03115 Cellulitis of right lower limb: Secondary | ICD-10-CM | POA: Diagnosis not present

## 2018-11-11 DIAGNOSIS — E272 Addisonian crisis: Secondary | ICD-10-CM | POA: Diagnosis not present

## 2018-11-11 DIAGNOSIS — B9561 Methicillin susceptible Staphylococcus aureus infection as the cause of diseases classified elsewhere: Secondary | ICD-10-CM | POA: Diagnosis not present

## 2018-11-11 DIAGNOSIS — D6862 Lupus anticoagulant syndrome: Secondary | ICD-10-CM | POA: Diagnosis not present

## 2018-11-14 ENCOUNTER — Ambulatory Visit (INDEPENDENT_AMBULATORY_CARE_PROVIDER_SITE_OTHER): Payer: Medicare HMO | Admitting: Pharmacist

## 2018-11-14 DIAGNOSIS — Z7984 Long term (current) use of oral hypoglycemic drugs: Secondary | ICD-10-CM | POA: Diagnosis not present

## 2018-11-14 DIAGNOSIS — Z7901 Long term (current) use of anticoagulants: Secondary | ICD-10-CM

## 2018-11-14 DIAGNOSIS — I823 Embolism and thrombosis of renal vein: Secondary | ICD-10-CM

## 2018-11-14 DIAGNOSIS — K219 Gastro-esophageal reflux disease without esophagitis: Secondary | ICD-10-CM | POA: Diagnosis not present

## 2018-11-14 DIAGNOSIS — I10 Essential (primary) hypertension: Secondary | ICD-10-CM | POA: Diagnosis not present

## 2018-11-14 DIAGNOSIS — Z87891 Personal history of nicotine dependence: Secondary | ICD-10-CM | POA: Diagnosis not present

## 2018-11-14 DIAGNOSIS — D6862 Lupus anticoagulant syndrome: Secondary | ICD-10-CM | POA: Diagnosis not present

## 2018-11-14 DIAGNOSIS — E119 Type 2 diabetes mellitus without complications: Secondary | ICD-10-CM | POA: Diagnosis not present

## 2018-11-14 DIAGNOSIS — Z7982 Long term (current) use of aspirin: Secondary | ICD-10-CM | POA: Diagnosis not present

## 2018-11-14 DIAGNOSIS — S81801A Unspecified open wound, right lower leg, initial encounter: Secondary | ICD-10-CM | POA: Diagnosis not present

## 2018-11-14 DIAGNOSIS — E039 Hypothyroidism, unspecified: Secondary | ICD-10-CM | POA: Diagnosis not present

## 2018-11-14 DIAGNOSIS — L03115 Cellulitis of right lower limb: Secondary | ICD-10-CM | POA: Diagnosis not present

## 2018-11-14 LAB — POCT INR: INR: 3.3 — AB (ref 2.0–3.0)

## 2018-11-18 DIAGNOSIS — K8301 Primary sclerosing cholangitis: Secondary | ICD-10-CM | POA: Diagnosis not present

## 2018-11-18 DIAGNOSIS — E272 Addisonian crisis: Secondary | ICD-10-CM | POA: Diagnosis not present

## 2018-11-18 DIAGNOSIS — E1165 Type 2 diabetes mellitus with hyperglycemia: Secondary | ICD-10-CM | POA: Diagnosis not present

## 2018-11-18 DIAGNOSIS — L03115 Cellulitis of right lower limb: Secondary | ICD-10-CM | POA: Diagnosis not present

## 2018-11-18 DIAGNOSIS — N280 Ischemia and infarction of kidney: Secondary | ICD-10-CM | POA: Diagnosis not present

## 2018-11-18 DIAGNOSIS — B9561 Methicillin susceptible Staphylococcus aureus infection as the cause of diseases classified elsewhere: Secondary | ICD-10-CM | POA: Diagnosis not present

## 2018-11-18 DIAGNOSIS — D6862 Lupus anticoagulant syndrome: Secondary | ICD-10-CM | POA: Diagnosis not present

## 2018-11-18 DIAGNOSIS — E039 Hypothyroidism, unspecified: Secondary | ICD-10-CM | POA: Diagnosis not present

## 2018-11-18 DIAGNOSIS — L511 Stevens-Johnson syndrome: Secondary | ICD-10-CM | POA: Diagnosis not present

## 2018-11-18 DIAGNOSIS — Z48817 Encounter for surgical aftercare following surgery on the skin and subcutaneous tissue: Secondary | ICD-10-CM | POA: Diagnosis not present

## 2018-11-21 ENCOUNTER — Ambulatory Visit (INDEPENDENT_AMBULATORY_CARE_PROVIDER_SITE_OTHER): Payer: Medicare HMO | Admitting: Pharmacist Clinician (PhC)/ Clinical Pharmacy Specialist

## 2018-11-21 DIAGNOSIS — L511 Stevens-Johnson syndrome: Secondary | ICD-10-CM | POA: Diagnosis not present

## 2018-11-21 DIAGNOSIS — E1165 Type 2 diabetes mellitus with hyperglycemia: Secondary | ICD-10-CM | POA: Diagnosis not present

## 2018-11-21 DIAGNOSIS — B9561 Methicillin susceptible Staphylococcus aureus infection as the cause of diseases classified elsewhere: Secondary | ICD-10-CM | POA: Diagnosis not present

## 2018-11-21 DIAGNOSIS — Z48817 Encounter for surgical aftercare following surgery on the skin and subcutaneous tissue: Secondary | ICD-10-CM | POA: Diagnosis not present

## 2018-11-21 DIAGNOSIS — K8301 Primary sclerosing cholangitis: Secondary | ICD-10-CM | POA: Diagnosis not present

## 2018-11-21 DIAGNOSIS — Z7901 Long term (current) use of anticoagulants: Secondary | ICD-10-CM | POA: Diagnosis not present

## 2018-11-21 DIAGNOSIS — E272 Addisonian crisis: Secondary | ICD-10-CM | POA: Diagnosis not present

## 2018-11-21 DIAGNOSIS — I823 Embolism and thrombosis of renal vein: Secondary | ICD-10-CM

## 2018-11-21 DIAGNOSIS — L03115 Cellulitis of right lower limb: Secondary | ICD-10-CM | POA: Diagnosis not present

## 2018-11-21 DIAGNOSIS — N280 Ischemia and infarction of kidney: Secondary | ICD-10-CM | POA: Diagnosis not present

## 2018-11-21 DIAGNOSIS — D6862 Lupus anticoagulant syndrome: Secondary | ICD-10-CM | POA: Diagnosis not present

## 2018-11-21 DIAGNOSIS — E039 Hypothyroidism, unspecified: Secondary | ICD-10-CM | POA: Diagnosis not present

## 2018-11-21 LAB — POCT INR: INR: 1.7 — AB (ref 2.0–3.0)

## 2018-11-22 DIAGNOSIS — T8189XA Other complications of procedures, not elsewhere classified, initial encounter: Secondary | ICD-10-CM | POA: Diagnosis not present

## 2018-11-27 DIAGNOSIS — L97913 Non-pressure chronic ulcer of unspecified part of right lower leg with necrosis of muscle: Secondary | ICD-10-CM | POA: Diagnosis not present

## 2018-11-27 DIAGNOSIS — Z7984 Long term (current) use of oral hypoglycemic drugs: Secondary | ICD-10-CM | POA: Diagnosis not present

## 2018-11-27 DIAGNOSIS — L97818 Non-pressure chronic ulcer of other part of right lower leg with other specified severity: Secondary | ICD-10-CM | POA: Diagnosis not present

## 2018-11-27 DIAGNOSIS — Y838 Other surgical procedures as the cause of abnormal reaction of the patient, or of later complication, without mention of misadventure at the time of the procedure: Secondary | ICD-10-CM | POA: Diagnosis not present

## 2018-11-27 DIAGNOSIS — T8189XA Other complications of procedures, not elsewhere classified, initial encounter: Secondary | ICD-10-CM | POA: Diagnosis not present

## 2018-11-27 DIAGNOSIS — S81801A Unspecified open wound, right lower leg, initial encounter: Secondary | ICD-10-CM | POA: Diagnosis not present

## 2018-11-27 DIAGNOSIS — E11622 Type 2 diabetes mellitus with other skin ulcer: Secondary | ICD-10-CM | POA: Diagnosis not present

## 2018-11-27 DIAGNOSIS — Z6827 Body mass index (BMI) 27.0-27.9, adult: Secondary | ICD-10-CM | POA: Diagnosis not present

## 2018-11-28 ENCOUNTER — Ambulatory Visit (INDEPENDENT_AMBULATORY_CARE_PROVIDER_SITE_OTHER): Payer: Medicare HMO | Admitting: Cardiology

## 2018-11-28 DIAGNOSIS — N280 Ischemia and infarction of kidney: Secondary | ICD-10-CM | POA: Diagnosis not present

## 2018-11-28 DIAGNOSIS — K8301 Primary sclerosing cholangitis: Secondary | ICD-10-CM | POA: Diagnosis not present

## 2018-11-28 DIAGNOSIS — E272 Addisonian crisis: Secondary | ICD-10-CM | POA: Diagnosis not present

## 2018-11-28 DIAGNOSIS — I823 Embolism and thrombosis of renal vein: Secondary | ICD-10-CM

## 2018-11-28 DIAGNOSIS — D6862 Lupus anticoagulant syndrome: Secondary | ICD-10-CM | POA: Diagnosis not present

## 2018-11-28 DIAGNOSIS — Z48817 Encounter for surgical aftercare following surgery on the skin and subcutaneous tissue: Secondary | ICD-10-CM | POA: Diagnosis not present

## 2018-11-28 DIAGNOSIS — Z7901 Long term (current) use of anticoagulants: Secondary | ICD-10-CM

## 2018-11-28 DIAGNOSIS — L511 Stevens-Johnson syndrome: Secondary | ICD-10-CM | POA: Diagnosis not present

## 2018-11-28 DIAGNOSIS — E039 Hypothyroidism, unspecified: Secondary | ICD-10-CM | POA: Diagnosis not present

## 2018-11-28 DIAGNOSIS — B9561 Methicillin susceptible Staphylococcus aureus infection as the cause of diseases classified elsewhere: Secondary | ICD-10-CM | POA: Diagnosis not present

## 2018-11-28 DIAGNOSIS — L03115 Cellulitis of right lower limb: Secondary | ICD-10-CM | POA: Diagnosis not present

## 2018-11-28 DIAGNOSIS — E1165 Type 2 diabetes mellitus with hyperglycemia: Secondary | ICD-10-CM | POA: Diagnosis not present

## 2018-11-28 LAB — POCT INR: INR: 2 (ref 2.0–3.0)

## 2018-11-29 DIAGNOSIS — L03115 Cellulitis of right lower limb: Secondary | ICD-10-CM | POA: Diagnosis not present

## 2018-11-29 DIAGNOSIS — E1165 Type 2 diabetes mellitus with hyperglycemia: Secondary | ICD-10-CM | POA: Diagnosis not present

## 2018-11-29 DIAGNOSIS — N280 Ischemia and infarction of kidney: Secondary | ICD-10-CM | POA: Diagnosis not present

## 2018-11-29 DIAGNOSIS — Z48817 Encounter for surgical aftercare following surgery on the skin and subcutaneous tissue: Secondary | ICD-10-CM | POA: Diagnosis not present

## 2018-11-29 DIAGNOSIS — B9561 Methicillin susceptible Staphylococcus aureus infection as the cause of diseases classified elsewhere: Secondary | ICD-10-CM | POA: Diagnosis not present

## 2018-11-29 DIAGNOSIS — K8301 Primary sclerosing cholangitis: Secondary | ICD-10-CM | POA: Diagnosis not present

## 2018-11-29 DIAGNOSIS — L511 Stevens-Johnson syndrome: Secondary | ICD-10-CM | POA: Diagnosis not present

## 2018-11-29 DIAGNOSIS — D6862 Lupus anticoagulant syndrome: Secondary | ICD-10-CM | POA: Diagnosis not present

## 2018-11-29 DIAGNOSIS — E039 Hypothyroidism, unspecified: Secondary | ICD-10-CM | POA: Diagnosis not present

## 2018-11-29 DIAGNOSIS — E272 Addisonian crisis: Secondary | ICD-10-CM | POA: Diagnosis not present

## 2018-12-02 DIAGNOSIS — E1165 Type 2 diabetes mellitus with hyperglycemia: Secondary | ICD-10-CM | POA: Diagnosis not present

## 2018-12-02 DIAGNOSIS — K8301 Primary sclerosing cholangitis: Secondary | ICD-10-CM | POA: Diagnosis not present

## 2018-12-02 DIAGNOSIS — D6862 Lupus anticoagulant syndrome: Secondary | ICD-10-CM | POA: Diagnosis not present

## 2018-12-02 DIAGNOSIS — Z48817 Encounter for surgical aftercare following surgery on the skin and subcutaneous tissue: Secondary | ICD-10-CM | POA: Diagnosis not present

## 2018-12-02 DIAGNOSIS — L511 Stevens-Johnson syndrome: Secondary | ICD-10-CM | POA: Diagnosis not present

## 2018-12-02 DIAGNOSIS — L03115 Cellulitis of right lower limb: Secondary | ICD-10-CM | POA: Diagnosis not present

## 2018-12-02 DIAGNOSIS — B9561 Methicillin susceptible Staphylococcus aureus infection as the cause of diseases classified elsewhere: Secondary | ICD-10-CM | POA: Diagnosis not present

## 2018-12-02 DIAGNOSIS — E272 Addisonian crisis: Secondary | ICD-10-CM | POA: Diagnosis not present

## 2018-12-02 DIAGNOSIS — E039 Hypothyroidism, unspecified: Secondary | ICD-10-CM | POA: Diagnosis not present

## 2018-12-02 DIAGNOSIS — N280 Ischemia and infarction of kidney: Secondary | ICD-10-CM | POA: Diagnosis not present

## 2018-12-03 DIAGNOSIS — L03115 Cellulitis of right lower limb: Secondary | ICD-10-CM | POA: Diagnosis not present

## 2018-12-03 DIAGNOSIS — Z48817 Encounter for surgical aftercare following surgery on the skin and subcutaneous tissue: Secondary | ICD-10-CM | POA: Diagnosis not present

## 2018-12-04 DIAGNOSIS — E119 Type 2 diabetes mellitus without complications: Secondary | ICD-10-CM | POA: Diagnosis not present

## 2018-12-04 DIAGNOSIS — R0989 Other specified symptoms and signs involving the circulatory and respiratory systems: Secondary | ICD-10-CM | POA: Diagnosis not present

## 2018-12-04 DIAGNOSIS — L97913 Non-pressure chronic ulcer of unspecified part of right lower leg with necrosis of muscle: Secondary | ICD-10-CM | POA: Diagnosis not present

## 2018-12-04 DIAGNOSIS — Z7901 Long term (current) use of anticoagulants: Secondary | ICD-10-CM | POA: Diagnosis not present

## 2018-12-04 DIAGNOSIS — Z7984 Long term (current) use of oral hypoglycemic drugs: Secondary | ICD-10-CM | POA: Diagnosis not present

## 2018-12-04 DIAGNOSIS — L97919 Non-pressure chronic ulcer of unspecified part of right lower leg with unspecified severity: Secondary | ICD-10-CM | POA: Diagnosis not present

## 2018-12-04 DIAGNOSIS — T8189XA Other complications of procedures, not elsewhere classified, initial encounter: Secondary | ICD-10-CM | POA: Diagnosis not present

## 2018-12-04 DIAGNOSIS — S81801A Unspecified open wound, right lower leg, initial encounter: Secondary | ICD-10-CM | POA: Diagnosis not present

## 2018-12-04 DIAGNOSIS — Y838 Other surgical procedures as the cause of abnormal reaction of the patient, or of later complication, without mention of misadventure at the time of the procedure: Secondary | ICD-10-CM | POA: Diagnosis not present

## 2018-12-06 ENCOUNTER — Telehealth: Payer: Self-pay | Admitting: Family Medicine

## 2018-12-06 ENCOUNTER — Ambulatory Visit (INDEPENDENT_AMBULATORY_CARE_PROVIDER_SITE_OTHER): Payer: Medicare HMO | Admitting: Cardiology

## 2018-12-06 DIAGNOSIS — I823 Embolism and thrombosis of renal vein: Secondary | ICD-10-CM | POA: Diagnosis not present

## 2018-12-06 DIAGNOSIS — Z7901 Long term (current) use of anticoagulants: Secondary | ICD-10-CM

## 2018-12-06 LAB — POCT INR: INR: 1.8 — AB (ref 2.0–3.0)

## 2018-12-06 NOTE — Telephone Encounter (Signed)
[facility] calling regarding his visit, and if they can get an order or pt I & r  Please call her at [phone]

## 2018-12-09 DIAGNOSIS — L511 Stevens-Johnson syndrome: Secondary | ICD-10-CM | POA: Diagnosis not present

## 2018-12-09 DIAGNOSIS — D6862 Lupus anticoagulant syndrome: Secondary | ICD-10-CM | POA: Diagnosis not present

## 2018-12-09 DIAGNOSIS — E1165 Type 2 diabetes mellitus with hyperglycemia: Secondary | ICD-10-CM | POA: Diagnosis not present

## 2018-12-09 DIAGNOSIS — L03115 Cellulitis of right lower limb: Secondary | ICD-10-CM | POA: Diagnosis not present

## 2018-12-09 DIAGNOSIS — E039 Hypothyroidism, unspecified: Secondary | ICD-10-CM | POA: Diagnosis not present

## 2018-12-09 DIAGNOSIS — N280 Ischemia and infarction of kidney: Secondary | ICD-10-CM | POA: Diagnosis not present

## 2018-12-09 DIAGNOSIS — Z48817 Encounter for surgical aftercare following surgery on the skin and subcutaneous tissue: Secondary | ICD-10-CM | POA: Diagnosis not present

## 2018-12-09 DIAGNOSIS — B9561 Methicillin susceptible Staphylococcus aureus infection as the cause of diseases classified elsewhere: Secondary | ICD-10-CM | POA: Diagnosis not present

## 2018-12-09 DIAGNOSIS — E272 Addisonian crisis: Secondary | ICD-10-CM | POA: Diagnosis not present

## 2018-12-09 DIAGNOSIS — K8301 Primary sclerosing cholangitis: Secondary | ICD-10-CM | POA: Diagnosis not present

## 2018-12-09 NOTE — Telephone Encounter (Signed)
Called and LMOVM giving VO for PT INR however it looks like he gets that done at [facility]!?!

## 2018-12-11 ENCOUNTER — Other Ambulatory Visit: Payer: Self-pay

## 2018-12-11 ENCOUNTER — Encounter: Payer: Medicare HMO | Attending: Internal Medicine | Admitting: Internal Medicine

## 2018-12-11 DIAGNOSIS — L511 Stevens-Johnson syndrome: Secondary | ICD-10-CM | POA: Diagnosis not present

## 2018-12-11 DIAGNOSIS — Z86718 Personal history of other venous thrombosis and embolism: Secondary | ICD-10-CM | POA: Insufficient documentation

## 2018-12-11 DIAGNOSIS — Z7901 Long term (current) use of anticoagulants: Secondary | ICD-10-CM | POA: Diagnosis not present

## 2018-12-11 DIAGNOSIS — S81801A Unspecified open wound, right lower leg, initial encounter: Secondary | ICD-10-CM | POA: Diagnosis not present

## 2018-12-11 DIAGNOSIS — Z881 Allergy status to other antibiotic agents status: Secondary | ICD-10-CM | POA: Insufficient documentation

## 2018-12-11 DIAGNOSIS — Z880 Allergy status to penicillin: Secondary | ICD-10-CM | POA: Insufficient documentation

## 2018-12-11 DIAGNOSIS — Y838 Other surgical procedures as the cause of abnormal reaction of the patient, or of later complication, without mention of misadventure at the time of the procedure: Secondary | ICD-10-CM | POA: Insufficient documentation

## 2018-12-11 DIAGNOSIS — T8131XA Disruption of external operation (surgical) wound, not elsewhere classified, initial encounter: Secondary | ICD-10-CM | POA: Insufficient documentation

## 2018-12-11 DIAGNOSIS — E11622 Type 2 diabetes mellitus with other skin ulcer: Secondary | ICD-10-CM | POA: Insufficient documentation

## 2018-12-11 DIAGNOSIS — E039 Hypothyroidism, unspecified: Secondary | ICD-10-CM | POA: Insufficient documentation

## 2018-12-11 DIAGNOSIS — Z8249 Family history of ischemic heart disease and other diseases of the circulatory system: Secondary | ICD-10-CM | POA: Insufficient documentation

## 2018-12-11 DIAGNOSIS — Z6827 Body mass index (BMI) 27.0-27.9, adult: Secondary | ICD-10-CM | POA: Diagnosis not present

## 2018-12-11 DIAGNOSIS — L97818 Non-pressure chronic ulcer of other part of right lower leg with other specified severity: Secondary | ICD-10-CM | POA: Diagnosis not present

## 2018-12-11 DIAGNOSIS — Z87891 Personal history of nicotine dependence: Secondary | ICD-10-CM | POA: Diagnosis not present

## 2018-12-11 DIAGNOSIS — B965 Pseudomonas (aeruginosa) (mallei) (pseudomallei) as the cause of diseases classified elsewhere: Secondary | ICD-10-CM | POA: Diagnosis not present

## 2018-12-11 DIAGNOSIS — B9561 Methicillin susceptible Staphylococcus aureus infection as the cause of diseases classified elsewhere: Secondary | ICD-10-CM | POA: Diagnosis not present

## 2018-12-11 DIAGNOSIS — T8141XA Infection following a procedure, superficial incisional surgical site, initial encounter: Secondary | ICD-10-CM | POA: Diagnosis not present

## 2018-12-11 NOTE — Progress Notes (Signed)
Kenneth Cline, Kenneth Cline ([id number]) Visit Report for 12/11/2018 Abuse/Suicide Risk Screen Details Patient Name: Kenneth Cline, Kenneth Cline Date of Service: 12/11/2018 9:45 AM Medical Record Number: [id number] Patient Account Number: [id number] Date of Birth/Sex: Oct 14, 1955 (63 y.o. M) Treating RN: Montey Hora Primary Care Rhya Shan: Jenna Luo Other Clinician: Referring Tressie Ragin: Mahalia Longest Treating Neeva Trew/Extender: Tito Dine in Treatment: 0 Abuse/Suicide Risk Screen Items Answer ABUSE RISK SCREEN: Has anyone close to you tried to hurt or harm you recentlyo No Do you feel uncomfortable with anyone in your familyo No Has anyone forced you do things that you didnot want to doo No Electronic Signature(s) Signed: 12/11/2018 4:23:45 PM By: Montey Hora Entered By: Montey Hora on 12/11/2018 09:53:02 Biggs, [name] ([id number]) -------------------------------------------------------------------------------- Activities of Daily Living Details Patient Name: Kenneth Cline Date of Service: 12/11/2018 9:45 AM Medical Record Number: [id number] Patient Account Number: [id number] Date of Birth/Sex: 08/22/1955 (63 y.o. M) Treating RN: Montey Hora Primary Care Kymia Simi: Jenna Luo Other Clinician: Referring Rylyn Ranganathan: Mahalia Longest Treating Sylva Overley/Extender: Tito Dine in Treatment: 0 Activities of Daily Living Items Answer Activities of Daily Living (Please select one for each item) Drive Automobile Completely Able Take Medications Completely Able Use Telephone Completely Able Care for Appearance Completely Able Use Toilet Completely Able Bath / Shower Completely Able Dress Self Completely Able Feed Self Completely Able Walk Completely Able Get In / Out Bed Completely Able Housework Completely Able Prepare Meals Completely [place] for Self Completely Able Electronic Signature(s) Signed: 12/11/2018 4:23:45 PM By: Montey Hora Entered By: Montey Hora on 12/11/2018 09:53:24 Kenneth Cline ([id number]) -------------------------------------------------------------------------------- Education Screening Details Patient Name: Kenneth Cline Date of Service: 12/11/2018 9:45 AM Medical Record Number: [id number] Patient Account Number: [id number] Date of Birth/Sex: [birth year] (63 y.o. M) Treating RN: Montey Hora Primary Care Jannetta Massey: Jenna Luo Other Clinician: Referring Harmani Neto: Mahalia Longest Treating Aldeen Riga/Extender: Tito Dine in Treatment: 0 Primary Learner Assessed: Patient Learning Preferences/Education Level/Primary Language Learning Preference: Explanation, Demonstration Highest Education Level: College or Above Preferred Language: English Cognitive Barrier Language Barrier: No Translator Needed: No Memory Deficit: No Emotional Barrier: No Cultural/Religious Beliefs Affecting Medical Care: No Physical Barrier Impaired Vision: No Impaired Hearing: No Decreased Hand dexterity: No Knowledge/Comprehension Knowledge Level: Medium Comprehension Level: Medium Ability to understand written Medium instructions: Ability to understand verbal Medium instructions: Motivation Anxiety Level: Calm Cooperation: Cooperative Education Importance: Acknowledges Need Interest in Health Problems: Asks Questions Perception: Coherent Willingness to Engage in Self- Medium Management Activities: Readiness to Engage in Self- Medium Management Activities: Electronic Signature(s) Signed: 12/11/2018 4:23:45 PM By: Montey Hora Entered By: Montey Hora on 12/11/2018 09:53:44 Kasinger, [name] ([id number]) -------------------------------------------------------------------------------- Fall Risk Assessment Details Patient Name: Kenneth Cline Date of Service: 12/11/2018 9:45 AM Medical Record Number: [id number] Patient Account Number: [id number] Date of Birth/Sex: [birth year] (63  y.o. M) Treating RN: Montey Hora Primary Care Vamsi Apfel: Jenna Luo Other Clinician: Referring Sylvester Salonga: Mahalia Longest Treating Jammie Troup/Extender: Tito Dine in Treatment: 0 Fall Risk Assessment Items Have you had 2 or more falls in the last 12 monthso 0 No Have you had any fall that resulted in injury in the last 12 monthso 0 No FALLS RISK SCREEN History of falling - immediate or within 3 months 0 No Secondary diagnosis (Do you have 2 or more medical diagnoseso) 0 No Ambulatory aid None/bed rest/wheelchair/nurse 0 Yes Crutches/cane/walker 0 No Furniture 0 No Intravenous therapy Access/Saline/Heparin Lock 0 No Gait/Transferring Normal/ bed rest/ wheelchair 0 Yes  Weak (short steps with or without shuffle, stooped but able to lift head while 0 No walking, may seek support from furniture) Impaired (short steps with shuffle, may have difficulty arising from chair, head 0 No down, impaired balance) Mental Status Oriented to own ability 0 Yes Electronic Signature(s) Signed: 12/11/2018 4:23:45 PM By: Montey Hora Entered By: Montey Hora on 12/11/2018 09:54:01 [place], [name] ([id number]) -------------------------------------------------------------------------------- Foot Assessment Details Patient Name: Kenneth Cline Date of Service: 12/11/2018 9:45 AM Medical Record Number: [id number] Patient Account Number: [id number] Date of Birth/Sex: 19-Mar-1956 (63 y.o. M) Treating RN: Montey Hora Primary Care Dajanique Robley: Jenna Luo Other Clinician: Referring Zahava Quant: Mahalia Longest Treating Kaarin Pardy/Extender: Tito Dine in Treatment: 0 Foot Assessment Items Site Locations + = Sensation present, - = Sensation absent, C = Callus, U = Ulcer R = Redness, W = Warmth, M = Maceration, PU = Pre-ulcerative lesion F = Fissure, S = Swelling, D = Dryness Assessment Right: Left: Other Deformity: No No Prior Foot Ulcer: No No Prior Amputation: No  No Charcot Joint: No No Ambulatory Status: Ambulatory Without Help Gait: Steady Electronic Signature(s) Signed: 12/11/2018 4:23:45 PM By: Montey Hora Entered By: Montey Hora on 12/11/2018 10:01:28 Kenneth Cline ([id number]) -------------------------------------------------------------------------------- Nutrition Risk Screening Details Patient Name: Kenneth Cline Date of Service: 12/11/2018 9:45 AM Medical Record Number: [id number] Patient Account Number: [id number] Date of Birth/Sex: Apr 11, 1956 (63 y.o. M) Treating RN: Montey Hora Primary Care Lamija Besse: Jenna Luo Other Clinician: Referring Analyah Mcconnon: Mahalia Longest Treating Avid Guillette/Extender: Tito Dine in Treatment: 0 Height (in): 69 Weight (lbs): 183 Body Mass Index (BMI): 27 Nutrition Risk Screening Items Score Screening NUTRITION RISK SCREEN: I have an illness or condition that made me change the kind and/or amount of 0 No food I eat I eat fewer than two meals per day 0 No I eat few fruits and vegetables, or milk products 0 No I have three or more drinks of beer, liquor or wine almost every day 0 No I have tooth or mouth problems that make it hard for me to eat 0 No I don't always have enough money to buy the food I need 0 No I eat alone most of the time 0 No I take three or more different prescribed or over-the-counter drugs a day 1 Yes Without wanting to, I have lost or gained 10 pounds in the last six months 0 No I am not always physically able to shop, cook and/or feed myself 0 No Nutrition Protocols Good Risk Protocol 0 No interventions needed Moderate Risk Protocol High Risk Proctocol Risk Level: Good Risk Score: 1 Electronic Signature(s) Signed: 12/11/2018 4:23:45 PM By: Montey Hora Entered By: Montey Hora on 12/11/2018 09:54:28

## 2018-12-12 NOTE — Progress Notes (Signed)
Kenneth Cline, Kenneth Cline ([id number]) Visit Report for 12/11/2018 Chief Complaint Document Details Patient Name: Kenneth Cline, Kenneth Cline Date of Service: 12/11/2018 9:45 AM Medical Record Number: [id number] Patient Account Number: [id number] Date of Birth/Sex: December 01, 1955 (63 y.o. M) Treating RN: Cornell Barman Primary Care Provider: Jenna Luo Other Clinician: Referring Provider: Mahalia Longest Treating Provider/Extender: Tito Dine in Treatment: 0 Information Obtained from: Patient Chief Complaint 12/11/2018; patient comes in for review of wounds on his right lower leg Electronic Signature(s) Signed: 12/11/2018 5:33:48 PM By: Linton Ham MD Entered By: Linton Ham on 12/11/2018 12:23:23 Kenneth Cline ([id number]) -------------------------------------------------------------------------------- HPI Details Patient Name: Kenneth Cline Date of Service: 12/11/2018 9:45 AM Medical Record Number: [id number] Patient Account Number: [id number] Date of Birth/Sex: 30-Jun-1955 (63 y.o. M) Treating RN: Cornell Barman Primary Care Provider: Jenna Luo Other Clinician: Referring Provider: Mahalia Longest Treating Provider/Extender: Tito Dine in Treatment: 0 History of Present Illness HPI Description: ADMISSION 12/11/2018 This is a 63 year old man who is a type II diabetic. He apparently developed a "boil" on his right lower leg sometime in mid June. He subsequently went to the beach and was in the ocean. He was admitted to [facility] from 6/20 through 6/30 with septic shock felt to be secondary from cellulitis in the right lower leg. There was concern for compartment syndrome and on 6/20 he underwent a right leg fasciotomy by Dr. Ardeth Sportsman of surgery. He received different courses of antibiotics. As far as I am able to determine the wound had an MSSA infection based on follow-up notes from general surgery but I cannot see the actual culture result. In any case he was discharged  on doxycycline. He also had a wound VAC on this for a period of time. He saw general surgery in follow-up on 11/14/2018 and I am able to see this note in [place] link. He had an "right lower extremity MSSA infection". At that point his wounds were 2 measuring 3 x 4 x 5x2 wound VAC was discontinued at that time and he was put on a wet to dry dressing. He also was given clindamycin for the recurrent wound infection. He was scheduled actually to go to the OR for further debridement although I cannot see that this was actually done. He tells me he went to the wound care center at [facility] on 3 occasions. By the time he was seen at the wound care center on 8 5 there were 3 wounds all requiring debridement. They were dressed with Iodosorb and poly-man. On 8/12 a wound culture was done that showed light Pseudomonas and a few Staphylococcus although he was not started on antibiotics out of concern for a drug interaction with Coumadin. He actually lives in [place] so he is come to our clinic today for follow-up. 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 His ABIs in our clinic were 1.27 on the right and 1.2 on the left. Electronic Signature(s) Signed: 12/11/2018 5:33:48 PM By: Linton Ham MD Entered By: Linton Ham on 12/11/2018 12:28:34 Kenneth Cline ([id number]) -------------------------------------------------------------------------------- Physical Exam Details Patient Name: Kenneth Cline Date of Service: 12/11/2018 9:45 AM Medical Record Number: [id number] Patient Account Number: [id number] Date of Birth/Sex: 1955-07-27 (63 y.o. M) Treating RN: Cornell Barman Primary Care Provider: Jenna Luo Other Clinician: Referring  Provider: Carole Civil  MARK Treating Provider/Extender: Ricard Dillon Weeks in Treatment: 0 Constitutional Patient is hypertensive.. Pulse regular and within target range for patient.Marland Kitchen Respirations regular, non-labored and within target range.. Temperature is normal and within the target range for the patient.Marland Kitchen appears in no distress. Eyes Conjunctivae clear. No discharge. Respiratory Respiratory effort is easy and symmetric bilaterally. Rate is normal at rest and on room air.. Bilateral breath sounds are clear and equal in all lobes with no wheezes, rales or rhonchi.. Cardiovascular Heart rhythm and rate regular, without murmur or gallop.. Right femoral and popliteal palpable. Pedal pulses are palpable. Minimal to no edema in the right leg. Lymphatic None palpable in the right popliteal or inguinal area. Integumentary (Hair, Skin) There is no erythema around the wounds and the wounds actually appear to have a healthy base. Psychiatric No evidence of depression, anxiety, or agitation. Calm, cooperative, and communicative. Appropriate interactions and affect.. Notes Wound exam; the patient has 2 wounds medial to the tibia. Smaller one is proximal in the larger more superficial one distal. There is some hyper granulation in the distal wound but I did not see anything that required surgical debridement here. Also specifically no infection in either area. The surgical wound is just lateral to the tibia nice healthy looking granulation and this is already probably more than 50% epithelialized. I did not see the need for debridement in any area Electronic Signature(s) Signed: 12/11/2018 5:33:48 PM By: Linton Ham MD Entered By: Linton Ham on 12/11/2018 12:31:10 Kenneth Cline ([id number]) -------------------------------------------------------------------------------- Physician Orders Details Patient Name: Kenneth Cline Date of Service: 12/11/2018 9:45 AM Medical Record Number:  [id number] Patient Account Number: [id number] Date of Birth/Sex: 30-Oct-1955 (63 y.o. M) Treating RN: Cornell Barman Primary Care Provider: Jenna Luo Other Clinician: Referring Provider: Mahalia Longest Treating Provider/Extender: Tito Dine in Treatment: 0 Verbal / Phone Orders: No Diagnosis Coding Wound Cleansing Wound #1 Right,Proximal,Medial Lower Leg o Cleanse wound with mild soap and water o May Shower, gently pat wound dry prior to applying new dressing. Wound #2 Right,Distal,Medial Lower Leg o Cleanse wound with mild soap and water o May Shower, gently pat wound dry prior to applying new dressing. Wound #3 Right,Medial Lower Leg o Cleanse wound with mild soap and water o May Shower, gently pat wound dry prior to applying new dressing. Anesthetic (add to Medication List) Wound #1 Right,Proximal,Medial Lower Leg o Topical Lidocaine 4% cream applied to wound bed prior to debridement (In Clinic Only). Wound #2 Right,Distal,Medial Lower Leg o Topical Lidocaine 4% cream applied to wound bed prior to debridement (In Clinic Only). Wound #3 Right,Medial Lower Leg o Topical Lidocaine 4% cream applied to wound bed prior to debridement (In Clinic Only). Primary Wound Dressing Wound #1 Right,Proximal,Medial Lower Leg o Hydrafera Blue Ready Transfer Wound #2 Right,Distal,Medial Lower Leg o Hydrafera Blue Ready Transfer Wound #3 Right,Medial Lower Leg o Hydrafera Blue Ready Transfer Secondary Dressing Wound #1 Right,Proximal,Medial Lower Leg o ABD pad Wound #2 Right,Distal,Medial Lower Leg o ABD pad Wound #3 Right,Medial Lower Leg o ABD pad Dressing Change Frequency AC, COLAN. ([id number]) Wound #1 Right,Proximal,Medial Lower Leg o Change Dressing Monday, Wednesday, Friday Wound #2 Right,Distal,Medial Lower Leg o Change Dressing Monday, Wednesday, Friday Wound #3 Right,Medial Lower Leg o Change Dressing Monday, Wednesday,  Friday Follow-up Appointments Wound #1 Right,Proximal,Medial Lower Leg o Return Appointment in 1 week. Wound #2 Right,Distal,Medial Lower Leg o Return Appointment in 1 week. Wound #3 Right,Medial Lower Leg o Return Appointment in 1 week. Edema Control  Wound #1 Right,Proximal,Medial Lower Leg o 3 Layer Compression System - Right Lower Extremity - May use 2 layer at home o Elevate legs to the level of the heart and pump ankles as often as possible Wound #2 Right,Distal,Medial Lower Leg o 3 Layer Compression System - Right Lower Extremity - May use 2 layer at home o Elevate legs to the level of the heart and pump ankles as often as possible Wound #3 Right,Medial Lower Leg o 3 Layer Compression System - Right Lower Extremity - May use 2 layer at home o Elevate legs to the level of the heart and pump ankles as often as possible Home Health Wound #1 Right,Proximal,Medial Lower Leg o [place] Visits - [place] Nurse may visit PRN to address patientos wound care needs. o FACE TO FACE ENCOUNTER: MEDICARE and MEDICAID PATIENTS: I certify that this patient is under my care and that I had a face-to-face encounter that meets the physician face-to-face encounter requirements with this patient on this date. The encounter with the patient was in whole or in part for the following MEDICAL CONDITION: (primary reason for [place]) MEDICAL NECESSITY: I certify, that based on my findings, NURSING services are a medically necessary home health service. HOME BOUND STATUS: I certify that my clinical findings support that this patient is homebound (i.e., Due to illness or injury, pt requires aid of supportive devices such as crutches, cane, wheelchairs, walkers, the use of special transportation or the assistance of another person to leave their place of residence. There is a normal inability to leave the home and doing so requires considerable and taxing  effort. Other absences are for medical reasons / religious services and are infrequent or of short duration when for other reasons). o If current dressing causes regression in wound condition, may D/C ordered dressing product/s and apply Normal Saline Moist Dressing daily until next [place] / Other MD appointment. [place] of regression in wound condition at [phone]. o Please direct any NON-WOUND related issues/requests for orders to patient's Primary Care Physician Wound #2 Right,Distal,Medial Lower Leg o [place] Visits - [place] Nurse may visit PRN to address patientos wound care needs. o FACE TO FACE ENCOUNTER: MEDICARE and MEDICAID PATIENTS: I certify that this patient is under my care and that I had a face-to-face encounter that meets the physician face-to-face encounter requirements with this Kenneth Cline, HAROLD. ([id number]) patient on this date. The encounter with the patient was in whole or in part for the following MEDICAL CONDITION: (primary reason for [place]) MEDICAL NECESSITY: I certify, that based on my findings, NURSING services are a medically necessary home health service. HOME BOUND STATUS: I certify that my clinical findings support that this patient is homebound (i.e., Due to illness or injury, pt requires aid of supportive devices such as crutches, cane, wheelchairs, walkers, the use of special transportation or the assistance of another person to leave their place of residence. There is a normal inability to leave the home and doing so requires considerable and taxing effort. Other absences are for medical reasons / religious services and are infrequent or of short duration when for other reasons). o If current dressing causes regression in wound condition, may D/C ordered dressing product/s and apply Normal Saline Moist Dressing daily until next [place] / Other MD appointment.  [place] of regression in wound condition at [phone]. o Please direct any NON-WOUND related issues/requests  for orders to patient's Primary Care Physician Wound #3 Right,Medial Lower Leg o [place] Visits - [place] Nurse may visit PRN to address patientos wound care needs. o FACE TO FACE ENCOUNTER: MEDICARE and MEDICAID PATIENTS: I certify that this patient is under my care and that I had a face-to-face encounter that meets the physician face-to-face encounter requirements with this patient on this date. The encounter with the patient was in whole or in part for the following MEDICAL CONDITION: (primary reason for [place]) MEDICAL NECESSITY: I certify, that based on my findings, NURSING services are a medically necessary home health service. HOME BOUND STATUS: I certify that my clinical findings support that this patient is homebound (i.e., Due to illness or injury, pt requires aid of supportive devices such as crutches, cane, wheelchairs, walkers, the use of special transportation or the assistance of another person to leave their place of residence. There is a normal inability to leave the home and doing so requires considerable and taxing effort. Other absences are for medical reasons / religious services and are infrequent or of short duration when for other reasons). o If current dressing causes regression in wound condition, may D/C ordered dressing product/s and apply Normal Saline Moist Dressing daily until next [place] / Other MD appointment. [place] of regression in wound condition at [phone]. o Please direct any NON-WOUND related issues/requests for orders to patient's Primary Care Physician Patient Medications Allergies: Biaxin, Keflex, penicillin Notifications Medication Indication Start End lidocaine DOSE topical 4 % cream - cream topical Electronic Signature(s) Signed:  12/11/2018 11:59:43 AM By: Gretta Cool, BSN, RN, CWS, Kim RN, BSN Signed: 12/11/2018 5:33:48 PM By: Linton Ham MD Entered By: Gretta Cool, BSN, RN, CWS, Kim on 12/11/2018 10:17:00 Kenneth Cline, Kenneth Cline ([id number]) -------------------------------------------------------------------------------- Prescription 12/11/2018 Patient Name: Kenneth Cline Provider: Ricard Dillon MD Date of Birth: 1956-02-05 NPI#: [id number] Sex: Jerilynn Mages DEA#: [id number] Phone #: [phone] License #: [id number] Patient Address: [facility] [place], [place] [address] [address], [place], [place] [address] [phone] Allergies Biaxin Reaction: stevens johnson syndrome Keflex penicillin Medication Medication: Route: Strength: Form: lidocaine topical 4% cream Class: TOPICAL LOCAL ANESTHETICS Dose: Frequency / Time: Indication: cream topical Number of Refills: Number of Units: 0 Generic Substitution: Start Date: End Date: Administered at Substitution Permitted Facility: Yes Time Administered: Time Discontinued: Note to Pharmacy: Signature(s): Date(s): TREYVEON, MOCHIZUKI ([id number]) Electronic Signature(s) Signed: 12/11/2018 11:59:43 AM By: Gretta Cool, BSN, RN, CWS, Kim RN, BSN Signed: 12/11/2018 5:33:48 PM By: Linton Ham MD Entered By: Gretta Cool, BSN, RN, CWS, Kim on 12/11/2018 10:17:01 Kenneth Cline ([id number]) --------------------------------------------------------------------------------  Problem List Details Patient Name: Kenneth Cline Date of Service: 12/11/2018 9:45 AM Medical Record Number: [id number] Patient Account Number: [id number] Date of Birth/Sex: 06/01/1955 (63 y.o. M) Treating RN: Cornell Barman Primary Care Provider: Jenna Luo Other Clinician: Referring Provider: Mahalia Longest Treating Provider/Extender: Tito Dine in Treatment: 0 Active Problems ICD-10 Evaluated  Encounter Code Description Active Date Today Diagnosis L97.818 Non-pressure chronic ulcer of other part of right lower leg 12/11/2018 No Yes with other specified severity T81.31XA Disruption of external operation (surgical) wound, not 12/11/2018 No Yes elsewhere classified, initial encounter Inactive Problems Resolved Problems Electronic Signature(s) Signed: 12/11/2018 5:33:48 PM By: Linton Ham MD Entered By: Linton Ham on 12/11/2018 10:54:01 [place], [name] ([id number]) -------------------------------------------------------------------------------- Progress Note Details Patient Name: Kenneth Cline Date of Service: 12/11/2018  9:45 AM Medical Record Number: [id number] Patient Account Number: [id number] Date of Birth/Sex: Jun 15, 1955 (63 y.o. M) Treating RN: Cornell Barman Primary Care Provider: Jenna Luo Other Clinician: Referring Provider: Mahalia Longest Treating Provider/Extender: Tito Dine in Treatment: 0 Subjective Chief Complaint Information obtained from Patient 12/11/2018; patient comes in for review of wounds on his right lower leg History of Present Illness (HPI) ADMISSION 12/11/2018 This is a 63 year old man who is a type II diabetic. He apparently developed a "boil" on his right lower leg sometime in mid June. He subsequently went to the beach and was in the ocean. He was admitted to [facility] from 6/20 through 6/30 with septic shock felt to be secondary from cellulitis in the right lower leg. There was concern for compartment syndrome and on 6/20 he underwent a right leg fasciotomy by Dr. Ardeth Sportsman of surgery. He received different courses of antibiotics. As far as I am able to determine the wound had an MSSA infection based on follow-up notes from general surgery but I cannot see the actual culture result. In any case he was discharged on doxycycline. He also had a wound VAC on this for a period of time. He saw general surgery in follow-up on  11/14/2018 and I am able to see this note in [place] link. He had an "right lower extremity MSSA infection". At that point his wounds were 2 measuring 3 x 4 x 5x2 wound VAC was discontinued at that time and he was put on a wet to dry dressing. He also was given clindamycin for the recurrent wound infection. He was scheduled actually to go to the OR for further debridement although I cannot see that this was actually done. He tells me he went to the wound care center at [facility] on 3 occasions. By the time he was seen at the wound care center on 8 5 there were 3 wounds all requiring debridement. They were dressed with Iodosorb and poly-man. On 8/12 a wound culture was done that showed light Pseudomonas and a few Staphylococcus although he was not started on antibiotics out of concern for a drug interaction with Coumadin. He actually lives in [place] so he is come to our clinic today for follow-up. 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 His ABIs in our clinic were 1.27 on the right and 1.2 on the left. Patient History Information obtained from Patient. Allergies Biaxin (Reaction: stevens johnson syndrome), Keflex, penicillin Family History Cancer - Maternal Grandparents, Heart Disease - Siblings,Father, Hypertension - Father,Siblings, No family history of Diabetes, Hereditary Spherocytosis, Kidney Disease, Lung Disease, Seizures, Stroke, Thyroid Problems, Tuberculosis. Social History Former smoker - 20+ years, Alcohol Use - Never, Drug Use - No History, Caffeine Use - Daily. Medical History [place], OLUND ([id number]) Hematologic/Lymphatic Denies history of Anemia, Hemophilia, Human Immunodeficiency Virus, Lymphedema,  Sickle Cell Disease Gastrointestinal Denies history of Cirrhosis , Colitis, Crohn s, Hepatitis A, Hepatitis B, Hepatitis C Endocrine Patient has history of Type II Diabetes Denies history of Type I Diabetes Integumentary (Skin) Denies history of History of Burn, History of pressure wounds Patient is treated with Oral Agents. Blood sugar is not tested. 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 Eyes Denies complaints or symptoms of Dry Eyes, Vision Changes, Glasses / Contacts. Ear/Nose/Mouth/Throat Denies complaints or symptoms of Difficult clearing ears, Sinusitis. Hematologic/Lymphatic Denies complaints or symptoms of Bleeding / Clotting Disorders, Human Immunodeficiency Virus. Respiratory Denies complaints or symptoms of Chronic or frequent coughs, Shortness of Breath. Cardiovascular Denies complaints or symptoms of Chest pain, LE edema. Gastrointestinal Denies complaints or symptoms of Frequent diarrhea, Nausea, Vomiting. Endocrine Denies complaints or symptoms of Hepatitis, Thyroid disease, Polydypsia (Excessive Thirst). Genitourinary Denies complaints or symptoms of Kidney failure/ Dialysis, Incontinence/dribbling. Immunological Denies complaints or symptoms of Hives, Itching. Integumentary (Skin) Complains or has symptoms of Wounds. Denies complaints or symptoms of Bleeding or bruising tendency, Breakdown, Swelling. Musculoskeletal Denies complaints or symptoms of Muscle Pain, Muscle Weakness. Neurologic Denies complaints or symptoms of Numbness/parasthesias, Focal/Weakness. Psychiatric Denies complaints or symptoms of Anxiety, Claustrophobia. Kenneth Cline, Kenneth Cline  ([id number]) Objective Constitutional Patient is hypertensive.. Pulse regular and within target range for patient.Marland Kitchen Respirations regular, non-labored and within target range.. Temperature is normal and within the target range for the patient.Marland Kitchen appears in no distress. Vitals Time Taken: 9:35 AM, Height: 69 in, Source: Stated, Weight: 183 lbs, Source: Measured, BMI: 27, Temperature: 98.3  F, Pulse: 83 bpm, Respiratory Rate: 16 breaths/min, Blood Pressure: 126/90 mmHg. Eyes Conjunctivae clear. No discharge. Respiratory Respiratory effort is easy and symmetric bilaterally. Rate is normal at rest and on room air.. Bilateral breath sounds are clear and equal in all lobes with no wheezes, rales or rhonchi.. Cardiovascular Heart rhythm and rate regular, without murmur or gallop.. Right femoral and popliteal palpable. Pedal pulses are palpable. Minimal to no edema in the right leg. Lymphatic None palpable in the right popliteal or inguinal area. Psychiatric No evidence of depression, anxiety, or agitation. Calm, cooperative, and communicative. Appropriate interactions and affect.. General Notes: Wound exam; the patient has 2 wounds medial to the tibia. Smaller one is proximal in the larger more superficial one distal. There is some hyper granulation in the distal wound but I did not see anything that required surgical debridement here. Also specifically no infection in either area. The surgical wound is just lateral to the tibia nice healthy looking granulation and this is already probably more than 50% epithelialized. I did not see the need for debridement in any area Integumentary (Hair, Skin) There is no erythema around the wounds and the wounds actually appear to have a healthy base. Wound #1 status is Open. Original cause of wound was Gradually Appeared. The wound is located on the Right,Proximal,Medial Lower Leg. The wound measures 0.9cm length x 1cm width x 0.3cm depth; 0.707cm^2 area  and 0.212cm^3 volume. There is Fat Layer (Subcutaneous Tissue) Exposed exposed. There is no tunneling or undermining noted. There is a medium amount of serous drainage noted. The wound margin is flat and intact. There is large (67-100%) pink granulation within the wound bed. There is no necrotic tissue within the wound bed. Wound #2 status is Open. Original cause of wound was Gradually Appeared. The wound is located on the Right,Distal,Medial Lower Leg. The wound measures 4.4cm length x 2.5cm width x 0.4cm depth; 8.639cm^2 area and 3.456cm^3 volume. There is Fat Layer (Subcutaneous Tissue) Exposed exposed. There is no tunneling or undermining noted. There is a medium amount of serous drainage noted. The wound margin is flat and intact. There is medium (34-66%) red granulation within the wound bed. There is a medium (34-66%) amount of necrotic tissue within  the wound bed including Adherent Slough. Wound #3 status is Open. Original cause of wound was Surgical Injury. The wound is located on the Right,Medial Lower Leg. The wound measures 5.5cm length x 1cm width x 0.1cm depth; 4.32cm^2 area and 0.432cm^3 volume. There is Fat Layer (Subcutaneous Tissue) Exposed exposed. There is no tunneling or undermining noted. There is a medium amount of serous drainage noted. The wound margin is flat and intact. There is large (67-100%) red granulation within the wound bed. There is a small (1-33%) amount of necrotic tissue within the wound bed including [place]. Kenneth Cline, Kenneth Cline ([id number]) Assessment Active Problems ICD-10 Non-pressure chronic ulcer of other part of right lower leg with other specified severity Disruption of external operation (surgical) wound, not elsewhere classified, initial encounter Plan Wound Cleansing: Wound #1 Right,Proximal,Medial Lower Leg: Cleanse wound with mild soap and water May Shower, gently pat wound dry prior to applying new dressing. Wound #2 Right,Distal,Medial  Lower Leg: Cleanse wound with mild soap and water May Shower, gently pat wound dry prior to applying new dressing. Wound #3 Right,Medial Lower Leg: Cleanse wound with mild soap and water May Shower, gently pat wound dry prior to applying new dressing. Anesthetic (add to Medication List): Wound #1 Right,Proximal,Medial Lower Leg: Topical Lidocaine 4% cream applied to wound bed prior to debridement (In Clinic Only). Wound #2 Right,Distal,Medial Lower Leg: Topical Lidocaine 4% cream applied to wound bed prior to debridement (In Clinic Only). Wound #3 Right,Medial Lower Leg: Topical Lidocaine 4% cream applied to wound bed prior to debridement (In Clinic Only). Primary Wound Dressing: Wound #1 Right,Proximal,Medial Lower Leg: Hydrafera Blue Ready Transfer Wound #2 Right,Distal,Medial Lower Leg: Hydrafera Blue Ready Transfer Wound #3 Right,Medial Lower Leg: Hydrafera Blue Ready Transfer Secondary Dressing: Wound #1 Right,Proximal,Medial Lower Leg: ABD pad Wound #2 Right,Distal,Medial Lower Leg: ABD pad Wound #3 Right,Medial Lower Leg: ABD pad Dressing Change Frequency: Wound #1 Right,Proximal,Medial Lower Leg: Change Dressing Monday, Wednesday, Friday Wound #2 Right,Distal,Medial Lower Leg: Change Dressing Monday, Wednesday, Friday Wound #3 Right,Medial Lower Leg: Change Dressing Monday, Wednesday, Friday Follow-up Appointments: Wound #1 Right,Proximal,Medial Lower Leg: Return Appointment in 1 week. Wound #2 Right,Distal,Medial Lower Leg: Return Appointment in 1 week. Kenneth Cline, Kenneth Cline ([id number]) Wound #3 Right,Medial Lower Leg: Return Appointment in 1 week. Edema Control: Wound #1 Right,Proximal,Medial Lower Leg: 3 Layer Compression System - Right Lower Extremity - May use 2 layer at home Elevate legs to the level of the heart and pump ankles as often as possible Wound #2 Right,Distal,Medial Lower Leg: 3 Layer Compression System - Right Lower Extremity - May use 2 layer at  home Elevate legs to the level of the heart and pump ankles as often as possible Wound #3 Right,Medial Lower Leg: 3 Layer Compression System - Right Lower Extremity - May use 2 layer at home Elevate legs to the level of the heart and pump ankles as often as possible Home Health: Wound #1 Right,Proximal,Medial Lower Leg: Continue Home Health Visits - [place] Nurse may visit PRN to address patient s wound care needs. FACE TO FACE ENCOUNTER: MEDICARE and MEDICAID PATIENTS: I certify that this patient is under my care and that I had a face-to-face encounter that meets the physician face-to-face encounter requirements with this patient on this date. The encounter with the patient was in whole or in part for the following MEDICAL CONDITION: (primary reason for [place]) MEDICAL NECESSITY: I certify, that based on my findings, NURSING services are a medically necessary home health service.  HOME BOUND STATUS: I certify that my clinical findings support that this patient is homebound (i.e., Due to illness or injury, pt requires aid of supportive devices such as crutches, cane, wheelchairs, walkers, the use of special transportation or the assistance of another person to leave their place of residence. There is a normal inability to leave the home and doing so requires considerable and taxing effort. Other absences are for medical reasons / religious services and are infrequent or of short duration when for other reasons). If current dressing causes regression in wound condition, may D/C ordered dressing product/s and apply Normal Saline Moist Dressing daily until next [place] / Other MD appointment. [place] of regression in wound condition at [phone]. Please direct any NON-WOUND related issues/requests for orders to patient's Primary Care Physician Wound #2 Right,Distal,Medial Lower Leg: [place] Nurse  may visit PRN to address patient s wound care needs. FACE TO FACE ENCOUNTER: MEDICARE and MEDICAID PATIENTS: I certify that this patient is under my care and that I had a face-to-face encounter that meets the physician face-to-face encounter requirements with this patient on this date. The encounter with the patient was in whole or in part for the following MEDICAL CONDITION: (primary reason for [place]) MEDICAL NECESSITY: I certify, that based on my findings, NURSING services are a medically necessary home health service. HOME BOUND STATUS: I certify that my clinical findings support that this patient is homebound (i.e., Due to illness or injury, pt requires aid of supportive devices such as crutches, cane, wheelchairs, walkers, the use of special transportation or the assistance of another person to leave their place of residence. There is a normal inability to leave the home and doing so requires considerable and taxing effort. Other absences are for medical reasons / religious services and are infrequent or of short duration when for other reasons). If current dressing causes regression in wound condition, may D/C ordered dressing product/s and apply Normal Saline Moist Dressing daily until next [place] / Other MD appointment. [place] of regression in wound condition at [phone]. Please direct any NON-WOUND related issues/requests for orders to patient's Primary Care Physician Wound #3 Right,Medial Lower Leg: [place] Nurse may visit PRN to address patient s wound care needs. FACE TO FACE ENCOUNTER: MEDICARE and MEDICAID PATIENTS: I certify that this patient is under my care and that I had a face-to-face encounter that meets the physician face-to-face encounter requirements with this patient on this date. The encounter with the patient was in whole or in part for the following MEDICAL CONDITION: (primary  reason for [place]) MEDICAL NECESSITY: I certify, that based on my findings, NURSING services are a medically necessary home health service. HOME BOUND STATUS: I certify that my clinical findings support that this patient is homebound (i.e., Due to illness or injury, pt requires aid of supportive devices such as crutches, cane, wheelchairs, walkers, the use of special transportation or the assistance of another person to leave their place of residence. There is a normal inability to leave the home and doing so requires considerable and taxing effort. Other absences are for medical reasons / religious services and are infrequent or of short duration when for other reasons). If current dressing causes regression in wound condition, may D/C ordered dressing product/s and apply Normal Saline Moist Dressing daily until next [place] / Other MD appointment. [facility] of [facility] in wound condition at [phone]. Kenneth Cline, Kenneth Cline ([id number]) Please direct any NON-WOUND related issues/requests for orders to patient's Primary Care Physician The following medication(s) was prescribed: lidocaine topical 4 % cream cream topical was prescribed at facility 1. I saw no need for debridement of any of the 3 wounds including the 2 just medial to the tibia and the lateral area. 2. Also specific to the thought of infection although the culture from a week ago suggested light Pseudomonas and staph species at this point I saw no need for antibiotics. I share the doctors concern in [place] about using quinolones in the setting of Coumadin although I think they could be used with careful follow-up of his INR. Noteworthy that he is allergic to penicillin and cephalosporins 3. All of his wounds look very healthy. The distal medial area had some hyper granulation I therefore chose Hydrofera Blue as the primary dressing to all wound areas. We put him in ABDs and 3 layer  compression. 4. I am generally concerned about not treating the Pseudomonas that was cultured although I do not have the exact culture result. However this was not the major issue, I simply see no evidence of active infection in any of these wounds. If necessary I would use quinolones and simply monitor his Coumadin carefully. Last INR I see in epic was 1.8 on 8/14 Electronic Signature(s) Signed: 12/11/2018 5:33:48 PM By: Linton Ham MD Entered By: Linton Ham on 12/11/2018 12:34:17 Kenneth Cline ([id number]) -------------------------------------------------------------------------------- ROS/PFSH Details Patient Name: Kenneth Cline Date of Service: 12/11/2018 9:45 AM Medical Record Number: [id number] Patient Account Number: [id number] Date of Birth/Sex: 1955/10/08 (63 y.o. M) Treating RN: Montey Hora Primary Care Provider: Jenna Luo Other Clinician: Referring Provider: Mahalia Longest Treating Provider/Extender: Tito Dine in Treatment: 0 Information Obtained From Patient Constitutional Symptoms (General Health) Complaints and Symptoms: Negative for: Fatigue; Fever; Chills; Marked Weight Change Eyes Complaints and Symptoms: Negative for: Dry Eyes; Vision Changes; Glasses / Contacts Ear/Nose/Mouth/Throat Complaints and Symptoms: Negative for: Difficult clearing ears; Sinusitis Hematologic/Lymphatic Complaints and Symptoms: Negative for: Bleeding / Clotting Disorders; Human Immunodeficiency Virus Medical History: Negative for: Anemia; Hemophilia; Human Immunodeficiency Virus; Lymphedema; Sickle Cell Disease Past Medical History Notes: cyclic neutropenia Respiratory Complaints and Symptoms: Negative for: Chronic or frequent coughs; Shortness of Breath Cardiovascular Complaints and Symptoms: Negative for: Chest pain; LE edema Gastrointestinal Complaints and Symptoms: Negative for: Frequent diarrhea; Nausea; Vomiting Medical History: Negative  for: Cirrhosis ; Colitis; Crohnos; Hepatitis A; Hepatitis B; Hepatitis C 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 ([id number]) Complaints and Symptoms: Negative for: Hepatitis; Thyroid disease; Polydypsia (Excessive Thirst) Medical History: Positive for: Type II Diabetes Negative for: Type I Diabetes Treated with: Oral agents Blood sugar tested every day: No Genitourinary Complaints and Symptoms: Negative for: Kidney failure/ Dialysis; Incontinence/dribbling Immunological Complaints and Symptoms: Negative for: Hives; Itching Integumentary (Skin) Complaints and Symptoms: Positive for: Wounds Negative for: Bleeding or bruising tendency; Breakdown; Swelling Medical History: Negative for: History of Burn; History of pressure wounds Past Medical History Notes: stevens johnson syndrome Musculoskeletal Complaints and Symptoms: Negative for: Muscle Pain; Muscle Weakness Neurologic Complaints and Symptoms: Negative for: Numbness/parasthesias; Focal/Weakness Psychiatric Complaints and Symptoms: Negative for: Anxiety; Claustrophobia Immunizations Pneumococcal Vaccine: Received Pneumococcal Vaccination: No Immunization Notes: up to date Implantable Devices None Family and Social History Cancer: Yes - Maternal Grandparents; Diabetes: No; Heart Disease: Yes - Siblings,Father; Hereditary Spherocytosis: No; Hypertension: Yes - Father,Siblings; Kidney Disease: No;  Lung Disease: No; Seizures: No; Stroke: No; Thyroid Problems: Kenneth Cline, GASPARD. ([id number]) No; Tuberculosis: No; Former smoker - 20+ years; Alcohol Use: Never; Drug Use: No History; Caffeine Use: Daily; Financial Concerns: No; Food, Clothing or Shelter Needs: No; Support System Lacking: No; Transportation Concerns: No Electronic Signature(s) Signed: 12/11/2018 4:23:45 PM By: Montey Hora Signed: 12/11/2018 5:33:48 PM By: Linton Ham MD Entered By: Montey Hora on 12/11/2018  09:52:38 Kenneth Cline ([id number]) -------------------------------------------------------------------------------- [place] Details Patient Name: Kenneth Cline Date of Service: 12/11/2018 Medical Record Number: [id number] Patient Account Number: [id number] Date of Birth/Sex: Jan 26, 1956 (63 y.o. M) Treating RN: Cornell Barman Primary Care Provider: Jenna Luo Other Clinician: Referring Provider: Mahalia Longest Treating Provider/Extender: Tito Dine in Treatment: 0 Diagnosis Coding ICD-10 Codes Code Description L97.818 Non-pressure chronic ulcer of other part of right lower leg with other specified severity T81.31XA Disruption of external operation (surgical) wound, not elsewhere classified, initial encounter Facility Procedures CPT4 Code: 94076808 Description: 99213 - WOUND CARE VISIT-LEV 3 EST PT Modifier: Quantity: 1 Physician Procedures CPT4: Description Modifier Quantity Code 8110315 [id number] - WC PHYS LEVEL 4 - NEW PT 1 ICD-10 Diagnosis Description L97.818 Non-pressure chronic ulcer of other part of right lower leg with other specified severity T81.31XA Disruption of external operation  (surgical) wound, not elsewhere classified, initial encounter Electronic Signature(s) Signed: 12/11/2018 5:33:48 PM By: Linton Ham MD Entered By: Linton Ham on 12/11/2018 12:34:37

## 2018-12-12 NOTE — Progress Notes (Signed)
Kenneth Cline, Kenneth Cline ([id number]) Visit Report for 12/11/2018 Allergy List Details Patient Name: Kenneth Cline, Kenneth Cline Date of Service: 12/11/2018 9:45 AM Medical Record Number: [id number] Patient Account Number: [id number] Date of Birth/Sex: 06/21/1955 (63 y.o. M) Treating RN: Montey Hora Primary Care Valerio Pinard: Jenna Luo Other Clinician: Referring Kami Kube: Mahalia Longest Treating Xzavien Harada/Extender: Ricard Dillon Weeks in Treatment: 0 Allergies Active Allergies Biaxin Reaction: stevens johnson syndrome Keflex penicillin Allergy Notes Electronic Signature(s) Signed: 12/11/2018 4:23:45 PM By: Montey Hora Entered By: Montey Hora on 12/11/2018 09:54:42 Kenneth Cline ([id number]) -------------------------------------------------------------------------------- Arrival Information Details Patient Name: Kenneth Cline Date of Service: 12/11/2018 9:45 AM Medical Record Number: [id number] Patient Account Number: [id number] Date of Birth/Sex: 10-Oct-1955 (63 y.o. M) Treating RN: Cornell Barman Primary Care Courtnie Brenes: Jenna Luo Other Clinician: Referring Arinze Rivadeneira: Mahalia Longest Treating Treasa Bradshaw/Extender: Tito Dine in Treatment: 0 Visit Information Patient Arrived: Ambulatory Arrival Time: 09:37 Accompanied By: self Transfer Assistance: None Patient Identification Verified: Yes Secondary Verification Process Yes Completed: Patient Has Alerts: Yes Patient Alerts: Patient on Blood Thinner Warfarin ABI L 1.2 R 1.27 Electronic Signature(s) Signed: 12/11/2018 4:23:45 PM By: Montey Hora Entered By: Montey Hora on 12/11/2018 10:03:16 Kenneth Cline ([id number]) -------------------------------------------------------------------------------- Clinic Level of Care Assessment Details Patient Name: Kenneth Cline Date of Service: 12/11/2018 9:45 AM Medical Record Number: [id number] Patient Account Number: [id number] Date of Birth/Sex: May 24, 1955 (63 y.o.  M) Treating RN: Cornell Barman Primary Care Amaiyah Nordhoff: Jenna Luo Other Clinician: Referring Nori Winegar: Mahalia Longest Treating Shakyla Nolley/Extender: Tito Dine in Treatment: 0 Clinic Level of Care Assessment Items TOOL 1 Quantity Score []  - Use when EandM and Procedure is performed on INITIAL visit 0 ASSESSMENTS - Nursing Assessment / Reassessment X - General Physical Exam (combine w/ comprehensive assessment (listed just below) when 1 20 performed on new pt. evals) X- 1 25 Comprehensive Assessment (HX, ROS, Risk Assessments, Wounds Hx, etc.) ASSESSMENTS - Wound and Skin Assessment / Reassessment []  - Dermatologic / Skin Assessment (not related to wound area) 0 ASSESSMENTS - Ostomy and/or Continence Assessment and Care []  - Incontinence Assessment and Management 0 []  - 0 Ostomy Care Assessment and Management (repouching, etc.) PROCESS - Coordination of Care X - Simple Patient / Family Education for ongoing care 1 15 []  - 0 Complex (extensive) Patient / Family Education for ongoing care X- 1 10 Staff obtains Programmer, systems, Records, Test Results / Process Orders []  - 0 Staff telephones HHA, Nursing Homes / Clarify orders / etc []  - 0 Routine Transfer to another Facility (non-emergent condition) []  - 0 Routine Hospital Admission (non-emergent condition) X- 1 15 New Admissions / Biomedical engineer / Ordering NPWT, Apligraf, etc. []  - 0 Emergency Hospital Admission (emergent condition) PROCESS - Special Needs []  - Pediatric / Minor Patient Management 0 []  - 0 Isolation Patient Management []  - 0 Hearing / Language / Visual special needs []  - 0 Assessment of Community assistance (transportation, D/C planning, etc.) []  - 0 Additional assistance / Altered mentation []  - 0 Support Surface(s) Assessment (bed, cushion, seat, etc.) [name], [name] ([id number]) INTERVENTIONS - Miscellaneous []  - External ear exam 0 []  - 0 Patient Transfer (multiple staff / Civil Service fast streamer  / Similar devices) []  - 0 Simple Staple / Suture removal (25 or less) []  - 0 Complex Staple / Suture removal (26 or more) []  - 0 Hypo/Hyperglycemic Management (do not check if billed separately) []  - 0 Ankle / Brachial Index (ABI) - do not check if billed separately Has the patient been seen  at the hospital within the last three years: Yes Total Score: 85 Level Of Care: New/Established - Level 3 Electronic Signature(s) Signed: 12/11/2018 11:59:43 AM By: Gretta Cool, BSN, RN, CWS, Kim RN, BSN Entered By: Gretta Cool, BSN, RN, CWS, Kim on 12/11/2018 10:17:25 Kenneth Cline ([id number]) -------------------------------------------------------------------------------- Encounter Discharge Information Details Patient Name: Kenneth Cline Date of Service: 12/11/2018 9:45 AM Medical Record Number: [id number] Patient Account Number: [id number] Date of Birth/Sex: April 14, 1956 (63 y.o. M) Treating RN: Cornell Barman Primary Care Shaconda Hajduk: Jenna Luo Other Clinician: Referring Chantilly Linskey: Mahalia Longest Treating Yetzali Weld/Extender: Tito Dine in Treatment: 0 Encounter Discharge Information Items Discharge Condition: Stable Ambulatory Status: Ambulatory Discharge Destination: Home Transportation: Private Auto Accompanied By: self Schedule Follow-up Appointment: Yes Clinical Summary of Care: Electronic Signature(s) Signed: 12/11/2018 11:59:43 AM By: Gretta Cool, BSN, RN, CWS, Kim RN, BSN Entered By: Gretta Cool, BSN, RN, CWS, Kim on 12/11/2018 10:19:21 Kenneth Cline ([id number]) -------------------------------------------------------------------------------- Lower Extremity Assessment Details Patient Name: Kenneth Cline Date of Service: 12/11/2018 9:45 AM Medical Record Number: [id number] Patient Account Number: [id number] Date of Birth/Sex: 04/18/1956 (63 y.o. M) Treating RN: Montey Hora Primary Care Alioune Hodgkin: Jenna Luo Other Clinician: Referring Kaidon Kinker: Mahalia Longest Treating  Kharon Hixon/Extender: Tito Dine in Treatment: 0 Edema Assessment Assessed: [Left: No] [Right: No] Edema: [Left: No] [Right: No] Calf Left: Right: Point of Measurement: 31 cm From Medial Instep 34.3 cm 35.5 cm Ankle Left: Right: Point of Measurement: 11 cm From Medial Instep 22 cm 22 cm Vascular Assessment Pulses: Dorsalis Pedis Palpable: [Left:Yes] [Right:Yes] Posterior Tibial Palpable: [Left:Yes] [Right:Yes] Notes ABI at [facility] L 1.2 R 1.27 Electronic Signature(s) Signed: 12/11/2018 4:23:45 PM By: Montey Hora Entered By: Montey Hora on 12/11/2018 10:02:46 Kenneth Cline ([id number]) -------------------------------------------------------------------------------- Multi Wound Chart Details Patient Name: Kenneth Cline Date of Service: 12/11/2018 9:45 AM Medical Record Number: [id number] Patient Account Number: [id number] Date of Birth/Sex: 13-Jun-1955 (63 y.o. M) Treating RN: Cornell Barman Primary Care Chaselynn Kepple: Jenna Luo Other Clinician: Referring Lauryn Lizardi: Mahalia Longest Treating Derrek Puff/Extender: Tito Dine in Treatment: 0 Vital Signs Height(in): 69 Pulse(bpm): 83 Weight(lbs): 183 Blood Pressure(mmHg): 126/90 Body Mass Index(BMI): 27 Temperature(F): 98.3 Respiratory Rate 16 (breaths/min): Photos: Wound Location: Right Lower Leg - Medial, Right Lower Leg - Medial, Right Lower Leg - Medial Proximal Distal Wounding Event: Gradually Appeared Gradually Appeared Surgical Injury Primary Etiology: Cellulitis Cellulitis Open Surgical Wound Comorbid History: Type II Diabetes Type II Diabetes Type II Diabetes Date Acquired: 10/07/2018 10/07/2018 10/12/2018 Weeks of Treatment: 0 0 0 Wound Status: Open Open Open Measurements L x W x D 0.9x1x0.3 4.4x2.5x0.4 5.5x1x0.1 (cm) Area (cm) : 0.707 8.639 4.32 Volume (cm) : 0.212 3.456 0.432 Classification: Full Thickness Without Full Thickness Without Full Thickness Without Exposed Support  Structures Exposed Support Structures Exposed Support Structures Exudate Amount: Medium Medium Medium Exudate Type: Serous Serous Serous Exudate Color: amber amber amber Wound Margin: Flat and Intact Flat and Intact Flat and Intact Granulation Amount: Large (67-100%) Medium (34-66%) Large (67-100%) Granulation Quality: Pink Red Red Necrotic Amount: None Present (0%) Medium (34-66%) Small (1-33%) Exposed Structures: Fat Layer (Subcutaneous Fat Layer (Subcutaneous Fat Layer (Subcutaneous Tissue) Exposed: Yes Tissue) Exposed: Yes Tissue) Exposed: Yes Fascia: No Fascia: No Fascia: No Tendon: No Tendon: No Tendon: No Muscle: No Muscle: No Muscle: No Joint: No Joint: No Joint: No Bone: No Bone: No Bone: No Epithelialization: None None Small (1-33%) Schoenherr, Filipe L. ([id number]) Treatment Notes Wound #1 (Right, Proximal, Medial Lower Leg) Notes Hydrofera Blue, ABD, 3 layer, unna  paste. Wound #2 (Right, Distal, Medial Lower Leg) Notes Hydrofera Blue, ABD, 3 layer, unna paste. Wound #3 (Right, Medial Lower Leg) Notes Hydrofera Blue, ABD, 3 layer, unna paste. Electronic Signature(s) Signed: 12/11/2018 5:33:48 PM By: Linton Ham MD Previous Signature: 12/11/2018 11:59:43 AM Version By: Gretta Cool, BSN, RN, CWS, Kim RN, BSN Entered By: Linton Ham on 12/11/2018 12:22:57 Kenneth Cline ([id number]) -------------------------------------------------------------------------------- Multi-Disciplinary Care Plan Details Patient Name: Kenneth Cline Date of Service: 12/11/2018 9:45 AM Medical Record Number: [id number] Patient Account Number: [id number] Date of Birth/Sex: 01/11/1956 (63 y.o. M) Treating RN: Cornell Barman Primary Care Joselynne Killam: Jenna Luo Other Clinician: Referring Geraldine Tesar: Mahalia Longest Treating Mahira Petras/Extender: Tito Dine in Treatment: 0 Active Inactive Medication Nursing Diagnoses: Knowledge deficit related to medication safety: actual or  potential Goals: Patient/caregiver will demonstrate understanding of all current medications Date Initiated: 12/11/2018 Target Resolution Date: 12/11/2018 Goal Status: Active Interventions: Assess for medication contraindications each visit where new medications are prescribed Treatment Activities: New medication prescribed at [facility] : 12/11/2018 Notes: Necrotic Tissue Nursing Diagnoses: Impaired tissue integrity related to necrotic/devitalized tissue Goals: Necrotic/devitalized tissue will be minimized in the wound bed Date Initiated: 12/11/2018 Target Resolution Date: 12/10/2018 Goal Status: Active Interventions: Assess patient pain level pre-, during and post procedure and prior to discharge Treatment Activities: Excisional debridement : 12/11/2018 Notes: Soft Tissue Infection Nursing Diagnoses: Impaired tissue integrity Potential for infection: soft tissue Kisamore, Gianny L. ([id number]) Goals: Patient will remain free of wound infection Date Initiated: 12/11/2018 Target Resolution Date: 12/18/2018 Goal Status: Active Interventions: Assess signs and symptoms of infection every visit Notes: Wound/Skin Impairment Nursing Diagnoses: Impaired tissue integrity Goals: Patient/caregiver will verbalize understanding of skin care regimen Date Initiated: 12/11/2018 Target Resolution Date: 12/18/2018 Goal Status: Active Ulcer/skin breakdown will have a volume reduction of 30% by week 4 Date Initiated: 12/11/2018 Target Resolution Date: 01/11/2019 Goal Status: Active Interventions: Assess ulceration(s) every visit Treatment Activities: Referred to DME Calvyn Kurtzman for dressing supplies : 12/11/2018 Skin care regimen initiated : 12/11/2018 Notes: Electronic Signature(s) Signed: 12/11/2018 11:59:43 AM By: Gretta Cool, BSN, RN, CWS, Kim RN, BSN Entered By: Gretta Cool, BSN, RN, CWS, Kim on 12/11/2018 10:11:49 Kenneth Cline  ([id number]) -------------------------------------------------------------------------------- Pain Assessment Details Patient Name: Kenneth Cline Date of Service: 12/11/2018 9:45 AM Medical Record Number: [id number] Patient Account Number: [id number] Date of Birth/Sex: Oct 20, 1955 (63 y.o. M) Treating RN: Cornell Barman Primary Care Golda Zavalza: Jenna Luo Other Clinician: Referring Lawren Sexson: Mahalia Longest Treating Madalyne Husk/Extender: Tito Dine in Treatment: 0 Active Problems Location of Pain Severity and Description of Pain Patient Has Paino No Site Locations Pain Management and Medication Current Pain Management: Electronic Signature(s) Signed: 12/11/2018 11:59:43 AM By: Gretta Cool, BSN, RN, CWS, Kim RN, BSN Signed: 12/11/2018 4:09:28 PM By: Lorine Bears RCP, RRT, CHT Entered By: Lorine Bears on 12/11/2018 09:37:50 Casten, Campbell Cline ([id number]) -------------------------------------------------------------------------------- Patient/Caregiver Education Details Patient Name: Kenneth Cline Date of Service: 12/11/2018 9:45 AM Medical Record Number: [id number] Patient Account Number: [id number] Date of Birth/Gender: 1956-03-02 (63 y.o. M) Treating RN: Cornell Barman Primary Care Physician: Jenna Luo Other Clinician: Referring Physician: Mahalia Longest Treating Physician/Extender: Tito Dine in Treatment: 0 Education Assessment Education Provided To: Patient Education Topics Provided Venous: Handouts: Controlling Swelling with Multilayered Compression Wraps Methods: Demonstration, Explain/Verbal Responses: State content correctly Welcome [facility]: Handouts: Welcome To [facility] Methods: Explain/Verbal Responses: State content correctly Wound/Skin Impairment: Handouts: Caring for Your Ulcer Methods: Demonstration, Explain/Verbal Responses: State content correctly Electronic  Signature(s) Signed:  12/11/2018 11:59:43 AM By: Gretta Cool, BSN, RN, CWS, Kim RN, BSN Entered By: Gretta Cool, BSN, RN, CWS, Kim on 12/11/2018 10:18:09 Kenneth Cline ([id number]) -------------------------------------------------------------------------------- Wound Assessment Details Patient Name: Kenneth Cline Date of Service: 12/11/2018 9:45 AM Medical Record Number: [id number] Patient Account Number: [id number] Date of Birth/Sex: 11/28/1955 (63 y.o. M) Treating RN: Montey Hora Primary Care Mattox Schorr: Jenna Luo Other Clinician: Referring Sahara Fujimoto: Mahalia Longest Treating Duana Benedict/Extender: Tito Dine in Treatment: 0 Wound Status Wound Number: 1 Primary Etiology: Cellulitis Wound Location: Right Lower Leg - Medial, Proximal Wound Status: Open Wounding Event: Gradually Appeared Comorbid History: Type II Diabetes Date Acquired: 10/07/2018 Weeks Of Treatment: 0 Clustered Wound: No Photos Wound Measurements Length: (cm) 0.9 Width: (cm) 1 Depth: (cm) 0.3 Area: (cm) 0.707 Volume: (cm) 0.212 % Reduction in Area: % Reduction in Volume: Epithelialization: None Tunneling: No Undermining: No Wound Description Full Thickness Without Exposed Support Classification: Structures Wound Margin: Flat and Intact Exudate Medium Amount: Exudate Type: Serous Exudate Color: amber Foul Odor After Cleansing: No Slough/Fibrino No Wound Bed Granulation Amount: Large (67-100%) Exposed Structure Granulation Quality: Pink Fascia Exposed: No Necrotic Amount: None Present (0%) Fat Layer (Subcutaneous Tissue) Exposed: Yes Tendon Exposed: No Muscle Exposed: No Joint Exposed: No Bone Exposed: No Poch, Michaelangelo L. ([id number]) Treatment Notes Wound #1 (Right, Proximal, Medial Lower Leg) Notes Hydrofera Blue, ABD, 3 layer, unna paste. Electronic Signature(s) Signed: 12/11/2018 4:23:45 PM By: Montey Hora Entered By: Montey Hora on 12/11/2018 09:56:43 Fano, Campbell Cline  ([id number]) -------------------------------------------------------------------------------- Wound Assessment Details Patient Name: Kenneth Cline Date of Service: 12/11/2018 9:45 AM Medical Record Number: [id number] Patient Account Number: [id number] Date of Birth/Sex: [birth year] (63 y.o. M) Treating RN: Montey Hora Primary Care Shamona Wirtz: Jenna Luo Other Clinician: Referring Belicia Difatta: Mahalia Longest Treating Lezlie Ritchey/Extender: Tito Dine in Treatment: 0 Wound Status Wound Number: 2 Primary Etiology: Cellulitis Wound Location: Right Lower Leg - Medial, Distal Wound Status: Open Wounding Event: Gradually Appeared Comorbid History: Type II Diabetes Date Acquired: 10/07/2018 Weeks Of Treatment: 0 Clustered Wound: No Photos Wound Measurements Length: (cm) 4.4 Width: (cm) 2.5 Depth: (cm) 0.4 Area: (cm) 8.639 Volume: (cm) 3.456 % Reduction in Area: % Reduction in Volume: Epithelialization: None Tunneling: No Undermining: No Wound Description Full Thickness Without Exposed Support Classification: Structures Wound Margin: Flat and Intact Exudate Medium Amount: Exudate Type: Serous Exudate Color: amber Foul Odor After Cleansing: No Slough/Fibrino Yes Wound Bed Granulation Amount: Medium (34-66%) Exposed Structure Granulation Quality: Red Fascia Exposed: No Necrotic Amount: Medium (34-66%) Fat Layer (Subcutaneous Tissue) Exposed: Yes Necrotic Quality: Adherent Slough Tendon Exposed: No Muscle Exposed: No Joint Exposed: No Bone Exposed: No Oppedisano, Mell L. ([id number]) Treatment Notes Wound #2 (Right, Distal, Medial Lower Leg) Notes Hydrofera Blue, ABD, 3 layer, unna paste. Electronic Signature(s) Signed: 12/11/2018 4:23:45 PM By: Montey Hora Entered By: Montey Hora on 12/11/2018 09:59:30 Marinaro, Campbell Cline ([id number]) -------------------------------------------------------------------------------- Wound Assessment Details Patient Name:  Kenneth Cline Date of Service: 12/11/2018 9:45 AM Medical Record Number: [id number] Patient Account Number: [id number] Date of Birth/Sex: 11/09/1955 (63 y.o. M) Treating RN: Montey Hora Primary Care Arran Fessel: Jenna Luo Other Clinician: Referring Liara Holm: Mahalia Longest Treating Shareef Eddinger/Extender: Tito Dine in Treatment: 0 Wound Status Wound Number: 3 Primary Etiology: Open Surgical Wound Wound Location: Right Lower Leg - Medial Wound Status: Open Wounding Event: Surgical Injury Comorbid History: Type II Diabetes Date Acquired: 10/12/2018 Weeks Of Treatment: 0 Clustered Wound: No Photos Wound Measurements Length: (cm) 5.5 Width: (cm) 1 Depth: (  cm) 0.1 Area: (cm) 4.32 Volume: (cm) 0.432 % Reduction in Area: % Reduction in Volume: Epithelialization: Small (1-33%) Tunneling: No Undermining: No Wound Description Full Thickness Without Exposed Support Classification: Structures Wound Margin: Flat and Intact Exudate Medium Amount: Exudate Type: Serous Exudate Color: amber Foul Odor After Cleansing: No Slough/Fibrino Yes Wound Bed Granulation Amount: Large (67-100%) Exposed Structure Granulation Quality: Red Fascia Exposed: No Necrotic Amount: Small (1-33%) Fat Layer (Subcutaneous Tissue) Exposed: Yes Necrotic Quality: Adherent Slough Tendon Exposed: No Muscle Exposed: No Joint Exposed: No Bone Exposed: No Busk, Nikolis L. ([id number]) Treatment Notes Wound #3 (Right, Medial Lower Leg) Notes Hydrofera Blue, ABD, 3 layer, unna paste. Electronic Signature(s) Signed: 12/11/2018 4:23:45 PM By: Montey Hora Entered By: Montey Hora on 12/11/2018 10:00:46 Kenneth Cline ([id number]) -------------------------------------------------------------------------------- Vitals Details Patient Name: Kenneth Cline Date of Service: 12/11/2018 9:45 AM Medical Record Number: [id number] Patient Account Number: [id number] Date of Birth/Sex: 17-Mar-1956  (63 y.o. M) Treating RN: Cornell Barman Primary Care Forrester Blando: Jenna Luo Other Clinician: Referring Timeka Goette: Mahalia Longest Treating Ily Denno/Extender: Tito Dine in Treatment: 0 Vital Signs Time Taken: 09:35 Temperature (F): 98.3 Height (in): 69 Pulse (bpm): 83 Source: Stated Respiratory Rate (breaths/min): 16 Weight (lbs): 183 Blood Pressure (mmHg): 126/90 Source: Measured Reference Range: 80 - 120 mg / dl Body Mass Index (BMI): 27 Electronic Signature(s) Signed: 12/11/2018 4:09:28 PM By: Lorine Bears RCP, RRT, CHT Entered By: Lorine Bears on 12/11/2018 09:39:15

## 2018-12-13 DIAGNOSIS — Z48817 Encounter for surgical aftercare following surgery on the skin and subcutaneous tissue: Secondary | ICD-10-CM | POA: Diagnosis not present

## 2018-12-13 DIAGNOSIS — L03115 Cellulitis of right lower limb: Secondary | ICD-10-CM | POA: Diagnosis not present

## 2018-12-16 ENCOUNTER — Ambulatory Visit (INDEPENDENT_AMBULATORY_CARE_PROVIDER_SITE_OTHER): Payer: Medicare HMO | Admitting: Cardiology

## 2018-12-16 DIAGNOSIS — Z7901 Long term (current) use of anticoagulants: Secondary | ICD-10-CM

## 2018-12-16 DIAGNOSIS — N280 Ischemia and infarction of kidney: Secondary | ICD-10-CM | POA: Diagnosis not present

## 2018-12-16 DIAGNOSIS — E1165 Type 2 diabetes mellitus with hyperglycemia: Secondary | ICD-10-CM | POA: Diagnosis not present

## 2018-12-16 DIAGNOSIS — I823 Embolism and thrombosis of renal vein: Secondary | ICD-10-CM | POA: Diagnosis not present

## 2018-12-16 DIAGNOSIS — D6862 Lupus anticoagulant syndrome: Secondary | ICD-10-CM | POA: Diagnosis not present

## 2018-12-16 DIAGNOSIS — B9561 Methicillin susceptible Staphylococcus aureus infection as the cause of diseases classified elsewhere: Secondary | ICD-10-CM | POA: Diagnosis not present

## 2018-12-16 DIAGNOSIS — K8301 Primary sclerosing cholangitis: Secondary | ICD-10-CM | POA: Diagnosis not present

## 2018-12-16 DIAGNOSIS — E272 Addisonian crisis: Secondary | ICD-10-CM | POA: Diagnosis not present

## 2018-12-16 DIAGNOSIS — L03115 Cellulitis of right lower limb: Secondary | ICD-10-CM | POA: Diagnosis not present

## 2018-12-16 DIAGNOSIS — E039 Hypothyroidism, unspecified: Secondary | ICD-10-CM | POA: Diagnosis not present

## 2018-12-16 DIAGNOSIS — Z48817 Encounter for surgical aftercare following surgery on the skin and subcutaneous tissue: Secondary | ICD-10-CM | POA: Diagnosis not present

## 2018-12-16 DIAGNOSIS — L511 Stevens-Johnson syndrome: Secondary | ICD-10-CM | POA: Diagnosis not present

## 2018-12-16 LAB — POCT INR: INR: 2.3 (ref 2.0–3.0)

## 2018-12-18 ENCOUNTER — Other Ambulatory Visit: Payer: Self-pay

## 2018-12-18 ENCOUNTER — Encounter: Payer: Medicare HMO | Admitting: Internal Medicine

## 2018-12-18 DIAGNOSIS — Z86718 Personal history of other venous thrombosis and embolism: Secondary | ICD-10-CM | POA: Diagnosis not present

## 2018-12-18 DIAGNOSIS — Z87891 Personal history of nicotine dependence: Secondary | ICD-10-CM | POA: Diagnosis not present

## 2018-12-18 DIAGNOSIS — Z6827 Body mass index (BMI) 27.0-27.9, adult: Secondary | ICD-10-CM | POA: Diagnosis not present

## 2018-12-18 DIAGNOSIS — E11622 Type 2 diabetes mellitus with other skin ulcer: Secondary | ICD-10-CM | POA: Diagnosis not present

## 2018-12-18 DIAGNOSIS — Y838 Other surgical procedures as the cause of abnormal reaction of the patient, or of later complication, without mention of misadventure at the time of the procedure: Secondary | ICD-10-CM | POA: Diagnosis not present

## 2018-12-18 DIAGNOSIS — L03115 Cellulitis of right lower limb: Secondary | ICD-10-CM | POA: Diagnosis not present

## 2018-12-18 DIAGNOSIS — L97818 Non-pressure chronic ulcer of other part of right lower leg with other specified severity: Secondary | ICD-10-CM | POA: Diagnosis not present

## 2018-12-18 DIAGNOSIS — T8131XA Disruption of external operation (surgical) wound, not elsewhere classified, initial encounter: Secondary | ICD-10-CM | POA: Diagnosis not present

## 2018-12-18 DIAGNOSIS — E039 Hypothyroidism, unspecified: Secondary | ICD-10-CM | POA: Diagnosis not present

## 2018-12-18 DIAGNOSIS — L511 Stevens-Johnson syndrome: Secondary | ICD-10-CM | POA: Diagnosis not present

## 2018-12-18 DIAGNOSIS — T8189XA Other complications of procedures, not elsewhere classified, initial encounter: Secondary | ICD-10-CM | POA: Diagnosis not present

## 2018-12-18 DIAGNOSIS — Z7901 Long term (current) use of anticoagulants: Secondary | ICD-10-CM | POA: Diagnosis not present

## 2018-12-18 NOTE — Progress Notes (Addendum)
Kenneth Cline, Kenneth Cline ([id number]) Visit Report for 12/18/2018 Debridement Details Patient Name: Kenneth Cline, Kenneth Cline Date of Service: 12/18/2018 8:30 AM Medical Record Number: [id number] Patient Account Number: [id number] Date of Birth/Sex: 01-03-1956 (63 y.o. M) Treating RN: Harold Barban Primary Care Provider: Jenna Luo Other Clinician: Referring Provider: Jenna Luo Treating Provider/Extender: Tito Dine in Treatment: 1 Debridement Performed for Wound #2 Right,Distal,Medial Lower Leg Assessment: Performed By: Physician Ricard Dillon, MD Debridement Type: Debridement Level of Consciousness (Pre- Awake and Alert procedure): Pre-procedure Verification/Time Yes - 08:44 Out Taken: Start Time: 08:44 Pain Control: Lidocaine Total Area Debrided (L x W): 3.5 (cm) x 2.1 (cm) = 7.35 (cm) Tissue and other material Non-Viable, Callus, Eschar debrided: Level: Non-Viable Tissue Debridement Description: Selective/Open Wound Instrument: Curette Bleeding: None End Time: 08:46 Procedural Pain: 0 Post Procedural Pain: 0 Response to Treatment: Procedure was tolerated well Level of Consciousness Awake and Alert (Post-procedure): Post Debridement Measurements of Total Wound Length: (cm) 3.5 Width: (cm) 2.1 Depth: (cm) 0.2 Volume: (cm) 1.155 Character of Wound/Ulcer Post Debridement: Improved Post Procedure Diagnosis Same as Pre-procedure Electronic Signature(s) Signed: 12/18/2018 4:23:12 PM By: Harold Barban Signed: 12/18/2018 4:56:01 PM By: Linton Ham MD Entered By: Linton Ham on 12/18/2018 08:47:48 Druck, Kenneth Cline ([id number]) -------------------------------------------------------------------------------- Debridement Details Patient Name: Kenneth Cline Date of Service: 12/18/2018 8:30 AM Medical Record Number: [id number] Patient Account Number: [id number] Date of Birth/Sex: 26-Jun-1955 (63 y.o. M) Treating RN: Harold Barban Primary Care Provider:  Jenna Luo Other Clinician: Referring Provider: Jenna Luo Treating Provider/Extender: Tito Dine in Treatment: 1 Debridement Performed for Wound #3 Right,Medial Lower Leg Assessment: Performed By: Physician Ricard Dillon, MD Debridement Type: Debridement Level of Consciousness (Pre- Awake and Alert procedure): Pre-procedure Verification/Time Yes - 08:44 Out Taken: Start Time: 08:44 Pain Control: Lidocaine Total Area Debrided (L x W): 5 (cm) x 0.8 (cm) = 4 (cm) Tissue and other material Non-Viable, Callus, Eschar debrided: Level: Non-Viable Tissue Debridement Description: Selective/Open Wound Instrument: Curette Bleeding: None Procedural Pain: 0 Post Procedural Pain: 0 Response to Treatment: Procedure was tolerated well Level of Consciousness Awake and Alert (Post-procedure): Post Debridement Measurements of Total Wound Length: (cm) 5 Width: (cm) 0.8 Depth: (cm) 0.1 Volume: (cm) 0.314 Character of Wound/Ulcer Post Debridement: Improved Post Procedure Diagnosis Same as Pre-procedure Electronic Signature(s) Signed: 12/18/2018 4:23:12 PM By: Harold Barban Signed: 12/18/2018 4:56:01 PM By: Linton Ham MD Entered By: Linton Ham on 12/18/2018 08:47:59 Kenneth Cline, Kenneth Cline ([id number]) -------------------------------------------------------------------------------- HPI Details Patient Name: Kenneth Cline Date of Service: 12/18/2018 8:30 AM Medical Record Number: [id number] Patient Account Number: [id number] Date of Birth/Sex: [birth year] (63 y.o. M) Treating RN: Harold Barban Primary Care Provider: Jenna Luo Other Clinician: Referring Provider: Jenna Luo Treating Provider/Extender: Tito Dine in Treatment: 1 History of Present Illness HPI Description: ADMISSION 12/11/2018 This is a 63 year old man who is a type II diabetic. He apparently developed a "boil" on his right lower leg sometime in mid June. He  subsequently went to the beach and was in the ocean. He was admitted to [facility] from 6/20 through 6/30 with septic shock felt to be secondary from cellulitis in the right lower leg. There was concern for compartment syndrome and on 6/20 he underwent a right leg fasciotomy by Dr. Ardeth Sportsman of surgery. He received different courses of antibiotics. As far as I am able to determine the wound had an MSSA infection based on follow-up notes from general surgery but I cannot see the actual culture result. In  any case he was discharged on doxycycline. He also had a wound VAC on this for a period of time. He saw general surgery in follow-up on 11/14/2018 and I am able to see this note in [place] link. He had an "right lower extremity MSSA infection". At that point his wounds were 2 measuring 3 x 4 x 5x2 wound VAC was discontinued at that time and he was put on a wet to dry dressing. He also was given clindamycin for the recurrent wound infection. He was scheduled actually to go to the OR for further debridement although I cannot see that this was actually done. He tells me he went to the wound care center at [facility] on 3 occasions. By the time he was seen at the wound care center on 8 5 there were 3 wounds all requiring debridement. They were dressed with Iodosorb and poly-man. On 8/12 a wound culture was done that showed light Pseudomonas and a few Staphylococcus although he was not started on antibiotics out of concern for a drug interaction with Coumadin. He actually lives in [place] so he is come to our clinic today for follow-up. 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 His ABIs in our clinic were 1.27 on the right and 1.2 on  the left. 8/26; apparently home health was not moistening the Hydrofera Blue classic so it stuck to the wound nevertheless the major wound is down in size. There are 3 open areas the original surgical site and then just laterally to this a more proximal small wound and more distally a larger wound. I did not put him on antibiotics last week has nothing look infected. Still nothing looks infected today Electronic Signature(s) Signed: 12/19/2018 3:48:32 PM By: Gretta Cool, BSN, RN, CWS, Kim RN, BSN Signed: 12/20/2018 5:26:07 PM By: Linton Ham MD Previous Signature: 12/18/2018 4:56:01 PM Version By: Linton Ham MD Entered By: Gretta Cool, BSN, RN, CWS, Kim on 12/19/2018 15:48:32 Kenneth Cline, Kenneth Cline ([id number]) -------------------------------------------------------------------------------- Physical Exam Details Patient Name: Kenneth Cline Date of Service: 12/18/2018 8:30 AM Medical Record Number: [id number] Patient Account Number: [id number] Date of Birth/Sex: 03/31/1956 (63 y.o. M) Treating RN: Harold Barban Primary Care Provider: Jenna Luo Other Clinician: Referring Provider: Jenna Luo Treating Provider/Extender: Tito Dine in Treatment: 1 Notes Wound exam; the patient has the 2 wounds just medial to the tibia small wound proximally and the larger wound distally then he has a surgical site incision more medial to those 2. The surgical site incision and the small proximal wound both require debridement with a #5 curette predominantly to remove eschar and surface slough. The major wound did not require debridement is come down nicely in size. As noted there is no evidence of infection. Edema control is excellent Electronic Signature(s) Signed: 12/18/2018 4:56:01 PM By: Linton Ham MD Entered By: Linton Ham on 12/18/2018 08:51:11 Kenneth Cline ([id number]) -------------------------------------------------------------------------------- Physician Orders  Details Patient Name: Kenneth Cline Date of Service: 12/18/2018 8:30 AM Medical Record Number: [id number] Patient Account Number: [id number] Date of Birth/Sex: 1956-03-09 (63 y.o. M) Treating RN: Harold Barban Primary Care Provider: Jenna Luo Other Clinician: Referring Provider: Jenna Luo Treating Provider/Extender: Tito Dine in Treatment: 1 Verbal / Phone Orders: No Diagnosis Coding ICD-10 Coding Code Description L97.818 Non-pressure chronic ulcer of other part of  right lower leg with other specified severity T81.31XA Disruption of external operation (surgical) wound, not elsewhere classified, initial encounter E11.622 Type 2 diabetes mellitus with other skin ulcer Wound Cleansing Wound #1 Right,Proximal,Medial Lower Leg o Cleanse wound with mild soap and water o May Shower, gently pat wound dry prior to applying new dressing. Wound #2 Right,Distal,Medial Lower Leg o Cleanse wound with mild soap and water o May Shower, gently pat wound dry prior to applying new dressing. Wound #3 Right,Medial Lower Leg o Cleanse wound with mild soap and water o May Shower, gently pat wound dry prior to applying new dressing. Anesthetic (add to Medication List) Wound #1 Right,Proximal,Medial Lower Leg o Topical Lidocaine 4% cream applied to wound bed prior to debridement (In Clinic Only). Wound #2 Right,Distal,Medial Lower Leg o Topical Lidocaine 4% cream applied to wound bed prior to debridement (In Clinic Only). Wound #3 Right,Medial Lower Leg o Topical Lidocaine 4% cream applied to wound bed prior to debridement (In Clinic Only). Primary Wound Dressing Wound #1 Right,Proximal,Medial Lower Leg o Hydrafera Blue Ready Transfer Wound #2 Right,Distal,Medial Lower Leg o Hydrafera Blue Ready Transfer Wound #3 Right,Medial Lower Leg o Hydrafera Blue Ready Transfer Secondary Dressing Wound #1 Right,Proximal,Medial Lower Leg o ABD pad Kenneth Cline,  Kenneth L. ([id number]) Wound #2 Right,Distal,Medial Lower Leg o ABD pad Wound #3 Right,Medial Lower Leg o ABD pad Dressing Change Frequency Wound #1 Right,Proximal,Medial Lower Leg o Change Dressing Monday, Wednesday, Friday Wound #2 Right,Distal,Medial Lower Leg o Change Dressing Monday, Wednesday, Friday Wound #3 Right,Medial Lower Leg o Change Dressing Monday, Wednesday, Friday Follow-up Appointments Wound #1 Right,Proximal,Medial Lower Leg o Return Appointment in 1 week. Wound #2 Right,Distal,Medial Lower Leg o Return Appointment in 1 week. Wound #3 Right,Medial Lower Leg o Return Appointment in 1 week. Edema Control Wound #1 Right,Proximal,Medial Lower Leg o 3 Layer Compression System - Right Lower Extremity - May use 2 layer at home o Elevate legs to the level of the heart and pump ankles as often as possible Wound #2 Right,Distal,Medial Lower Leg o 3 Layer Compression System - Right Lower Extremity - May use 2 layer at home o Elevate legs to the level of the heart and pump ankles as often as possible Wound #3 Right,Medial Lower Leg o 3 Layer Compression System - Right Lower Extremity - May use 2 layer at home o Elevate legs to the level of the heart and pump ankles as often as possible Home Health Wound #1 Right,Proximal,Medial Lower Leg o [place] Visits - [place] Nurse may visit PRN to address patientos wound care needs. o FACE TO FACE ENCOUNTER: MEDICARE and MEDICAID PATIENTS: I certify that this patient is under my care and that I had a face-to-face encounter that meets the physician face-to-face encounter requirements with this patient on this date. The encounter with the patient was in whole or in part for the following MEDICAL CONDITION: (primary reason for [place]) MEDICAL NECESSITY: I certify, that based on my findings, NURSING services are a medically necessary home health service. HOME BOUND  STATUS: I certify that my clinical findings support that this patient is homebound (i.e., Due to illness or injury, pt requires aid of supportive devices such as crutches, cane, wheelchairs, walkers, the use of special transportation or the assistance of another person to leave their place of residence. There is a normal inability to leave the home and doing so requires considerable and taxing effort. Other absences are for medical reasons / religious services and  are infrequent or of short duration when for other reasons). - If not using the the Ready, Please moisten with saline. Kenneth Cline, Kenneth Cline ([id number]) o If current dressing causes regression in wound condition, may D/C ordered dressing product/s and apply Normal Saline Moist Dressing daily until next [place] / Other MD appointment. [place] of regression in wound condition at [phone]. o Please direct any NON-WOUND related issues/requests for orders to patient's Primary Care Physician Wound #2 Right,Distal,Medial Lower Leg o [place] Visits - [place] Nurse may visit PRN to address patientos wound care needs. o FACE TO FACE ENCOUNTER: MEDICARE and MEDICAID PATIENTS: I certify that this patient is under my care and that I had a face-to-face encounter that meets the physician face-to-face encounter requirements with this patient on this date. The encounter with the patient was in whole or in part for the following MEDICAL CONDITION: (primary reason for [place]) MEDICAL NECESSITY: I certify, that based on my findings, NURSING services are a medically necessary home health service. HOME BOUND STATUS: I certify that my clinical findings support that this patient is homebound (i.e., Due to illness or injury, pt requires aid of supportive devices such as crutches, cane, wheelchairs, walkers, the use of special transportation or the assistance of another person to  leave their place of residence. There is a normal inability to leave the home and doing so requires considerable and taxing effort. Other absences are for medical reasons / religious services and are infrequent or of short duration when for other reasons). o If current dressing causes regression in wound condition, may D/C ordered dressing product/s and apply Normal Saline Moist Dressing daily until next [place] / Other MD appointment. [place] of regression in wound condition at [phone]. o Please direct any NON-WOUND related issues/requests for orders to patient's Primary Care Physician Wound #3 Right,Medial Lower Leg o [place] Visits - [place] Nurse may visit PRN to address patientos wound care needs. o FACE TO FACE ENCOUNTER: MEDICARE and MEDICAID PATIENTS: I certify that this patient is under my care and that I had a face-to-face encounter that meets the physician face-to-face encounter requirements with this patient on this date. The encounter with the patient was in whole or in part for the following MEDICAL CONDITION: (primary reason for [place]) MEDICAL NECESSITY: I certify, that based on my findings, NURSING services are a medically necessary home health service. HOME BOUND STATUS: I certify that my clinical findings support that this patient is homebound (i.e., Due to illness or injury, pt requires aid of supportive devices such as crutches, cane, wheelchairs, walkers, the use of special transportation or the assistance of another person to leave their place of residence. There is a normal inability to leave the home and doing so requires considerable and taxing effort. Other absences are for medical reasons / religious services and are infrequent or of short duration when for other reasons). o If current dressing causes regression in wound condition, may D/C ordered dressing product/s and apply Normal  Saline Moist Dressing daily until next [place] / Other MD appointment. [place] of regression in wound condition at [phone]. o Please direct any NON-WOUND related issues/requests for orders to patient's Primary Care Physician Electronic Signature(s) Signed: 12/18/2018 4:23:12 PM By: Harold Barban Signed: 12/18/2018 4:56:01 PM By: Linton Ham MD Entered By: Harold Barban on 12/18/2018 08:50:30 Kenneth Cline, Kenneth Cline ([id number]) -------------------------------------------------------------------------------- Problem  List Details Patient Name: Kenneth Cline, Kenneth Cline Date of Service: 12/18/2018 8:30 AM Medical Record Number: [id number] Patient Account Number: [id number] Date of Birth/Sex: [birth year] (63 y.o. M) Treating RN: Harold Barban Primary Care Provider: Jenna Luo Other Clinician: Referring Provider: Jenna Luo Treating Provider/Extender: Tito Dine in Treatment: 1 Active Problems ICD-10 Evaluated Encounter Code Description Active Date Today Diagnosis L97.818 Non-pressure chronic ulcer of other part of right lower leg 12/11/2018 No Yes with other specified severity T81.31XA Disruption of external operation (surgical) wound, not 12/11/2018 No Yes elsewhere classified, initial encounter E11.622 Type 2 diabetes mellitus with other skin ulcer 12/18/2018 No Yes Inactive Problems Resolved Problems Electronic Signature(s) Signed: 12/18/2018 4:56:01 PM By: Linton Ham MD Entered By: Linton Ham on 12/18/2018 08:35:26 Kenneth Cline, Kenneth Cline ([id number]) -------------------------------------------------------------------------------- Progress Note Details Patient Name: Kenneth Cline Date of Service: 12/18/2018 8:30 AM Medical Record Number: [id number] Patient Account Number: [id number] Date of Birth/Sex: [birth year] (63 y.o. M) Treating RN: Harold Barban Primary Care Provider: Jenna Luo Other Clinician: Referring Provider:  Jenna Luo Treating Provider/Extender: Tito Dine in Treatment: 1 Subjective History of Present Illness (HPI) ADMISSION 12/11/2018 This is a 63 year old man who is a type II diabetic. He apparently developed a "boil" on his right lower leg sometime in mid June. He subsequently went to the beach and was in the ocean. He was admitted to [facility] from 6/20 through 6/30 with septic shock felt to be secondary from cellulitis in the right lower leg. There was concern for compartment syndrome and on 6/20 he underwent a right leg fasciotomy by Dr. Ardeth Sportsman of surgery. He received different courses of antibiotics. As far as I am able to determine the wound had an MSSA infection based on follow-up notes from general surgery but I cannot see the actual culture result. In any case he was discharged on doxycycline. He also had a wound VAC on this for a period of time. He saw general surgery in follow-up on 11/14/2018 and I am able to see this note in [place] link. He had an "right lower extremity MSSA infection". At that point his wounds were 2 measuring 3 x 4 x 5x2 wound VAC was discontinued at that time and he was put on a wet to dry dressing. He also was given clindamycin for the recurrent wound infection. He was scheduled actually to go to the OR for further debridement although I cannot see that this was actually done. He tells me he went to the wound care center at [facility] on 3 occasions. By the time he was seen at the wound care center on 8 5 there were 3 wounds all requiring debridement. They were dressed with Iodosorb and poly-man. On 8/12 a wound culture was done that showed light Pseudomonas and a few Staphylococcus although he was not started on antibiotics out of concern for a drug interaction with Coumadin. He actually lives in [place] so he is come to our clinic today for follow-up. 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 His ABIs in our clinic were 1.27 on the right and 1.2 on the left. 8/26; apparently home health was not moistening the Hydrofera Blue classic so it stuck to the wound nevertheless the major wound is down in size. There are 3 open areas the original surgical  site and then just laterally to this a more proximal small wound and more distally a larger wound. I did not put him on antibiotics last week has nothing look infected. Still nothing looks infected today Objective Constitutional Vitals Time Taken: 8:28 AM, Height: 69 in, Weight: 183 lbs, BMI: 27, Temperature: 98.9 F, Pulse: 96 bpm, Respiratory Rate: 16 breaths/min, Blood Pressure: 132/93 mmHg. Integumentary (Hair, Skin) Wound #1 status is Open. Original cause of wound was Gradually Appeared. The wound is located on the [place], [place]. ([id number]) Right,Proximal,Medial Lower Leg. The wound measures 0.9cm length x 0.6cm width x 0.2cm depth; 0.424cm^2 area and 0.085cm^3 volume. There is Fat Layer (Subcutaneous Tissue) Exposed exposed. There is no tunneling or undermining noted. There is a medium amount of serous drainage noted. The wound margin is flat and intact. There is medium (34-66%) pink granulation within the wound bed. There is a medium (34-66%) amount of necrotic tissue within the wound bed including Eschar and Adherent Slough. Wound #2 status is Open. Original cause of wound was Gradually Appeared. The wound is located on the Right,Distal,Medial Lower Leg. The wound measures 3.5cm length x 2.1cm width x 0.2cm depth; 5.773cm^2 area and 1.155cm^3 volume. There is Fat Layer (Subcutaneous Tissue) Exposed exposed. There is no tunneling or undermining noted. There is a medium amount of serous drainage noted.  The wound margin is flat and intact. There is large (67-100%) red granulation within the wound bed. There is a small (1-33%) amount of necrotic tissue within the wound bed including Adherent Slough. Wound #3 status is Open. Original cause of wound was Surgical Injury. The wound is located on the Right,Medial Lower Leg. The wound measures 5cm length x 0.8cm width x 0.1cm depth; 3.142cm^2 area and 0.314cm^3 volume. There is Fat Layer (Subcutaneous Tissue) Exposed exposed. There is no tunneling or undermining noted. There is a medium amount of serous drainage noted. The wound margin is flat and intact. There is large (67-100%) red granulation within the wound bed. There is a small (1-33%) amount of necrotic tissue within the wound bed including Adherent Slough. Assessment Active Problems ICD-10 Non-pressure chronic ulcer of other part of right lower leg with other specified severity Disruption of external operation (surgical) wound, not elsewhere classified, initial encounter Type 2 diabetes mellitus with other skin ulcer Procedures Wound #2 Pre-procedure diagnosis of Wound #2 is a Cellulitis located on the Right,Distal,Medial Lower Leg . There was a Selective/Open Wound Non-Viable Tissue Debridement with a total area of 7.35 sq cm performed by Ricard Dillon, MD. With the following instrument(s): Curette to remove Non-Viable tissue/material. Material removed includes Eschar and Callus and after achieving pain control using Lidocaine. No specimens were taken. A time out was conducted at 08:44, prior to the start of the procedure. There was no bleeding. The procedure was tolerated well with a pain level of 0 throughout and a pain level of 0 following the procedure. Post Debridement Measurements: 3.5cm length x 2.1cm width x 0.2cm depth; 1.155cm^3 volume. Character of Wound/Ulcer Post Debridement is improved. Post procedure Diagnosis Wound #2: Same as Pre-Procedure Wound #3 Pre-procedure  diagnosis of Wound #3 is an Open Surgical Wound located on the Right,Medial Lower Leg . There was a Selective/Open Wound Non-Viable Tissue Debridement with a total area of 4 sq cm performed by Ricard Dillon, MD. With the following instrument(s): Curette to remove Non-Viable tissue/material. Material removed includes Eschar and Callus and after achieving pain control using Lidocaine. No specimens were taken. A time out was conducted  at 08:44, prior to the start of the procedure. There was no bleeding. The procedure was tolerated well with a pain level of 0 throughout and a pain level of 0 following the procedure. Post Debridement Measurements: 5cm length x 0.8cm width x 0.1cm depth; 0.314cm^3 volume. Character of Wound/Ulcer Post Debridement is improved. Kenneth Cline, Kenneth Cline ([id number]) Post procedure Diagnosis Wound #3: Same as Pre-Procedure Plan Wound Cleansing: Wound #1 Right,Proximal,Medial Lower Leg: Cleanse wound with mild soap and water May Shower, gently pat wound dry prior to applying new dressing. Wound #2 Right,Distal,Medial Lower Leg: Cleanse wound with mild soap and water May Shower, gently pat wound dry prior to applying new dressing. Wound #3 Right,Medial Lower Leg: Cleanse wound with mild soap and water May Shower, gently pat wound dry prior to applying new dressing. Anesthetic (add to Medication List): Wound #1 Right,Proximal,Medial Lower Leg: Topical Lidocaine 4% cream applied to wound bed prior to debridement (In Clinic Only). Wound #2 Right,Distal,Medial Lower Leg: Topical Lidocaine 4% cream applied to wound bed prior to debridement (In Clinic Only). Wound #3 Right,Medial Lower Leg: Topical Lidocaine 4% cream applied to wound bed prior to debridement (In Clinic Only). Primary Wound Dressing: Wound #1 Right,Proximal,Medial Lower Leg: Hydrafera Blue Ready Transfer Wound #2 Right,Distal,Medial Lower Leg: Hydrafera Blue Ready Transfer Wound #3 Right,Medial Lower  Leg: Hydrafera Blue Ready Transfer Secondary Dressing: Wound #1 Right,Proximal,Medial Lower Leg: ABD pad Wound #2 Right,Distal,Medial Lower Leg: ABD pad Wound #3 Right,Medial Lower Leg: ABD pad Dressing Change Frequency: Wound #1 Right,Proximal,Medial Lower Leg: Change Dressing Monday, Wednesday, Friday Wound #2 Right,Distal,Medial Lower Leg: Change Dressing Monday, Wednesday, Friday Wound #3 Right,Medial Lower Leg: Change Dressing Monday, Wednesday, Friday Follow-up Appointments: Wound #1 Right,Proximal,Medial Lower Leg: Return Appointment in 1 week. Wound #2 Right,Distal,Medial Lower Leg: Return Appointment in 1 week. Wound #3 Right,Medial Lower Leg: Return Appointment in 1 week. Edema Control: Wound #1 Right,Proximal,Medial Lower Leg: 3 Layer Compression System - Right Lower Extremity - May use 2 layer at home Elevate legs to the level of the heart and pump ankles as often as possible Wound #2 Right,Distal,Medial Lower Leg: Kenneth Cline, Kenneth Cline ([id number]) 3 Layer Compression System - Right Lower Extremity - May use 2 layer at home Elevate legs to the level of the heart and pump ankles as often as possible Wound #3 Right,Medial Lower Leg: 3 Layer Compression System - Right Lower Extremity - May use 2 layer at home Elevate legs to the level of the heart and pump ankles as often as possible Home Health: Wound #1 Right,Proximal,Medial Lower Leg: Continue Home Health Visits - [place] Nurse may visit PRN to address patient s wound care needs. FACE TO FACE ENCOUNTER: MEDICARE and MEDICAID PATIENTS: I certify that this patient is under my care and that I had a face-to-face encounter that meets the physician face-to-face encounter requirements with this patient on this date. The encounter with the patient was in whole or in part for the following MEDICAL CONDITION: (primary reason for [place]) MEDICAL NECESSITY: I certify, that based on my findings, NURSING  services are a medically necessary home health service. HOME BOUND STATUS: I certify that my clinical findings support that this patient is homebound (i.e., Due to illness or injury, pt requires aid of supportive devices such as crutches, cane, wheelchairs, walkers, the use of special transportation or the assistance of another person to leave their place of residence. There is a normal inability to leave the home and doing so requires considerable and taxing effort.  Other absences are for medical reasons / religious services and are infrequent or of short duration when for other reasons). - If not using the the Ready, Please moisten with saline. If current dressing causes regression in wound condition, may D/C ordered dressing product/s and apply Normal Saline Moist Dressing daily until next [place] / Other MD appointment. [place] of regression in wound condition at [phone]. Please direct any NON-WOUND related issues/requests for orders to patient's Primary Care Physician Wound #2 Right,Distal,Medial Lower Leg: [place] Nurse may visit PRN to address patient s wound care needs. FACE TO FACE ENCOUNTER: MEDICARE and MEDICAID PATIENTS: I certify that this patient is under my care and that I had a face-to-face encounter that meets the physician face-to-face encounter requirements with this patient on this date. The encounter with the patient was in whole or in part for the following MEDICAL CONDITION: (primary reason for [place]) MEDICAL NECESSITY: I certify, that based on my findings, NURSING services are a medically necessary home health service. HOME BOUND STATUS: I certify that my clinical findings support that this patient is homebound (i.e., Due to illness or injury, pt requires aid of supportive devices such as crutches, cane, wheelchairs, walkers, the use of special transportation or the assistance of  another person to leave their place of residence. There is a normal inability to leave the home and doing so requires considerable and taxing effort. Other absences are for medical reasons / religious services and are infrequent or of short duration when for other reasons). If current dressing causes regression in wound condition, may D/C ordered dressing product/s and apply Normal Saline Moist Dressing daily until next [place] / Other MD appointment. [place] of regression in wound condition at [phone]. Please direct any NON-WOUND related issues/requests for orders to patient's Primary Care Physician Wound #3 Right,Medial Lower Leg: [place] Nurse may visit PRN to address patient s wound care needs. FACE TO FACE ENCOUNTER: MEDICARE and MEDICAID PATIENTS: I certify that this patient is under my care and that I had a face-to-face encounter that meets the physician face-to-face encounter requirements with this patient on this date. The encounter with the patient was in whole or in part for the following MEDICAL CONDITION: (primary reason for [place]) MEDICAL NECESSITY: I certify, that based on my findings, NURSING services are a medically necessary home health service. HOME BOUND STATUS: I certify that my clinical findings support that this patient is homebound (i.e., Due to illness or injury, pt requires aid of supportive devices such as crutches, cane, wheelchairs, walkers, the use of special transportation or the assistance of another person to leave their place of residence. There is a normal inability to leave the home and doing so requires considerable and taxing effort. Other absences are for medical reasons / religious services and are infrequent or of short duration when for other reasons). If current dressing causes regression in wound condition, may D/C ordered dressing product/s and apply Normal  Saline Moist Dressing daily until next [place] / Other MD appointment. [place] M[place] of regression in wound condition at [phone]. Please direct any NON-WOUND related issues/requests for orders to patient's Primary Care Physician 1. Continue with the [facility] classic which of course needs to be MOISTENED 2. I see no reason to consider antibiotics at this point in spite of the culture of Pseudomonas that was  done in [place], [place]. ([id number]) wound care center 3. Still 3 layer compression which she is tolerating well he has home health changing the dressing Electronic Signature(s) Signed: 12/19/2018 3:49:11 PM By: Gretta Cool, BSN, RN, CWS, Kim RN, BSN Signed: 12/20/2018 5:26:07 PM By: Linton Ham MD Previous Signature: 12/18/2018 4:56:01 PM Version By: Linton Ham MD Entered By: Gretta Cool, BSN, RN, CWS, Kim on 12/19/2018 15:49:10 [place], [place] Kenneth Cline ([id number]) -------------------------------------------------------------------------------- [place] Details Patient Name: Kenneth Cline Date of Service: 12/18/2018 Medical Record Number: [id number] Patient Account Number: [id number] Date of Birth/Sex: [birth year] (63 y.o. 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 T81.31XA Disruption of external operation (surgical) wound, not elsewhere classified, initial encounter E11.622 Type 2 diabetes mellitus with other skin ulcer Facility Procedures CPT4 Code Description: [id number] - DEBRIDE WOUND 1ST 20 SQ CM OR < ICD-10 Diagnosis Description L97.818 Non-pressure chronic ulcer of other part of right lower leg with o Modifier: ther specified s Quantity: 1  [place] Physician Procedures CPT4 Code Description: [id number] - WC PHYS DEBR WO ANESTH 20 SQ CM ICD-10 Diagnosis Description L97.818 Non-pressure chronic ulcer of other part of right lower leg with o Modifier: ther specified s Quantity: 1 everity Electronic Signature(s) Signed: 12/18/2018 4:56:01 PM By: Linton Ham MD Entered By: Linton Ham on 12/18/2018 08:52:37

## 2018-12-18 NOTE — Progress Notes (Signed)
[name], [name] ([id number]) Visit Report for 12/18/2018 Arrival Information Details Patient Name: Kenneth Cline, Kenneth Cline Date of Service: 12/18/2018 8:30 AM Medical Record Number: [id number] Patient Account Number: [id number] Date of Birth/Sex: October 11, 1955 (63 y.o. M) Treating RN: Army Melia Primary Care Tiannah Greenly: Jenna Luo Other Clinician: Referring Emmie Frakes: Jenna Luo Treating Haile Bosler/Extender: Tito Dine in Treatment: 1 Visit Information History Since Last Visit Added or deleted any medications: No Patient Arrived: Ambulatory Any new allergies or adverse reactions: No Arrival Time: 08:28 Had a fall or experienced change in No Accompanied By: self activities of daily living that may affect Transfer Assistance: None risk of falls: Patient Has Alerts: Yes Signs or symptoms of abuse/neglect since last visito No Patient Alerts: Patient on Blood Thinner Hospitalized since last visit: No Warfarin Has Dressing in Place as Prescribed: Yes ABI L 1.2 R 1.27 Pain Present Now: No Electronic Signature(s) Signed: 12/18/2018 11:16:42 AM By: Army Melia Entered By: Army Melia on 12/18/2018 08:28:17 Kenneth Cline ([id number]) -------------------------------------------------------------------------------- Encounter Discharge Information Details Patient Name: Kenneth Cline Date of Service: 12/18/2018 8:30 AM Medical Record Number: [id number] Patient Account Number: [id number] Date of Birth/Sex: 04/19/1956 (63 y.o. M) Treating RN: Montey Hora Primary Care Liston Thum: Jenna Luo Other Clinician: Referring Ahnna Dungan: Jenna Luo Treating Harlean Regula/Extender: Tito Dine in Treatment: 1 Encounter Discharge Information Items Post Procedure Vitals Discharge Condition: Stable Temperature (F): 98.9 Ambulatory Status: Ambulatory Pulse (bpm): 96 Discharge Destination: Home Respiratory Rate (breaths/min): 16 Transportation: Private Auto Blood Pressure  (mmHg): 132/93 Accompanied By: self Schedule Follow-up Appointment: Yes Clinical Summary of Care: Electronic Signature(s) Signed: 12/18/2018 10:54:42 AM By: Montey Hora Entered By: Montey Hora on 12/18/2018 10:54:42 Kenneth Cline ([id number]) -------------------------------------------------------------------------------- Lower Extremity Assessment Details Patient Name: Kenneth Cline Date of Service: 12/18/2018 8:30 AM Medical Record Number: [id number] Patient Account Number: [id number] Date of Birth/Sex: Jun 25, 1955 (63 y.o. M) Treating RN: Army Melia Primary Care Shann Merrick: Jenna Luo Other Clinician: Referring Latonia Conrow: Jenna Luo Treating Leonidus Rowand/Extender: Tito Dine in Treatment: 1 Edema Assessment Assessed: [Left: No] [Right: No] Edema: [Left: N] [Right: o] Calf Left: Right: Point of Measurement: 31 cm From Medial Instep cm 35 cm Ankle Left: Right: Point of Measurement: 11 cm From Medial Instep cm 22 cm Vascular Assessment Pulses: Dorsalis Pedis Palpable: [Right:Yes] Electronic Signature(s) Signed: 12/18/2018 11:16:42 AM By: Army Melia Entered By: Army Melia on 12/18/2018 08:39:12 Cline, Kenneth Lerner ([id number]) -------------------------------------------------------------------------------- Multi Wound Chart Details Patient Name: Kenneth Cline Date of Service: 12/18/2018 8:30 AM Medical Record Number: [id number] Patient Account Number: [id number] Date of Birth/Sex: 13-Aug-1955 (63 y.o. M) Treating RN: Harold Barban Primary Care Brenae Lasecki: Jenna Luo Other Clinician: Referring Kinston Magnan: Jenna Luo Treating Leevon Upperman/Extender: Tito Dine in Treatment: 1 Vital Signs Height(in): 69 Pulse(bpm): 96 Weight(lbs): 183 Blood Pressure(mmHg): 132/93 Body Mass Index(BMI): 27 Temperature(F): 98.9 Respiratory Rate 16 (breaths/min): Photos: Wound Location: Right Lower Leg - Medial, Right Lower Leg - Medial, Right  Lower Leg - Medial Proximal Distal Wounding Event: Gradually Appeared Gradually Appeared Surgical Injury Primary Etiology: Cellulitis Cellulitis Open Surgical Wound Comorbid History: Type II Diabetes Type II Diabetes Type II Diabetes Date Acquired: 10/07/2018 10/07/2018 10/12/2018 Weeks of Treatment: 1 1 1  Wound Status: Open Open Open Measurements L x W x D 0.9x0.6x0.2 3.5x2.1x0.2 5x0.8x0.1 (cm) Area (cm) : 0.424 5.773 3.142 Volume (cm) : 0.085 1.155 0.314 % Reduction in Area: 40.00% 33.20% 27.30% % Reduction in Volume: 59.90% 66.60% 27.30% Classification: Full Thickness Without Full Thickness Without Full Thickness Without Exposed Support  Structures Exposed Support Structures Exposed Support Structures Exudate Amount: Medium Medium Medium Exudate Type: Serous Serous Serous Exudate Color: amber amber amber Wound Margin: Flat and Intact Flat and Intact Flat and Intact Granulation Amount: Medium (34-66%) Large (67-100%) Large (67-100%) Granulation Quality: Pink Red Red Necrotic Amount: Medium (34-66%) Small (1-33%) Small (1-33%) Necrotic Tissue: Eschar, Adherent Mamou Exposed Structures: Fat Layer (Subcutaneous Fat Layer (Subcutaneous Fat Layer (Subcutaneous Tissue) Exposed: Yes Tissue) Exposed: Yes Tissue) Exposed: Yes Fascia: No Fascia: No Fascia: No Tendon: No Tendon: No Tendon: No Muscle: No Muscle: No Muscle: No Cline, Kenneth L. ([id number]) Joint: No Joint: No Joint: No Bone: No Bone: No Bone: No Epithelialization: None None Small (1-33%) Debridement: [name] Debridement - Selective/Open Debridement - Selective/Open Wound Wound Pre-procedure [name] 08:44 08:44 Verification/Time Out Taken: Pain Control: [name] Lidocaine Lidocaine Tissue Debrided: [name] Necrotic/Eschar, Callus Necrotic/Eschar, Callus Level: [name] Non-Viable Tissue Non-Viable Tissue Debridement Area (sq cm): [name] 7.35 4 Instrument: [name] Curette Curette Bleeding: [name] None  None Procedural Pain: [name] 0 0 Post Procedural Pain: [name] 0 0 Debridement Treatment [name] Procedure was tolerated well Procedure was tolerated well Response: Post Debridement [name] 3.5x2.1x0.2 5x0.8x0.1 Measurements L x W x D (cm) Post Debridement Volume: [name] 1.155 0.314 (cm) Procedures Performed: [name] Debridement Debridement Treatment Notes Electronic Signature(s) Signed: 12/18/2018 4:56:01 PM By: Linton Ham MD Entered By: Linton Ham on 12/18/2018 08:47:34 Robar, Kenneth Lerner ([id number]) -------------------------------------------------------------------------------- Multi-Disciplinary Care Plan Details Patient Name: Kenneth Cline Date of Service: 12/18/2018 8:30 AM Medical Record Number: [id number] Patient Account Number: [id number] Date of Birth/Sex: 12-12-1955 (63 y.o. M) Treating RN: Harold Barban Primary Care Tena Linebaugh: Jenna Luo Other Clinician: Referring Dameer Speiser: Jenna Luo Treating Sammantha Mehlhaff/Extender: Tito Dine in Treatment: 1 Active Inactive Medication Nursing Diagnoses: Knowledge deficit related to medication safety: actual or potential Goals: Patient/caregiver will demonstrate understanding of all current medications Date Initiated: 12/11/2018 Target Resolution Date: 12/11/2018 Goal Status: Active Interventions: Assess for medication contraindications each visit where new medications are prescribed Treatment Activities: New medication prescribed at [facility] : 12/11/2018 Notes: Necrotic Tissue Nursing Diagnoses: Impaired tissue integrity related to necrotic/devitalized tissue Goals: Necrotic/devitalized tissue will be minimized in the wound bed Date Initiated: 12/11/2018 Target Resolution Date: 12/10/2018 Goal Status: Active Interventions: Assess patient pain level pre-, during and post procedure and prior to discharge Treatment Activities: Excisional debridement : 12/11/2018 Notes: Soft Tissue Infection Nursing  Diagnoses: Impaired tissue integrity Potential for infection: soft tissue Bamber, Averill L. ([id number]) Goals: Patient will remain free of wound infection Date Initiated: 12/11/2018 Target Resolution Date: 12/18/2018 Goal Status: Active Interventions: Assess signs and symptoms of infection every visit Notes: Wound/Skin Impairment Nursing Diagnoses: Impaired tissue integrity Goals: Patient/caregiver will verbalize understanding of skin care regimen Date Initiated: 12/11/2018 Target Resolution Date: 12/18/2018 Goal Status: Active Ulcer/skin breakdown will have a volume reduction of 30% by week 4 Date Initiated: 12/11/2018 Target Resolution Date: 01/11/2019 Goal Status: Active Interventions: Assess ulceration(s) every visit Treatment Activities: Referred to DME Tajuanna Burnett for dressing supplies : 12/11/2018 Skin care regimen initiated : 12/11/2018 Notes: Electronic Signature(s) Signed: 12/18/2018 4:23:12 PM By: Harold Barban Entered By: Harold Barban on 12/18/2018 08:44:22 Maclaughlin, Kenneth Lerner ([id number]) -------------------------------------------------------------------------------- Pain Assessment Details Patient Name: Kenneth Cline Date of Service: 12/18/2018 8:30 AM Medical Record Number: [id number] Patient Account Number: [id number] Date of Birth/Sex: [birth year] (63 y.o. M) Treating RN: Army Melia Primary Care Neeta Storey: Jenna Luo Other Clinician: Referring Janean Eischen: Jenna Luo Treating Anupama Piehl/Extender: Tito Dine in Treatment: 1 Active Problems Location  of Pain Severity and Description of Pain Patient Has Paino No Site Locations Pain Management and Medication Current Pain Management: Electronic Signature(s) Signed: 12/18/2018 11:16:42 AM By: Army Melia Entered By: Army Melia on 12/18/2018 [id number] Kenneth Cline ([id number]) -------------------------------------------------------------------------------- Patient/Caregiver Education  Details Patient Name: Kenneth Cline Date of Service: 12/18/2018 8:30 AM Medical Record Number: [id number] Patient Account Number: [id number] Date of Birth/Gender: Jun 24, 1955 (63 y.o. M) Treating RN: Harold Barban Primary Care Physician: Jenna Luo Other Clinician: Referring Physician: Jenna Luo Treating Physician/Extender: Tito Dine in Treatment: 1 Education Assessment Education Provided To: Patient Education Topics Provided Wound/Skin Impairment: Handouts: Caring for Your Ulcer Methods: Demonstration, Explain/Verbal Responses: State content correctly Electronic Signature(s) Signed: 12/18/2018 4:23:12 PM By: Harold Barban Entered By: Harold Barban on 12/18/2018 08:48:21 Mcbryar, Kenneth Lerner ([id number]) -------------------------------------------------------------------------------- Wound Assessment Details Patient Name: Kenneth Cline Date of Service: 12/18/2018 8:30 AM Medical Record Number: [id number] Patient Account Number: [id number] Date of Birth/Sex: [birth year] (63 y.o. M) Treating RN: Army Melia Primary Care Azaliyah Kennard: Jenna Luo Other Clinician: Referring Geniene List: Jenna Luo Treating Kinsler Soeder/Extender: Tito Dine in Treatment: 1 Wound Status Wound Number: 1 Primary Etiology: Cellulitis Wound Location: Right Lower Leg - Medial, Proximal Wound Status: Open Wounding Event: Gradually Appeared Comorbid History: Type II Diabetes Date Acquired: 10/07/2018 Weeks Of Treatment: 1 Clustered Wound: No Photos Wound Measurements Length: (cm) 0.9 Width: (cm) 0.6 Depth: (cm) 0.2 Area: (cm) 0.424 Volume: (cm) 0.085 % Reduction in Area: 40% % Reduction in Volume: 59.9% Epithelialization: None Tunneling: No Undermining: No Wound Description Full Thickness Without Exposed Support Classification: Structures Wound Margin: Flat and Intact Exudate Medium Amount: Exudate Type: Serous Exudate Color: amber Foul Odor After  Cleansing: No Slough/Fibrino No Wound Bed Granulation Amount: Medium (34-66%) Exposed Structure Granulation Quality: Pink Fascia Exposed: No Necrotic Amount: Medium (34-66%) Fat Layer (Subcutaneous Tissue) Exposed: Yes Necrotic Quality: Eschar, Adherent Slough Tendon Exposed: No Muscle Exposed: No Joint Exposed: No Bone Exposed: No Maclay, Ahmon L. ([id number]) Treatment Notes Wound #1 (Right, Proximal, Medial Lower Leg) Notes Hydrofera Blue, ABD, 3 layer, unna paste. Electronic Signature(s) Signed: 12/18/2018 11:16:42 AM By: Army Melia Entered By: Army Melia on 12/18/2018 08:36:46 Perfecto, Kenneth Lerner ([id number]) -------------------------------------------------------------------------------- Wound Assessment Details Patient Name: Kenneth Cline Date of Service: 12/18/2018 8:30 AM Medical Record Number: [id number] Patient Account Number: [id number] Date of Birth/Sex: October 03, 1955 (63 y.o. M) Treating RN: Army Melia Primary Care Va Broadwell: Jenna Luo Other Clinician: Referring Jamarea Selner: Jenna Luo Treating Traevon Meiring/Extender: Tito Dine in Treatment: 1 Wound Status Wound Number: 2 Primary Etiology: Cellulitis Wound Location: Right Lower Leg - Medial, Distal Wound Status: Open Wounding Event: Gradually Appeared Comorbid History: Type II Diabetes Date Acquired: 10/07/2018 Weeks Of Treatment: 1 Clustered Wound: No Photos Wound Measurements Length: (cm) 3.5 Width: (cm) 2.1 Depth: (cm) 0.2 Area: (cm) 5.773 Volume: (cm) 1.155 % Reduction in Area: 33.2% % Reduction in Volume: 66.6% Epithelialization: None Tunneling: No Undermining: No Wound Description Full Thickness Without Exposed Support Classification: Structures Wound Margin: Flat and Intact Exudate Medium Amount: Exudate Type: Serous Exudate Color: amber Foul Odor After Cleansing: No Slough/Fibrino Yes Wound Bed Granulation Amount: Large (67-100%) Exposed Structure Granulation  Quality: Red Fascia Exposed: No Necrotic Amount: Small (1-33%) Fat Layer (Subcutaneous Tissue) Exposed: Yes Necrotic Quality: Adherent Slough Tendon Exposed: No Muscle Exposed: No Joint Exposed: No Bone Exposed: No Haigler, Arlene L. ([id number]) Treatment Notes Wound #2 (Right, Distal, Medial Lower Leg) Notes Hydrofera Blue, ABD, 3 layer, unna paste. Electronic Signature(s) Signed: 12/18/2018 11:16:42  AM By: Army Melia Entered By: Army Melia on 12/18/2018 08:37:12 Kenneth Cline ([id number]) -------------------------------------------------------------------------------- Wound Assessment Details Patient Name: Kenneth Cline Date of Service: 12/18/2018 8:30 AM Medical Record Number: [id number] Patient Account Number: [id number] Date of Birth/Sex: [birth year] (63 y.o. M) Treating RN: Army Melia Primary Care Elnoria Livingston: Jenna Luo Other Clinician: Referring Wrangler Penning: Jenna Luo Treating Rozlynn Lippold/Extender: Tito Dine in Treatment: 1 Wound Status Wound Number: 3 Primary Etiology: Open Surgical Wound Wound Location: Right Lower Leg - Medial Wound Status: Open Wounding Event: Surgical Injury Comorbid History: Type II Diabetes Date Acquired: 10/12/2018 Weeks Of Treatment: 1 Clustered Wound: No Photos Wound Measurements Length: (cm) 5 Width: (cm) 0.8 Depth: (cm) 0.1 Area: (cm) 3.142 Volume: (cm) 0.314 % Reduction in Area: 27.3% % Reduction in Volume: 27.3% Epithelialization: Small (1-33%) Tunneling: No Undermining: No Wound Description Full Thickness Without Exposed Support Classification: Structures Wound Margin: Flat and Intact Exudate Medium Amount: Exudate Type: Serous Exudate Color: amber Foul Odor After Cleansing: No Slough/Fibrino Yes Wound Bed Granulation Amount: Large (67-100%) Exposed Structure Granulation Quality: Red Fascia Exposed: No Necrotic Amount: Small (1-33%) Fat Layer (Subcutaneous Tissue) Exposed: Yes Necrotic  Quality: Adherent Slough Tendon Exposed: No Muscle Exposed: No Joint Exposed: No Bone Exposed: No Bringle, Markell L. ([id number]) Treatment Notes Wound #3 (Right, Medial Lower Leg) Notes Hydrofera Blue, ABD, 3 layer, unna paste. Electronic Signature(s) Signed: 12/18/2018 11:16:42 AM By: Army Melia Entered By: Army Melia on 12/18/2018 08:38:19 Kenneth Cline ([id number]) -------------------------------------------------------------------------------- Vitals Details Patient Name: Kenneth Cline Date of Service: 12/18/2018 8:30 AM Medical Record Number: [id number] Patient Account Number: [id number] Date of Birth/Sex: [birth year] (63 y.o. M) Treating RN: Army Melia Primary Care Laniqua Torrens: Jenna Luo Other Clinician: Referring Nansi Birmingham: Jenna Luo Treating Reyann Troop/Extender: Tito Dine in Treatment: 1 Vital Signs Time Taken: 08:28 Temperature (F): 98.9 Height (in): 69 Pulse (bpm): 96 Weight (lbs): 183 Respiratory Rate (breaths/min): 16 Body Mass Index (BMI): 27 Blood Pressure (mmHg): 132/93 Reference Range: 80 - 120 mg / dl Electronic Signature(s) Signed: 12/18/2018 11:16:42 AM By: Army Melia Entered By: Army Melia on 12/18/2018 08:28:59

## 2018-12-20 DIAGNOSIS — E1165 Type 2 diabetes mellitus with hyperglycemia: Secondary | ICD-10-CM | POA: Diagnosis not present

## 2018-12-20 DIAGNOSIS — E039 Hypothyroidism, unspecified: Secondary | ICD-10-CM | POA: Diagnosis not present

## 2018-12-20 DIAGNOSIS — K8301 Primary sclerosing cholangitis: Secondary | ICD-10-CM | POA: Diagnosis not present

## 2018-12-20 DIAGNOSIS — B9561 Methicillin susceptible Staphylococcus aureus infection as the cause of diseases classified elsewhere: Secondary | ICD-10-CM | POA: Diagnosis not present

## 2018-12-20 DIAGNOSIS — L03115 Cellulitis of right lower limb: Secondary | ICD-10-CM | POA: Diagnosis not present

## 2018-12-20 DIAGNOSIS — E272 Addisonian crisis: Secondary | ICD-10-CM | POA: Diagnosis not present

## 2018-12-20 DIAGNOSIS — Z48817 Encounter for surgical aftercare following surgery on the skin and subcutaneous tissue: Secondary | ICD-10-CM | POA: Diagnosis not present

## 2018-12-20 DIAGNOSIS — L511 Stevens-Johnson syndrome: Secondary | ICD-10-CM | POA: Diagnosis not present

## 2018-12-20 DIAGNOSIS — D6862 Lupus anticoagulant syndrome: Secondary | ICD-10-CM | POA: Diagnosis not present

## 2018-12-20 DIAGNOSIS — N280 Ischemia and infarction of kidney: Secondary | ICD-10-CM | POA: Diagnosis not present

## 2018-12-23 ENCOUNTER — Ambulatory Visit (INDEPENDENT_AMBULATORY_CARE_PROVIDER_SITE_OTHER): Payer: Medicare HMO | Admitting: Cardiovascular Disease

## 2018-12-23 DIAGNOSIS — Z7901 Long term (current) use of anticoagulants: Secondary | ICD-10-CM | POA: Diagnosis not present

## 2018-12-23 DIAGNOSIS — I823 Embolism and thrombosis of renal vein: Secondary | ICD-10-CM

## 2018-12-23 LAB — POCT INR: INR: 2.5 (ref 2.0–3.0)

## 2018-12-25 ENCOUNTER — Other Ambulatory Visit: Payer: Self-pay

## 2018-12-25 ENCOUNTER — Encounter: Payer: Medicare HMO | Attending: Internal Medicine | Admitting: Internal Medicine

## 2018-12-25 DIAGNOSIS — L97819 Non-pressure chronic ulcer of other part of right lower leg with unspecified severity: Secondary | ICD-10-CM | POA: Insufficient documentation

## 2018-12-25 DIAGNOSIS — D6862 Lupus anticoagulant syndrome: Secondary | ICD-10-CM | POA: Insufficient documentation

## 2018-12-25 DIAGNOSIS — E039 Hypothyroidism, unspecified: Secondary | ICD-10-CM | POA: Insufficient documentation

## 2018-12-25 DIAGNOSIS — T8141XA Infection following a procedure, superficial incisional surgical site, initial encounter: Secondary | ICD-10-CM | POA: Diagnosis not present

## 2018-12-25 DIAGNOSIS — E11622 Type 2 diabetes mellitus with other skin ulcer: Secondary | ICD-10-CM | POA: Diagnosis not present

## 2018-12-25 DIAGNOSIS — Z7901 Long term (current) use of anticoagulants: Secondary | ICD-10-CM | POA: Diagnosis not present

## 2018-12-25 DIAGNOSIS — Z86718 Personal history of other venous thrombosis and embolism: Secondary | ICD-10-CM | POA: Diagnosis not present

## 2018-12-25 DIAGNOSIS — E2740 Unspecified adrenocortical insufficiency: Secondary | ICD-10-CM | POA: Insufficient documentation

## 2018-12-25 DIAGNOSIS — R69 Illness, unspecified: Secondary | ICD-10-CM | POA: Diagnosis not present

## 2018-12-25 DIAGNOSIS — L03115 Cellulitis of right lower limb: Secondary | ICD-10-CM | POA: Insufficient documentation

## 2018-12-25 DIAGNOSIS — I10 Essential (primary) hypertension: Secondary | ICD-10-CM | POA: Insufficient documentation

## 2018-12-27 NOTE — Progress Notes (Signed)
Kenneth Cline ([id number]) Visit Report for 12/25/2018 HPI Details Patient Name: Kenneth Cline, Kenneth Cline Date of Service: 12/25/2018 8:30 AM Medical Record Number: [id number] Patient Account Number: [id number] Date of Birth/Sex: 1955-11-23 (63 y.o. M) Treating RN: Cornell Barman Primary Care Provider: Jenna Luo Other Clinician: Referring Provider: Jenna Luo Treating Provider/Extender: Tito Dine in Treatment: 2 History of Present Illness HPI Description: ADMISSION 12/11/2018 This is a 63 year old man who is a type II diabetic. He apparently developed a "boil" on his right lower leg sometime in mid June. He subsequently went to the beach and was in the ocean. He was admitted to [facility] from 6/20 through 6/30 with septic shock felt to be secondary from cellulitis in the right lower leg. There was concern for compartment syndrome and on 6/20 he underwent a right leg fasciotomy by Dr. Ardeth Sportsman of surgery. He received different courses of antibiotics. As far as I am able to determine the wound had an MSSA infection based on follow-up notes from general surgery but I cannot see the actual culture result. In any case he was discharged on doxycycline. He also had a wound VAC on this for a period of time. He saw general surgery in follow-up on 11/14/2018 and I am able to see this note in [facility] link. He had an "right lower extremity MSSA infection". At that point his wounds were 2 measuring 3 x 4 x 5x2 wound VAC was discontinued at that time and he was put on a wet to dry dressing. He also was given clindamycin for the recurrent wound infection. He was scheduled actually to go to the OR for further debridement although I cannot see that this was actually done. He tells me he went to the wound care center at [facility] on 3 occasions. By the time he was seen at the wound care center on 8 5 there were 3 wounds all requiring debridement. They were dressed with Iodosorb and  poly-man. On 8/12 a wound culture was done that showed light Pseudomonas and a few Staphylococcus although he was not started on antibiotics out of concern for a drug interaction with Coumadin. He actually lives in [place] so he is come to our clinic today for follow-up. 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 His ABIs in our clinic were 1.27 on the right and 1.2 on the left. 8/26; apparently home health was not moistening the Hydrofera Blue classic so it stuck to the wound nevertheless the major wound is down in size. There are 3 open areas the original surgical site and then just laterally to this a more proximal small wound and more distally a larger wound. I did not put him on antibiotics last week has nothing look infected. Still nothing looks infected today 9/2; we have been using Hydrofera Blue. He wants to dismiss home health. We have him in 3 layer compression. His wife apparently is able to do Curlex Coban and has this at home. He is going to [employer] next week and wants a 2-week follow- up. The original surgical wound I think is healed although it has some surface eschar on it. The small superior wound is closed and his major wound is smaller Electronic Signature(s) Signed: 12/25/2018 5:28:46 PM By: Linton Ham MD Entered By: Linton Ham on 12/25/2018 09:24:48  Kenneth Cline ([id number]) -------------------------------------------------------------------------------- Physical Exam Details Patient Name: Kenneth Cline, Kenneth Cline Date of Service: 12/25/2018 8:30 AM Medical Record Number: [id number] Patient Account Number: [id number] Date of Birth/Sex: 1956-03-15 (63 y.o. M) Treating RN: Cornell Barman Primary Care Provider: Jenna Luo Other Clinician: Referring Provider: Jenna Luo Treating Provider/Extender: Tito Dine in Treatment: 2 Constitutional Sitting or standing Blood Pressure is within target range for patient.. Pulse regular and within target range for patient.Marland Kitchen Respirations regular, non-labored and within target range.. Temperature is normal and within the target range for the patient.Marland Kitchen appears in no distress. Eyes Conjunctivae clear. No discharge. Respiratory Respiratory effort is easy and symmetric bilaterally. Rate is normal at rest and on room air.. Cardiovascular Pedal pulses palpable. Edema is well controlled. Integumentary (Hair, Skin) No erythema around the wound. Psychiatric No evidence of depression, anxiety, or agitation. Calm, cooperative, and communicative. Appropriate interactions and affect.. Notes Wound exam; the patient has 2 areas that are healed the original surgical incision and the small wound superiorly. The major wound is inferiorly and that is smaller. Some eschar around the surface removed with gauze. Surface of the wound looks healthy. Dimensions are better. Electronic Signature(s) Signed: 12/25/2018 5:28:46 PM By: Linton Ham MD Entered By: Linton Ham on 12/25/2018 09:26:53 Kenneth Cline ([id number]) -------------------------------------------------------------------------------- Physician Orders Details Patient Name: Kenneth Cline Date of Service: 12/25/2018 8:30 AM Medical Record Number: [id number] Patient Account Number: [id number] Date of Birth/Sex: [birth year] (63 y.o. M) Treating RN: Cornell Barman Primary Care Provider: Jenna Luo Other Clinician: Referring Provider: Jenna Luo Treating Provider/Extender: Tito Dine in Treatment: 2 Verbal / Phone Orders: No Diagnosis Coding Wound Cleansing Wound #1 Right,Proximal,Medial Lower Leg o Cleanse wound with mild soap and water o May Shower, gently pat wound dry  prior to applying new dressing. Wound #2 Right,Distal,Medial Lower Leg o Cleanse wound with mild soap and water o May Shower, gently pat wound dry prior to applying new dressing. Wound #3 Right,Medial Lower Leg o Cleanse wound with mild soap and water o May Shower, gently pat wound dry prior to applying new dressing. Anesthetic (add to Medication List) Wound #1 Right,Proximal,Medial Lower Leg o Topical Lidocaine 4% cream applied to wound bed prior to debridement (In Clinic Only). Wound #2 Right,Distal,Medial Lower Leg o Topical Lidocaine 4% cream applied to wound bed prior to debridement (In Clinic Only). Wound #3 Right,Medial Lower Leg o Topical Lidocaine 4% cream applied to wound bed prior to debridement (In Clinic Only). Primary Wound Dressing Wound #1 Right,Proximal,Medial Lower Leg o Hydrafera Blue Ready Transfer Wound #2 Right,Distal,Medial Lower Leg o Hydrafera Blue Ready Transfer Wound #3 Right,Medial Lower Leg o Hydrafera Blue Ready Transfer Secondary Dressing Wound #1 Right,Proximal,Medial Lower Leg o ABD pad Wound #2 Right,Distal,Medial Lower Leg o ABD pad Wound #3 Right,Medial Lower Leg o ABD pad Dressing Change Frequency Kenneth Cline, VANDECAR. ([id number]) Wound #1 Right,Proximal,Medial Lower Leg o Change Dressing Monday, Wednesday, Friday Wound #2 Right,Distal,Medial Lower Leg o Change Dressing Monday, Wednesday, Friday Wound #3 Right,Medial Lower Leg o Change Dressing Monday, Wednesday, Friday Follow-up Appointments Wound #1 Right,Proximal,Medial Lower Leg o Return Appointment in 2 weeks. - Wife will change at one week. Wound #2 Right,Distal,Medial Lower Leg o Return Appointment in 2 weeks. - Wife will change at one week. Wound #3 Right,Medial Lower Leg o Return Appointment in 2 weeks. - Wife will change at one week. Edema Control Wound #1 Right,Proximal,Medial Lower Leg o 3 Layer Compression System - Right Lower Extremity -  May  use 2 layer at home o Elevate legs to the level of the heart and pump ankles as often as possible Wound #2 Right,Distal,Medial Lower Leg o 3 Layer Compression System - Right Lower Extremity - May use 2 layer at home o Elevate legs to the level of the heart and pump ankles as often as possible Wound #3 Right,Medial Lower Leg o 3 Layer Compression System - Right Lower Extremity - May use 2 layer at home o Elevate legs to the level of the heart and pump ankles as often as possible Home Health Wound #1 Right,Proximal,Medial Lower Leg o D/C Home Health Services Wound #2 Right,Distal,Medial Lower Leg o D/C Home Health Services Wound #3 Right,Medial Lower Leg o D/C Home Health Services Electronic Signature(s) Signed: 12/25/2018 5:28:46 PM By: Linton Ham MD Signed: 12/27/2018 10:04:45 AM By: Gretta Cool, BSN, RN, CWS, Kim RN, BSN Entered By: Gretta Cool, BSN, RN, CWS, Kim on 12/25/2018 09:09:04 Kenneth Cline ([id number]) -------------------------------------------------------------------------------- Problem List Details Patient Name: Kenneth Cline Date of Service: 12/25/2018 8:30 AM Medical Record Number: [id number] Patient Account Number: [id number] Date of Birth/Sex: Jun 26, 1955 (63 y.o. M) Treating RN: Cornell Barman Primary Care Provider: Jenna Luo Other Clinician: Referring Provider: Jenna Luo Treating Provider/Extender: Tito Dine in Treatment: 2 Active Problems ICD-10 Evaluated Encounter Code Description Active Date Today Diagnosis L97.818 Non-pressure chronic ulcer of other part of right lower leg 12/11/2018 No Yes with other specified severity T81.31XA Disruption of external operation (surgical) wound, not 12/11/2018 No Yes elsewhere classified, initial encounter E11.622 Type 2 diabetes mellitus with other skin ulcer 12/18/2018 No Yes Inactive Problems Resolved Problems Electronic Signature(s) Signed: 12/25/2018 5:28:46 PM By: Linton Ham  MD Entered By: Linton Ham on 12/25/2018 09:23:38 Kenneth Cline ([id number]) -------------------------------------------------------------------------------- Progress Note Details Patient Name: Kenneth Cline Date of Service: 12/25/2018 8:30 AM Medical Record Number: [id number] Patient Account Number: [id number] Date of Birth/Sex: [birth year] (63 y.o. M) Treating RN: Cornell Barman Primary Care Provider: Jenna Luo Other Clinician: Referring Provider: Jenna Luo Treating Provider/Extender: Tito Dine in Treatment: 2 Subjective History of Present Illness (HPI) ADMISSION 12/11/2018 This is a 63 year old man who is a type II diabetic. He apparently developed a "boil" on his right lower leg sometime in mid June. He subsequently went to the beach and was in the ocean. He was admitted to [facility] from 6/20 through 6/30 with septic shock felt to be secondary from cellulitis in the right lower leg. There was concern for compartment syndrome and on 6/20 he underwent a right leg fasciotomy by Dr. Ardeth Sportsman of surgery. He received different courses of antibiotics. As far as I am able to determine the wound had an MSSA infection based on follow-up notes from general surgery but I cannot see the actual culture result. In any case he was discharged on doxycycline. He also had a wound VAC on this for a period of time. He saw general surgery in follow-up on 11/14/2018 and I am able to see this note in [facility] link. He had an "right lower extremity MSSA infection". At that point his wounds were 2 measuring 3 x 4 x 5x2 wound VAC was discontinued at that time and he was put on a wet to dry dressing. He also was given clindamycin for the recurrent wound infection. He was scheduled actually to go to the OR for further debridement although I cannot see that this was actually done. He tells me he went to the wound care center at [facility] on 3 occasions. By the time he  was seen at the wound care center on 8 5 there were 3 wounds all requiring debridement. They were dressed with Iodosorb and poly-man. On 8/12 a wound culture was done that showed light Pseudomonas and a few Staphylococcus although he was not started on antibiotics out of concern for a drug interaction with Coumadin. He actually lives in [place] so he is come to our clinic today for follow-up. 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 His ABIs in our clinic were 1.27 on the right and 1.2 on the left. 8/26; apparently home health was not moistening the Hydrofera Blue classic so it stuck to the wound nevertheless the major wound is down in size. There are 3 open areas the original surgical site and then just laterally to this a more proximal small wound and more distally a larger wound. I did not put him on antibiotics last week has nothing look infected. Still nothing looks infected today 9/2; we have been using Hydrofera Blue. He wants to dismiss home health. We have him in 3 layer compression. His wife apparently is able to do Curlex Coban and has this at home. He is going to [employer] next week and wants a 2-week follow- up. The original surgical wound I think is healed although it has some surface eschar on it. The small superior wound is closed and his major wound is smaller Objective Constitutional Sitting or standing Blood Pressure is within target range for patient.. Pulse regular and within target range for patient.Marland Kitchen Respirations regular, non-labored and within target range.. Temperature is normal and within the target range for the patient.Marland Kitchen Kenneth Cline, Kenneth L. ([id number]) appears in no distress. Vitals Time Taken: 8:38 AM, Height: 69  in, Weight: 183 lbs, BMI: 27, Temperature: 98.4 F, Pulse: 89 bpm, Respiratory Rate: 16 breaths/min, Blood Pressure: 141/86 mmHg. Eyes Conjunctivae clear. No discharge. Respiratory Respiratory effort is easy and symmetric bilaterally. Rate is normal at rest and on room air.. Cardiovascular Pedal pulses palpable. Edema is well controlled. Psychiatric No evidence of depression, anxiety, or agitation. Calm, cooperative, and communicative. Appropriate interactions and affect.. General Notes: Wound exam; the patient has 2 areas that are healed the original surgical incision and the small wound superiorly. The major wound is inferiorly and that is smaller. Some eschar around the surface removed with gauze. Surface of the wound looks healthy. Dimensions are better. Integumentary (Hair, Skin) No erythema around the wound. Wound #1 status is Open. Original cause of wound was Gradually Appeared. The wound is located on the Right,Proximal,Medial Lower Leg. The wound measures 0.1cm length x 0.1cm width x 0.1cm depth; 0.008cm^2 area and 0.001cm^3 volume. The wound is limited to skin breakdown. There is no tunneling or undermining noted. There is a none present amount of drainage noted. The wound margin is flat and intact. There is no granulation within the wound bed. There is a large (67-100%) amount of necrotic tissue within the wound bed including Eschar. Wound #2 status is Open. Original cause of wound was Gradually Appeared. The wound is located on the Right,Distal,Medial Lower Leg. The wound measures 3.2cm length x 1.7cm width x 0.2cm depth; 4.273cm^2 area and 0.855cm^3 volume. There is Fat Layer (Subcutaneous Tissue) Exposed exposed. There is no tunneling or undermining noted. There is a medium amount of  serous drainage noted. The wound margin is flat and intact. There is large (67-100%) red granulation within the wound bed. There is a small (1-33%) amount of necrotic tissue within the wound bed  including Adherent Slough. Wound #3 status is Open. Original cause of wound was Surgical Injury. The wound is located on the Right,Medial Lower Leg. The wound measures 5cm length x 0.5cm width x 0.1cm depth; 1.963cm^2 area and 0.196cm^3 volume. The wound is limited to skin breakdown. There is no tunneling or undermining noted. There is a none present amount of drainage noted. The wound margin is flat and intact. There is no granulation within the wound bed. There is a large (67-100%) amount of necrotic tissue within the wound bed including Eschar. Assessment Active Problems ICD-10 Non-pressure chronic ulcer of other part of right lower leg with other specified severity Disruption of external operation (surgical) wound, not elsewhere classified, initial encounter Type 2 diabetes mellitus with other skin ulcer Kenneth Cline, Kenneth L. ([id number]) Procedures Wound #1 Pre-procedure diagnosis of Wound #1 is a Cellulitis located on the Right,Proximal,Medial Lower Leg . There was a Three Layer Compression Therapy Procedure by Cornell Barman, RN. Post procedure Diagnosis Wound #1: Same as Pre-Procedure Wound #2 Pre-procedure diagnosis of Wound #2 is a Cellulitis located on the Right,Distal,Medial Lower Leg . There was a Three Layer Compression Therapy Procedure by Cornell Barman, RN. Post procedure Diagnosis Wound #2: Same as Pre-Procedure Wound #3 Pre-procedure diagnosis of Wound #3 is an Open Surgical Wound located on the Right,Medial Lower Leg . There was a Three Layer Compression Therapy Procedure by Cornell Barman, RN. Post procedure Diagnosis Wound #3: Same as Pre-Procedure Plan Wound Cleansing: Wound #1 Right,Proximal,Medial Lower Leg: Cleanse wound with mild soap and water May Shower, gently pat wound dry prior to applying new dressing. Wound #2 Right,Distal,Medial Lower Leg: Cleanse wound with mild soap and water May Shower, gently pat wound dry prior to applying new dressing. Wound #3 Right,Medial  Lower Leg: Cleanse wound with mild soap and water May Shower, gently pat wound dry prior to applying new dressing. Anesthetic (add to Medication List): Wound #1 Right,Proximal,Medial Lower Leg: Topical Lidocaine 4% cream applied to wound bed prior to debridement (In Clinic Only). Wound #2 Right,Distal,Medial Lower Leg: Topical Lidocaine 4% cream applied to wound bed prior to debridement (In Clinic Only). Wound #3 Right,Medial Lower Leg: Topical Lidocaine 4% cream applied to wound bed prior to debridement (In Clinic Only). Primary Wound Dressing: Wound #1 Right,Proximal,Medial Lower Leg: Hydrafera Blue Ready Transfer Wound #2 Right,Distal,Medial Lower Leg: Hydrafera Blue Ready Transfer Wound #3 Right,Medial Lower Leg: Hydrafera Blue Ready Transfer Secondary Dressing: Wound #1 Right,Proximal,Medial Lower Leg: ABD pad Wound #2 Right,Distal,Medial Lower Leg: ABD pad Kenneth Cline, Kenneth L. ([id number]) Wound #3 Right,Medial Lower Leg: ABD pad Dressing Change Frequency: Wound #1 Right,Proximal,Medial Lower Leg: Change Dressing Monday, Wednesday, Friday Wound #2 Right,Distal,Medial Lower Leg: Change Dressing Monday, Wednesday, Friday Wound #3 Right,Medial Lower Leg: Change Dressing Monday, Wednesday, Friday Follow-up Appointments: Wound #1 Right,Proximal,Medial Lower Leg: Return Appointment in 2 weeks. - Wife will change at one week. Wound #2 Right,Distal,Medial Lower Leg: Return Appointment in 2 weeks. - Wife will change at one week. Wound #3 Right,Medial Lower Leg: Return Appointment in 2 weeks. - Wife will change at one week. Edema Control: Wound #1 Right,Proximal,Medial Lower Leg: 3 Layer Compression System - Right Lower Extremity - May use 2 layer at home Elevate legs to the level of the heart and pump ankles as often as possible Wound #2 Right,Distal,Medial  Lower Leg: 3 Layer Compression System - Right Lower Extremity - May use 2 layer at home Elevate legs to the level of the  heart and pump ankles as often as possible Wound #3 Right,Medial Lower Leg: 3 Layer Compression System - Right Lower Extremity - May use 2 layer at home Elevate legs to the level of the heart and pump ankles as often as possible Home Health: Wound #1 Right,Proximal,Medial Lower Leg: D/C Home Health Services Wound #2 Right,Distal,Medial Lower Leg: D/C Home Health Services Wound #3 Right,Medial Lower Leg: D/C Home Health Services 1. Using Hydrofera Blue to the remaining wound area we put him in 3 layer compression although the kerlix Coban is fine. He wants to dismiss home health and is going to the beach next week we will see him back in 2 weeks. Cautioned not to get in salt water, pools or hot tubs Electronic Signature(s) Signed: 12/25/2018 5:28:46 PM By: Linton Ham MD Entered By: Linton Ham on 12/25/2018 09:27:53 Kenneth Cline, Kenneth Cline ([id number]) -------------------------------------------------------------------------------- SuperBill Details Patient Name: Kenneth Cline Date of Service: 12/25/2018 Medical Record Number: [id number] Patient Account Number: [id number] Date of Birth/Sex: Nov 10, 1955 (63 y.o. M) Treating RN: Cornell Barman Primary Care Provider: Jenna Luo Other Clinician: Referring Provider: Jenna Luo Treating Provider/Extender: Tito Dine in Treatment: 2 Diagnosis Coding ICD-10 Codes Code Description [phone] Non-pressure chronic ulcer of other part of right lower leg with other specified severity T81.31XA Disruption of external operation (surgical) wound, not elsewhere classified, initial encounter E11.622 Type 2 diabetes mellitus with other skin ulcer Facility Procedures CPT4 Code Description: [id number] (Facility Use Only) [phone] - [facility] LWR RT LEG Modifier: Quantity: 1 Physician Procedures CPT4: Description Modifier Quantity Code [id number] - WC PHYS LEVEL 3 - EST PT 1 ICD-10 Diagnosis Description L97.818 Non-pressure  chronic ulcer of other part of right lower leg with other specified severity T81.31XA Disruption of external operation  (surgical) wound, not elsewhere classified, initial encounter E11.622 Type 2 diabetes mellitus with other skin ulcer Electronic Signature(s) Signed: 12/25/2018 5:28:46 PM By: Linton Ham MD Entered By: Linton Ham on 12/25/2018 09:28:16

## 2018-12-27 NOTE — Progress Notes (Signed)
Kenneth Cline ([id number]) Visit Report for 12/25/2018 Arrival Information Details Patient Name: Kenneth Cline, Kenneth Cline Date of Service: 12/25/2018 8:30 AM Medical Record Number: [id number] Patient Account Number: [id number] Date of Birth/Sex: [birth year] (63 y.o. M) Treating RN: Cornell Barman Primary Care Jerriyah Louis: Jenna Luo Other Clinician: Referring Kili Gracy: Jenna Luo Treating Cutberto Winfree/Extender: Tito Dine in Treatment: 2 Visit Information History Since Last Visit Added or deleted any medications: No Patient Arrived: Ambulatory Any new allergies or adverse reactions: No Arrival Time: 08:39 Had a fall or experienced change in No Accompanied By: self activities of daily living that may affect Transfer Assistance: None risk of falls: Patient Identification Verified: Yes Signs or symptoms of abuse/neglect since last visito No Secondary Verification Process Yes Hospitalized since last visit: No Completed: Implantable device outside of the clinic excluding No Patient Has Alerts: Yes cellular tissue based products placed in the center Patient Alerts: Patient on Blood since last visit: Thinner Has Dressing in Place as Prescribed: Yes Warfarin Has Compression in Place as Prescribed: Yes ABI L 1.2 R 1.27 Pain Present Now: No Electronic Signature(s) Signed: 12/25/2018 11:18:22 AM By: Lorine Bears RCP, RRT, CHT Entered By: Lorine Bears on 12/25/2018 08:40:08 Kenneth Cline ([id number]) -------------------------------------------------------------------------------- Compression Therapy Details Patient Name: Kenneth Cline Date of Service: 12/25/2018 8:30 AM Medical Record Number: [id number] Patient Account Number: [id number] Date of Birth/Sex: 1955-11-16 (63 y.o. M) Treating RN: Cornell Barman Primary Care Delynn Olvera: Jenna Luo Other Clinician: Referring Alany Borman: Jenna Luo Treating Timiyah Romito/Extender: Tito Dine in  Treatment: 2 Compression Therapy Performed for Wound Assessment: Wound #1 Right,Proximal,Medial Lower Leg Performed By: Clinician Cornell Barman, RN Compression Type: Three Layer Post Procedure Diagnosis Same as Pre-procedure Electronic Signature(s) Signed: 12/27/2018 10:04:45 AM By: Gretta Cool, BSN, RN, CWS, Kim RN, BSN Entered By: Gretta Cool, BSN, RN, CWS, Kim on 12/25/2018 09:10:03 Kenneth Cline ([id number]) -------------------------------------------------------------------------------- Compression Therapy Details Patient Name: Kenneth Cline Date of Service: 12/25/2018 8:30 AM Medical Record Number: [id number] Patient Account Number: [id number] Date of Birth/Sex: Dec 06, 1955 (63 y.o. M) Treating RN: Cornell Barman Primary Care Madicyn Mesina: Jenna Luo Other Clinician: Referring Amica Harron: Jenna Luo Treating Feliza Diven/Extender: Tito Dine in Treatment: 2 Compression Therapy Performed for Wound Assessment: Wound #2 Right,Distal,Medial Lower Leg Performed By: Clinician Cornell Barman, RN Compression Type: Three Layer Post Procedure Diagnosis Same as Pre-procedure Electronic Signature(s) Signed: 12/27/2018 10:04:45 AM By: Gretta Cool, BSN, RN, CWS, Kim RN, BSN Entered By: Gretta Cool, BSN, RN, CWS, Kim on 12/25/2018 09:10:03 Kenneth Cline ([id number]) -------------------------------------------------------------------------------- Compression Therapy Details Patient Name: Kenneth Cline Date of Service: 12/25/2018 8:30 AM Medical Record Number: [id number] Patient Account Number: [id number] Date of Birth/Sex: 05-12-1955 (63 y.o. M) Treating RN: Cornell Barman Primary Care Barrett Holthaus: Jenna Luo Other Clinician: Referring Paislee Szatkowski: Jenna Luo Treating Usama Harkless/Extender: Tito Dine in Treatment: 2 Compression Therapy Performed for Wound Assessment: Wound #3 Right,Medial Lower Leg Performed By: Clinician Cornell Barman, RN Compression Type: Three Layer Post Procedure Diagnosis Same as  Pre-procedure Electronic Signature(s) Signed: 12/27/2018 10:04:45 AM By: Gretta Cool, BSN, RN, CWS, Kim RN, BSN Entered By: Gretta Cool, BSN, RN, CWS, Kim on 12/25/2018 09:10:03 Kenneth Cline ([id number]) -------------------------------------------------------------------------------- Encounter Discharge Information Details Patient Name: Kenneth Cline Date of Service: 12/25/2018 8:30 AM Medical Record Number: [id number] Patient Account Number: [id number] Date of Birth/Sex: 30-Jan-1956 (63 y.o. M) Treating RN: Cornell Barman Primary Care Rajon Bisig: Jenna Luo Other Clinician: Referring Janyah Singleterry: Jenna Luo Treating Camyla Camposano/Extender: Tito Dine in Treatment: 2 Encounter Discharge Information Items Discharge  Condition: Stable Ambulatory Status: Ambulatory Discharge Destination: Home Transportation: Private Auto Accompanied By: self Schedule Follow-up Appointment: Yes Clinical Summary of Care: Electronic Signature(s) Signed: 12/27/2018 10:04:45 AM By: Gretta Cool, BSN, RN, CWS, Kim RN, BSN Entered By: Gretta Cool, BSN, RN, CWS, Kim on 12/25/2018 09:11:58 Kenneth Cline ([id number]) -------------------------------------------------------------------------------- Lower Extremity Assessment Details Patient Name: Kenneth Cline Date of Service: 12/25/2018 8:30 AM Medical Record Number: [id number] Patient Account Number: [id number] Date of Birth/Sex: [birth year] (63 y.o. M) Treating RN: Montey Hora Primary Care Lylee Corrow: Jenna Luo Other Clinician: Referring Tyrease Vandeberg: Jenna Luo Treating Wyonia Fontanella/Extender: Tito Dine in Treatment: 2 Edema Assessment Assessed: [Left: No] [Right: No] Edema: [Left: N] [Right: o] Calf Left: Right: Point of Measurement: 31 cm From Medial Instep cm 34.5 cm Ankle Left: Right: Point of Measurement: 11 cm From Medial Instep cm 22 cm Vascular Assessment Pulses: Dorsalis Pedis Palpable: [Right:Yes] Electronic Signature(s) Signed: 12/25/2018  4:15:15 PM By: Montey Hora Entered By: Montey Hora on 12/25/2018 08:44:55 Kenneth Cline ([id number]) -------------------------------------------------------------------------------- Multi Wound Chart Details Patient Name: Kenneth Cline Date of Service: 12/25/2018 8:30 AM Medical Record Number: [id number] Patient Account Number: [id number] Date of Birth/Sex: [birth year] (63 y.o. M) Treating RN: Cornell Barman Primary Care Laney Louderback: Jenna Luo Other Clinician: Referring Sheikh Leverich: Jenna Luo Treating Georgiann Neider/Extender: Tito Dine in Treatment: 2 Vital Signs Height(in): 69 Pulse(bpm): 89 Weight(lbs): 183 Blood Pressure(mmHg): 141/86 Body Mass Index(BMI): 27 Temperature(F): 98.4 Respiratory Rate 16 (breaths/min): Photos: Wound Location: Right Lower Leg - Medial, Right Lower Leg - Medial, Right Lower Leg - Medial Proximal Distal Wounding Event: Gradually Appeared Gradually Appeared Surgical Injury Primary Etiology: Cellulitis Cellulitis Open Surgical Wound Comorbid History: Type II Diabetes Type II Diabetes Type II Diabetes Date Acquired: 10/07/2018 10/07/2018 10/12/2018 Weeks of Treatment: 2 2 2  Wound Status: Open Open Open Measurements L x W x D 0.1x0.1x0.1 3.2x1.7x0.2 5x0.5x0.1 (cm) Area (cm) : 0.008 4.273 1.963 Volume (cm) : 0.001 0.855 0.196 % Reduction in Area: 98.90% 50.50% 54.60% % Reduction in Volume: 99.50% 75.30% 54.60% Classification: Full Thickness Without Full Thickness Without Full Thickness Without Exposed Support Structures Exposed Support Structures Exposed Support Structures Exudate Amount: None Present Medium None Present Exudate Type: [name] Serous [name] Exudate Color: [name] amber [name] Wound Margin: Flat and Intact Flat and Intact Flat and Intact Granulation Amount: None Present (0%) Large (67-100%) None Present (0%) Granulation Quality: [name] Red [name] Necrotic Amount: Large (67-100%) Small (1-33%) Large (67-100%) Necrotic Tissue: Eschar  Adherent Slough Eschar Exposed Structures: Fascia: No Fat Layer (Subcutaneous Fascia: No Fat Layer (Subcutaneous Tissue) Exposed: Yes Fat Layer (Subcutaneous Tissue) Exposed: No Fascia: No Tissue) Exposed: No Tendon: No Tendon: No Tendon: No Muscle: No Muscle: No Muscle: No Hoopingarner, Gopal L. ([id number]) Joint: No Joint: No Joint: No Bone: No Bone: No Bone: No Limited to Skin Breakdown Limited to Skin Breakdown Epithelialization: Large (67-100%) None Large (67-100%) Procedures Performed: Compression Therapy Compression Therapy Compression Therapy Treatment Notes Wound #1 (Right, Proximal, Medial Lower Leg) Notes Hydrofera Blue, ABD, 3 layer, unna paste. Wound #2 (Right, Distal, Medial Lower Leg) Notes Hydrofera Blue, ABD, 3 layer, unna paste. Wound #3 (Right, Medial Lower Leg) Notes Hydrofera Blue, ABD, 3 layer, unna paste. Electronic Signature(s) Signed: 12/25/2018 5:28:46 PM By: Linton Ham MD Entered By: Linton Ham on 12/25/2018 09:23:49 Kenneth Cline ([id number]) -------------------------------------------------------------------------------- Multi-Disciplinary Care Plan Details Patient Name: Kenneth Cline Date of Service: 12/25/2018 8:30 AM Medical Record Number: [id number] Patient Account Number: [id number] Date of Birth/Sex: [birth year] (63 y.o. M) Treating RN:  Cornell Barman Primary Care Adric Wrede: Jenna Luo Other Clinician: Referring Asbury Hair: Jenna Luo Treating Laney Bagshaw/Extender: Tito Dine in Treatment: 2 Active Inactive Medication Nursing Diagnoses: Knowledge deficit related to medication safety: actual or potential Goals: Patient/caregiver will demonstrate understanding of all current medications Date Initiated: 12/11/2018 Target Resolution Date: 12/11/2018 Goal Status: Active Interventions: Assess for medication contraindications each visit where new medications are prescribed Treatment Activities: New medication  prescribed at [place] : 12/11/2018 Notes: Necrotic Tissue Nursing Diagnoses: Impaired tissue integrity related to necrotic/devitalized tissue Goals: Necrotic/devitalized tissue will be minimized in the wound bed Date Initiated: 12/11/2018 Target Resolution Date: 12/10/2018 Goal Status: Active Interventions: Assess patient pain level pre-, during and post procedure and prior to discharge Treatment Activities: Excisional debridement : 12/11/2018 Notes: Soft Tissue Infection Nursing Diagnoses: Impaired tissue integrity Potential for infection: soft tissue Jowett, Traeton L. ([id number]) Goals: Patient will remain free of wound infection Date Initiated: 12/11/2018 Target Resolution Date: 12/18/2018 Goal Status: Active Interventions: Assess signs and symptoms of infection every visit Notes: Wound/Skin Impairment Nursing Diagnoses: Impaired tissue integrity Goals: Patient/caregiver will verbalize understanding of skin care regimen Date Initiated: 12/11/2018 Target Resolution Date: 12/18/2018 Goal Status: Active Ulcer/skin breakdown will have a volume reduction of 30% by week 4 Date Initiated: 12/11/2018 Target Resolution Date: 01/11/2019 Goal Status: Active Interventions: Assess ulceration(s) every visit Treatment Activities: Referred to DME Dawnelle Warman for dressing supplies : 12/11/2018 Skin care regimen initiated : 12/11/2018 Notes: Electronic Signature(s) Signed: 12/27/2018 10:04:45 AM By: Gretta Cool, BSN, RN, CWS, Kim RN, BSN Entered By: Gretta Cool, BSN, RN, CWS, Kim on 12/25/2018 09:05:42 Kenneth Cline ([id number]) -------------------------------------------------------------------------------- Pain Assessment Details Patient Name: Kenneth Cline Date of Service: 12/25/2018 8:30 AM Medical Record Number: [id number] Patient Account Number: [id number] Date of Birth/Sex: December 14, 1955 (63 y.o. M) Treating RN: Cornell Barman Primary Care Laster Appling: Jenna Luo Other Clinician: Referring  Vuk Skillern: Jenna Luo Treating Tavia Stave/Extender: Tito Dine in Treatment: 2 Active Problems Location of Pain Severity and Description of Pain Patient Has Paino No Site Locations Pain Management and Medication Current Pain Management: Electronic Signature(s) Signed: 12/25/2018 11:18:22 AM By: Lorine Bears RCP, RRT, CHT Signed: 12/27/2018 10:04:45 AM By: Gretta Cool, BSN, RN, CWS, Kim RN, BSN Entered By: Lorine Bears on 12/25/2018 08:40:16 Kenneth Cline ([id number]) -------------------------------------------------------------------------------- Patient/Caregiver Education Details Patient Name: Kenneth Cline Date of Service: 12/25/2018 8:30 AM Medical Record Number: [id number] Patient Account Number: [id number] Date of Birth/Gender: 1955-11-11 (63 y.o. M) Treating RN: Cornell Barman Primary Care Physician: Jenna Luo Other Clinician: Referring Physician: Jenna Luo Treating Physician/Extender: Tito Dine in Treatment: 2 Education Assessment Education Provided To: Patient Education Topics Provided Venous: Handouts: Controlling Swelling with Multilayered Compression Wraps Methods: Demonstration, Explain/Verbal Responses: State content correctly Wound/Skin Impairment: Handouts: Caring for Your Ulcer Methods: Demonstration, Explain/Verbal Responses: State content correctly Electronic Signature(s) Signed: 12/27/2018 10:04:45 AM By: Gretta Cool, BSN, RN, CWS, Kim RN, BSN Entered By: Gretta Cool, BSN, RN, CWS, Kim on 12/25/2018 09:10:45 Kenneth Cline ([id number]) -------------------------------------------------------------------------------- Wound Assessment Details Patient Name: Kenneth Cline Date of Service: 12/25/2018 8:30 AM Medical Record Number: [id number] Patient Account Number: [id number] Date of Birth/Sex: November 26, 1955 (63 y.o. M) Treating RN: Montey Hora Primary Care Bettyjane Shenoy: Jenna Luo Other Clinician: Referring  Liyanna Cartwright: Jenna Luo Treating Alphonsine Minium/Extender: Tito Dine in Treatment: 2 Wound Status Wound Number: 1 Primary Etiology: Cellulitis Wound Location: Right Lower Leg - Medial, Proximal Wound Status: Open Wounding Event: Gradually Appeared Comorbid History: Type II Diabetes Date Acquired: 10/07/2018 Weeks Of Treatment: 2  Clustered Wound: No Photos Wound Measurements Length: (cm) 0.1 Width: (cm) 0.1 Depth: (cm) 0.1 Area: (cm) 0.008 Volume: (cm) 0.001 % Reduction in Area: 98.9% % Reduction in Volume: 99.5% Epithelialization: Large (67-100%) Tunneling: No Undermining: No Wound Description Full Thickness Without Exposed Support Classification: Structures Wound Margin: Flat and Intact Exudate None Present Amount: Foul Odor After Cleansing: No Slough/Fibrino No Wound Bed Granulation Amount: None Present (0%) Exposed Structure Necrotic Amount: Large (67-100%) Fascia Exposed: No Necrotic Quality: Eschar Fat Layer (Subcutaneous Tissue) Exposed: No Tendon Exposed: No Muscle Exposed: No Joint Exposed: No Bone Exposed: No Limited to Skin Breakdown Treatment Notes Alfred, Niranjan L. ([id number]) Wound #1 (Right, Proximal, Medial Lower Leg) Notes Hydrofera Blue, ABD, 3 layer, unna paste. Electronic Signature(s) Signed: 12/25/2018 4:15:15 PM By: Montey Hora Entered By: Montey Hora on 12/25/2018 08:50:00 Lapine, Campbell Cline ([id number]) -------------------------------------------------------------------------------- Wound Assessment Details Patient Name: Kenneth Cline Date of Service: 12/25/2018 8:30 AM Medical Record Number: [id number] Patient Account Number: [id number] Date of Birth/Sex: Dec 25, 1955 (63 y.o. M) Treating RN: Montey Hora Primary Care Shiron Whetsel: Jenna Luo Other Clinician: Referring Daphne Karrer: Jenna Luo Treating Alliya Marcon/Extender: Tito Dine in Treatment: 2 Wound Status Wound Number: 2 Primary Etiology:  Cellulitis Wound Location: Right Lower Leg - Medial, Distal Wound Status: Open Wounding Event: Gradually Appeared Comorbid History: Type II Diabetes Date Acquired: 10/07/2018 Weeks Of Treatment: 2 Clustered Wound: No Photos Wound Measurements Length: (cm) 3.2 Width: (cm) 1.7 Depth: (cm) 0.2 Area: (cm) 4.273 Volume: (cm) 0.855 % Reduction in Area: 50.5% % Reduction in Volume: 75.3% Epithelialization: None Tunneling: No Undermining: No Wound Description Full Thickness Without Exposed Support Classification: Structures Wound Margin: Flat and Intact Exudate Medium Amount: Exudate Type: Serous Exudate Color: amber Foul Odor After Cleansing: No Slough/Fibrino Yes Wound Bed Granulation Amount: Large (67-100%) Exposed Structure Granulation Quality: Red Fascia Exposed: No Necrotic Amount: Small (1-33%) Fat Layer (Subcutaneous Tissue) Exposed: Yes Necrotic Quality: Adherent Slough Tendon Exposed: No Muscle Exposed: No Joint Exposed: No Bone Exposed: No Derstine, Abdulwahab L. ([id number]) Treatment Notes Wound #2 (Right, Distal, Medial Lower Leg) Notes Hydrofera Blue, ABD, 3 layer, unna paste. Electronic Signature(s) Signed: 12/25/2018 4:15:15 PM By: Montey Hora Entered By: Montey Hora on 12/25/2018 08:50:22 Kenneth Cline ([id number]) -------------------------------------------------------------------------------- Wound Assessment Details Patient Name: Kenneth Cline Date of Service: 12/25/2018 8:30 AM Medical Record Number: [id number] Patient Account Number: [id number] Date of Birth/Sex: [birth year] (63 y.o. M) Treating RN: Montey Hora Primary Care Tinzlee Craker: Jenna Luo Other Clinician: Referring Fahima Cifelli: Jenna Luo Treating Kelley Polinsky/Extender: Tito Dine in Treatment: 2 Wound Status Wound Number: 3 Primary Etiology: Open Surgical Wound Wound Location: Right Lower Leg - Medial Wound Status: Open Wounding Event: Surgical Injury Comorbid  History: Type II Diabetes Date Acquired: 10/12/2018 Weeks Of Treatment: 2 Clustered Wound: No Photos Wound Measurements Length: (cm) 5 Width: (cm) 0.5 Depth: (cm) 0.1 Area: (cm) 1.963 Volume: (cm) 0.196 % Reduction in Area: 54.6% % Reduction in Volume: 54.6% Epithelialization: Large (67-100%) Tunneling: No Undermining: No Wound Description Full Thickness Without Exposed Support Classification: Structures Wound Margin: Flat and Intact Exudate None Present Amount: Foul Odor After Cleansing: No Slough/Fibrino No Wound Bed Granulation Amount: None Present (0%) Exposed Structure Necrotic Amount: Large (67-100%) Fascia Exposed: No Necrotic Quality: Eschar Fat Layer (Subcutaneous Tissue) Exposed: No Tendon Exposed: No Muscle Exposed: No Joint Exposed: No Bone Exposed: No Limited to Skin Breakdown Treatment Notes Castro, Braelen L. ([id number]) Wound #3 (Right, Medial Lower Leg) Notes Hydrofera Blue, ABD, 3 layer, unna paste. Electronic Signature(s)  Signed: 12/25/2018 4:15:15 PM By: Montey Hora Entered By: Montey Hora on 12/25/2018 08:51:02 Kenneth Cline ([id number]) -------------------------------------------------------------------------------- [place] Details Patient Name: Kenneth Cline Date of Service: 12/25/2018 8:30 AM Medical Record Number: [id number] Patient Account Number: [id number] Date of Birth/Sex: 1955/10/23 (63 y.o. M) Treating RN: Cornell Barman Primary Care Amaranta Mehl: Jenna Luo Other Clinician: Referring Sheriff Rodenberg: Jenna Luo Treating Avigdor Dollar/Extender: Tito Dine in Treatment: 2 Vital Signs Time Taken: 08:38 Temperature (F): 98.4 Height (in): 69 Pulse (bpm): 89 Weight (lbs): 183 Respiratory Rate (breaths/min): 16 Body Mass Index (BMI): 27 Blood Pressure (mmHg): 141/86 Reference Range: 80 - 120 mg / dl Electronic Signature(s) Signed: 12/25/2018 11:18:22 AM By: Lorine Bears RCP, RRT, CHT Entered By: Lorine Bears on 12/25/2018 08:41:16

## 2018-12-28 ENCOUNTER — Other Ambulatory Visit: Payer: Self-pay | Admitting: Cardiology

## 2019-01-06 ENCOUNTER — Ambulatory Visit (INDEPENDENT_AMBULATORY_CARE_PROVIDER_SITE_OTHER): Payer: Medicare HMO | Admitting: Pharmacist Clinician (PhC)/ Clinical Pharmacy Specialist

## 2019-01-06 DIAGNOSIS — Z7901 Long term (current) use of anticoagulants: Secondary | ICD-10-CM | POA: Diagnosis not present

## 2019-01-06 DIAGNOSIS — I823 Embolism and thrombosis of renal vein: Secondary | ICD-10-CM

## 2019-01-06 LAB — POCT INR: INR: 2.4 (ref 2.0–3.0)

## 2019-01-08 ENCOUNTER — Encounter: Payer: Medicare HMO | Admitting: Internal Medicine

## 2019-01-08 ENCOUNTER — Other Ambulatory Visit: Payer: Self-pay

## 2019-01-08 DIAGNOSIS — Z20822 Contact with and (suspected) exposure to covid-19: Secondary | ICD-10-CM

## 2019-01-08 DIAGNOSIS — R6889 Other general symptoms and signs: Secondary | ICD-10-CM | POA: Diagnosis not present

## 2019-01-09 LAB — NOVEL CORONAVIRUS, NAA: SARS-CoV-2, NAA: NOT DETECTED

## 2019-01-15 ENCOUNTER — Encounter: Payer: Medicare HMO | Admitting: Internal Medicine

## 2019-01-15 ENCOUNTER — Other Ambulatory Visit: Payer: Self-pay

## 2019-01-15 DIAGNOSIS — E11622 Type 2 diabetes mellitus with other skin ulcer: Secondary | ICD-10-CM | POA: Diagnosis not present

## 2019-01-15 DIAGNOSIS — E2740 Unspecified adrenocortical insufficiency: Secondary | ICD-10-CM | POA: Diagnosis not present

## 2019-01-15 DIAGNOSIS — Z86718 Personal history of other venous thrombosis and embolism: Secondary | ICD-10-CM | POA: Diagnosis not present

## 2019-01-15 DIAGNOSIS — E039 Hypothyroidism, unspecified: Secondary | ICD-10-CM | POA: Diagnosis not present

## 2019-01-15 DIAGNOSIS — T8189XA Other complications of procedures, not elsewhere classified, initial encounter: Secondary | ICD-10-CM | POA: Diagnosis not present

## 2019-01-15 DIAGNOSIS — L97819 Non-pressure chronic ulcer of other part of right lower leg with unspecified severity: Secondary | ICD-10-CM | POA: Diagnosis not present

## 2019-01-15 DIAGNOSIS — Z7901 Long term (current) use of anticoagulants: Secondary | ICD-10-CM | POA: Diagnosis not present

## 2019-01-15 DIAGNOSIS — D6862 Lupus anticoagulant syndrome: Secondary | ICD-10-CM | POA: Diagnosis not present

## 2019-01-15 DIAGNOSIS — I10 Essential (primary) hypertension: Secondary | ICD-10-CM | POA: Diagnosis not present

## 2019-01-15 DIAGNOSIS — L03115 Cellulitis of right lower limb: Secondary | ICD-10-CM | POA: Diagnosis not present

## 2019-01-16 NOTE — Progress Notes (Signed)
Kenneth Cline, Kenneth Cline ([id number]) Visit Report for 01/15/2019 Debridement Details Patient Name: Kenneth Cline, Kenneth Cline Date of Service: 01/15/2019 8:30 AM Medical Record Number: [id number] Patient Account Number: [id number] Date of Birth/Sex: 05-18-1955 (63 y.o. M) Treating RN: Cornell Barman Primary Care Provider: Jenna Luo Other Clinician: Referring Provider: Jenna Luo Treating Provider/Extender: Tito Dine in Treatment: 5 Debridement Performed for Wound #3 Right,Medial Lower Leg Assessment: Performed By: Physician Ricard Dillon, MD Debridement Type: Debridement Level of Consciousness (Pre- Awake and Alert procedure): Pre-procedure Verification/Time Yes - 08:35 Out Taken: Start Time: 08:35 Pain Control: Lidocaine Total Area Debrided (L x W): 1 (cm) x 0.8 (cm) = 0.8 (cm) Tissue and other material Non-Viable, Eschar, Slough, Skin: Epidermis, Slough debrided: Level: Skin/Epidermis Debridement Description: Selective/Open Wound Instrument: Curette Bleeding: Minimum Hemostasis Achieved: Pressure End Time: 08:36 Response to Treatment: Procedure was tolerated well Level of Consciousness Awake and Alert (Post-procedure): Post Debridement Measurements of Total Wound Length: (cm) 1 Width: (cm) 0.8 Depth: (cm) 0.1 Volume: (cm) 0.063 Character of Wound/Ulcer Post Debridement: Stable Post Procedure Diagnosis Same as Pre-procedure Electronic Signature(s) Signed: 01/15/2019 5:27:47 PM By: Linton Ham MD Signed: 01/15/2019 5:53:54 PM By: Gretta Cool, BSN, RN, CWS, Kim RN, BSN Entered By: Linton Ham on 01/15/2019 08:40:23 Reffitt, Kenneth Cline ([id number]) -------------------------------------------------------------------------------- HPI Details Patient Name: Kenneth Cline Date of Service: 01/15/2019 8:30 AM Medical Record Number: [id number] Patient Account Number: [id number] Date of Birth/Sex: 1955-07-26 (63 y.o. M) Treating RN: Cornell Barman Primary Care Provider:  Jenna Luo Other Clinician: Referring Provider: Jenna Luo Treating Provider/Extender: Tito Dine in Treatment: 5 History of Present Illness HPI Description: ADMISSION 12/11/2018 This is a 63 year old man who is a type II diabetic. He apparently developed a "boil" on his right lower leg sometime in mid June. He subsequently went to the beach and was in the ocean. He was admitted to [facility] from 6/20 through 6/30 with septic shock felt to be secondary from cellulitis in the right lower leg. There was concern for compartment syndrome and on 6/20 he underwent a right leg fasciotomy by Dr. Ardeth Sportsman of surgery. He received different courses of antibiotics. As far as I am able to determine the wound had an MSSA infection based on follow-up notes from general surgery but I cannot see the actual culture result. In any case he was discharged on doxycycline. He also had a wound VAC on this for a period of time. He saw general surgery in follow-up on 11/14/2018 and I am able to see this note in [place] link. He had an "right lower extremity MSSA infection". At that point his wounds were 2 measuring 3 x 4 x 5x2 wound VAC was discontinued at that time and he was put on a wet to dry dressing. He also was given clindamycin for the recurrent wound infection. He was scheduled actually to go to the OR for further debridement although I cannot see that this was actually done. He tells me he went to the wound care center at [facility] on 3 occasions. By the time he was seen at the wound care center on 8 5 there were 3 wounds all requiring debridement. They were dressed with Iodosorb and poly-man. On 8/12 a wound culture was done that showed light Pseudomonas and a few Staphylococcus although he was not started on antibiotics out of concern for a drug interaction with Coumadin. He actually lives in [place] so he is come to our clinic today for follow-up. 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 His ABIs in our clinic were 1.27 on the right and 1.2 on the left. 8/26; apparently home health was not moistening the Hydrofera Blue classic so it stuck to the wound nevertheless the major wound is down in size. There are 3 open areas the original surgical site and then just laterally to this a more proximal small wound and more distally a larger wound. I did not put him on antibiotics last week has nothing look infected. Still nothing looks infected today 9/2; we have been using Hydrofera Blue. He wants to dismiss home health. We have him in 3 layer compression. His wife apparently is able to do Curlex Coban and has this at home. He is going to [employer] next week and wants a 2-week follow- up. The original surgical wound I think is healed although it has some surface eschar on it. The small superior wound is closed and his major wound is smaller 9/23; using Hydrofera Blue. He was not able to come last week as he presented to the clinic with a fever. Subsequently tested Covid negative. Wound has improved, dimensions are improved Electronic Signature(s) Signed: 01/15/2019 5:27:47 PM By: Linton Ham MD Entered By: Linton Ham on 01/15/2019 08:41:08 Kenneth Cline ([id number]) -------------------------------------------------------------------------------- Physical Exam Details Patient Name: Kenneth Cline Date of Service: 01/15/2019 8:30 AM Medical Record Number: [id number] Patient Account Number: [id number] Date of Birth/Sex: 1955/11/26 (63 y.o. M) Treating RN: Cornell Barman Primary Care Provider: Jenna Luo Other Clinician: Referring Provider: Jenna Luo Treating Provider/Extender: Tito Dine in Treatment: 5 Constitutional Sitting or standing Blood Pressure is within target range for patient.. Pulse regular and within target range for patient.Marland Kitchen Respirations regular, non-labored and within target range.. Temperature is normal and within the target range for the patient.Marland Kitchen appears in no distress. Notes Wound exam; patient has only 1 remaining area on the lateral part of the tibia. Nonviable eschar and flaking skin around the wound was circumference debrided with a #5 curette. With removal of this he had nice-looking surface with epithelialization. Dimensions are better no evidence of surrounding infection Electronic Signature(s) Signed: 01/15/2019 5:27:47 PM By: Linton Ham MD Entered By: Linton Ham on 01/15/2019 08:42:02 Kenneth Cline ([id number]) -------------------------------------------------------------------------------- Physician Orders Details Patient Name: Kenneth Cline Date of Service: 01/15/2019 8:30 AM Medical Record Number: [id number] Patient Account Number: [id number] Date of Birth/Sex: [birth year] (63 y.o. M) Treating RN: Cornell Barman Primary Care Provider: Jenna Luo Other Clinician: Referring Provider: Jenna Luo Treating Provider/Extender: Tito Dine in Treatment: 5 Verbal / Phone Orders: No Diagnosis Coding Wound Cleansing Wound #1 Right,Proximal,Medial Lower Leg o Cleanse wound with mild soap and water o May Shower, gently pat wound dry prior to applying new dressing. Wound #2 Right,Distal,Medial Lower Leg o Cleanse wound with mild soap and water o May Shower, gently pat wound dry prior to applying new dressing. Wound #3 Right,Medial Lower Leg o Cleanse wound with mild soap and water o May Shower, gently pat wound dry prior to applying new dressing. Anesthetic (add to Medication List) Wound #1 Right,Proximal,Medial Lower Leg o Topical Lidocaine 4% cream applied to wound bed prior to debridement (In  Clinic Only). Wound #2 Right,Distal,Medial Lower Leg o Topical Lidocaine 4% cream applied to wound bed prior to debridement (In Clinic Only). Wound #3  Right,Medial Lower Leg o Topical Lidocaine 4% cream applied to wound bed prior to debridement (In Clinic Only). Primary Wound Dressing Wound #1 Right,Proximal,Medial Lower Leg o Hydrafera Blue Ready Transfer Wound #2 Right,Distal,Medial Lower Leg o Hydrafera Blue Ready Transfer Wound #3 Right,Medial Lower Leg o Hydrafera Blue Ready Transfer Secondary Dressing Wound #1 Right,Proximal,Medial Lower Leg o ABD pad Wound #2 Right,Distal,Medial Lower Leg o ABD pad Wound #3 Right,Medial Lower Leg o ABD pad Dressing Change Frequency Sprenkle, Eddison L. ([id number]) Wound #1 Right,Proximal,Medial Lower Leg o Change dressing every week Wound #2 Right,Distal,Medial Lower Leg o Change dressing every week Wound #3 Right,Medial Lower Leg o Change dressing every week Follow-up Appointments Wound #1 Right,Proximal,Medial Lower Leg o Return Appointment in 1 week. Wound #2 Right,Distal,Medial Lower Leg o Return Appointment in 1 week. Wound #3 Right,Medial Lower Leg o Return Appointment in 1 week. Edema Control Wound #1 Right,Proximal,Medial Lower Leg o 3 Layer Compression System - Right Lower Extremity o Elevate legs to the level of the heart and pump ankles as often as possible Wound #2 Right,Distal,Medial Lower Leg o 3 Layer Compression System - Right Lower Extremity o Elevate legs to the level of the heart and pump ankles as often as possible Wound #3 Right,Medial Lower Leg o 3 Layer Compression System - Right Lower Extremity o Elevate legs to the level of the heart and pump ankles as often as possible Electronic Signature(s) Signed: 01/15/2019 5:27:47 PM By: Linton Ham MD Signed: 01/15/2019 5:53:54 PM By: Gretta Cool, BSN, RN, CWS, Kim RN, BSN Entered By: Gretta Cool, BSN, RN, CWS, Kim on 01/15/2019  08:39:36 Kenneth Cline, Kenneth Cline ([id number]) -------------------------------------------------------------------------------- Problem List Details Patient Name: Kenneth Cline Date of Service: 01/15/2019 8:30 AM Medical Record Number: [id number] Patient Account Number: [id number] Date of Birth/Sex: Nov 30, 1955 (63 y.o. M) Treating RN: Cornell Barman Primary Care Provider: Jenna Luo Other Clinician: Referring Provider: Jenna Luo Treating Provider/Extender: Tito Dine in Treatment: 5 Active Problems ICD-10 Evaluated Encounter Code Description Active Date Today Diagnosis L97.818 Non-pressure chronic ulcer of other part of right lower leg 12/11/2018 No Yes with other specified severity T81.31XA Disruption of external operation (surgical) wound, not 12/11/2018 No Yes elsewhere classified, initial encounter E11.622 Type 2 diabetes mellitus with other skin ulcer 12/18/2018 No Yes Inactive Problems Resolved Problems Electronic Signature(s) Signed: 01/15/2019 5:27:47 PM By: Linton Ham MD Entered By: Linton Ham on 01/15/2019 08:39:50 Kenneth Cline, Kenneth Cline ([id number]) -------------------------------------------------------------------------------- Progress Note Details Patient Name: Kenneth Cline Date of Service: 01/15/2019 8:30 AM Medical Record Number: [id number] Patient Account Number: [id number] Date of Birth/Sex: 07-26-1955 (63 y.o. M) Treating RN: Cornell Barman Primary Care Provider: Jenna Luo Other Clinician: Referring Provider: Jenna Luo Treating Provider/Extender: Tito Dine in Treatment: 5 Subjective History of Present Illness (HPI) ADMISSION 12/11/2018 This is a 63 year old man who is a type II diabetic. He apparently developed a "boil" on his right lower leg sometime in mid June. He subsequently went to the beach and was in the ocean. He was admitted to [facility] from 6/20 through 6/30 with septic shock felt to be secondary from  cellulitis in the right lower leg. There was concern for compartment syndrome and on 6/20 he underwent a right leg fasciotomy by Dr. Ardeth Sportsman of surgery. He received different courses of antibiotics. As far as I am able to determine the wound had an MSSA infection based on follow-up notes from general surgery but I cannot see the actual culture result. In any case he was discharged on doxycycline.  He also had a wound VAC on this for a period of time. He saw general surgery in follow-up on 11/14/2018 and I am able to see this note in [facility] link. He had an "right lower extremity MSSA infection". At that point his wounds were 2 measuring 3 x 4 x 5x2 wound VAC was discontinued at that time and he was put on a wet to dry dressing. He also was given clindamycin for the recurrent wound infection. He was scheduled actually to go to the OR for further debridement although I cannot see that this was actually done. He tells me he went to the wound care center at [facility] on 3 occasions. By the time he was seen at the wound care center on 8 5 there were 3 wounds all requiring debridement. They were dressed with Iodosorb and poly-man. On 8/12 a wound culture was done that showed light Pseudomonas and a few Staphylococcus although he was not started on antibiotics out of concern for a drug interaction with Coumadin. He actually lives in [place] so he is come to our clinic today for follow-up. 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 His ABIs in our clinic were 1.27 on the right and 1.2 on the left. 8/26; apparently home health was not moistening the Hydrofera Blue classic so it stuck to the wound nevertheless the major wound is down in size. There  are 3 open areas the original surgical site and then just laterally to this a more proximal small wound and more distally a larger wound. I did not put him on antibiotics last week has nothing look infected. Still nothing looks infected today 9/2; we have been using Hydrofera Blue. He wants to dismiss home health. We have him in 3 layer compression. His wife apparently is able to do Curlex Coban and has this at home. He is going to [employer] next week and wants a 2-week follow- up. The original surgical wound I think is healed although it has some surface eschar on it. The small superior wound is closed and his major wound is smaller 9/23; using Hydrofera Blue. He was not able to come last week as he presented to the clinic with a fever. Subsequently tested Covid negative. Wound has improved, dimensions are improved Objective Constitutional Kenneth Cline, Kenneth L. ([id number]) Sitting or standing Blood Pressure is within target range for patient.. Pulse regular and within target range for patient.Marland Kitchen Respirations regular, non-labored and within target range.. Temperature is normal and within the target range for the patient.Marland Kitchen appears in no distress. Vitals Time Taken: 8:24 AM, Height: 69 in, Weight: 183 lbs, BMI: 27, Temperature: 99.1 F, Pulse: 99 bpm, Respiratory Rate: 16 breaths/min, Blood Pressure: 141/85 mmHg. General Notes: Wound exam; patient has only 1 remaining area on the lateral part of the tibia. Nonviable eschar and flaking skin around the wound was circumference debrided with a #5 curette. With removal of this he had nice-looking surface with epithelialization. Dimensions are better no evidence of surrounding infection Integumentary (Hair, Skin) Wound #1 status is Open. Original cause of wound was Gradually Appeared. The wound is located on the Right,Proximal,Medial Lower Leg. The wound measures 1cm length x 0.5cm width x 0.1cm depth; 0.393cm^2 area and 0.039cm^3 volume. The wound is  limited to skin breakdown. There is no tunneling or undermining  noted. There is a none present amount of drainage noted. The wound margin is flat and intact. There is no granulation within the wound bed. There is a large (67-100%) amount of necrotic tissue within the wound bed including Eschar. Wound #2 status is Open. Original cause of wound was Gradually Appeared. The wound is located on the Right,Distal,Medial Lower Leg. The wound measures 1.7cm length x 0.9cm width x 0.1cm depth; 1.202cm^2 area and 0.12cm^3 volume. There is Fat Layer (Subcutaneous Tissue) Exposed exposed. There is no tunneling or undermining noted. There is a medium amount of serous drainage noted. The wound margin is flat and intact. There is large (67-100%) red granulation within the wound bed. There is a small (1-33%) amount of necrotic tissue within the wound bed including Adherent Slough. Wound #3 status is Open. Original cause of wound was Surgical Injury. The wound is located on the Right,Medial Lower Leg. The wound measures 1cm length x 0.8cm width x 0.1cm depth; 0.628cm^2 area and 0.063cm^3 volume. The wound is limited to skin breakdown. There is no tunneling or undermining noted. There is a none present amount of drainage noted. The wound margin is flat and intact. There is no granulation within the wound bed. There is a large (67-100%) amount of necrotic tissue within the wound bed including Eschar. Assessment Active Problems ICD-10 Non-pressure chronic ulcer of other part of right lower leg with other specified severity Disruption of external operation (surgical) wound, not elsewhere classified, initial encounter Type 2 diabetes mellitus with other skin ulcer Procedures Wound #3 Pre-procedure diagnosis of Wound #3 is an Open Surgical Wound located on the Right,Medial Lower Leg . There was a Selective/Open Wound Skin/Epidermis Debridement with a total area of 0.8 sq cm performed by Ricard Dillon, MD. With  the following instrument(s): Curette to remove Non-Viable tissue/material. Material removed includes Eschar, Slough, and Skin: Epidermis after achieving pain control using Lidocaine. No specimens were taken. A time out was conducted at 08:35, prior to the start of the procedure. A Minimum amount of bleeding was controlled with Pressure. The procedure was tolerated well. Post Debridement Measurements: 1cm length x 0.8cm width x 0.1cm depth; 0.063cm^3 volume. Kenneth Cline, Kenneth Cline ([id number]) Character of Wound/Ulcer Post Debridement is stable. Post procedure Diagnosis Wound #3: Same as Pre-Procedure Plan Wound Cleansing: Wound #1 Right,Proximal,Medial Lower Leg: Cleanse wound with mild soap and water May Shower, gently pat wound dry prior to applying new dressing. Wound #2 Right,Distal,Medial Lower Leg: Cleanse wound with mild soap and water May Shower, gently pat wound dry prior to applying new dressing. Wound #3 Right,Medial Lower Leg: Cleanse wound with mild soap and water May Shower, gently pat wound dry prior to applying new dressing. Anesthetic (add to Medication List): Wound #1 Right,Proximal,Medial Lower Leg: Topical Lidocaine 4% cream applied to wound bed prior to debridement (In Clinic Only). Wound #2 Right,Distal,Medial Lower Leg: Topical Lidocaine 4% cream applied to wound bed prior to debridement (In Clinic Only). Wound #3 Right,Medial Lower Leg: Topical Lidocaine 4% cream applied to wound bed prior to debridement (In Clinic Only). Primary Wound Dressing: Wound #1 Right,Proximal,Medial Lower Leg: Hydrafera Blue Ready Transfer Wound #2 Right,Distal,Medial Lower Leg: Hydrafera Blue Ready Transfer Wound #3 Right,Medial Lower Leg: Hydrafera Blue Ready Transfer Secondary Dressing: Wound #1 Right,Proximal,Medial Lower Leg: ABD pad Wound #2 Right,Distal,Medial Lower Leg: ABD pad Wound #3 Right,Medial Lower Leg: ABD pad Dressing Change Frequency: Wound #1 Right,Proximal,Medial  Lower Leg: Change dressing every week Wound #2 Right,Distal,Medial Lower Leg: Change dressing every week Wound #  3 Right,Medial Lower Leg: Change dressing every week Follow-up Appointments: Wound #1 Right,Proximal,Medial Lower Leg: Return Appointment in 1 week. Wound #2 Right,Distal,Medial Lower Leg: Return Appointment in 1 week. Wound #3 Right,Medial Lower Leg: Return Appointment in 1 week. Edema Control: Wound #1 Right,Proximal,Medial Lower Leg: 3 Layer Compression System - Right Lower Extremity Elevate legs to the level of the heart and pump ankles as often as possible Kenneth Cline, Kenneth L. ([id number]) Wound #2 Right,Distal,Medial Lower Leg: 3 Layer Compression System - Right Lower Extremity Elevate legs to the level of the heart and pump ankles as often as possible Wound #3 Right,Medial Lower Leg: 3 Layer Compression System - Right Lower Extremity Elevate legs to the level of the heart and pump ankles as often as possible 1. Hydrofera Blue ABDs/3 layer compression. He is going to keep this on a week we will review him next week Electronic Signature(s) Signed: 01/15/2019 5:27:47 PM By: Linton Ham MD Entered By: Linton Ham on 01/15/2019 08:42:48 Kenneth Cline ([id number]) -------------------------------------------------------------------------------- SuperBill Details Patient Name: Kenneth Cline Date of Service: 01/15/2019 Medical Record Number: [id number] Patient Account Number: [id number] Date of Birth/Sex: 1956/01/24 (63 y.o. M) Treating RN: Cornell Barman Primary Care Provider: Jenna Luo Other Clinician: Referring Provider: Jenna Luo Treating Provider/Extender: Tito Dine in Treatment: 5 Diagnosis Coding ICD-10 Codes Code Description [phone] Non-pressure chronic ulcer of other part of right lower leg with other specified severity T81.31XA Disruption of external operation (surgical) wound, not elsewhere classified, initial encounter E11.622  Type 2 diabetes mellitus with other skin ulcer Facility Procedures CPT4 Code Description: [id number] - DEBRIDE WOUND 1ST 20 SQ CM OR < ICD-10 Diagnosis Description L97.818 Non-pressure chronic ulcer of other part of right lower leg with o Modifier: ther specified s Quantity: 1 everity Physician Procedures CPT4 Code Description: [id number] - WC PHYS DEBR WO ANESTH 20 SQ CM ICD-10 Diagnosis Description L97.818 Non-pressure chronic ulcer of other part of right lower leg with o Modifier: ther specified s Quantity: 1 everity Electronic Signature(s) Signed: 01/15/2019 5:27:47 PM By: Linton Ham MD Entered By: Linton Ham on 01/15/2019 08:43:13

## 2019-01-16 NOTE — Progress Notes (Signed)
SIVAN, PAYAMPS ([id number]) Visit Report for 01/15/2019 Arrival Information Details Patient Name: Kenneth Cline, Kenneth Cline Date of Service: 01/15/2019 8:30 AM Medical Record Number: [id number] Patient Account Number: [id number] Date of Birth/Sex: 02-Jun-1955 (63 y.o. M) Treating RN: Army Melia Primary Care Gunda Maqueda: Jenna Luo Other Clinician: Referring Jalayla Chrismer: Jenna Luo Treating Amaury Kuzel/Extender: Tito Dine in Treatment: 5 Visit Information History Since Last Visit Added or deleted any medications: No Patient Arrived: Ambulatory Any new allergies or adverse reactions: No Arrival Time: 08:24 Had a fall or experienced change in No Accompanied By: self activities of daily living that may affect Transfer Assistance: None risk of falls: Patient Identification Verified: Yes Signs or symptoms of abuse/neglect since last visito No Patient Has Alerts: Yes Hospitalized since last visit: No Patient Alerts: Patient on Blood Thinner Has Dressing in Place as Prescribed: Yes Warfarin Pain Present Now: No ABI L 1.2 R 1.27 Electronic Signature(s) Signed: 01/15/2019 10:47:16 AM By: Army Melia Entered By: Army Melia on 01/15/2019 08:24:21 Kenneth Cline ([id number]) -------------------------------------------------------------------------------- Encounter Discharge Information Details Patient Name: Kenneth Cline Date of Service: 01/15/2019 8:30 AM Medical Record Number: [id number] Patient Account Number: [id number] Date of Birth/Sex: [birth year] (63 y.o. M) Treating RN: Cornell Barman Primary Care Bree Heinzelman: Jenna Luo Other Clinician: Referring Birdie Beveridge: Jenna Luo Treating Lakelynn Severtson/Extender: Tito Dine in Treatment: 5 Encounter Discharge Information Items Post Procedure Vitals Discharge Condition: Stable Temperature (F): 99.1 Ambulatory Status: Ambulatory Pulse (bpm): 49 Discharge Destination: Home Respiratory Rate (breaths/min):  16 Transportation: Private Auto Blood Pressure (mmHg): 141/85 Accompanied By: self Schedule Follow-up Appointment: Yes Clinical Summary of Care: Electronic Signature(s) Signed: 01/15/2019 5:53:54 PM By: Gretta Cool, BSN, RN, CWS, Kim RN, BSN Entered By: Gretta Cool, BSN, RN, CWS, Kim on 01/15/2019 08:40:58 Kenneth Cline ([id number]) -------------------------------------------------------------------------------- Lower Extremity Assessment Details Patient Name: Kenneth Cline Date of Service: 01/15/2019 8:30 AM Medical Record Number: [id number] Patient Account Number: [id number] Date of Birth/Sex: 09-17-1955 (63 y.o. M) Treating RN: Army Melia Primary Care Bilaal Leib: Jenna Luo Other Clinician: Referring Zaire Vanbuskirk: Jenna Luo Treating Felicha Frayne/Extender: Ricard Dillon Weeks in Treatment: 5 Edema Assessment Assessed: [Left: No] [Right: No] Edema: [Left: N] [Right: o] Vascular Assessment Pulses: Dorsalis Pedis Palpable: [Right:Yes] Electronic Signature(s) Signed: 01/15/2019 10:47:16 AM By: Army Melia Entered By: Army Melia on 01/15/2019 08:30:50 Maggard, [name] ([id number]) -------------------------------------------------------------------------------- Multi Wound Chart Details Patient Name: Kenneth Cline Date of Service: 01/15/2019 8:30 AM Medical Record Number: [id number] Patient Account Number: [id number] Date of Birth/Sex: [birth year] (63 y.o. M) Treating RN: Cornell Barman Primary Care Luiza Carranco: Jenna Luo Other Clinician: Referring Muriah Harsha: Jenna Luo Treating Kayani Rapaport/Extender: Tito Dine in Treatment: 5 Vital Signs Height(in): 69 Pulse(bpm): 99 Weight(lbs): 183 Blood Pressure(mmHg): 141/85 Body Mass Index(BMI): 27 Temperature(F): 99.1 Respiratory Rate 16 (breaths/min): Photos: Wound Location: Right Lower Leg - Medial, Right Lower Leg - Medial, Right Lower Leg - Medial Proximal Distal Wounding Event: Gradually Appeared Gradually  Appeared Surgical Injury Primary Etiology: Cellulitis Cellulitis Open Surgical Wound Comorbid History: Type II Diabetes Type II Diabetes Type II Diabetes Date Acquired: 10/07/2018 10/07/2018 10/12/2018 Weeks of Treatment: 5 5 5  Wound Status: Open Open Open Measurements L x W x D 1x0.5x0.1 1.7x0.9x0.1 1x0.8x0.1 (cm) Area (cm) : 0.393 1.202 0.628 Volume (cm) : 0.039 0.12 0.063 % Reduction in Area: 44.40% 86.10% 85.50% % Reduction in Volume: 81.60% 96.50% 85.40% Classification: Full Thickness Without Full Thickness Without Full Thickness Without Exposed Support Structures Exposed Support Structures Exposed Support Structures Exudate Amount: None Present Medium None Present Exudate Type:  [name] Serous [name] Exudate Color: [name] amber [name] Wound Margin: Flat and Intact Flat and Intact Flat and Intact Granulation Amount: None Present (0%) Large (67-100%) None Present (0%) Granulation Quality: [name] Red [name] Necrotic Amount: Large (67-100%) Small (1-33%) Large (67-100%) Necrotic Tissue: Eschar Adherent Slough Eschar Exposed Structures: Fascia: No Fat Layer (Subcutaneous Fascia: No Fat Layer (Subcutaneous Tissue) Exposed: Yes Fat Layer (Subcutaneous Tissue) Exposed: No Fascia: No Tissue) Exposed: No Tendon: No Tendon: No Tendon: No Muscle: No Muscle: No Muscle: No Ozment, Bronte L. ([id number]) Joint: No Joint: No Joint: No Bone: No Bone: No Bone: No Limited to Skin Breakdown Limited to Skin Breakdown Epithelialization: Large (67-100%) None Large (67-100%) Debridement: [name] [name] Debridement - Selective/Open Wound Pre-procedure [name] [name] 08:35 Verification/Time Out Taken: Pain Control: [name] [name] Lidocaine Tissue Debrided: [name] [name] Necrotic/Eschar Level: [name] [name] Skin/Epidermis Debridement Area (sq cm): [name] [name] 0.8 Instrument: [name] [name] Curette Bleeding: [name] [name] Minimum Hemostasis Achieved: [name] [name] Pressure Debridement Treatment [name] [name] Procedure was tolerated well Response: Post Debridement  [name] [name] 1x0.8x0.1 Measurements L x W x D (cm) Post Debridement Volume: [name] [name] 0.063 (cm) Procedures Performed: [name] [name] Debridement Treatment Notes Electronic Signature(s) Signed: 01/15/2019 5:27:47 PM By: Linton Ham MD Entered By: Linton Ham on 01/15/2019 08:40:01 Abid, [name] ([id number]) -------------------------------------------------------------------------------- Multi-Disciplinary Care Plan Details Patient Name: Kenneth Cline Date of Service: 01/15/2019 8:30 AM Medical Record Number: [id number] Patient Account Number: [id number] Date of Birth/Sex: [birth year] (63 y.o. M) Treating RN: Cornell Barman Primary Care Jillianna Stanek: Jenna Luo Other Clinician: Referring Darvis Croft: Jenna Luo Treating Morrill Bomkamp/Extender: Tito Dine in Treatment: 5 Active Inactive Medication Nursing Diagnoses: Knowledge deficit related to medication safety: actual or potential Goals: Patient/caregiver will demonstrate understanding of all current medications Date Initiated: 12/11/2018 Target Resolution Date: 12/11/2018 Goal Status: Active Interventions: Assess for medication contraindications each visit where new medications are prescribed Treatment Activities: New medication prescribed at [facility] : 12/11/2018 Notes: Necrotic Tissue Nursing Diagnoses: Impaired tissue integrity related to necrotic/devitalized tissue Goals: Necrotic/devitalized tissue will be minimized in the wound bed Date Initiated: 12/11/2018 Target Resolution Date: 12/10/2018 Goal Status: Active Interventions: Assess patient pain level pre-, during and post procedure and prior to discharge Treatment Activities: Excisional debridement : 12/11/2018 Notes: Soft Tissue Infection Nursing Diagnoses: Impaired tissue integrity Potential for infection: soft tissue Stai, Sevon L. ([id number]) Goals: Patient will remain free of wound infection Date Initiated: 12/11/2018 Target Resolution Date:  12/18/2018 Goal Status: Active Interventions: Assess signs and symptoms of infection every visit Notes: Wound/Skin Impairment Nursing Diagnoses: Impaired tissue integrity Goals: Patient/caregiver will verbalize understanding of skin care regimen Date Initiated: 12/11/2018 Target Resolution Date: 12/18/2018 Goal Status: Active Ulcer/skin breakdown will have a volume reduction of 30% by week 4 Date Initiated: 12/11/2018 Target Resolution Date: 01/11/2019 Goal Status: Active Interventions: Assess ulceration(s) every visit Treatment Activities: Referred to DME Dhairya [name] for dressing supplies : 12/11/2018 Skin care regimen initiated : 12/11/2018 Notes: Electronic Signature(s) Signed: 01/15/2019 5:53:54 PM By: Gretta Cool, BSN, RN, CWS, Kim RN, BSN Entered By: Gretta Cool, BSN, RN, CWS, Kim on 01/15/2019 08:34:58 Kenneth Cline ([id number]) -------------------------------------------------------------------------------- Pain Assessment Details Patient Name: Kenneth Cline Date of Service: 01/15/2019 8:30 AM Medical Record Number: [id number] Patient Account Number: [id number] Date of Birth/Sex: 02/24/1956 (63 y.o. M) Treating RN: Army Melia Primary Care Jayko Voorhees: Jenna Luo Other Clinician: Referring Maddon Horton: Jenna Luo Treating Amarissa Koerner/Extender: Tito Dine in Treatment: 5 Active Problems Location of Pain Severity and Description of Pain Patient Has Paino No Site Locations Pain  Management and Medication Current Pain Management: Electronic Signature(s) Signed: 01/15/2019 10:47:16 AM By: Army Melia Entered By: Army Melia on 01/15/2019 08:24:27 Kenneth Cline ([id number]) -------------------------------------------------------------------------------- Patient/Caregiver Education Details Patient Name: Kenneth Cline Date of Service: 01/15/2019 8:30 AM Medical Record Number: [id number] Patient Account Number: [id number] Date of Birth/Gender: 05-Nov-1955 (63 y.o.  M) Treating RN: Cornell Barman Primary Care Physician: Jenna Luo Other Clinician: Referring Physician: Jenna Luo Treating Physician/Extender: Tito Dine in Treatment: 5 Education Assessment Education Provided To: Patient Education Topics Provided Wound/Skin Impairment: Handouts: Caring for Your Ulcer Methods: Demonstration, Explain/Verbal Responses: State content correctly Electronic Signature(s) Signed: 01/15/2019 5:53:54 PM By: Gretta Cool, BSN, RN, CWS, Kim RN, BSN Entered By: Gretta Cool, BSN, RN, CWS, Kim on 01/15/2019 08:40:04 Kenneth Cline ([id number]) -------------------------------------------------------------------------------- Wound Assessment Details Patient Name: Kenneth Cline Date of Service: 01/15/2019 8:30 AM Medical Record Number: [id number] Patient Account Number: [id number] Date of Birth/Sex: 1955/05/23 (63 y.o. M) Treating RN: Army Melia Primary Care Racquelle Hyser: Jenna Luo Other Clinician: Referring Brynlyn Dade: Jenna Luo Treating Traniyah Hallett/Extender: Tito Dine in Treatment: 5 Wound Status Wound Number: 1 Primary Etiology: Cellulitis Wound Location: Right Lower Leg - Medial, Proximal Wound Status: Open Wounding Event: Gradually Appeared Comorbid History: Type II Diabetes Date Acquired: 10/07/2018 Weeks Of Treatment: 5 Clustered Wound: No Photos Wound Measurements Length: (cm) 1 Width: (cm) 0.5 Depth: (cm) 0.1 Area: (cm) 0.393 Volume: (cm) 0.039 % Reduction in Area: 44.4% % Reduction in Volume: 81.6% Epithelialization: Large (67-100%) Tunneling: No Undermining: No Wound Description Full Thickness Without Exposed Support Classification: Structures Wound Margin: Flat and Intact Exudate None Present Amount: Foul Odor After Cleansing: No Slough/Fibrino No Wound Bed Granulation Amount: None Present (0%) Exposed Structure Necrotic Amount: Large (67-100%) Fascia Exposed: No Necrotic Quality: Eschar Fat Layer  (Subcutaneous Tissue) Exposed: No Tendon Exposed: No Muscle Exposed: No Joint Exposed: No Bone Exposed: No Limited to Skin Breakdown Treatment Notes Shaw, Hridaan L. ([id number]) Wound #1 (Right, Proximal, Medial Lower Leg) Notes Hydrofera Blue, ABD, 3 layer, unna paste. Electronic Signature(s) Signed: 01/15/2019 10:47:16 AM By: Army Melia Entered By: Army Melia on 01/15/2019 08:29:13 Kenneth Cline ([id number]) -------------------------------------------------------------------------------- Wound Assessment Details Patient Name: Kenneth Cline Date of Service: 01/15/2019 8:30 AM Medical Record Number: [id number] Patient Account Number: [id number] Date of Birth/Sex: 1955-05-27 (63 y.o. M) Treating RN: Army Melia Primary Care Callie Bunyard: Jenna Luo Other Clinician: Referring Maryah Marinaro: Jenna Luo Treating Jionni Helming/Extender: Tito Dine in Treatment: 5 Wound Status Wound Number: 2 Primary Etiology: Cellulitis Wound Location: Right Lower Leg - Medial, Distal Wound Status: Open Wounding Event: Gradually Appeared Comorbid History: Type II Diabetes Date Acquired: 10/07/2018 Weeks Of Treatment: 5 Clustered Wound: No Photos Wound Measurements Length: (cm) 1.7 Width: (cm) 0.9 Depth: (cm) 0.1 Area: (cm) 1.202 Volume: (cm) 0.12 % Reduction in Area: 86.1% % Reduction in Volume: 96.5% Epithelialization: None Tunneling: No Undermining: No Wound Description Full Thickness Without Exposed Support Classification: Structures Wound Margin: Flat and Intact Exudate Medium Amount: Exudate Type: Serous Exudate Color: amber Foul Odor After Cleansing: No Slough/Fibrino Yes Wound Bed Granulation Amount: Large (67-100%) Exposed Structure Granulation Quality: Red Fascia Exposed: No Necrotic Amount: Small (1-33%) Fat Layer (Subcutaneous Tissue) Exposed: Yes Necrotic Quality: Adherent Slough Tendon Exposed: No Muscle Exposed: No Joint Exposed: No Bone  Exposed: No Sherrin, Skippy L. ([id number]) Treatment Notes Wound #2 (Right, Distal, Medial Lower Leg) Notes Hydrofera Blue, ABD, 3 layer, unna paste. Electronic Signature(s) Signed: 01/15/2019 10:47:16 AM By: Army Melia Entered By: Army Melia on 01/15/2019  08:29:52 KANYA, TWYMAN ([id number]) -------------------------------------------------------------------------------- Wound Assessment Details Patient Name: ARLYNN, VEASLEY Date of Service: 01/15/2019 8:30 AM Medical Record Number: [id number] Patient Account Number: [id number] Date of Birth/Sex: 1955/10/23 (63 y.o. M) Treating RN: Army Melia Primary Care Sharyah Bostwick: Jenna Luo Other Clinician: Referring Deshay Kirstein: Jenna Luo Treating Sheniah Supak/Extender: Tito Dine in Treatment: 5 Wound Status Wound Number: 3 Primary Etiology: Open Surgical Wound Wound Location: Right Lower Leg - Medial Wound Status: Open Wounding Event: Surgical Injury Comorbid History: Type II Diabetes Date Acquired: 10/12/2018 Weeks Of Treatment: 5 Clustered Wound: No Photos Wound Measurements Length: (cm) 1 Width: (cm) 0.8 Depth: (cm) 0.1 Area: (cm) 0.628 Volume: (cm) 0.063 % Reduction in Area: 85.5% % Reduction in Volume: 85.4% Epithelialization: Large (67-100%) Tunneling: No Undermining: No Wound Description Full Thickness Without Exposed Support Classification: Structures Wound Margin: Flat and Intact Exudate None Present Amount: Foul Odor After Cleansing: No Slough/Fibrino No Wound Bed Granulation Amount: None Present (0%) Exposed Structure Necrotic Amount: Large (67-100%) Fascia Exposed: No Necrotic Quality: Eschar Fat Layer (Subcutaneous Tissue) Exposed: No Tendon Exposed: No Muscle Exposed: No Joint Exposed: No Bone Exposed: No Limited to Skin Breakdown Treatment Notes Pizzo, Kerin L. ([id number]) Wound #3 (Right, Medial Lower Leg) Notes Hydrofera Blue, ABD, 3 layer, unna paste. Electronic  Signature(s) Signed: 01/15/2019 10:47:16 AM By: Army Melia Entered By: Army Melia on 01/15/2019 08:30:19 Kenneth Cline ([id number]) -------------------------------------------------------------------------------- Vitals Details Patient Name: Kenneth Cline Date of Service: 01/15/2019 8:30 AM Medical Record Number: [id number] Patient Account Number: [id number] Date of Birth/Sex: 04/05/1956 (63 y.o. M) Treating RN: Army Melia Primary Care Azzan Butler: Jenna Luo Other Clinician: Referring Tenessa Marsee: Jenna Luo Treating Lathon Adan/Extender: Tito Dine in Treatment: 5 Vital Signs Time Taken: 08:24 Temperature (F): 99.1 Height (in): 69 Pulse (bpm): 99 Weight (lbs): 183 Respiratory Rate (breaths/min): 16 Body Mass Index (BMI): 27 Blood Pressure (mmHg): 141/85 Reference Range: 80 - 120 mg / dl Electronic Signature(s) Signed: 01/15/2019 10:47:16 AM By: Army Melia Entered By: Army Melia on 01/15/2019 08:25:07

## 2019-01-22 ENCOUNTER — Other Ambulatory Visit: Payer: Self-pay

## 2019-01-22 ENCOUNTER — Encounter: Payer: Medicare HMO | Admitting: Internal Medicine

## 2019-01-22 DIAGNOSIS — L97819 Non-pressure chronic ulcer of other part of right lower leg with unspecified severity: Secondary | ICD-10-CM | POA: Diagnosis not present

## 2019-01-22 DIAGNOSIS — T8189XA Other complications of procedures, not elsewhere classified, initial encounter: Secondary | ICD-10-CM | POA: Diagnosis not present

## 2019-01-22 DIAGNOSIS — Z7901 Long term (current) use of anticoagulants: Secondary | ICD-10-CM | POA: Diagnosis not present

## 2019-01-22 DIAGNOSIS — L03115 Cellulitis of right lower limb: Secondary | ICD-10-CM | POA: Diagnosis not present

## 2019-01-22 DIAGNOSIS — D6862 Lupus anticoagulant syndrome: Secondary | ICD-10-CM | POA: Diagnosis not present

## 2019-01-22 DIAGNOSIS — E039 Hypothyroidism, unspecified: Secondary | ICD-10-CM | POA: Diagnosis not present

## 2019-01-22 DIAGNOSIS — E2740 Unspecified adrenocortical insufficiency: Secondary | ICD-10-CM | POA: Diagnosis not present

## 2019-01-22 DIAGNOSIS — Z86718 Personal history of other venous thrombosis and embolism: Secondary | ICD-10-CM | POA: Diagnosis not present

## 2019-01-22 DIAGNOSIS — I10 Essential (primary) hypertension: Secondary | ICD-10-CM | POA: Diagnosis not present

## 2019-01-22 DIAGNOSIS — S81801A Unspecified open wound, right lower leg, initial encounter: Secondary | ICD-10-CM | POA: Diagnosis not present

## 2019-01-22 DIAGNOSIS — E11622 Type 2 diabetes mellitus with other skin ulcer: Secondary | ICD-10-CM | POA: Diagnosis not present

## 2019-01-22 NOTE — Progress Notes (Signed)
WAGNER, KEOMANY ([id number]) Visit Report for 01/22/2019 Arrival Information Details Patient Name: Kenneth Cline, Kenneth Cline Date of Service: 01/22/2019 8:30 AM Medical Record Number: [id number] Patient Account Number: [id number] Date of Birth/Sex: 11-23-1955 (63 y.o. M) Treating RN: Harold Barban Primary Care Lorrine Killilea: Jenna Luo Other Clinician: Referring Evander Macaraeg: Jenna Luo Treating Angelina Venard/Extender: Tito Dine in Treatment: 6 Visit Information History Since Last Visit Added or deleted any medications: No Patient Arrived: Ambulatory Any new allergies or adverse reactions: No Arrival Time: 08:28 Had a fall or experienced change in No Accompanied By: self activities of daily living that may affect Transfer Assistance: None risk of falls: Patient Identification Verified: Yes Signs or symptoms of abuse/neglect since last visito No Secondary Verification Process Yes Hospitalized since last visit: No Completed: Has Dressing in Place as Prescribed: Yes Patient Has Alerts: Yes Pain Present Now: No Patient Alerts: Patient on Blood Thinner Warfarin ABI L 1.2 R 1.27 Electronic Signature(s) Signed: 01/22/2019 4:26:58 PM By: Harold Barban Entered By: Harold Barban on 01/22/2019 08:29:44 Kenneth Cline ([id number]) -------------------------------------------------------------------------------- Compression Therapy Details Patient Name: Kenneth Cline Date of Service: 01/22/2019 8:30 AM Medical Record Number: [id number] Patient Account Number: [id number] Date of Birth/Sex: 01-10-1956 (63 y.o. M) Treating RN: Cornell Barman Primary Care Terrelle Ruffolo: Jenna Luo Other Clinician: Referring Atisha Hamidi: Jenna Luo Treating Maurica Omura/Extender: Tito Dine in Treatment: 6 Compression Therapy Performed for Wound Assessment: Wound #2 Right,Distal,Medial Lower Leg Performed By: Clinician Cornell Barman, RN Compression Type: Three Layer Post Procedure Diagnosis Same  as Pre-procedure Electronic Signature(s) Signed: 01/22/2019 5:01:16 PM By: Gretta Cool, BSN, RN, CWS, Kim RN, BSN Entered By: Gretta Cool, BSN, RN, CWS, Kim on 01/22/2019 08:45:14 Kenneth Cline ([id number]) -------------------------------------------------------------------------------- Encounter Discharge Information Details Patient Name: Kenneth Cline Date of Service: 01/22/2019 8:30 AM Medical Record Number: [id number] Patient Account Number: [id number] Date of Birth/Sex: [birth year] (63 y.o. M) Treating RN: Cornell Barman Primary Care Eural Holzschuh: Jenna Luo Other Clinician: Referring Krisi Azua: Jenna Luo Treating Tionna Gigante/Extender: Tito Dine in Treatment: 6 Encounter Discharge Information Items Discharge Condition: Stable Ambulatory Status: Ambulatory Discharge Destination: Home Transportation: Private Auto Accompanied By: self Schedule Follow-up Appointment: Yes Clinical Summary of Care: Electronic Signature(s) Signed: 01/22/2019 5:01:16 PM By: Gretta Cool, BSN, RN, CWS, Kim RN, BSN Entered By: Gretta Cool, BSN, RN, CWS, Kim on 01/22/2019 08:46:14 Kenneth Cline ([id number]) -------------------------------------------------------------------------------- Lower Extremity Assessment Details Patient Name: Kenneth Cline Date of Service: 01/22/2019 8:30 AM Medical Record Number: [id number] Patient Account Number: [id number] Date of Birth/Sex: 1955-06-16 (63 y.o. M) Treating RN: Harold Barban Primary Care Carmino Ocain: Jenna Luo Other Clinician: Referring Rhiley Solem: Jenna Luo Treating Otelia Hettinger/Extender: Ricard Dillon Weeks in Treatment: 6 Edema Assessment Assessed: [Left: No] [Right: No] Edema: [Left: N] [Right: o] Vascular Assessment Pulses: Dorsalis Pedis Palpable: [Right:Yes] Electronic Signature(s) Signed: 01/22/2019 4:26:58 PM By: Harold Barban Entered By: Harold Barban on 01/22/2019 08:36:29 [name], [name]  ([id number]) -------------------------------------------------------------------------------- Multi Wound Chart Details Patient Name: Kenneth Cline Date of Service: 01/22/2019 8:30 AM Medical Record Number: [id number] Patient Account Number: [id number] Date of Birth/Sex: 04-26-1955 (63 y.o. M) Treating RN: Cornell Barman Primary Care Jaylnn Ullery: Jenna Luo Other Clinician: Referring Matty Deamer: Jenna Luo Treating Oree Hislop/Extender: Tito Dine in Treatment: 6 Vital Signs Height(in): 69 Pulse(bpm): 87 Weight(lbs): 183 Blood Pressure(mmHg): 151/91 Body Mass Index(BMI): 27 Temperature(F): 98.3 Respiratory Rate 16 (breaths/min): Photos: Wound Location: Right, Proximal, Medial Lower Right Lower Leg - Medial, Right, Medial Lower Leg Leg Distal Wounding Event: Gradually Appeared Gradually Appeared Surgical Injury Primary Etiology: Cellulitis  Cellulitis Open Surgical Wound Comorbid History: Type II Diabetes Type II Diabetes Type II Diabetes Date Acquired: 10/07/2018 10/07/2018 10/12/2018 Weeks of Treatment: 6 6 6  Wound Status: Healed - Epithelialized Open Healed - Epithelialized Measurements L x W x D 0x0x0 2.4x1.3x0.1 0x0x0 (cm) Area (cm) : 0 2.45 0 Volume (cm) : 0 0.245 0 % Reduction in Area: 100.00% 71.60% 100.00% % Reduction in Volume: 100.00% 92.90% 100.00% Classification: Full Thickness Without Full Thickness Without Full Thickness Without Exposed Support Structures Exposed Support Structures Exposed Support Structures Exudate Amount: None Present Medium None Present Exudate Type: [name] Serous [name] Exudate Color: [name] amber [name] Wound Margin: Flat and Intact Flat and Intact Flat and Intact Granulation Amount: None Present (0%) Large (67-100%) None Present (0%) Granulation Quality: [name] Red [name] Necrotic Amount: Large (67-100%) Small (1-33%) Large (67-100%) Necrotic Tissue: Eschar Adherent Slough Eschar Exposed Structures: Fascia: No Fat Layer  (Subcutaneous Fascia: No Fat Layer (Subcutaneous Tissue) Exposed: Yes Fat Layer (Subcutaneous Tissue) Exposed: No Fascia: No Tissue) Exposed: No Tendon: No Tendon: No Tendon: No Muscle: No Muscle: No Muscle: No [name], [name] ([id number]) Joint: No Joint: No Joint: No Bone: No Bone: No Bone: No Limited to Skin Breakdown Limited to Skin Breakdown Epithelialization: Large (67-100%) None Large (67-100%) Procedures Performed: [name] Compression Therapy [name] Treatment Notes Wound #2 (Right, Distal, Medial Lower Leg) Notes Hydrofera Blue, ABD, 3 layer, unna paste. Electronic Signature(s) Signed: 01/22/2019 4:51:07 PM By: Linton Ham MD Entered By: Linton Ham on 01/22/2019 08:46:12 Kenneth Cline ([id number]) -------------------------------------------------------------------------------- Multi-Disciplinary Care Plan Details Patient Name: Kenneth Cline Date of Service: 01/22/2019 8:30 AM Medical Record Number: [id number] Patient Account Number: [id number] Date of Birth/Sex: 1955-11-24 (63 y.o. M) Treating RN: Cornell Barman Primary Care Xandra Laramee: Jenna Luo Other Clinician: Referring Elva Mauro: Jenna Luo Treating Leobardo Granlund/Extender: Tito Dine in Treatment: 6 Active Inactive Medication Nursing Diagnoses: Knowledge deficit related to medication safety: actual or potential Goals: Patient/caregiver will demonstrate understanding of all current medications Date Initiated: 12/11/2018 Target Resolution Date: 12/11/2018 Goal Status: Active Interventions: Assess for medication contraindications each visit where new medications are prescribed Treatment Activities: New medication prescribed at [facility] : 12/11/2018 Notes: Necrotic Tissue Nursing Diagnoses: Impaired tissue integrity related to necrotic/devitalized tissue Goals: Necrotic/devitalized tissue will be minimized in the wound bed Date Initiated: 12/11/2018 Target Resolution Date:  12/10/2018 Goal Status: Active Interventions: Assess patient pain level pre-, during and post procedure and prior to discharge Treatment Activities: Excisional debridement : 12/11/2018 Notes: Soft Tissue Infection Nursing Diagnoses: Impaired tissue integrity Potential for infection: soft tissue Ferrucci, Tyaire L. ([id number]) Goals: Patient will remain free of wound infection Date Initiated: 12/11/2018 Target Resolution Date: 12/18/2018 Goal Status: Active Interventions: Assess signs and symptoms of infection every visit Notes: Wound/Skin Impairment Nursing Diagnoses: Impaired tissue integrity Goals: Patient/caregiver will verbalize understanding of skin care regimen Date Initiated: 12/11/2018 Target Resolution Date: 12/18/2018 Goal Status: Active Ulcer/skin breakdown will have a volume reduction of 30% by week 4 Date Initiated: 12/11/2018 Target Resolution Date: 01/11/2019 Goal Status: Active Interventions: Assess ulceration(s) every visit Treatment Activities: Referred to DME Janal Haak for dressing supplies : 12/11/2018 Skin care regimen initiated : 12/11/2018 Notes: Electronic Signature(s) Signed: 01/22/2019 5:01:16 PM By: Gretta Cool, BSN, RN, CWS, Kim RN, BSN Entered By: Gretta Cool, BSN, RN, CWS, Kim on 01/22/2019 08:44:05 Kenneth Cline ([id number]) -------------------------------------------------------------------------------- Pain Assessment Details Patient Name: Kenneth Cline Date of Service: 01/22/2019 8:30 AM Medical Record Number: [id number] Patient Account Number: [id number] Date of Birth/Sex: 01/02/1956 (63 y.o. M) Treating RN:  Harold Barban Primary Care Kandiss Ihrig: Jenna Luo Other Clinician: Referring Mareon Robinette: Jenna Luo Treating Kaydi Kley/Extender: Tito Dine in Treatment: 6 Active Problems Location of Pain Severity and Description of Pain Patient Has Paino No Site Locations Pain Management and Medication Current Pain Management: Electronic  Signature(s) Signed: 01/22/2019 4:26:58 PM By: Harold Barban Entered By: Harold Barban on 01/22/2019 08:29:52 Kenneth Cline ([id number]) -------------------------------------------------------------------------------- Patient/Caregiver Education Details Patient Name: Kenneth Cline Date of Service: 01/22/2019 8:30 AM Medical Record Number: [id number] Patient Account Number: [id number] Date of Birth/Gender: [birth year] (63 y.o. M) Treating RN: Cornell Barman Primary Care Physician: Jenna Luo Other Clinician: Referring Physician: Jenna Luo Treating Physician/Extender: Tito Dine in Treatment: 6 Education Assessment Education Provided To: Patient Education Topics Provided Venous: Handouts: Controlling Swelling with Multilayered Compression Wraps Methods: Demonstration, Explain/Verbal Responses: State content correctly Electronic Signature(s) Signed: 01/22/2019 5:01:16 PM By: Gretta Cool, BSN, RN, CWS, Kim RN, BSN Entered By: Gretta Cool, BSN, RN, CWS, Kim on 01/22/2019 08:45:45 Kenneth Cline ([id number]) -------------------------------------------------------------------------------- Wound Assessment Details Patient Name: Kenneth Cline Date of Service: 01/22/2019 8:30 AM Medical Record Number: [id number] Patient Account Number: [id number] Date of Birth/Sex: 05-30-1955 (63 y.o. M) Treating RN: Cornell Barman Primary Care Keymora Grillot: Jenna Luo Other Clinician: Referring Nalani Andreen: Jenna Luo Treating Yoali Conry/Extender: Tito Dine in Treatment: 6 Wound Status Wound Number: 1 Primary Etiology: Cellulitis Wound Location: Right, Proximal, Medial Lower Leg Wound Status: Healed - Epithelialized Wounding Event: Gradually Appeared Comorbid History: Type II Diabetes Date Acquired: 10/07/2018 Weeks Of Treatment: 6 Clustered Wound: No Photos Wound Measurements Length: (cm) 0 % Width: (cm) 0 % Depth: (cm) 0 Ep Area: (cm) 0 T Volume: (cm) 0 U Reduction  in Area: 100% Reduction in Volume: 100% ithelialization: Large (67-100%) unneling: No ndermining: No Wound Description Full Thickness Without Exposed Support Classification: Structures Wound Margin: Flat and Intact Exudate None Present Amount: Foul Odor After Cleansing: No Slough/Fibrino No Wound Bed Granulation Amount: None Present (0%) Exposed Structure Necrotic Amount: Large (67-100%) Fascia Exposed: No Necrotic Quality: Eschar Fat Layer (Subcutaneous Tissue) Exposed: No Tendon Exposed: No Muscle Exposed: No Joint Exposed: No Bone Exposed: No Limited to Skin Breakdown Electronic Signature(s) JERAULD, DOMAGALSKI ([id number]) Signed: 01/22/2019 5:01:16 PM By: Gretta Cool, BSN, RN, CWS, Kim RN, BSN Entered By: Gretta Cool, BSN, RN, CWS, Kim on 01/22/2019 08:43:21 Kenneth Cline ([id number]) -------------------------------------------------------------------------------- Wound Assessment Details Patient Name: Kenneth Cline Date of Service: 01/22/2019 8:30 AM Medical Record Number: [id number] Patient Account Number: [id number] Date of Birth/Sex: 11-30-1955 (63 y.o. M) Treating RN: Harold Barban Primary Care Shyam Dawson: Jenna Luo Other Clinician: Referring Faraaz Wolin: Jenna Luo Treating Adryen Cookson/Extender: Tito Dine in Treatment: 6 Wound Status Wound Number: 2 Primary Etiology: Cellulitis Wound Location: Right Lower Leg - Medial, Distal Wound Status: Open Wounding Event: Gradually Appeared Comorbid History: Type II Diabetes Date Acquired: 10/07/2018 Weeks Of Treatment: 6 Clustered Wound: No Photos Wound Measurements Length: (cm) 2.4 Width: (cm) 1.3 Depth: (cm) 0.1 Area: (cm) 2.45 Volume: (cm) 0.245 % Reduction in Area: 71.6% % Reduction in Volume: 92.9% Epithelialization: None Tunneling: No Undermining: No Wound Description Full Thickness Without Exposed Support Classification: Structures Wound Margin: Flat and  Intact Exudate Medium Amount: Exudate Type: Serous Exudate Color: amber Foul Odor After Cleansing: No Slough/Fibrino Yes Wound Bed Granulation Amount: Large (67-100%) Exposed Structure Granulation Quality: Red Fascia Exposed: No Necrotic Amount: Small (1-33%) Fat Layer (Subcutaneous Tissue) Exposed: Yes Necrotic Quality: Adherent Slough Tendon Exposed: No Muscle Exposed: No Joint Exposed: No Bone Exposed: No Difabio,  Favor L. ([id number]) Treatment Notes Wound #2 (Right, Distal, Medial Lower Leg) Notes Hydrofera Blue, ABD, 3 layer, unna paste. Electronic Signature(s) Signed: 01/22/2019 4:26:58 PM By: Harold Barban Entered By: Harold Barban on 01/22/2019 08:35:44 Kenneth Cline ([id number]) -------------------------------------------------------------------------------- Wound Assessment Details Patient Name: Kenneth Cline Date of Service: 01/22/2019 8:30 AM Medical Record Number: [id number] Patient Account Number: [id number] Date of Birth/Sex: [birth year] (63 y.o. M) Treating RN: Cornell Barman Primary Care Juanantonio Stolar: Jenna Luo Other Clinician: Referring Kenn Rekowski: Jenna Luo Treating Fabianna Keats/Extender: Tito Dine in Treatment: 6 Wound Status Wound Number: 3 Primary Etiology: Open Surgical Wound Wound Location: Right, Medial Lower Leg Wound Status: Healed - Epithelialized Wounding Event: Surgical Injury Comorbid History: Type II Diabetes Date Acquired: 10/12/2018 Weeks Of Treatment: 6 Clustered Wound: No Photos Wound Measurements Length: (cm) 0 Width: (cm) 0 Depth: (cm) 0 Area: (cm) 0 Volume: (cm) 0 % Reduction in Area: 100% % Reduction in Volume: 100% Epithelialization: Large (67-100%) Tunneling: No Undermining: No Wound Description Full Thickness Without Exposed Support Foul O Classification: Structures Slough Wound Margin: Flat and Intact Exudate None Present Amount: dor After Cleansing: No /Fibrino No Wound Bed Granulation  Amount: None Present (0%) Exposed Structure Necrotic Amount: Large (67-100%) Fascia Exposed: No Necrotic Quality: Eschar Fat Layer (Subcutaneous Tissue) Exposed: No Tendon Exposed: No Muscle Exposed: No Joint Exposed: No Bone Exposed: No Limited to Skin Breakdown Electronic Signature(s) KHAEL, LINO ([id number]) Signed: 01/22/2019 5:01:16 PM By: Gretta Cool, BSN, RN, CWS, Kim RN, BSN Entered By: Gretta Cool, BSN, RN, CWS, Kim on 01/22/2019 08:43:22 Kenneth Cline ([id number]) -------------------------------------------------------------------------------- [place] Details Patient Name: Kenneth Cline Date of Service: 01/22/2019 8:30 AM Medical Record Number: [id number] Patient Account Number: [id number] Date of Birth/Sex: 12/02/1955 (63 y.o. M) Treating RN: Harold Barban Primary Care Jaydence Arnesen: Jenna Luo Other Clinician: Referring Indigo Barbian: Jenna Luo Treating Candelaria Pies/Extender: Tito Dine in Treatment: 6 Vital Signs Time Taken: 08:25 Temperature (F): 98.3 Height (in): 69 Pulse (bpm): 87 Weight (lbs): 183 Respiratory Rate (breaths/min): 16 Body Mass Index (BMI): 27 Blood Pressure (mmHg): 151/91 Reference Range: 80 - 120 mg / dl Electronic Signature(s) Signed: 01/22/2019 4:26:58 PM By: Harold Barban Entered By: Harold Barban on 01/22/2019 08:30:09

## 2019-01-22 NOTE — Progress Notes (Signed)
Kenneth Cline ([id number]) Visit Report for 01/22/2019 HPI Details Patient Name: Kenneth Cline, Kenneth Cline Date of Service: 01/22/2019 8:30 AM Medical Record Number: [id number] Patient Account Number: [id number] Date of Birth/Sex: Sep 10, 1955 (63 y.o. M) Treating RN: Cornell Barman Primary Care Provider: Jenna Luo Other Clinician: Referring Provider: Jenna Luo Treating Provider/Extender: Tito Dine in Treatment: 6 History of Present Illness HPI Description: ADMISSION 12/11/2018 This is a 63 year old man who is a type II diabetic. He apparently developed a "boil" on his right lower leg sometime in mid June. He subsequently went to the beach and was in the ocean. He was admitted to [facility] from 6/20 through 6/30 with septic shock felt to be secondary from cellulitis in the right lower leg. There was concern for compartment syndrome and on 6/20 he underwent a right leg fasciotomy by Dr. Ardeth Sportsman of surgery. He received different courses of antibiotics. As far as I am able to determine the wound had an MSSA infection based on follow-up notes from general surgery but I cannot see the actual culture result. In any case he was discharged on doxycycline. He also had a wound VAC on this for a period of time. He saw general surgery in follow-up on 11/14/2018 and I am able to see this note in [facility] link. He had an "right lower extremity MSSA infection". At that point his wounds were 2 measuring 3 x 4 x 5x2 wound VAC was discontinued at that time and he was put on a wet to dry dressing. He also was given clindamycin for the recurrent wound infection. He was scheduled actually to go to the OR for further debridement although I cannot see that this was actually done. He tells me he went to the wound care center at [facility] on 3 occasions. By the time he was seen at the wound care center on 8 5 there were 3 wounds all requiring debridement. They were dressed with Iodosorb  and poly-man. On 8/12 a wound culture was done that showed light Pseudomonas and a few Staphylococcus although he was not started on antibiotics out of concern for a drug interaction with Coumadin. He actually lives in [place] so he is come to our clinic today for follow-up. 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 His ABIs in our clinic were 1.27 on the right and 1.2 on the left. 8/26; apparently home health was not moistening the Hydrofera Blue classic so it stuck to the wound nevertheless the major wound is down in size. There are 3 open areas the original surgical site and then just laterally to this a more proximal small wound and more distally a larger wound. I did not put him on antibiotics last week has nothing look infected. Still nothing looks infected today 9/2; we have been using Hydrofera Blue. He wants to dismiss home health. We have him in 3 layer compression. His wife apparently is able to do Curlex Coban and has this at home. He is going to [employer] next week and wants a 2-week follow- up. The original surgical wound I think is healed although it has some surface eschar on it. The small superior wound is closed and his major wound is smaller 9/23; using Hydrofera Blue. He was not able to come last week as he presented to the  clinic with a fever. Subsequently tested Covid negative. Wound has improved, dimensions are improved 9/30; using Hydrofera Blue. He only has 1 remaining wound which was the distal wound on the medial part of the tibia. The more superior area is close this week Electronic Signature(s) Signed: 01/22/2019 4:51:07 PM By: Linton Ham MD Entered By: Linton Ham on 01/22/2019 08:46:49 Kenneth Cline  ([id number]) Kenneth Cline ([id number]) -------------------------------------------------------------------------------- Physical Exam Details Patient Name: Kenneth Cline Date of Service: 01/22/2019 8:30 AM Medical Record Number: [id number] Patient Account Number: [id number] Date of Birth/Sex: 13-Feb-1956 (63 y.o. M) Treating RN: Cornell Barman Primary Care Provider: Jenna Luo Other Clinician: Referring Provider: Jenna Luo Treating Provider/Extender: Tito Dine in Treatment: 6 Constitutional Patient is hypertensive.. Pulse regular and within target range for patient.Marland Kitchen Respirations regular, non-labored and within target range.. Temperature is normal and within the target range for the patient.Marland Kitchen appears in no distress. Eyes Conjunctivae clear. No discharge. Respiratory Respiratory effort is easy and symmetric bilaterally. Rate is normal at rest and on room air.. Cardiovascular Pedal pulses are palpable. Excellent edema control. Integumentary (Hair, Skin) No erythema around the wound. Psychiatric No evidence of depression, anxiety, or agitation. Calm, cooperative, and communicative. Appropriate interactions and affect.. Notes Wound exam; patient only has 1 remaining area on the medial part of the tibia distally. Dimensions are better here. Epithelialization under illumination proximal wound is healed. Electronic Signature(s) Signed: 01/22/2019 4:51:07 PM By: Linton Ham MD Entered By: Linton Ham on 01/22/2019 08:48:36 Kenneth Cline, Kenneth Cline ([id number]) -------------------------------------------------------------------------------- Physician Orders Details Patient Name: Kenneth Cline Date of Service: 01/22/2019 8:30 AM Medical Record Number: [id number] Patient Account Number: [id number] Date of Birth/Sex: 06/15/1955 (63 y.o. M) Treating RN: Cornell Barman Primary Care Provider: Jenna Luo Other Clinician: Referring Provider: Jenna Luo Treating  Provider/Extender: Tito Dine in Treatment: 6 Verbal / Phone Orders: Yes Clinician: Cornell Barman Read Back and Verified: No Diagnosis Coding Wound Cleansing Wound #2 Right,Distal,Medial Lower Leg o Cleanse wound with mild soap and water o May Shower, gently pat wound dry prior to applying new dressing. Anesthetic (add to Medication List) Wound #2 Right,Distal,Medial Lower Leg o Topical Lidocaine 4% cream applied to wound bed prior to debridement (In Clinic Only). Primary Wound Dressing Wound #2 Right,Distal,Medial Lower Leg o Hydrafera Blue Ready Transfer Secondary Dressing Wound #2 Right,Distal,Medial Lower Leg o ABD pad Dressing Change Frequency Wound #2 Right,Distal,Medial Lower Leg o Change dressing every week Follow-up Appointments Wound #2 Right,Distal,Medial Lower Leg o Return Appointment in 1 week. Edema Control Wound #2 Right,Distal,Medial Lower Leg o 3 Layer Compression System - Right Lower Extremity - unna to anchor o Elevate legs to the level of the heart and pump ankles as often as possible Electronic Signature(s) Signed: 01/22/2019 4:51:07 PM By: Linton Ham MD Signed: 01/22/2019 5:01:16 PM By: Gretta Cool, BSN, RN, CWS, Kim RN, BSN Entered By: Gretta Cool, BSN, RN, CWS, Kim on 01/22/2019 08:49:36 Kenneth Cline, Kenneth Cline ([id number]) -------------------------------------------------------------------------------- Problem List Details Patient Name: Kenneth Cline Date of Service: 01/22/2019 8:30 AM Medical Record Number: [id number] Patient Account Number: [id number] Date of Birth/Sex: April 05, 1956 (63 y.o. M) Treating RN: Cornell Barman Primary Care Provider: Jenna Luo Other Clinician: Referring Provider: Jenna Luo Treating Provider/Extender: Tito Dine in Treatment: 6 Active Problems ICD-10 Evaluated Encounter Code Description Active Date Today Diagnosis L97.818 Non-pressure chronic ulcer of other part of right lower leg  12/11/2018 No Yes with other specified severity T81.31XA Disruption of external operation (surgical) wound, not 12/11/2018 No Yes elsewhere  classified, initial encounter E11.622 Type 2 diabetes mellitus with other skin ulcer 12/18/2018 No Yes Inactive Problems Resolved Problems Electronic Signature(s) Signed: 01/22/2019 4:51:07 PM By: Linton Ham MD Entered By: Linton Ham on 01/22/2019 08:46:04 Kenneth Cline ([id number]) -------------------------------------------------------------------------------- Progress Note Details Patient Name: Kenneth Cline Date of Service: 01/22/2019 8:30 AM Medical Record Number: [id number] Patient Account Number: [id number] Date of Birth/Sex: 25-Mar-1956 (63 y.o. M) Treating RN: Cornell Barman Primary Care Provider: Jenna Luo Other Clinician: Referring Provider: Jenna Luo Treating Provider/Extender: Tito Dine in Treatment: 6 Subjective History of Present Illness (HPI) ADMISSION 12/11/2018 This is a 63 year old man who is a type II diabetic. He apparently developed a "boil" on his right lower leg sometime in mid June. He subsequently went to the beach and was in the ocean. He was admitted to [facility] from 6/20 through 6/30 with septic shock felt to be secondary from cellulitis in the right lower leg. There was concern for compartment syndrome and on 6/20 he underwent a right leg fasciotomy by Dr. Ardeth Sportsman of surgery. He received different courses of antibiotics. As far as I am able to determine the wound had an MSSA infection based on follow-up notes from general surgery but I cannot see the actual culture result. In any case he was discharged on doxycycline. He also had a wound VAC on this for a period of time. He saw general surgery in follow-up on 11/14/2018 and I am able to see this note in [facility] link. He had an "right lower extremity MSSA infection". At that point his wounds were 2 measuring 3 x 4 x 5x2 wound VAC  was discontinued at that time and he was put on a wet to dry dressing. He also was given clindamycin for the recurrent wound infection. He was scheduled actually to go to the OR for further debridement although I cannot see that this was actually done. He tells me he went to the wound care center at [facility] on 3 occasions. By the time he was seen at the wound care center on 8 5 there were 3 wounds all requiring debridement. They were dressed with Iodosorb and poly-man. On 8/12 a wound culture was done that showed light Pseudomonas and a few Staphylococcus although he was not started on antibiotics out of concern for a drug interaction with Coumadin. He actually lives in [place] so he is come to our clinic today for follow-up. 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 His ABIs in our clinic were 1.27 on the right and 1.2 on the left. 8/26; apparently home health was not moistening the Hydrofera Blue classic so it stuck to the wound nevertheless the major wound is down in size. There are 3 open areas the original surgical site and then just laterally to this a more proximal small wound and more distally a larger wound. I did not put him on antibiotics last week has nothing look infected. Still nothing looks infected today 9/2; we have been using Hydrofera Blue. He wants to dismiss home health. We have him in 3 layer compression. His wife apparently is able to do Curlex Coban and has this at home. He is going to [employer] next week and wants a 2-week follow- up. The original surgical wound I think is healed although it has  some surface eschar on it. The small superior wound is closed and his major wound is smaller 9/23; using  Hydrofera Blue. He was not able to come last week as he presented to the clinic with a fever. Subsequently tested Covid negative. Wound has improved, dimensions are improved 9/30; using Hydrofera Blue. He only has 1 remaining wound which was the distal wound on the medial part of the tibia. The more superior area is close this week Objective Kenneth Cline, Kenneth L. ([id number]) Constitutional Patient is hypertensive.. Pulse regular and within target range for patient.Marland Kitchen Respirations regular, non-labored and within target range.. Temperature is normal and within the target range for the patient.Marland Kitchen appears in no distress. Vitals Time Taken: 8:25 AM, Height: 69 in, Weight: 183 lbs, BMI: 27, Temperature: 98.3 F, Pulse: 87 bpm, Respiratory Rate: 16 breaths/min, Blood Pressure: 151/91 mmHg. Eyes Conjunctivae clear. No discharge. Respiratory Respiratory effort is easy and symmetric bilaterally. Rate is normal at rest and on room air.. Cardiovascular Pedal pulses are palpable. Excellent edema control. Psychiatric No evidence of depression, anxiety, or agitation. Calm, cooperative, and communicative. Appropriate interactions and affect.. General Notes: Wound exam; patient only has 1 remaining area on the medial part of the tibia distally. Dimensions are better here. Epithelialization under illumination proximal wound is healed. Integumentary (Hair, Skin) No erythema around the wound. Wound #1 status is Healed - Epithelialized. Original cause of wound was Gradually Appeared. The wound is located on the Right,Proximal,Medial Lower Leg. The wound measures 0cm length x 0cm width x 0cm depth; 0cm^2 area and 0cm^3 volume. The wound is limited to skin breakdown. There is no tunneling or undermining noted. There is a none present amount of drainage noted. The wound margin is flat and intact. There is no granulation within the wound bed. There is a large (67- 100%) amount of necrotic tissue within the wound bed  including Eschar. Wound #2 status is Open. Original cause of wound was Gradually Appeared. The wound is located on the Right,Distal,Medial Lower Leg. The wound measures 2.4cm length x 1.3cm width x 0.1cm depth; 2.45cm^2 area and 0.245cm^3 volume. There is Fat Layer (Subcutaneous Tissue) Exposed exposed. There is no tunneling or undermining noted. There is a medium amount of serous drainage noted. The wound margin is flat and intact. There is large (67-100%) red granulation within the wound bed. There is a small (1-33%) amount of necrotic tissue within the wound bed including Adherent Slough. Wound #3 status is Healed - Epithelialized. Original cause of wound was Surgical Injury. The wound is located on the Right,Medial Lower Leg. The wound measures 0cm length x 0cm width x 0cm depth; 0cm^2 area and 0cm^3 volume. The wound is limited to skin breakdown. There is no tunneling or undermining noted. There is a none present amount of drainage noted. The wound margin is flat and intact. There is no granulation within the wound bed. There is a large (67-100%) amount of necrotic tissue within the wound bed including Eschar. Assessment Active Problems ICD-10 Non-pressure chronic ulcer of other part of right lower leg with other specified severity Disruption of external operation (surgical) wound, not elsewhere classified, initial encounter Type 2 diabetes mellitus with other skin ulcer Kenneth Cline, Kenneth L. ([id number]) Procedures Wound #2 Pre-procedure diagnosis of Wound #2 is a Cellulitis located on the Right,Distal,Medial Lower Leg . There was a Three Layer Compression Therapy Procedure by Cornell Barman, RN. Post procedure Diagnosis Wound #2: Same as Pre-Procedure Plan Wound Cleansing: Wound #2 Right,Distal,Medial Lower Leg: Cleanse wound with mild soap  and water May Shower, gently pat wound dry prior to applying new dressing. Anesthetic (add to Medication List): Wound #2 Right,Distal,Medial Lower  Leg: Topical Lidocaine 4% cream applied to wound bed prior to debridement (In Clinic Only). Primary Wound Dressing: Wound #2 Right,Distal,Medial Lower Leg: Hydrafera Blue Ready Transfer Secondary Dressing: Wound #2 Right,Distal,Medial Lower Leg: ABD pad Dressing Change Frequency: Wound #2 Right,Distal,Medial Lower Leg: Change dressing every week Follow-up Appointments: Wound #2 Right,Distal,Medial Lower Leg: Return Appointment in 1 week. Edema Control: Wound #2 Right,Distal,Medial Lower Leg: 3 Layer Compression System - Right Lower Extremity Elevate legs to the level of the heart and pump ankles as often as possible 1. Continuing Hydrofera Blue under 3 layer compression. 2. Careful attention to the dimensions of the remaining wound next week. The more proximal wound is healed Electronic Signature(s) Signed: 01/22/2019 4:51:07 PM By: Linton Ham MD Entered By: Linton Ham on 01/22/2019 08:49:10 Kenneth Cline, Kenneth Cline ([id number]) -------------------------------------------------------------------------------- SuperBill Details Patient Name: Kenneth Cline Date of Service: 01/22/2019 Medical Record Number: [id number] Patient Account Number: [id number] Date of Birth/Sex: 01/30/1956 (63 y.o. M) Treating RN: Cornell Barman Primary Care Provider: Jenna Luo Other Clinician: Referring Provider: Jenna Luo Treating Provider/Extender: Tito Dine in Treatment: 6 Diagnosis Coding ICD-10 Codes Code Description [phone] Non-pressure chronic ulcer of other part of right lower leg with other specified severity T81.31XA Disruption of external operation (surgical) wound, not elsewhere classified, initial encounter E11.622 Type 2 diabetes mellitus with other skin ulcer Facility Procedures CPT4 Code Description: [id number] (Facility Use Only) [phone] - APPLY MULTLAY COMPRS LWR RT LEG Modifier: Quantity: 1 Physician Procedures CPT4: Description Modifier Quantity Code [id number]  [id number] - WC PHYS LEVEL 3 - EST PT 1 ICD-10 Diagnosis Description L97.818 Non-pressure chronic ulcer of other part of right lower leg with other specified severity T81.31XA Disruption of external operation  (surgical) wound, not elsewhere classified, initial encounter E11.622 Type 2 diabetes mellitus with other skin ulcer Electronic Signature(s) Signed: 01/22/2019 4:51:07 PM By: Linton Ham MD Entered By: Linton Ham on 01/22/2019 08:49:28

## 2019-01-27 ENCOUNTER — Other Ambulatory Visit: Payer: Self-pay | Admitting: Cardiology

## 2019-01-27 ENCOUNTER — Other Ambulatory Visit: Payer: Self-pay

## 2019-01-28 ENCOUNTER — Ambulatory Visit (INDEPENDENT_AMBULATORY_CARE_PROVIDER_SITE_OTHER): Payer: Medicare HMO | Admitting: Pharmacist

## 2019-01-28 DIAGNOSIS — K8301 Primary sclerosing cholangitis: Secondary | ICD-10-CM | POA: Diagnosis not present

## 2019-01-28 DIAGNOSIS — I823 Embolism and thrombosis of renal vein: Secondary | ICD-10-CM | POA: Diagnosis not present

## 2019-01-28 DIAGNOSIS — Z7901 Long term (current) use of anticoagulants: Secondary | ICD-10-CM | POA: Diagnosis not present

## 2019-01-28 LAB — PROTIME-INR: INR: 1.8 — AB (ref <=1.1)

## 2019-01-29 ENCOUNTER — Other Ambulatory Visit: Payer: Self-pay

## 2019-01-29 ENCOUNTER — Encounter: Payer: Medicare HMO | Attending: Internal Medicine | Admitting: Internal Medicine

## 2019-01-29 DIAGNOSIS — E11622 Type 2 diabetes mellitus with other skin ulcer: Secondary | ICD-10-CM | POA: Insufficient documentation

## 2019-01-29 DIAGNOSIS — E039 Hypothyroidism, unspecified: Secondary | ICD-10-CM | POA: Diagnosis not present

## 2019-01-29 DIAGNOSIS — Z86718 Personal history of other venous thrombosis and embolism: Secondary | ICD-10-CM | POA: Diagnosis not present

## 2019-01-29 DIAGNOSIS — Z7901 Long term (current) use of anticoagulants: Secondary | ICD-10-CM | POA: Insufficient documentation

## 2019-01-29 DIAGNOSIS — L97819 Non-pressure chronic ulcer of other part of right lower leg with unspecified severity: Secondary | ICD-10-CM | POA: Insufficient documentation

## 2019-01-29 DIAGNOSIS — I10 Essential (primary) hypertension: Secondary | ICD-10-CM | POA: Insufficient documentation

## 2019-01-29 DIAGNOSIS — E2740 Unspecified adrenocortical insufficiency: Secondary | ICD-10-CM | POA: Diagnosis not present

## 2019-01-29 DIAGNOSIS — D6862 Lupus anticoagulant syndrome: Secondary | ICD-10-CM | POA: Insufficient documentation

## 2019-01-29 DIAGNOSIS — L03115 Cellulitis of right lower limb: Secondary | ICD-10-CM | POA: Insufficient documentation

## 2019-01-29 NOTE — Progress Notes (Signed)
JORAM, LEIBY ([id number]) Visit Report for 01/29/2019 Arrival Information Details Patient Name: Kenneth Cline Date of Service: 01/29/2019 8:30 AM Medical Record Number: [id number] Patient Account Number: [id number] Date of Birth/Sex: [birth year] (63 y.o. M) Treating RN: Army Melia Primary Care Rafeef Lau: Jenna Luo Other Clinician: Referring Wendelyn Kiesling: Jenna Luo Treating Allanah Mcfarland/Extender: Tito Dine in Treatment: 7 Visit Information History Since Last Visit Added or deleted any medications: No Patient Arrived: Ambulatory Any new allergies or adverse reactions: No Arrival Time: 08:23 Had a fall or experienced change in No Accompanied By: self activities of daily living that may affect Transfer Assistance: None risk of falls: Patient Identification Verified: Yes Signs or symptoms of abuse/neglect since last visito No Patient Has Alerts: Yes Hospitalized since last visit: No Patient Alerts: Patient on Blood Thinner Has Dressing in Place as Prescribed: Yes Warfarin Pain Present Now: No ABI L 1.2 R 1.27 Electronic Signature(s) Signed: 01/29/2019 11:23:01 AM By: Army Melia Entered By: Army Melia on 01/29/2019 08:23:50 Kenneth Cline ([id number]) -------------------------------------------------------------------------------- Encounter Discharge Information Details Patient Name: Kenneth Cline Date of Service: 01/29/2019 8:30 AM Medical Record Number: [id number] Patient Account Number: [id number] Date of Birth/Sex: July 21, 1955 (63 y.o. M) Treating RN: Cornell Barman Primary Care Clista Rainford: Jenna Luo Other Clinician: Referring Nawaal Alling: Jenna Luo Treating Miller Edgington/Extender: Tito Dine in Treatment: 7 Encounter Discharge Information Items Post Procedure Vitals Discharge Condition: Stable Temperature (F): 99.6 Ambulatory Status: Ambulatory Pulse (bpm): 89 Discharge Destination: Home Respiratory Rate (breaths/min):  16 Transportation: Private Auto Blood Pressure (mmHg): 132/89 Accompanied By: self Schedule Follow-up Appointment: Yes Clinical Summary of Care: Electronic Signature(s) Signed: 01/29/2019 5:19:46 PM By: Gretta Cool, BSN, RN, CWS, Kim RN, BSN Entered By: Gretta Cool, BSN, RN, CWS, Kim on 01/29/2019 08:42:43 Kenneth Cline ([id number]) -------------------------------------------------------------------------------- Lower Extremity Assessment Details Patient Name: Kenneth Cline Date of Service: 01/29/2019 8:30 AM Medical Record Number: [id number] Patient Account Number: [id number] Date of Birth/Sex: [birth year] (63 y.o. M) Treating RN: Army Melia Primary Care Brannon Levene: Jenna Luo Other Clinician: Referring Salimah Martinovich: Jenna Luo Treating Riggs Dineen/Extender: Tito Dine in Treatment: 7 Edema Assessment Assessed: [Left: No] [Right: No] Edema: [Left: N] [Right: o] Calf Left: Right: Point of Measurement: 34 cm From Medial Instep cm 35 cm Ankle Left: Right: Point of Measurement: 10 cm From Medial Instep cm 21 cm Vascular Assessment Pulses: Dorsalis Pedis Palpable: [Right:Yes] Electronic Signature(s) Signed: 01/29/2019 11:23:01 AM By: Army Melia Entered By: Army Melia on 01/29/2019 08:33:21 Digilio, [name] ([id number]) -------------------------------------------------------------------------------- Multi Wound Chart Details Patient Name: Kenneth Cline Date of Service: 01/29/2019 8:30 AM Medical Record Number: [id number] Patient Account Number: [id number] Date of Birth/Sex: [birth year] (63 y.o. M) Treating RN: Cornell Barman Primary Care Kristalyn Bergstresser: Jenna Luo Other Clinician: Referring Nella Botsford: Jenna Luo Treating Anayelli Lai/Extender: Tito Dine in Treatment: 7 Vital Signs Height(in): 69 Pulse(bpm): 89 Weight(lbs): 183 Blood Pressure(mmHg): 132/89 Body Mass Index(BMI): 27 Temperature(F): 99.6 Respiratory Rate 16 (breaths/min): Photos:  [[name]:[name]] Wound Location: Right Lower Leg - Medial, [name] [name] Distal Wounding Event: Gradually Appeared [name] [name] Primary Etiology: Cellulitis [name] [name] Comorbid History: Type II Diabetes [name] [name] Date Acquired: 10/07/2018 [name] [name] Weeks of Treatment: 7 [name] [name] Wound Status: Open [name] [name] Measurements L x W x D 1x0.3x0.1 [name] [name] (cm) Area (cm) : 0.236 [name] [name] Volume (cm) : 0.024 [name] [name] % Reduction in Area: 97.30% [name] [name] % Reduction in Volume: 99.30% [name] [name] Classification: Full Thickness Without [name] [name] Exposed Support Structures Exudate Amount: Medium [name] [name] Exudate Type:  Serous [name] [name] Exudate Color: amber [name] [name] Wound Margin: Flat and Intact [name] [name] Granulation Amount: Small (1-33%) [name] [name] Granulation Quality: Red [name] [name] Necrotic Amount: Large (67-100%) [name] [name] Necrotic Tissue: Eschar, Adherent Slough [name] [name] Exposed Structures: Fat Layer (Subcutaneous [name] [name] Tissue) Exposed: Yes Fascia: No Tendon: No Muscle: No Rayson, Emmanuell L. ([id number]) Joint: No Bone: No Epithelialization: Large (67-100%) [name] [name] Debridement: Debridement - Selective/Open [name] [name] Wound Pre-procedure 08:35 [name] [name] Verification/Time Out Taken: Pain Control: Lidocaine [name] [name] Tissue Debrided: Necrotic/Eschar [name] [name] Level: Non-Viable Tissue [name] [name] Debridement Area (sq cm): 0.3 [name] [name] Instrument: Curette [name] [name] Bleeding: Minimum [name] [name] Hemostasis Achieved: Pressure [name] [name] Debridement Treatment Procedure was tolerated well [name] [name] Response: Post Debridement 1x0.3x0.1 [name] [name] Measurements L x W x D (cm) Post Debridement Volume: 0.024 [name] [name] (cm) Procedures Performed: Debridement [name] [name] Treatment Notes Wound #2 (Right, Distal, Medial Lower Leg) Notes Hydrofera Blue, BFD Electronic Signature(s) Signed: 01/29/2019 5:14:46 PM By: Linton Ham MD Entered By: Linton Ham on 01/29/2019 08:44:15 Kenneth Cline  ([id number]) -------------------------------------------------------------------------------- Multi-Disciplinary Care Plan Details Patient Name: Kenneth Cline Date of Service: 01/29/2019 8:30 AM Medical Record Number: [id number] Patient Account Number: [id number] Date of Birth/Sex: Oct 14, 1955 (63 y.o. M) Treating RN: Cornell Barman Primary Care Stefannie Defeo: Jenna Luo Other Clinician: Referring Elgie Maziarz: Jenna Luo Treating Sharran Caratachea/Extender: Tito Dine in Treatment: 7 Active Inactive Medication Nursing Diagnoses: Knowledge deficit related to medication safety: actual or potential Goals: Patient/caregiver will demonstrate understanding of all current medications Date Initiated: 12/11/2018 Target Resolution Date: 12/11/2018 Goal Status: Active Interventions: Assess for medication contraindications each visit where new medications are prescribed Treatment Activities: New medication prescribed at [facility] : 12/11/2018 Notes: Necrotic Tissue Nursing Diagnoses: Impaired tissue integrity related to necrotic/devitalized tissue Goals: Necrotic/devitalized tissue will be minimized in the wound bed Date Initiated: 12/11/2018 Target Resolution Date: 12/10/2018 Goal Status: Active Interventions: Assess patient pain level pre-, during and post procedure and prior to discharge Treatment Activities: Excisional debridement : 12/11/2018 Notes: Soft Tissue Infection Nursing Diagnoses: Impaired tissue integrity Potential for infection: soft tissue Nyquist, Ferd L. ([id number]) Goals: Patient will remain free of wound infection Date Initiated: 12/11/2018 Target Resolution Date: 12/18/2018 Goal Status: Active Interventions: Assess signs and symptoms of infection every visit Notes: Wound/Skin Impairment Nursing Diagnoses: Impaired tissue integrity Goals: Patient/caregiver will verbalize understanding of skin care regimen Date Initiated: 12/11/2018 Target Resolution Date:  12/18/2018 Goal Status: Active Ulcer/skin breakdown will have a volume reduction of 30% by week 4 Date Initiated: 12/11/2018 Target Resolution Date: 01/11/2019 Goal Status: Active Interventions: Assess ulceration(s) every visit Treatment Activities: Referred to DME Jahyra Sukup for dressing supplies : 12/11/2018 Skin care regimen initiated : 12/11/2018 Notes: Electronic Signature(s) Signed: 01/29/2019 5:19:46 PM By: Gretta Cool, BSN, RN, CWS, Kim RN, BSN Entered By: Gretta Cool, BSN, RN, CWS, Kim on 01/29/2019 08:37:56 Gaughran, [name] ([id number]) -------------------------------------------------------------------------------- Pain Assessment Details Patient Name: Kenneth Cline Date of Service: 01/29/2019 8:30 AM Medical Record Number: [id number] Patient Account Number: [id number] Date of Birth/Sex: December 30, 1955 (63 y.o. M) Treating RN: Army Melia Primary Care Dusan Lipford: Jenna Luo Other Clinician: Referring Gabriella Guile: Jenna Luo Treating Delfino Friesen/Extender: Tito Dine in Treatment: 7 Active Problems Location of Pain Severity and Description of Pain Patient Has Paino No Site Locations Pain Management and Medication Current Pain Management: Electronic Signature(s) Signed: 01/29/2019 11:23:01 AM By: Army Melia Entered By: Army Melia on 01/29/2019 08:23:54 Peckenpaugh, [name] ([id number]) -------------------------------------------------------------------------------- Patient/Caregiver Education Details Patient Name: Kenneth Cline Date  of Service: 01/29/2019 8:30 AM Medical Record Number: [id number] Patient Account Number: [id number] Date of Birth/Gender: 27-Oct-1955 (63 y.o. M) Treating RN: Cornell Barman Primary Care Physician: Jenna Luo Other Clinician: Referring Physician: Jenna Luo Treating Physician/Extender: Tito Dine in Treatment: 7 Education Assessment Education Provided To: Patient Education Topics Provided Wound/Skin Impairment: Handouts:  Caring for Your Ulcer Methods: Demonstration, Explain/Verbal Responses: State content correctly Electronic Signature(s) Signed: 01/29/2019 5:19:46 PM By: Gretta Cool, BSN, RN, CWS, Kim RN, BSN Entered By: Gretta Cool, BSN, RN, CWS, Kim on 01/29/2019 08:41:54 Kenneth Cline ([id number]) -------------------------------------------------------------------------------- Wound Assessment Details Patient Name: Kenneth Cline Date of Service: 01/29/2019 8:30 AM Medical Record Number: [id number] Patient Account Number: [id number] Date of Birth/Sex: 16-Jul-1955 (63 y.o. M) Treating RN: Army Melia Primary Care Darril Patriarca: Jenna Luo Other Clinician: Referring Renee Beale: Jenna Luo Treating Jayren Cease/Extender: Tito Dine in Treatment: 7 Wound Status Wound Number: 2 Primary Etiology: Cellulitis Wound Location: Right Lower Leg - Medial, Distal Wound Status: Open Wounding Event: Gradually Appeared Comorbid History: Type II Diabetes Date Acquired: 10/07/2018 Weeks Of Treatment: 7 Clustered Wound: No Photos Wound Measurements Length: (cm) 1 Width: (cm) 0.3 Depth: (cm) 0.1 Area: (cm) 0.236 Volume: (cm) 0.024 % Reduction in Area: 97.3% % Reduction in Volume: 99.3% Epithelialization: Large (67-100%) Tunneling: No Undermining: No Wound Description Full Thickness Without Exposed Support Classification: Structures Wound Margin: Flat and Intact Exudate Medium Amount: Exudate Type: Serous Exudate Color: amber Foul Odor After Cleansing: No Slough/Fibrino Yes Wound Bed Granulation Amount: Small (1-33%) Exposed Structure Granulation Quality: Red Fascia Exposed: No Necrotic Amount: Large (67-100%) Fat Layer (Subcutaneous Tissue) Exposed: Yes Necrotic Quality: Eschar, Adherent Slough Tendon Exposed: No Muscle Exposed: No Joint Exposed: No Bone Exposed: No Finnan, Dairon L. ([id number]) Treatment Notes Wound #2 (Right, Distal, Medial Lower Leg) Notes Hydrofera Blue,  BFD Electronic Signature(s) Signed: 01/29/2019 11:23:01 AM By: Army Melia Entered By: Army Melia on 01/29/2019 08:31:43 Kenneth Cline ([id number]) -------------------------------------------------------------------------------- Vitals Details Patient Name: Kenneth Cline Date of Service: 01/29/2019 8:30 AM Medical Record Number: [id number] Patient Account Number: [id number] Date of Birth/Sex: January 04, 1956 (63 y.o. M) Treating RN: Army Melia Primary Care Aryaa Bunting: Jenna Luo Other Clinician: Referring Sundra Haddix: Jenna Luo Treating Abrham Maslowski/Extender: Tito Dine in Treatment: 7 Vital Signs Time Taken: 08:23 Temperature (F): 99.6 Height (in): 69 Pulse (bpm): 89 Weight (lbs): 183 Respiratory Rate (breaths/min): 16 Body Mass Index (BMI): 27 Blood Pressure (mmHg): 132/89 Reference Range: 80 - 120 mg / dl Electronic Signature(s) Signed: 01/29/2019 11:23:01 AM By: Army Melia Entered By: Army Melia on 01/29/2019 08:24:29

## 2019-01-29 NOTE — Progress Notes (Signed)
Kenneth, Cline ([id number]) Visit Report for 01/29/2019 Debridement Details Patient Name: Kenneth Cline, Kenneth Cline Date of Service: 01/29/2019 8:30 AM Medical Record Number: [id number] Patient Account Number: [id number] Date of Birth/Sex: December 30, 1955 (63 y.o. M) Treating RN: Cornell Barman Primary Care Provider: Jenna Luo Other Clinician: Referring Provider: Jenna Luo Treating Provider/Extender: Tito Dine in Treatment: 7 Debridement Performed for Wound #2 Right,Distal,Medial Lower Leg Assessment: Performed By: Physician Ricard Dillon, MD Debridement Type: Debridement Level of Consciousness (Pre- Awake and Alert procedure): Pre-procedure Verification/Time Yes - 08:35 Out Taken: Start Time: 08:35 Pain Control: Lidocaine Total Area Debrided (L x W): 1 (cm) x 0.3 (cm) = 0.3 (cm) Tissue and other material Non-Viable, Eschar, Fibrin/Exudate debrided: Level: Non-Viable Tissue Debridement Description: Selective/Open Wound Instrument: Curette Bleeding: Minimum Hemostasis Achieved: Pressure End Time: 08:37 Response to Treatment: Procedure was tolerated well Level of Consciousness Responds to Painful Stimuli (Post-procedure): Post Debridement Measurements of Total Wound Length: (cm) 1 Width: (cm) 0.3 Depth: (cm) 0.1 Volume: (cm) 0.024 Character of Wound/Ulcer Post Debridement: Stable Post Procedure Diagnosis Same as Pre-procedure Electronic Signature(s) Signed: 01/29/2019 5:14:46 PM By: Linton Ham MD Signed: 01/29/2019 5:19:46 PM By: Gretta Cool, BSN, RN, CWS, Kim RN, BSN Entered By: Linton Ham on 01/29/2019 08:44:28 Kenneth Cline ([id number]) -------------------------------------------------------------------------------- HPI Details Patient Name: Kenneth Cline Date of Service: 01/29/2019 8:30 AM Medical Record Number: [id number] Patient Account Number: [id number] Date of Birth/Sex: 02-Mar-1956 (63 y.o. M) Treating RN: Cornell Barman Primary Care Provider:  Jenna Luo Other Clinician: Referring Provider: Jenna Luo Treating Provider/Extender: Tito Dine in Treatment: 7 History of Present Illness HPI Description: ADMISSION 12/11/2018 This is a 63 year old man who is a type II diabetic. He apparently developed a "boil" on his right lower leg sometime in mid June. He subsequently went to the beach and was in the ocean. He was admitted to [facility] from 6/20 through 6/30 with septic shock felt to be secondary from cellulitis in the right lower leg. There was concern for compartment syndrome and on 6/20 he underwent a right leg fasciotomy by Dr. Ardeth Sportsman of surgery. He received different courses of antibiotics. As far as I am able to determine the wound had an MSSA infection based on follow-up notes from general surgery but I cannot see the actual culture result. In any case he was discharged on doxycycline. He also had a wound VAC on this for a period of time. He saw general surgery in follow-up on 11/14/2018 and I am able to see this note in [facility] link. He had an "right lower extremity MSSA infection". At that point his wounds were 2 measuring 3 x 4 x 5x2 wound VAC was discontinued at that time and he was put on a wet to dry dressing. He also was given clindamycin for the recurrent wound infection. He was scheduled actually to go to the OR for further debridement although I cannot see that this was actually done. He tells me he went to the wound care center at [facility] on 3 occasions. By the time he was seen at the wound care center on 8 5 there were 3 wounds all requiring debridement. They were dressed with Iodosorb and poly-man. On 8/12 a wound culture was done that showed light Pseudomonas and a few Staphylococcus although he was not started on antibiotics out of concern for a drug interaction with Coumadin. He actually lives in [place] so he is come to our clinic today for follow-up. 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 His ABIs in our clinic were 1.27 on the right and 1.2 on the left. 8/26; apparently home health was not moistening the Hydrofera Blue classic so it stuck to the wound nevertheless the major wound is down in size. There are 3 open areas the original surgical site and then just laterally to this a more proximal small wound and more distally a larger wound. I did not put him on antibiotics last week has nothing look infected. Still nothing looks infected today 9/2; we have been using Hydrofera Blue. He wants to dismiss home health. We have him in 3 layer compression. His wife apparently is able to do Curlex Coban and has this at home. He is going to [employer] next week and wants a 2-week follow- up. The original surgical wound I think is healed although it has some surface eschar on it. The small superior wound is closed and his major wound is smaller 9/23; using Hydrofera Blue. He was not able to come last week as he presented to the clinic with a fever. Subsequently tested Covid negative. Wound has improved, dimensions are improved 9/30; using Hydrofera Blue. He only has 1 remaining wound which was the distal wound on the medial part of the tibia. The more superior area is close this week 10/7; using Hydrofera Blue under 3 layer compression. Came in today with eschared areas over the remanence of the wound. This was removed he still has some areas that are not fully epithelialized but this is close Electronic Signature(s) Signed: 01/29/2019 5:14:46 PM By: Linton Ham MD Entered By: Linton Ham on 01/29/2019 08:45:13 Kenneth Cline  ([id number]) -------------------------------------------------------------------------------- Physical Exam Details Patient Name: Kenneth Cline Date of Service: 01/29/2019 8:30 AM Medical Record Number: [id number] Patient Account Number: [id number] Date of Birth/Sex: [birth year] (63 y.o. M) Treating RN: Cornell Barman Primary Care Provider: Jenna Luo Other Clinician: Referring Provider: Jenna Luo Treating Provider/Extender: Tito Dine in Treatment: 7 Constitutional Sitting or standing Blood Pressure is within target range for patient.. Pulse regular and within target range for patient.Marland Kitchen Respirations regular, non-labored and within target range.. Temperature is normal and within the target range for the patient.Marland Kitchen appears in no distress. Cardiovascular Pedal pulses palpable. Notes Wound exam; arrived today with a wound eschar and some adherent Hydrofera Blue. Wound debrided with a #5 curette removing surface debris and eschar. There is still some areas that are not fully epithelialized but there is small. Electronic Signature(s) Signed: 01/29/2019 5:14:46 PM By: Linton Ham MD Entered By: Linton Ham on 01/29/2019 08:46:13 Kenneth Cline ([id number]) -------------------------------------------------------------------------------- Physician Orders Details Patient Name: Kenneth Cline Date of Service: 01/29/2019 8:30 AM Medical Record Number: [id number] Patient Account Number: [id number] Date of Birth/Sex: [birth year] (63 y.o. M) Treating RN: Cornell Barman Primary Care Provider: Jenna Luo Other Clinician: Referring Provider: Jenna Luo Treating Provider/Extender: Tito Dine in Treatment: 7 Verbal / Phone Orders: No Diagnosis Coding Anesthetic (add to Medication List) Wound #2 Right,Distal,Medial Lower Leg o Topical Lidocaine 4% cream applied to wound bed prior to debridement (In Clinic Only). Primary Wound Dressing Wound #2  Right,Distal,Medial Lower Leg o Hydrafera Blue Ready Transfer Secondary Dressing Wound #2 Right,Distal,Medial Lower Leg o Boardered Foam Dressing Dressing Change Frequency Wound #2 Right,Distal,Medial Lower Leg o Change dressing every other day. Follow-up  Appointments Wound #2 Right,Distal,Medial Lower Leg o Return Appointment in 1 week. Edema Control Wound #2 Right,Distal,Medial Lower Leg o Elevate legs to the level of the heart and pump ankles as often as possible Electronic Signature(s) Signed: 01/29/2019 5:14:46 PM By: Linton Ham MD Signed: 01/29/2019 5:19:46 PM By: Gretta Cool, BSN, RN, CWS, Kim RN, BSN Entered By: Gretta Cool, BSN, RN, CWS, Kim on 01/29/2019 08:41:31 Kenneth Cline ([id number]) -------------------------------------------------------------------------------- Problem List Details Patient Name: Kenneth Cline Date of Service: 01/29/2019 8:30 AM Medical Record Number: [id number] Patient Account Number: [id number] Date of Birth/Sex: 1955-12-04 (63 y.o. M) Treating RN: Cornell Barman Primary Care Provider: Jenna Luo Other Clinician: Referring Provider: Jenna Luo Treating Provider/Extender: Tito Dine in Treatment: 7 Active Problems ICD-10 Evaluated Encounter Code Description Active Date Today Diagnosis L97.818 Non-pressure chronic ulcer of other part of right lower leg 12/11/2018 No Yes with other specified severity T81.31XA Disruption of external operation (surgical) wound, not 12/11/2018 No Yes elsewhere classified, initial encounter E11.622 Type 2 diabetes mellitus with other skin ulcer 12/18/2018 No Yes Inactive Problems Resolved Problems Electronic Signature(s) Signed: 01/29/2019 5:14:46 PM By: Linton Ham MD Entered By: Linton Ham on 01/29/2019 08:44:08 Kenneth Cline ([id number]) -------------------------------------------------------------------------------- Progress Note Details Patient Name: Kenneth Cline Date of  Service: 01/29/2019 8:30 AM Medical Record Number: [id number] Patient Account Number: [id number] Date of Birth/Sex: [birth year] (63 y.o. M) Treating RN: Cornell Barman Primary Care Provider: Jenna Luo Other Clinician: Referring Provider: Jenna Luo Treating Provider/Extender: Tito Dine in Treatment: 7 Subjective History of Present Illness (HPI) ADMISSION 12/11/2018 This is a 63 year old man who is a type II diabetic. He apparently developed a "boil" on his right lower leg sometime in mid June. He subsequently went to the beach and was in the ocean. He was admitted to [facility] from 6/20 through 6/30 with septic shock felt to be secondary from cellulitis in the right lower leg. There was concern for compartment syndrome and on 6/20 he underwent a right leg fasciotomy by Dr. Ardeth Sportsman of surgery. He received different courses of antibiotics. As far as I am able to determine the wound had an MSSA infection based on follow-up notes from general surgery but I cannot see the actual culture result. In any case he was discharged on doxycycline. He also had a wound VAC on this for a period of time. He saw general surgery in follow-up on 11/14/2018 and I am able to see this note in [facility] link. He had an "right lower extremity MSSA infection". At that point his wounds were 2 measuring 3 x 4 x 5x2 wound VAC was discontinued at that time and he was put on a wet to dry dressing. He also was given clindamycin for the recurrent wound infection. He was scheduled actually to go to the OR for further debridement although I cannot see that this was actually done. He tells me he went to the wound care center at [facility] on 3 occasions. By the time he was seen at the wound care center on 8 5 there were 3 wounds all requiring debridement. They were dressed with Iodosorb and poly-man. On 8/12 a wound culture was done that showed light Pseudomonas and a few Staphylococcus  although he was not started on antibiotics out of concern for a drug interaction with Coumadin. He actually lives in [place] so he is come to our clinic today for follow-up. 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 His ABIs in our clinic were 1.27 on the right and 1.2 on the left. 8/26; apparently home health was not moistening the Hydrofera Blue classic so it stuck to the wound nevertheless the major wound is down in size. There are 3 open areas the original surgical site and then just laterally to this a more proximal small wound and more distally a larger wound. I did not put him on antibiotics last week has nothing look infected. Still nothing looks infected today 9/2; we have been using Hydrofera Blue. He wants to dismiss home health. We have him in 3 layer compression. His wife apparently is able to do Curlex Coban and has this at home. He is going to [employer] next week and wants a 2-week follow- up. The original surgical wound I think is healed although it has some surface eschar on it. The small superior wound is closed and his major wound is smaller 9/23; using Hydrofera Blue. He was not able to come last week as he presented to the clinic with a fever. Subsequently tested Covid negative. Wound has improved, dimensions are improved 9/30; using Hydrofera Blue. He only has 1 remaining wound which was the distal wound on the medial part of the tibia. The more superior area is close this week 10/7; using Hydrofera Blue under 3 layer compression. Came in today with eschared areas over the remanence of the wound. This was removed he still has some areas that are not fully epithelialized but this is close ELIKAI, ASTBURY L.  ([id number]) Objective Constitutional Sitting or standing Blood Pressure is within target range for patient.. Pulse regular and within target range for patient.Marland Kitchen Respirations regular, non-labored and within target range.. Temperature is normal and within the target range for the patient.Marland Kitchen appears in no distress. Vitals Time Taken: 8:23 AM, Height: 69 in, Weight: 183 lbs, BMI: 27, Temperature: 99.6 F, Pulse: 89 bpm, Respiratory Rate: 16 breaths/min, Blood Pressure: 132/89 mmHg. Cardiovascular Pedal pulses palpable. General Notes: Wound exam; arrived today with a wound eschar and some adherent Hydrofera Blue. Wound debrided with a #5 curette removing surface debris and eschar. There is still some areas that are not fully epithelialized but there is small. Integumentary (Hair, Skin) Wound #2 status is Open. Original cause of wound was Gradually Appeared. The wound is located on the Right,Distal,Medial Lower Leg. The wound measures 1cm length x 0.3cm width x 0.1cm depth; 0.236cm^2 area and 0.024cm^3 volume. There is Fat Layer (Subcutaneous Tissue) Exposed exposed. There is no tunneling or undermining noted. There is a medium amount of serous drainage noted. The wound margin is flat and intact. There is small (1-33%) red granulation within the wound bed. There is a large (67-100%) amount of necrotic tissue within the wound bed including Eschar and Adherent Slough. Assessment Active Problems ICD-10 Non-pressure chronic ulcer of other part of right lower leg with other specified severity Disruption of external operation (surgical) wound, not elsewhere classified, initial encounter Type 2 diabetes mellitus with other skin ulcer Procedures Wound #2 Pre-procedure diagnosis of Wound #2 is a Cellulitis located on the Right,Distal,Medial Lower Leg . There was a Selective/Open Wound Non-Viable Tissue Debridement with a total area of 0.3 sq cm performed by Ricard Dillon, MD. With the following  instrument(s): Curette to remove Non-Viable tissue/material. Material removed includes Eschar and Fibrin/Exudate and after achieving pain control using Lidocaine.  No specimens were taken. A time out was conducted at 08:35, prior to the start of the procedure. A Minimum amount of bleeding was controlled with Pressure. The procedure was tolerated well. Post Debridement Measurements: 1cm length x 0.3cm width x 0.1cm depth; 0.024cm^3 volume. Character of Wound/Ulcer Post Debridement is stable. Post procedure Diagnosis Wound #2: Same as Pre-Procedure BRANTON, ALLENDE. ([id number]) Plan Anesthetic (add to Medication List): Wound #2 Right,Distal,Medial Lower Leg: Topical Lidocaine 4% cream applied to wound bed prior to debridement (In Clinic Only). Primary Wound Dressing: Wound #2 Right,Distal,Medial Lower Leg: Hydrafera Blue Ready Transfer Secondary Dressing: Wound #2 Right,Distal,Medial Lower Leg: Boardered Foam Dressing Dressing Change Frequency: Wound #2 Right,Distal,Medial Lower Leg: Change dressing every other day. Follow-up Appointments: Wound #2 Right,Distal,Medial Lower Leg: Return Appointment in 1 week. Edema Control: Wound #2 Right,Distal,Medial Lower Leg: Elevate legs to the level of the heart and pump ankles as often as possible 1. I am going to continue the [facility] but with a border foam cover. I do not think he needs compression he can change this every second day. Should be closed by next week Electronic Signature(s) Signed: 01/29/2019 5:14:46 PM By: Linton Ham MD Entered By: Linton Ham on 01/29/2019 08:47:12 Kot, [name] ([id number]) -------------------------------------------------------------------------------- SuperBill Details Patient Name: Kenneth Cline Date of Service: 01/29/2019 Medical Record Number: [id number] Patient Account Number: [id number] Date of Birth/Sex: 07/28/1955 (63 y.o. M) Treating RN: Cornell Barman Primary Care Provider: Jenna Luo Other Clinician: Referring Provider: Jenna Luo Treating Provider/Extender: Tito Dine in Treatment: 7 Diagnosis Coding ICD-10 Codes Code Description [phone] Non-pressure chronic ulcer of other part of right lower leg with other specified severity T81.31XA Disruption of external operation (surgical) wound, not elsewhere classified, initial encounter E11.622 Type 2 diabetes mellitus with other skin ulcer Facility Procedures CPT4 Code Description: [id number] - DEBRIDE WOUND 1ST 20 SQ CM OR < ICD-10 Diagnosis Description L97.818 Non-pressure chronic ulcer of other part of right lower leg with o Modifier: ther specified s Quantity: 1 everity Physician Procedures CPT4 Code Description: [id number] - WC PHYS DEBR WO ANESTH 20 SQ CM ICD-10 Diagnosis Description L97.818 Non-pressure chronic ulcer of other part of right lower leg with o Modifier: ther specified s Quantity: 1 everity Electronic Signature(s) Signed: 01/29/2019 5:14:46 PM By: Linton Ham MD Entered By: Linton Ham on 01/29/2019 08:47:29

## 2019-02-05 ENCOUNTER — Other Ambulatory Visit: Payer: Self-pay

## 2019-02-05 ENCOUNTER — Ambulatory Visit (INDEPENDENT_AMBULATORY_CARE_PROVIDER_SITE_OTHER): Payer: Medicare HMO

## 2019-02-05 ENCOUNTER — Encounter: Payer: Medicare HMO | Admitting: Internal Medicine

## 2019-02-05 DIAGNOSIS — L03115 Cellulitis of right lower limb: Secondary | ICD-10-CM | POA: Diagnosis not present

## 2019-02-05 DIAGNOSIS — E11622 Type 2 diabetes mellitus with other skin ulcer: Secondary | ICD-10-CM | POA: Diagnosis not present

## 2019-02-05 DIAGNOSIS — Z7901 Long term (current) use of anticoagulants: Secondary | ICD-10-CM | POA: Diagnosis not present

## 2019-02-05 DIAGNOSIS — D6862 Lupus anticoagulant syndrome: Secondary | ICD-10-CM | POA: Diagnosis not present

## 2019-02-05 DIAGNOSIS — L97819 Non-pressure chronic ulcer of other part of right lower leg with unspecified severity: Secondary | ICD-10-CM | POA: Diagnosis not present

## 2019-02-05 DIAGNOSIS — I10 Essential (primary) hypertension: Secondary | ICD-10-CM | POA: Diagnosis not present

## 2019-02-05 DIAGNOSIS — I823 Embolism and thrombosis of renal vein: Secondary | ICD-10-CM

## 2019-02-05 DIAGNOSIS — Z86718 Personal history of other venous thrombosis and embolism: Secondary | ICD-10-CM | POA: Diagnosis not present

## 2019-02-05 DIAGNOSIS — E039 Hypothyroidism, unspecified: Secondary | ICD-10-CM | POA: Diagnosis not present

## 2019-02-05 DIAGNOSIS — E2740 Unspecified adrenocortical insufficiency: Secondary | ICD-10-CM | POA: Diagnosis not present

## 2019-02-05 LAB — POCT INR: INR: 2.3 (ref 2.0–3.0)

## 2019-02-05 NOTE — Patient Instructions (Signed)
Please continue with 5mg  daily expect for 7.5mg  on Mondays and Fridays.   Recheck INR in 3 weeks.

## 2019-02-06 NOTE — Progress Notes (Signed)
Kenneth Cline, Kenneth Cline ([id number]) Visit Report for 02/05/2019 HPI Details Patient Name: Cline, Kenneth Date of Service: 02/05/2019 8:30 AM Medical Record Number: [id number] Patient Account Number: [id number] Date of Birth/Sex: 01-Jul-1955 (63 y.o. M) Treating RN: Cornell Barman Primary Care Provider: Jenna Luo Other Clinician: Referring Provider: Jenna Luo Treating Provider/Extender: Tito Dine in Treatment: 8 History of Present Illness HPI Description: ADMISSION 12/11/2018 This is a 63 year old man who is a type II diabetic. He apparently developed a "boil" on his right lower leg sometime in mid June. He subsequently went to the beach and was in the ocean. He was admitted to [facility] from 6/20 through 6/30 with septic shock felt to be secondary from cellulitis in the right lower leg. There was concern for compartment syndrome and on 6/20 he underwent a right leg fasciotomy by Dr. Ardeth Sportsman of surgery. He received different courses of antibiotics. As far as I am able to determine the wound had an MSSA infection based on follow-up notes from general surgery but I cannot see the actual culture result. In any case he was discharged on doxycycline. He also had a wound VAC on this for a period of time. He saw general surgery in follow-up on 11/14/2018 and I am able to see this note in [facility] link. He had an "right lower extremity MSSA infection". At that point his wounds were 2 measuring 3 x 4 x 5x2 wound VAC was discontinued at that time and he was put on a wet to dry dressing. He also was given clindamycin for the recurrent wound infection. He was scheduled actually to go to the OR for further debridement although I cannot see that this was actually done. He tells me he went to the wound care center at [facility] on 3 occasions. By the time he was seen at the wound care center on 8 5 there were 3 wounds all requiring debridement. They were dressed with Iodosorb  and poly-man. On 8/12 a wound culture was done that showed light Pseudomonas and a few Staphylococcus although he was not started on antibiotics out of concern for a drug interaction with Coumadin. He actually lives in [place] so he is come to our clinic today for follow-up. 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 His ABIs in our clinic were 1.27 on the right and 1.2 on the left. 8/26; apparently home health was not moistening the Hydrofera Blue classic so it stuck to the wound nevertheless the major wound is down in size. There are 3 open areas the original surgical site and then just laterally to this a more proximal small wound and more distally a larger wound. I did not put him on antibiotics last week has nothing look infected. Still nothing looks infected today 9/2; we have been using Hydrofera Blue. He wants to dismiss home health. We have him in 3 layer compression. His wife apparently is able to do Curlex Coban and has this at home. He is going to [employer] next week and wants a 2-week follow- up. The original surgical wound I think is healed although it has some surface eschar on it. The small superior wound is closed and his major wound is smaller 9/23; using Hydrofera Blue. He was not able to come last week as he presented to the  clinic with a fever. Subsequently tested Covid negative. Wound has improved, dimensions are improved 9/30; using Hydrofera Blue. He only has 1 remaining wound which was the distal wound on the medial part of the tibia. The more superior area is close this week 10/7; using Hydrofera Blue under 3 layer compression. Came in today with eschared areas over the remanence of the wound. This was removed he still  has some areas that are not fully epithelialized but this is close 10/14; the patient had been using Hydrofera Blue under 3 layer compression the patient's major wounds have closed. He still at one scabbed area with a pinhead open area. I thought the patient could be safely discharged at this point Electronic Signature(s) AUGUST, ROGINSKI ([id number]) Signed: 02/05/2019 5:10:21 PM By: Linton Ham MD Entered By: Linton Ham on 02/05/2019 09:21:00 Kenneth Cline ([id number]) -------------------------------------------------------------------------------- Physical Exam Details Patient Name: Kenneth Cline Date of Service: 02/05/2019 8:30 AM Medical Record Number: [id number] Patient Account Number: [id number] Date of Birth/Sex: Sep 06, 1955 (63 y.o. M) Treating RN: Cornell Barman Primary Care Provider: Jenna Luo Other Clinician: Referring Provider: Jenna Luo Treating Provider/Extender: Tito Dine in Treatment: 8 Constitutional Patient is hypertensive.. Pulse regular and within target range for patient.Marland Kitchen Respirations regular, non-labored and within target range.. Temperature is normal and within the target range for the patient.Marland Kitchen appears in no distress. Cardiovascular Pedal pulses palpable and strong bilaterally.. Notes Wound exam; the entire area is epithelialized. He did have 1 pinhead under a scab. I think he can be discharged. No evidence of infection Electronic Signature(s) Signed: 02/05/2019 5:10:21 PM By: Linton Ham MD Entered By: Linton Ham on 02/05/2019 10:29:40 Kenneth Cline ([id number]) -------------------------------------------------------------------------------- Physician Orders Details Patient Name: Kenneth Cline Date of Service: 02/05/2019 8:30 AM Medical Record Number: [id number] Patient Account Number: [id number] Date of Birth/Sex: Sep 29, 1955 (63 y.o. M) Treating RN: Cornell Barman Primary Care Provider: Jenna Luo Other  Clinician: Referring Provider: Jenna Luo Treating Provider/Extender: Tito Dine in Treatment: 8 Verbal / Phone Orders: No Diagnosis Coding Discharge From [facility] o Discharge from [facility] Signature(s) Signed: 02/05/2019 1:48:26 PM By: Gretta Cool, BSN, RN, CWS, Kim RN, BSN Signed: 02/05/2019 5:10:21 PM By: Linton Ham MD Entered By: Gretta Cool, BSN, RN, CWS, Kim on 02/05/2019 08:45:35 Kenneth Cline ([id number]) -------------------------------------------------------------------------------- Problem List Details Patient Name: Kenneth Cline Date of Service: 02/05/2019 8:30 AM Medical Record Number: [id number] Patient Account Number: [id number] Date of Birth/Sex: 10/04/1955 (63 y.o. M) Treating RN: Cornell Barman Primary Care Provider: Jenna Luo Other Clinician: Referring Provider: Jenna Luo Treating Provider/Extender: Tito Dine in Treatment: 8 Active Problems ICD-10 Evaluated Encounter Code Description Active Date Today Diagnosis L97.818 Non-pressure chronic ulcer of other part of right lower leg 12/11/2018 No Yes with other specified severity T81.31XA Disruption of external operation (surgical) wound, not 12/11/2018 No Yes elsewhere classified, initial encounter E11.622 Type 2 diabetes mellitus with other skin ulcer 12/18/2018 No Yes Inactive Problems Resolved Problems Electronic Signature(s) Signed: 02/05/2019 5:10:21 PM By: Linton Ham MD Entered By: Linton Ham on 02/05/2019 10:27:00 Kenneth Cline ([id number]) -------------------------------------------------------------------------------- Progress Note Details Patient Name: Kenneth Cline Date of Service: 02/05/2019 8:30 AM Medical Record Number: [id number] Patient Account Number: [id number] Date of Birth/Sex: 17-Oct-1955 (63 y.o. M) Treating RN: Cornell Barman Primary Care Provider: Jenna Luo Other Clinician: Referring Provider: Jenna Luo Treating Provider/Extender: Tito Dine in Treatment: 8 Subjective History of Present Illness (HPI) ADMISSION 12/11/2018 This is a 63 year old man  who is a type II diabetic. He apparently developed a "boil" on his right lower leg sometime in mid June. He subsequently went to the beach and was in the ocean. He was admitted to [facility] from 6/20 through 6/30 with septic shock felt to be secondary from cellulitis in the right lower leg. There was concern for compartment syndrome and on 6/20 he underwent a right leg fasciotomy by Dr. Ardeth Sportsman of surgery. He received different courses of antibiotics. As far as I am able to determine the wound had an MSSA infection based on follow-up notes from general surgery but I cannot see the actual culture result. In any case he was discharged on doxycycline. He also had a wound VAC on this for a period of time. He saw general surgery in follow-up on 11/14/2018 and I am able to see this note in [facility] link. He had an "right lower extremity MSSA infection". At that point his wounds were 2 measuring 3 x 4 x 5x2 wound VAC was discontinued at that time and he was put on a wet to dry dressing. He also was given clindamycin for the recurrent wound infection. He was scheduled actually to go to the OR for further debridement although I cannot see that this was actually done. He tells me he went to the wound care center at [facility] on 3 occasions. By the time he was seen at the wound care center on 8 5 there were 3 wounds all requiring debridement. They were dressed with Iodosorb and poly-man. On 8/12 a wound culture was done that showed light Pseudomonas and a few Staphylococcus although he was not started on antibiotics out of concern for a drug interaction with Coumadin. He actually lives in [place] so he is come to our clinic today for follow-up. 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 His ABIs in our clinic were 1.27 on the right and 1.2 on the left. 8/26; apparently home health was not moistening the Hydrofera Blue classic so it stuck to the wound nevertheless the major wound is down in size. There are 3 open areas the original surgical site and then just laterally to this a more proximal small wound and more distally a larger wound. I did not put him on antibiotics last week has nothing look infected. Still nothing looks infected today 9/2; we have been using Hydrofera Blue. He wants to dismiss home health. We have him in 3 layer compression. His wife apparently is able to do Curlex Coban and has this at home. He is going to [employer] next week and wants a 2-week follow- up. The original surgical wound I think is healed although it has some surface eschar on it. The small superior wound is closed and his major wound is smaller 9/23; using Hydrofera Blue. He was not able to come last week as he presented to the clinic with a fever. Subsequently tested Covid negative. Wound has improved, dimensions are improved 9/30; using Hydrofera Blue. He only has 1 remaining wound which was the distal wound on the medial part of the tibia. The more superior area is close this week 10/7; using Hydrofera Blue under 3 layer compression. Came in today with eschared areas over the remanence of the wound. This was removed he still has some areas that  are not fully epithelialized but this is close 10/14; the patient had been using Hydrofera Blue under 3 layer compression the patient's major wounds have closed. He still at one scabbed area with a pinhead open area. I thought the patient could be safely discharged at this point Kenneth, Cline.  ([id number]) Objective Constitutional Patient is hypertensive.. Pulse regular and within target range for patient.Marland Kitchen Respirations regular, non-labored and within target range.. Temperature is normal and within the target range for the patient.Marland Kitchen appears in no distress. Vitals Time Taken: 8:36 AM, Height: 69 in, Weight: 183 lbs, BMI: 27, Temperature: 98.5 F, Pulse: 70 bpm, Respiratory Rate: 16 breaths/min, Blood Pressure: 149/87 mmHg. Cardiovascular Pedal pulses palpable and strong bilaterally.. General Notes: Wound exam; the entire area is epithelialized. He did have 1 pinhead under a scab. I think he can be discharged. No evidence of infection Integumentary (Hair, Skin) Wound #2 status is Healed - Epithelialized. Original cause of wound was Gradually Appeared. The wound is located on the Right,Distal,Medial Lower Leg. The wound measures 0cm length x 0cm width x 0cm depth; 0cm^2 area and 0cm^3 volume. There is Fat Layer (Subcutaneous Tissue) Exposed exposed. There is no tunneling or undermining noted. There is a none present amount of drainage noted. The wound margin is flat and intact. There is no granulation within the wound bed. There is a large (67-100%) amount of necrotic tissue within the wound bed including Eschar and Adherent Slough. Assessment Active Problems ICD-10 Non-pressure chronic ulcer of other part of right lower leg with other specified severity Disruption of external operation (surgical) wound, not elsewhere classified, initial encounter Type 2 diabetes mellitus with other skin ulcer Plan Discharge From [facility]: Discharge from [facility] 1. I think the patient can be discharged from the wound care center 2. Advised to keep this area protected for the next 3 to 4 weeks. Electronic Signature(s) Kenneth Cline, Kenneth Cline ([id number]) Signed: 02/05/2019 5:10:21 PM By: Linton Ham MD Entered By: Linton Ham on 02/05/2019 10:30:15 Kenneth Cline  ([id number]) -------------------------------------------------------------------------------- SuperBill Details Patient Name: Kenneth Cline Date of Service: 02/05/2019 Medical Record Number: [id number] Patient Account Number: [id number] Date of Birth/Sex: 06-01-1955 (63 y.o. M) Treating RN: Cornell Barman Primary Care Provider: Jenna Luo Other Clinician: Referring Provider: Jenna Luo Treating Provider/Extender: Tito Dine in Treatment: 8 Diagnosis Coding ICD-10 Codes Code Description [phone] Non-pressure chronic ulcer of other part of right lower leg with other specified severity T81.31XA Disruption of external operation (surgical) wound, not elsewhere classified, initial encounter E11.622 Type 2 diabetes mellitus with other skin ulcer Facility Procedures CPT4 Code: [id number] Description: [phone] - WOUND CARE VISIT-LEV 2 EST PT Modifier: Quantity: 1 Physician Procedures CPT4: Description Modifier Quantity Code [id number] - WC PHYS LEVEL 2 - EST PT 1 ICD-10 Diagnosis Description L97.818 Non-pressure chronic ulcer of other part of right lower leg with other specified severity T81.31XA Disruption of external operation  (surgical) wound, not elsewhere classified, initial encounter E11.622 Type 2 diabetes mellitus with other skin ulcer Electronic Signature(s) Signed: 02/05/2019 5:10:21 PM By: Linton Ham MD Entered By: Linton Ham on 02/05/2019 10:30:33

## 2019-02-06 NOTE — Progress Notes (Signed)
ZYGMUNT, KURKOWSKI ([id number]) Visit Report for 02/05/2019 Arrival Information Details Patient Name: SRIKAR, CONSOLO Date of Service: 02/05/2019 8:30 AM Medical Record Number: [id number] Patient Account Number: [id number] Date of Birth/Sex: Nov 15, 1955 (63 y.o. M) Treating RN: Cornell Barman Primary Care Jeselle Hiser: Jenna Luo Other Clinician: Referring Jahid Weida: Jenna Luo Treating Balbina Depace/Extender: Tito Dine in Treatment: 8 Visit Information History Since Last Visit Added or deleted any medications: No Patient Arrived: Ambulatory Any new allergies or adverse reactions: No Arrival Time: 08:36 Had a fall or experienced change in No Accompanied By: self activities of daily living that may affect Transfer Assistance: None risk of falls: Patient Identification Verified: Yes Signs or symptoms of abuse/neglect since last visito No Secondary Verification Process Yes Hospitalized since last visit: No Completed: Implantable device outside of the clinic excluding No Patient Has Alerts: Yes cellular tissue based products placed in the center Patient Alerts: Patient on Blood since last visit: Thinner Has Dressing in Place as Prescribed: Yes Warfarin Pain Present Now: No ABI L 1.2 R 1.27 Electronic Signature(s) Signed: 02/05/2019 1:48:26 PM By: Gretta Cool, BSN, RN, CWS, Kim RN, BSN Entered By: Gretta Cool, BSN, RN, CWS, Kim on 02/05/2019 08:36:27 [name] ([id number]) -------------------------------------------------------------------------------- Clinic Level of Care Assessment Details Patient Name: [name] Date of Service: 02/05/2019 8:30 AM Medical Record Number: [id number] Patient Account Number: [id number] Date of Birth/Sex: 20-Mar-1956 (63 y.o. M) Treating RN: Cornell Barman Primary Care Sonora Catlin: Jenna Luo Other Clinician: Referring Alexa Blish: Jenna Luo Treating Parveen Freehling/Extender: Tito Dine in Treatment: 8 Clinic Level of Care  Assessment Items TOOL 4 Quantity Score []  - Use when only an EandM is performed on FOLLOW-UP visit 0 ASSESSMENTS - Nursing Assessment / Reassessment []  - Reassessment of Co-morbidities (includes updates in patient status) 0 X- 1 5 Reassessment of Adherence to Treatment Plan ASSESSMENTS - Wound and Skin Assessment / Reassessment X - Simple Wound Assessment / Reassessment - one wound 1 5 []  - 0 Complex Wound Assessment / Reassessment - multiple wounds []  - 0 Dermatologic / Skin Assessment (not related to wound area) ASSESSMENTS - Focused Assessment []  - Circumferential Edema Measurements - multi extremities 0 []  - 0 Nutritional Assessment / Counseling / Intervention []  - 0 Lower Extremity Assessment (monofilament, tuning fork, pulses) []  - 0 Peripheral Arterial Disease Assessment (using hand held doppler) ASSESSMENTS - Ostomy and/or Continence Assessment and Care []  - Incontinence Assessment and Management 0 []  - 0 Ostomy Care Assessment and Management (repouching, etc.) PROCESS - Coordination of Care X - Simple Patient / Family Education for ongoing care 1 15 []  - 0 Complex (extensive) Patient / Family Education for ongoing care []  - 0 Staff obtains Programmer, systems, Records, Test Results / Process Orders []  - 0 Staff telephones HHA, Nursing Homes / Clarify orders / etc []  - 0 Routine Transfer to another Facility (non-emergent condition) []  - 0 Routine Hospital Admission (non-emergent condition) []  - 0 New Admissions / Biomedical engineer / Ordering NPWT, Apligraf, etc. []  - 0 Emergency Hospital Admission (emergent condition) X- 1 10 Simple Discharge Coordination MENZO, BRAAM. ([id number]) []  - 0 Complex (extensive) Discharge Coordination PROCESS - Special Needs []  - Pediatric / Minor Patient Management 0 []  - 0 Isolation Patient Management []  - 0 Hearing / Language / Visual special needs []  - 0 Assessment of Community assistance (transportation, D/C planning,  etc.) []  - 0 Additional assistance / Altered mentation []  - 0 Support Surface(s) Assessment (bed, cushion, seat, etc.) INTERVENTIONS - Wound Cleansing / Measurement X -  Simple Wound Cleansing - one wound 1 5 []  - 0 Complex Wound Cleansing - multiple wounds X- 1 5 Wound Imaging (photographs - any number of wounds) []  - 0 Wound Tracing (instead of photographs) []  - 0 Simple Wound Measurement - one wound []  - 0 Complex Wound Measurement - multiple wounds INTERVENTIONS - Wound Dressings []  - Small Wound Dressing one or multiple wounds 0 []  - 0 Medium Wound Dressing one or multiple wounds []  - 0 Large Wound Dressing one or multiple wounds []  - 0 Application of Medications - topical []  - 0 Application of Medications - injection INTERVENTIONS - Miscellaneous []  - External ear exam 0 []  - 0 Specimen Collection (cultures, biopsies, blood, body fluids, etc.) []  - 0 Specimen(s) / Culture(s) sent or taken to Lab for analysis []  - 0 Patient Transfer (multiple staff / Civil Service fast streamer / Similar devices) []  - 0 Simple Staple / Suture removal (25 or less) []  - 0 Complex Staple / Suture removal (26 or more) []  - 0 Hypo / Hyperglycemic Management (close monitor of Blood Glucose) []  - 0 Ankle / Brachial Index (ABI) - do not check if billed separately X- 1 5 Vital Signs Rodenbeck, Everest L. ([id number]) Has the patient been seen at the hospital within the last three years: Yes Total Score: 50 Level Of Care: New/Established - Level 2 Electronic Signature(s) Signed: 02/05/2019 1:48:26 PM By: Gretta Cool, BSN, RN, CWS, Kim RN, BSN Entered By: Gretta Cool, BSN, RN, CWS, Kim on 02/05/2019 08:46:02 [name] ([id number]) -------------------------------------------------------------------------------- Encounter Discharge Information Details Patient Name: [name] Date of Service: 02/05/2019 8:30 AM Medical Record Number: [id number] Patient Account Number: [id number] Date of Birth/Sex: 05/11/1955 (63  y.o. M) Treating RN: Cornell Barman Primary Care Lori Liew: Jenna Luo Other Clinician: Referring Allee Busk: Jenna Luo Treating Kameryn Davern/Extender: Tito Dine in Treatment: 8 Encounter Discharge Information Items Discharge Condition: Stable Ambulatory Status: Ambulatory Discharge Destination: Home Transportation: Private Auto Accompanied By: self Schedule Follow-up Appointment: No Clinical Summary of Care: Electronic Signature(s) Signed: 02/05/2019 1:48:26 PM By: Gretta Cool, BSN, RN, CWS, Kim RN, BSN Entered By: Gretta Cool, BSN, RN, CWS, Kim on 02/05/2019 08:51:21 [name] ([id number]) -------------------------------------------------------------------------------- Lower Extremity Assessment Details Patient Name: [name] Date of Service: 02/05/2019 8:30 AM Medical Record Number: [id number] Patient Account Number: [id number] Date of Birth/Sex: 04-Apr-1956 (63 y.o. M) Treating RN: Cornell Barman Primary Care Tyrease Vandeberg: Jenna Luo Other Clinician: Referring Alicen Donalson: Jenna Luo Treating Airam Heidecker/Extender: Tito Dine in Treatment: 8 Vascular Assessment Pulses: Dorsalis Pedis Palpable: [Right:Yes] Electronic Signature(s) Signed: 02/05/2019 1:48:26 PM By: Gretta Cool, BSN, RN, CWS, Kim RN, BSN Entered By: Gretta Cool, BSN, RN, CWS, Kim on 02/05/2019 08:39:50 [name] ([id number]) -------------------------------------------------------------------------------- Multi Wound Chart Details Patient Name: [name] Date of Service: 02/05/2019 8:30 AM Medical Record Number: [id number] Patient Account Number: [id number] Date of Birth/Sex: 06/17/1955 (63 y.o. M) Treating RN: Cornell Barman Primary Care Loran Fleet: Jenna Luo Other Clinician: Referring Liberti Appleton: Jenna Luo Treating Timberlee Roblero/Extender: Tito Dine in Treatment: 8 Vital Signs Height(in): 69 Pulse(bpm): 70 Weight(lbs): 183 Blood Pressure(mmHg): 149/87 Body Mass  Index(BMI): 27 Temperature(F): 98.5 Respiratory Rate 16 (breaths/min): Photos: [[name]:[name]] Wound Location: Right, Distal, Medial Lower [name] [name] Leg Wounding Event: Gradually Appeared [name] [name] Primary Etiology: Cellulitis [name] [name] Comorbid History: Type II Diabetes [name] [name] Date Acquired: 10/07/2018 [name] [name] Weeks of Treatment: 8 [name] [name] Wound Status: Healed - Epithelialized [name] [name] Measurements L x W x D 0x0x0 [name] [name] (cm) Area (cm) : 0 [name]  [name] Volume (cm) : 0 [name] [name] % Reduction in Area: 100.00% [name] [name] % Reduction in Volume: 100.00% [name] [name] Classification: Full Thickness Without [name] [name] Exposed Support Structures Exudate Amount: None Present [name] [name] Wound Margin: Flat and Intact [name] [name] Granulation Amount: None Present (0%) [name] [name] Necrotic Amount: Large (67-100%) [name] [name] Necrotic Tissue: Eschar, Adherent Slough [name] [name] Exposed Structures: Fat Layer (Subcutaneous [name] [name] Tissue) Exposed: Yes Fascia: No Tendon: No Muscle: No Joint: No Bone: No Epithelialization: Large (67-100%) [name] [name] RUSHTON, DIVAN ([id number]) Treatment Notes Electronic Signature(s) Signed: 02/05/2019 5:10:21 PM By: Linton Ham MD Entered By: Linton Ham on 02/05/2019 10:27:08 [name] ([id number]) -------------------------------------------------------------------------------- Multi-Disciplinary Care Plan Details Patient Name: [name] Date of Service: 02/05/2019 8:30 AM Medical Record Number: [id number] Patient Account Number: [id number] Date of Birth/Sex: [birth year] (63 y.o. M) Treating RN: Cornell Barman Primary Care Eber Ferrufino: Jenna Luo Other Clinician: Referring Lilly Gasser: Jenna Luo Treating Hardeep Reetz/Extender: Tito Dine in Treatment: 8 Active Inactive Electronic Signature(s) Signed: 02/05/2019 1:48:26 PM By: Gretta Cool, BSN, RN, CWS, Kim RN, BSN Entered By: Gretta Cool, BSN, RN, CWS, Kim on 02/05/2019 08:44:11 [name]  ([id number]) -------------------------------------------------------------------------------- Pain Assessment Details Patient Name: [name] Date of Service: 02/05/2019 8:30 AM Medical Record Number: [id number] Patient Account Number: [id number] Date of Birth/Sex: Apr 11, 1956 (63 y.o. M) Treating RN: Cornell Barman Primary Care Denielle Bayard: Jenna Luo Other Clinician: Referring Bayne Fosnaugh: Jenna Luo Treating Ailyne Pawley/Extender: Tito Dine in Treatment: 8 Active Problems Location of Pain Severity and Description of Pain Patient Has Paino No Site Locations Pain Management and Medication Current Pain Management: Goals for Pain Management Patient denies pain at this time. Electronic Signature(s) Signed: 02/05/2019 1:48:26 PM By: Gretta Cool, BSN, RN, CWS, Kim RN, BSN Entered By: Gretta Cool, BSN, RN, CWS, Kim on 02/05/2019 08:36:43 [name] ([id number]) -------------------------------------------------------------------------------- Patient/Caregiver Education Details Patient Name: [name] Date of Service: 02/05/2019 8:30 AM Medical Record Number: [id number] Patient Account Number: [id number] Date of Birth/Gender: [birth year] (63 y.o. M) Treating RN: Cornell Barman Primary Care Physician: Jenna Luo Other Clinician: Referring Physician: Jenna Luo Treating Physician/Extender: Tito Dine in Treatment: 8 Education Assessment Education Provided To: Patient Education Topics Provided Wound/Skin Impairment: Handouts: Caring for Your Ulcer, Other: moisturize Methods: Science writer) Signed: 02/05/2019 1:48:26 PM By: Gretta Cool, BSN, RN, CWS, Kim RN, BSN Entered By: Gretta Cool, BSN, RN, CWS, Kim on 02/05/2019 08:46:22 [name] ([id number]) -------------------------------------------------------------------------------- Wound Assessment Details Patient Name: [name] Date of Service: 02/05/2019 8:30 AM Medical Record  Number: [id number] Patient Account Number: [id number] Date of Birth/Sex: 1955-10-31 (63 y.o. M) Treating RN: Cornell Barman Primary Care Bryndan Bilyk: Jenna Luo Other Clinician: Referring Quantia Grullon: Jenna Luo Treating Brandell Maready/Extender: Tito Dine in Treatment: 8 Wound Status Wound Number: 2 Primary Etiology: Cellulitis Wound Location: Right, Distal, Medial Lower Leg Wound Status: Healed - Epithelialized Wounding Event: Gradually Appeared Comorbid History: Type II Diabetes Date Acquired: 10/07/2018 Weeks Of Treatment: 8 Clustered Wound: No Photos Wound Measurements Length: (cm) 0 % Re Width: (cm) 0 % Re Depth: (cm) 0 Epit Area: (cm) 0 Tun Volume: (cm) 0 Und duction in Area: 100% duction in Volume: 100% helialization: Large (67-100%) neling: No ermining: No Wound Description Full Thickness Without Exposed Support Classification: Structures Wound Margin: Flat and Intact Exudate None Present Amount: Foul Odor After Cleansing: No Slough/Fibrino No Wound Bed Granulation Amount: None Present (0%) Exposed Structure Necrotic Amount: Large (67-100%) Fascia Exposed: No Necrotic Quality: Eschar, Adherent Slough Fat Layer (  Subcutaneous Tissue) Exposed: Yes Tendon Exposed: No Muscle Exposed: No Joint Exposed: No Bone Exposed: No Electronic Signature(s) Signed: 02/05/2019 1:48:26 PM By: Gretta Cool, BSN, RN, CWS, Kim RN, BSN D[place]o, [place] ([id number]) Entered By: Gretta Cool, BSN, RN, CWS, Kim on 02/05/2019 08:43:55 [name] ([id number]) -------------------------------------------------------------------------------- [place] Details Patient Name: [name] Date of Service: 02/05/2019 8:30 AM Medical Record Number: [id number] Patient Account Number: [id number] Date of Birth/Sex: Feb 06, 1956 (63 y.o. M) Treating RN: Cornell Barman Primary Care Remmy Crass: Jenna Luo Other Clinician: Referring Page Pucciarelli: Jenna Luo Treating Nalda Shackleford/Extender: Tito Dine in Treatment: 8 Vital Signs Time Taken: 08:36 Temperature (F): 98.5 Height (in): 69 Pulse (bpm): 70 Weight (lbs): 183 Respiratory Rate (breaths/min): 16 Body Mass Index (BMI): 27 Blood Pressure (mmHg): 149/87 Reference Range: 80 - 120 mg / dl Electronic Signature(s) Signed: 02/05/2019 1:48:26 PM By: Gretta Cool, BSN, RN, CWS, Kim RN, BSN Entered By: Gretta Cool, BSN, RN, CWS, Kim on 02/05/2019 08:37:08

## 2019-02-11 ENCOUNTER — Other Ambulatory Visit: Payer: Self-pay

## 2019-02-11 ENCOUNTER — Encounter: Payer: Self-pay | Admitting: Cardiology

## 2019-02-11 ENCOUNTER — Ambulatory Visit: Payer: Medicare HMO | Admitting: Cardiology

## 2019-02-11 VITALS — BP 118/84 | HR 97 | Ht 68.0 in | Wt 185.4 lb

## 2019-02-11 DIAGNOSIS — Z7901 Long term (current) use of anticoagulants: Secondary | ICD-10-CM | POA: Diagnosis not present

## 2019-02-11 DIAGNOSIS — I823 Embolism and thrombosis of renal vein: Secondary | ICD-10-CM | POA: Diagnosis not present

## 2019-02-11 DIAGNOSIS — Z8249 Family history of ischemic heart disease and other diseases of the circulatory system: Secondary | ICD-10-CM

## 2019-02-11 DIAGNOSIS — I10 Essential (primary) hypertension: Secondary | ICD-10-CM | POA: Diagnosis not present

## 2019-02-11 DIAGNOSIS — E785 Hyperlipidemia, unspecified: Secondary | ICD-10-CM

## 2019-02-11 MED ORDER — EZETIMIBE 10 MG PO TABS: 10.0000 mg | ORAL_TABLET | Freq: Every day | ORAL | 3 refills | Status: DC

## 2019-02-11 NOTE — Patient Instructions (Signed)
Medication Instructions:  NO CHANGES   *If you need a refill on your cardiac medications before your next appointment, please call your pharmacy*  Lab Work: LIPID - FASTING CMP YOU MAY DO LABS HER AT OFFICE OR PRIMARY OFFICE   If you have labs (blood work) drawn today and your tests are completely normal, you will receive your results only by: Marland Kitchen MyChart Message (if you have MyChart) OR . A paper copy in the mail If you have any lab test that is abnormal or we need to change your treatment, we will call you to review the results.  Testing/Procedures: NOT NEEDED  Follow-Up: At [facility], you and your health needs are our priority.  As part of our continuing mission to provide you with exceptional heart care, we have created designated Provider Care Teams.  These Care Teams include your primary Cardiologist (physician) and Advanced Practice Providers (APPs -  Physician Assistants and Nurse Practitioners) who all work together to provide you with the care you need, when you need it.  Your next appointment:   12 months- OCT 2021--- You will receive a reminder letter in the mail two months in advance. If you don't receive a letter, please call our office to schedule the follow-up appointment.  The format for your next appointment:   In Person  Provider:   Dr Ellyn Hack  Other Instructions

## 2019-02-11 NOTE — Progress Notes (Signed)
PCP: Susy Frizzle, MD  Clinic Note: Chief Complaint  Patient presents with  . Follow-up    Recent hospitalization    HPI:   Kenneth Cline is a 63 y.o. male with PMH notable for HTN/HLD & Lupus Anticoagulant (on warfarin) with strong Fam Hx of CAD who presents today for delayed annual f/u. Marland Kitchen  Jaysin Gayler was last seen in July 2019.  He was doing relatively well overall from a cardiac standpoint.  Was scheduled for possible redo ERCP shortly after that visit.  But otherwise had spent almost entire year out of the hospital.  Recent Hospitalizations:   June 20th was admitted to [facility] --was noted to have pneumonia, right leg abscess (MSSA SSTI - septic shock) with adrenal insufficiency.  Began as a "blood blister that got infected".  Condition deteriorated due to adrenal crisis and septic shock requiring intubation.  ->  Required fasciotomy for necrotizing fasciitis on June 20--end up with wound VAC.  Reviewed  CV studies:   The following studies were reviewed today: (if available, images/films reviewed: From Epic Chart or Care Everywhere) . No new studies   Interval History:   Alyse Low is doing relatively well overall from a cardiac standpoint.  He really is not all that active, but he does do work around the house.  Through the COVID-19 lockdown stages he has been very careful about social distancing and staying safe.  He does not have any symptoms of angina or heart failure nor does he have any irregular heartbeat palpitation symptoms.  Cardiovascular review of symptoms: no chest pain or dyspnea on exertion positive for - easy bruising, still recovering Energy level from June hospitalization negative for - dyspnea on exertion, edema, irregular heartbeat, orthopnea, palpitations, paroxysmal nocturnal dyspnea, rapid heart rate, shortness of breath or syncope / near syncope; TIA/ amaurosis fugax  The patient does not have symptoms concerning for COVID-19  infection (fever, chills, cough, or new shortness of breath).  The patient is practicing social distancing. +++ Masking.  Doesn't really go Groceries/shopping much.     REVIEWED OF SYSTEMS   ROS: A comprehensive was performed. Review of Systems  Constitutional: Negative for malaise/fatigue (Does not have the greatest energy level, but is not noticing excessive fatigue.).  HENT: Negative for congestion and nosebleeds.   Respiratory: Negative for cough, shortness of breath and wheezing.   Cardiovascular: Negative for leg swelling.  Gastrointestinal: Negative for blood in stool, constipation, diarrhea, heartburn, melena and nausea.  Genitourinary: Negative for hematuria.  Musculoskeletal: Positive for joint pain and myalgias. Negative for falls.  Neurological: Positive for dizziness (Occasionally has positional dizziness.). Negative for focal weakness and weakness.  Psychiatric/Behavioral: Positive for memory loss (Still a bit of memory loss). Negative for depression. The patient is not nervous/anxious and does not have insomnia.   All other systems reviewed and are negative.  I have reviewed and (if needed) personally updated the patient's problem list, medications, allergies, past medical and surgical history, social and family history.   PAST MEDICAL HISTORY   Past Medical History:  Diagnosis Date  . Adrenal hemorrhage [facility]) April 2016   Following shoulder surgery  . Adrenal insufficiency, primary, hemorrhagic ([place]) 07/2014   due to bilateral adrenal hemorrhage  . Cellulitis 09/2018   septic shock, compartment syndrome s/p fasciotomy RLE at [facility]  . CVA (cerebral infarction) January 2017   3 small areas of infarct in both the cerebrum as well as the cerebellum -- , located with diplopia  .  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 Dyslipidemia   . Erectile dysfunction   . Essential hypertension   . Family history of premature coronary artery disease     2 brothers, one sister and father all with CAD.  One sister with valve disease.  Marland Kitchen GERD (gastroesophageal reflux disease)   . H/O acute cholangitis May 2952   Complicated by septic shock, acute pancreatitis and acute choledocholithiasis;   . Lupus anticoagulant with hemorrhagic disorder (HCC)    Previously on Xarelto. Now on warfarin  . Overweight (BMI 25.0-29.9)   . PSC (primary sclerosing cholangitis)    managed by Dr. Koleen Nimrod at [facility]  . Recurrent cholangitis 7/'16, 10/'16, 11/'16 & 2/'17   Initial ERCP with biliary stent placement followed by cholecystectomy forr cholecystitis- seen at [facility] and [place] felt to be [place]  . Septic shock due to Escherichia coli [facility]) October 2016; January 2017   Escherichia coli bacteremia secondary to recurrent cholangitis -- status post ERCP  . Stevens-Johnson disease ([place]) 04/08/2013    Reaction to Biaxin  . 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 Type 2 diabetes mellitus [facility]) February 2017     PAST SURGICAL HISTORY   Past Surgical History:  Procedure Laterality Date  . CPET/MET  11/27/2011   normal PFT, good effort-no ischemia burden  . ERCP W/ METAL STENT PLACEMENT  10/'16; 11/'16   [facility] - stent removal  . ERCP W/ METAL STENT PLACEMENT  06/01/2015   [place]. Likely cholangitis  . ERCP W/ PLASTIC STENT PLACEMENT  Sep 09 2014   [facility] - Dr. Alvera Novel  . ERCP W/ SPHINCTEROTOMY AND BALLOON DILATION  11/10/2014   [facility] -- Removal of biliary stent,, to PACU duodenitis  . LAPAROSCOPIC CHOLECYSTECTOMY W/ CHOLANGIOGRAPHY  November 13 2014   [facility], Dr. Devota Pace  . NM MYOCAR PERF WALL MOTION  06/28/2006   EF 84% , LV systolic fx norm.  Marland Kitchen ROTATOR CUFF REPAIR    . TRANSTHORACIC ECHOCARDIOGRAM  04/2015   No pericardial effusion. No shunt. Full study: Basal septal LVH. Pseudo-normal filling (GR 2 DD). EF 57%.  . TRANSTHORACIC ECHOCARDIOGRAM   08/2015   [facility]): Normal LV size. Basal septal hypertrophy. EF 57%. Pseudo-normal filling. Mild focal aortic valve thickening. (Study was done to evaluate for possible endocarditis)     MEDICATIONS/ALLERGIES   Current Meds  Medication Sig  . aspirin EC 81 MG tablet Take 81 mg by mouth as directed.  . Calcium Carb-Cholecalciferol (CALCIUM + VITAMIN D3 PO) Take by mouth as directed.  . enoxaparin (LOVENOX) 80 MG/0.8ML injection Inject 0.8 mLs (80 mg total) into the skin every 12 (twelve) hours.  Marland Kitchen ezetimibe (ZETIA) 10 MG tablet Take 1 tablet (10 mg total) by mouth daily.  . ferrous sulfate 325 (65 FE) MG tablet Take 325 mg by mouth daily with breakfast.  . fludrocortisone (FLORINEF) 0.1 MG tablet Take 0.1 mg by mouth daily.  Marland Kitchen gabapentin (NEURONTIN) 300 MG capsule TAKE 1 CAPSULE THREE TIMES A DAY FOR 30 DAYS. MAY INCREASE TO UP TO 6 CAPSULES A DAY  . HYDROcodone-acetaminophen (NORCO) 5-325 MG tablet Take 1 tablet by mouth every 6 (six) hours as needed for moderate pain.  Marland Kitchen ipratropium (ATROVENT) 0.06 % nasal spray 1-2 SPRAYS IN NOSTRILS 3 (THREE) TIMES DAILY AS NEEDED FOR RHINITIS (RUNNY NOSE/POST NASAL DRIP).  Marland Kitchen JANUVIA 100 MG tablet TAKE  1 TABLET BY MOUTH EVERY DAY  . levothyroxine (SYNTHROID, LEVOTHROID) 75 MCG tablet Take 37.5 mcg by mouth daily.  . magnesium oxide (MAG-OX) 400 MG tablet Take 400 mg by mouth daily.  . metFORMIN (GLUCOPHAGE-XR) 500 MG 24 hr tablet Take 1 tablet by mouth 2 (two) times daily.  . mometasone (NASONEX) 50 MCG/ACT nasal spray Place 2 sprays into the nose daily.  Marland Kitchen oxyCODONE-acetaminophen (PERCOCET) 5-325 MG tablet Take 1-2 tablets by mouth every 6 (six) hours as needed for severe pain.  . pantoprazole (PROTONIX) 40 MG tablet TAKE 1 TABLET BY MOUTH TWICE A DAY  . predniSONE (DELTASONE) 5 MG tablet Take 5 mg by mouth as directed.  . Probiotic Product (PROBIOTIC DAILY PO) Take by mouth.  . sertraline (ZOLOFT) 50 MG tablet TAKE 1 TABLET BY MOUTH EVERY DAY  .  traMADol (ULTRAM) 50 MG tablet Take 1 tablet (50 mg total) by mouth every 12 (twelve) hours as needed for moderate pain.  . ursodiol (ACTIGALL) 500 MG tablet Take 500 mg by mouth 2 (two) times daily.  Marland Kitchen warfarin (COUMADIN) 5 MG tablet TAKE 1 TO 1.5 TABLETS BY MOUTH DAILY AS DIRECTED BY COUMADIN CLINIC  . [DISCONTINUED] ezetimibe (ZETIA) 10 MG tablet TAKE 1 TABLET (10 MG TOTAL) BY MOUTH DAILY. NEEDS OFFICE VISIT FOR FURTHER REFILLS    Allergies  Allergen Reactions  . Biaxin [Clarithromycin] Rash    SJS  . Keflex [Cephalexin] Hives, Itching and Rash  . Erythromycin     Was told not to take d/t medical problems  . Macrolides And Ketolides Other (See Comments)    Unknown  . Penicillins     REACTION: rash     SOCIAL HISTORY/FAMILY HISTORY   Social History   Tobacco Use  . Smoking status: Never Smoker  . Smokeless tobacco: Never Used  Substance Use Topics  . Alcohol use: Yes    Alcohol/week: 3.0 standard drinks    Types: 3 Standard drinks or equivalent per week    Comment: occassional  . Drug use: No   Social History   Social History Narrative   He is a married father of 2 with 3 stepchildren.  He has 3 grandchildren his own and 3 step grandchildren.  He works as a Dealer for [employer].  He lives with his wife, [name], of 8 years.  He does not, and never did smoke.  He takes occasional alcohol beverage but nothing significant.  He does not do routine exercise, but does walk a lot at work.    family history includes Heart attack (age of onset: 29) in his brother; Heart attack (age of onset: 57) in his father; Heart disease (age of onset: 33) in his sister; Heart disease (age of onset: 19) in his brother; Heart disease (age of onset: 36) in his sister; Heart failure in his sister; Hypertension in his brother, brother, sister, and sister; Ovarian cancer in his maternal grandmother; Sudden death in his brother; Sudden death (age of onset: 87) in his father.   OBJCTIVE -PE, EKG, labs    Wt Readings from Last 3 Encounters:  02/11/19 185 lb 6.4 oz (84.1 kg)  06/03/18 191 lb (86.6 kg)  11/09/17 191 lb 8 oz (86.9 kg)    Physical Exam: BP 118/84   Pulse 97   Ht _0  (1.727 m)   Wt 185 lb 6.4 oz (84.1 kg)   BMI 28.19 kg/m  Physical Exam  Constitutional: He is oriented to person, place, and time. He appears well-developed and  well-nourished. No distress.  Healthy appearing  HENT:  Head: Normocephalic and atraumatic.  Neck: Normal range of motion. Neck supple. No JVD present.  Cardiovascular: Normal rate, regular rhythm, normal heart sounds and intact distal pulses.  No extrasystoles are present. PMI is not displaced. Exam reveals no gallop and no friction rub.  No murmur heard. Pulmonary/Chest: Effort normal and breath sounds normal. No respiratory distress. He has no wheezes. He has no rales. He exhibits no tenderness.  Abdominal: Soft. Bowel sounds are normal. He exhibits no distension. There is no abdominal tenderness. There is no rebound.  No obvious HSM.  Musculoskeletal: Normal range of motion.        General: No edema.  Neurological: He is alert and oriented to person, place, and time.  Skin:  Diffuse upper arm ecchymoses.  L forearm has wraps in 2 locations - due to scratch.  L inner thigh - surgical would & blister site healing slowly - no erythema or swelling.  Non-tender & not warm.  Psychiatric: He has a normal mood and affect. His behavior is normal. Judgment and thought content normal.  Vitals reviewed.   Adult ECG Report  Rate: 97 ;  Rhythm: normal sinus rhythm and normal axis, intervals, & durations;   Narrative Interpretation: normal  Recent Labs:   Lab Results  Component Value Date   CHOL 226 (H) 04/30/2017   HDL 49 04/30/2017   LDLCALC 144 (H) 04/30/2017   TRIG 191 (H) 04/30/2017   CHOLHDL 4.6 04/30/2017   Lab Results  Component Value Date   CREATININE 1.04 04/30/2017   BUN 27 (H) 04/30/2017   NA 138 04/30/2017   K 4.0 04/30/2017   CL  104 04/30/2017   CO2 23 04/30/2017   Lab Results  Component Value Date   CHOL 226 (H) 04/30/2017   HDL 49 04/30/2017   LDLCALC 144 (H) 04/30/2017   TRIG 191 (H) 04/30/2017   CHOLHDL 4.6 04/30/2017    ASSESSMENT/PLAN    Problem List Items Addressed This Visit    Hypertension, essential, benign (Chronic)    Well-controlled blood pressure.  Currently not on any medications.      Relevant Medications   ezetimibe (ZETIA) 10 MG tablet   Other Relevant Orders   EKG 12-Lead (Completed)   Dyslipidemia (Chronic)    I have not seen lipids checked in quite some time.  Target LDL should be less than 100.  We have not used statins because of his liver issues with recurrent cholangitis etc.  Zetia should not have an effect here - therefore it was restarted.  Labs should be due for follow-up in January.  We will order chemistry panel on the.  Limited options for treatment going forward other than Zetia.  Could consider Questran versus more aggressive measures if LDL levels go up..      Relevant Medications   ezetimibe (ZETIA) 10 MG tablet   Other Relevant Orders   Lipid panel   Comprehensive metabolic panel   Family history of premature coronary artery disease (Chronic)    No active anginal symptoms. Normal blood pressure therefore not on beta-blocker or ACE inhibitor/ARB.  If pressures were elevated, would recommend ACE inhibitor or ARB because of diabetes.  Lipids have been difficult to manage because of his liver issues.  Return to avoid statins and other medications besides Zetia.  Zetia has been stopped and now back on.      Relevant Orders   EKG 12-Lead (Completed)   Comprehensive metabolic panel  Long term (current) use of anticoagulants (Chronic)    Remains on warfarin.  No bleeding issues.      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      Relevant Medications   ezetimibe (ZETIA) 10 MG tablet      COVID-19 Education: The signs and symptoms of COVID-19 were discussed with the patient and how to seek care for testing (follow up with PCP or arrange E-visit).   The importance of social distancing was discussed today.   I spent a total of 15 minutes with the patient and chart review. >  50% of the time was spent in direct patient consultation.  Additional time spent with chart review (studies, outside notes, etc): 5 Total Time: 20 min   Current medicines are reviewed at length with the patient today.  (+/- concerns) n/a   Patient Instructions / Medication Changes & Studies & Tests Ordered   Patient Instructions  Medication Instructions:  NO CHANGES   *If you need a refill on your cardiac medications before your next appointment, please call your pharmacy*  Lab Work: LIPID - FASTING CMP YOU MAY DO LABS HER AT OFFICE OR PRIMARY OFFICE   If you have labs (blood work) drawn today and your tests are completely normal, you will receive your results only by: Marland Kitchen MyChart Message (if you have MyChart) OR . A paper copy in the mail If you have any lab test that is abnormal or we need to change your treatment, we will call you to review the results.  Testing/Procedures: NOT NEEDED  Follow-Up: At [facility], you and your health needs are our priority.  As part of our continuing mission to provide you with exceptional heart care, we have created designated Provider Care Teams.  These Care Teams include your primary Cardiologist (physician) and Advanced Practice Providers (APPs -  Physician Assistants and Nurse Practitioners) who all work together to provide you with the care you need, when you need it.  Your next appointment:   12 months- OCT 2021--- You will receive a reminder letter in the mail two months in advance. If you don't receive a letter, please call our office to schedule the follow-up  appointment.  The format for your next appointment:   In Person  Provider:   Dr Kortnie Stovall  Other Instructions    Studies Ordered:   Orders Placed This Encounter  Procedures  . Lipid panel  . Comprehensive metabolic panel  . EKG 12-Lead     Glenetta Hew, M.D., M.S. Interventional Cardiologist   Pager # [phone] Phone # [phone] [address] [place], [place] [address]   Thank you for choosing Heartcare at [facility]!!

## 2019-02-12 ENCOUNTER — Encounter: Payer: Medicare HMO | Admitting: Internal Medicine

## 2019-02-13 ENCOUNTER — Encounter: Payer: Self-pay | Admitting: Cardiology

## 2019-02-13 NOTE — Assessment & Plan Note (Signed)
Remains on warfarin.  No bleeding issues.

## 2019-02-13 NOTE — Assessment & Plan Note (Signed)
History of lupus anticoagulant -on chronic oral anticoagulation with warfarin.Kenneth Cline

## 2019-02-13 NOTE — Assessment & Plan Note (Signed)
I have not seen lipids checked in quite some time.  Target LDL should be less than 100.  We have not used statins because of his liver issues with recurrent cholangitis etc.  Zetia should not have an effect here - therefore it was restarted.  Labs should be due for follow-up in January.  We will order chemistry panel on the.  Limited options for treatment going forward other than Zetia.  Could consider Questran versus more aggressive measures if LDL levels go up.Marland Kitchen

## 2019-02-13 NOTE — Assessment & Plan Note (Addendum)
Well-controlled blood pressure.  Currently not on any medications.

## 2019-02-13 NOTE — Assessment & Plan Note (Signed)
No active anginal symptoms. Normal blood pressure therefore not on beta-blocker or ACE inhibitor/ARB.  If pressures were elevated, would recommend ACE inhibitor or ARB because of diabetes.  Lipids have been difficult to manage because of his liver issues.  Return to avoid statins and other medications besides Zetia.  Zetia has been stopped and now back on.

## 2019-02-17 DIAGNOSIS — M5416 Radiculopathy, lumbar region: Secondary | ICD-10-CM | POA: Diagnosis not present

## 2019-02-17 DIAGNOSIS — M5126 Other intervertebral disc displacement, lumbar region: Secondary | ICD-10-CM | POA: Diagnosis not present

## 2019-02-25 ENCOUNTER — Telehealth: Payer: Self-pay | Admitting: Cardiology

## 2019-02-25 NOTE — Telephone Encounter (Signed)
Patient instructed to monitor temp and symptoms for 24 hours. Will cancel appt if symptoms not resolved today.

## 2019-02-25 NOTE — Telephone Encounter (Signed)
New Message     Pt is calling and says he is having some congestion, no fever.  He is wondering if it is okay he comes to his coumadin appt tomorrow     Please call back

## 2019-02-25 NOTE — Telephone Encounter (Signed)
Spoke with patient who reports congestion, some cough - symptoms improve with mucinex. He has had no known COVID exposure nor has been around anyone who has COVID or is undergoing testing for COVID.   Routed to CVRR to advise if he SHOULD NOT come to INR check tomorrow

## 2019-02-26 ENCOUNTER — Other Ambulatory Visit: Payer: Self-pay

## 2019-02-26 ENCOUNTER — Ambulatory Visit (INDEPENDENT_AMBULATORY_CARE_PROVIDER_SITE_OTHER): Payer: Medicare HMO | Admitting: Pharmacist

## 2019-02-26 DIAGNOSIS — I823 Embolism and thrombosis of renal vein: Secondary | ICD-10-CM

## 2019-02-26 DIAGNOSIS — Z20822 Contact with and (suspected) exposure to covid-19: Secondary | ICD-10-CM

## 2019-02-26 DIAGNOSIS — Z7901 Long term (current) use of anticoagulants: Secondary | ICD-10-CM

## 2019-02-26 LAB — POCT INR: INR: 1.9 — AB (ref 2.0–3.0)

## 2019-02-27 LAB — NOVEL CORONAVIRUS, NAA: SARS-CoV-2, NAA: DETECTED — AB

## 2019-03-04 DIAGNOSIS — U071 COVID-19: Secondary | ICD-10-CM | POA: Diagnosis not present

## 2019-03-04 DIAGNOSIS — R0600 Dyspnea, unspecified: Secondary | ICD-10-CM | POA: Diagnosis not present

## 2019-03-04 DIAGNOSIS — E871 Hypo-osmolality and hyponatremia: Secondary | ICD-10-CM | POA: Diagnosis not present

## 2019-03-04 DIAGNOSIS — E785 Hyperlipidemia, unspecified: Secondary | ICD-10-CM | POA: Diagnosis not present

## 2019-03-04 DIAGNOSIS — R05 Cough: Secondary | ICD-10-CM | POA: Diagnosis not present

## 2019-03-04 DIAGNOSIS — R69 Illness, unspecified: Secondary | ICD-10-CM | POA: Diagnosis not present

## 2019-03-04 DIAGNOSIS — R6520 Severe sepsis without septic shock: Secondary | ICD-10-CM | POA: Diagnosis not present

## 2019-03-04 DIAGNOSIS — Z7984 Long term (current) use of oral hypoglycemic drugs: Secondary | ICD-10-CM | POA: Diagnosis not present

## 2019-03-04 DIAGNOSIS — J1289 Other viral pneumonia: Secondary | ICD-10-CM | POA: Diagnosis not present

## 2019-03-04 DIAGNOSIS — E271 Primary adrenocortical insufficiency: Secondary | ICD-10-CM | POA: Diagnosis not present

## 2019-03-04 DIAGNOSIS — Z7901 Long term (current) use of anticoagulants: Secondary | ICD-10-CM | POA: Diagnosis not present

## 2019-03-04 DIAGNOSIS — A4189 Other specified sepsis: Secondary | ICD-10-CM | POA: Diagnosis not present

## 2019-03-04 DIAGNOSIS — B9729 Other coronavirus as the cause of diseases classified elsewhere: Secondary | ICD-10-CM | POA: Diagnosis not present

## 2019-03-04 DIAGNOSIS — E875 Hyperkalemia: Secondary | ICD-10-CM | POA: Diagnosis not present

## 2019-03-04 DIAGNOSIS — E063 Autoimmune thyroiditis: Secondary | ICD-10-CM | POA: Diagnosis not present

## 2019-03-04 DIAGNOSIS — E1165 Type 2 diabetes mellitus with hyperglycemia: Secondary | ICD-10-CM | POA: Diagnosis not present

## 2019-03-04 DIAGNOSIS — J9601 Acute respiratory failure with hypoxia: Secondary | ICD-10-CM | POA: Diagnosis not present

## 2019-03-04 DIAGNOSIS — R509 Fever, unspecified: Secondary | ICD-10-CM | POA: Diagnosis not present

## 2019-03-04 DIAGNOSIS — Z9981 Dependence on supplemental oxygen: Secondary | ICD-10-CM | POA: Diagnosis not present

## 2019-03-04 DIAGNOSIS — D6861 Antiphospholipid syndrome: Secondary | ICD-10-CM | POA: Diagnosis not present

## 2019-03-04 DIAGNOSIS — J189 Pneumonia, unspecified organism: Secondary | ICD-10-CM | POA: Diagnosis not present

## 2019-03-04 DIAGNOSIS — K219 Gastro-esophageal reflux disease without esophagitis: Secondary | ICD-10-CM | POA: Diagnosis not present

## 2019-03-13 ENCOUNTER — Other Ambulatory Visit: Payer: Self-pay | Admitting: Family Medicine

## 2019-03-25 ENCOUNTER — Other Ambulatory Visit: Payer: Self-pay

## 2019-03-25 ENCOUNTER — Ambulatory Visit (INDEPENDENT_AMBULATORY_CARE_PROVIDER_SITE_OTHER): Payer: Medicare HMO | Admitting: Pharmacist

## 2019-03-25 DIAGNOSIS — Z7901 Long term (current) use of anticoagulants: Secondary | ICD-10-CM

## 2019-03-25 DIAGNOSIS — I823 Embolism and thrombosis of renal vein: Secondary | ICD-10-CM | POA: Diagnosis not present

## 2019-03-25 LAB — POCT INR: INR: 3.3 — AB (ref 2.0–3.0)

## 2019-04-03 ENCOUNTER — Other Ambulatory Visit: Payer: Self-pay | Admitting: Family Medicine

## 2019-04-03 DIAGNOSIS — E1165 Type 2 diabetes mellitus with hyperglycemia: Secondary | ICD-10-CM | POA: Diagnosis not present

## 2019-04-03 DIAGNOSIS — E271 Primary adrenocortical insufficiency: Secondary | ICD-10-CM | POA: Diagnosis not present

## 2019-04-03 DIAGNOSIS — E063 Autoimmune thyroiditis: Secondary | ICD-10-CM | POA: Diagnosis not present

## 2019-04-03 DIAGNOSIS — E039 Hypothyroidism, unspecified: Secondary | ICD-10-CM | POA: Diagnosis not present

## 2019-04-07 DIAGNOSIS — Z8249 Family history of ischemic heart disease and other diseases of the circulatory system: Secondary | ICD-10-CM | POA: Diagnosis not present

## 2019-04-07 DIAGNOSIS — E271 Primary adrenocortical insufficiency: Secondary | ICD-10-CM | POA: Diagnosis not present

## 2019-04-07 DIAGNOSIS — E1165 Type 2 diabetes mellitus with hyperglycemia: Secondary | ICD-10-CM | POA: Diagnosis not present

## 2019-04-07 DIAGNOSIS — E039 Hypothyroidism, unspecified: Secondary | ICD-10-CM | POA: Diagnosis not present

## 2019-04-07 DIAGNOSIS — Z7901 Long term (current) use of anticoagulants: Secondary | ICD-10-CM | POA: Diagnosis not present

## 2019-04-07 DIAGNOSIS — E785 Hyperlipidemia, unspecified: Secondary | ICD-10-CM | POA: Diagnosis not present

## 2019-04-08 LAB — COMPREHENSIVE METABOLIC PANEL
ALT: 35 IU/L (ref 0–44)
AST: 34 IU/L (ref 0–40)
Albumin/Globulin Ratio: 1.6 (ref 1.2–2.2)
Albumin: 4.3 g/dL (ref 3.8–4.8)
Alkaline Phosphatase: 129 IU/L — ABNORMAL HIGH (ref 39–117)
BUN/Creatinine Ratio: 20 (ref 10–24)
BUN: 27 mg/dL (ref 8–27)
Bilirubin Total: 0.4 mg/dL (ref 0.0–1.2)
CO2: 22 mmol/L (ref 20–29)
Calcium: 9.5 mg/dL (ref 8.6–10.2)
Chloride: 97 mmol/L (ref 96–106)
Creatinine, Ser: 1.37 mg/dL — ABNORMAL HIGH (ref 0.76–1.27)
GFR calc Af Amer: 63 mL/min/{1.73_m2} (ref >59)
GFR calc non Af Amer: 55 mL/min/{1.73_m2} — ABNORMAL LOW (ref >59)
Globulin, Total: 2.7 g/dL (ref 1.5–4.5)
Glucose: 102 mg/dL — ABNORMAL HIGH (ref 65–99)
Potassium: 4.7 mmol/L (ref 3.5–5.2)
Sodium: 140 mmol/L (ref 134–144)
Total Protein: 7 g/dL (ref 6.0–8.5)

## 2019-04-08 LAB — LIPID PANEL
Chol/HDL Ratio: 4.4 ratio (ref 0.0–5.0)
Cholesterol, Total: 221 mg/dL — ABNORMAL HIGH (ref 100–199)
HDL: 50 mg/dL (ref >39)
LDL Chol Calc (NIH): 130 mg/dL — ABNORMAL HIGH (ref 0–99)
Triglycerides: 232 mg/dL — ABNORMAL HIGH (ref 0–149)
VLDL Cholesterol Cal: 41 mg/dL — ABNORMAL HIGH (ref 5–40)

## 2019-04-10 DIAGNOSIS — H11153 Pinguecula, bilateral: Secondary | ICD-10-CM | POA: Diagnosis not present

## 2019-04-10 DIAGNOSIS — H04123 Dry eye syndrome of bilateral lacrimal glands: Secondary | ICD-10-CM | POA: Diagnosis not present

## 2019-04-10 DIAGNOSIS — E119 Type 2 diabetes mellitus without complications: Secondary | ICD-10-CM | POA: Diagnosis not present

## 2019-04-10 DIAGNOSIS — H25013 Cortical age-related cataract, bilateral: Secondary | ICD-10-CM | POA: Diagnosis not present

## 2019-04-10 DIAGNOSIS — H40013 Open angle with borderline findings, low risk, bilateral: Secondary | ICD-10-CM | POA: Diagnosis not present

## 2019-04-10 DIAGNOSIS — H40033 Anatomical narrow angle, bilateral: Secondary | ICD-10-CM | POA: Diagnosis not present

## 2019-04-10 DIAGNOSIS — H18413 Arcus senilis, bilateral: Secondary | ICD-10-CM | POA: Diagnosis not present

## 2019-04-10 DIAGNOSIS — H0102A Squamous blepharitis right eye, upper and lower eyelids: Secondary | ICD-10-CM | POA: Diagnosis not present

## 2019-04-10 DIAGNOSIS — H0102B Squamous blepharitis left eye, upper and lower eyelids: Secondary | ICD-10-CM | POA: Diagnosis not present

## 2019-04-10 DIAGNOSIS — Z79899 Other long term (current) drug therapy: Secondary | ICD-10-CM | POA: Diagnosis not present

## 2019-04-14 ENCOUNTER — Inpatient Hospital Stay: Payer: Medicare HMO | Admitting: Family Medicine

## 2019-04-14 DIAGNOSIS — K8301 Primary sclerosing cholangitis: Secondary | ICD-10-CM | POA: Diagnosis not present

## 2019-04-22 ENCOUNTER — Ambulatory Visit (INDEPENDENT_AMBULATORY_CARE_PROVIDER_SITE_OTHER): Payer: Medicare HMO | Admitting: Pharmacist Clinician (PhC)/ Clinical Pharmacy Specialist

## 2019-04-22 ENCOUNTER — Other Ambulatory Visit: Payer: Self-pay

## 2019-04-22 DIAGNOSIS — I823 Embolism and thrombosis of renal vein: Secondary | ICD-10-CM | POA: Diagnosis not present

## 2019-04-22 DIAGNOSIS — Z7901 Long term (current) use of anticoagulants: Secondary | ICD-10-CM | POA: Diagnosis not present

## 2019-04-22 LAB — POCT INR: INR: 1.9 — AB (ref 2.0–3.0)

## 2019-05-09 DIAGNOSIS — H04123 Dry eye syndrome of bilateral lacrimal glands: Secondary | ICD-10-CM | POA: Diagnosis not present

## 2019-05-09 DIAGNOSIS — H18413 Arcus senilis, bilateral: Secondary | ICD-10-CM | POA: Diagnosis not present

## 2019-05-09 DIAGNOSIS — H0102A Squamous blepharitis right eye, upper and lower eyelids: Secondary | ICD-10-CM | POA: Diagnosis not present

## 2019-05-09 DIAGNOSIS — E119 Type 2 diabetes mellitus without complications: Secondary | ICD-10-CM | POA: Diagnosis not present

## 2019-05-09 DIAGNOSIS — H40013 Open angle with borderline findings, low risk, bilateral: Secondary | ICD-10-CM | POA: Diagnosis not present

## 2019-05-09 DIAGNOSIS — H11153 Pinguecula, bilateral: Secondary | ICD-10-CM | POA: Diagnosis not present

## 2019-05-09 DIAGNOSIS — H40033 Anatomical narrow angle, bilateral: Secondary | ICD-10-CM | POA: Diagnosis not present

## 2019-05-09 DIAGNOSIS — Z79899 Other long term (current) drug therapy: Secondary | ICD-10-CM | POA: Diagnosis not present

## 2019-05-09 DIAGNOSIS — H0102B Squamous blepharitis left eye, upper and lower eyelids: Secondary | ICD-10-CM | POA: Diagnosis not present

## 2019-05-09 DIAGNOSIS — H25013 Cortical age-related cataract, bilateral: Secondary | ICD-10-CM | POA: Diagnosis not present

## 2019-05-19 ENCOUNTER — Ambulatory Visit (INDEPENDENT_AMBULATORY_CARE_PROVIDER_SITE_OTHER): Payer: Medicare HMO | Admitting: Pharmacist Clinician (PhC)/ Clinical Pharmacy Specialist

## 2019-05-19 ENCOUNTER — Other Ambulatory Visit: Payer: Self-pay

## 2019-05-19 DIAGNOSIS — I823 Embolism and thrombosis of renal vein: Secondary | ICD-10-CM | POA: Diagnosis not present

## 2019-05-19 DIAGNOSIS — Z7901 Long term (current) use of anticoagulants: Secondary | ICD-10-CM

## 2019-05-19 LAB — POCT INR: INR: 1.9 — AB (ref 2.0–3.0)

## 2019-06-08 ENCOUNTER — Ambulatory Visit: Payer: Medicare HMO | Attending: Internal Medicine

## 2019-06-08 DIAGNOSIS — Z23 Encounter for immunization: Secondary | ICD-10-CM | POA: Insufficient documentation

## 2019-06-08 NOTE — Progress Notes (Signed)
   Covid-19 Vaccination Clinic  Name:  Kenneth Cline    MRN: [id number] DOB: 26-Nov-1955  06/08/2019  Mr. Kenneth Cline was observed post Covid-19 immunization for 15 minutes without incidence. He was provided with Vaccine Information Sheet and instruction to access the V-Safe system.   Mr. Kenneth Cline was instructed to call 911 with any severe reactions post vaccine: Marland Kitchen Difficulty breathing  . Swelling of your face and throat  . A fast heartbeat  . A bad rash all over your body  . Dizziness and weakness    Immunizations Administered    Name Date Dose VIS Date Route   Pfizer COVID-19 Vaccine 06/08/2019  9:14 AM 0.3 mL 04/04/2019 Intramuscular   Manufacturer: [place]   Lot: [id number]   [place]: [id number]

## 2019-06-16 ENCOUNTER — Other Ambulatory Visit: Payer: Self-pay

## 2019-06-16 ENCOUNTER — Ambulatory Visit (INDEPENDENT_AMBULATORY_CARE_PROVIDER_SITE_OTHER): Payer: Medicare HMO | Admitting: Pharmacist Clinician (PhC)/ Clinical Pharmacy Specialist

## 2019-06-16 DIAGNOSIS — I823 Embolism and thrombosis of renal vein: Secondary | ICD-10-CM | POA: Diagnosis not present

## 2019-06-16 DIAGNOSIS — Z7901 Long term (current) use of anticoagulants: Secondary | ICD-10-CM

## 2019-06-16 LAB — POCT INR: INR: 2.3 (ref 2.0–3.0)

## 2019-06-17 DIAGNOSIS — M4726 Other spondylosis with radiculopathy, lumbar region: Secondary | ICD-10-CM | POA: Diagnosis not present

## 2019-06-17 DIAGNOSIS — M5416 Radiculopathy, lumbar region: Secondary | ICD-10-CM | POA: Diagnosis not present

## 2019-06-22 ENCOUNTER — Other Ambulatory Visit: Payer: Self-pay | Admitting: Family Medicine

## 2019-07-02 ENCOUNTER — Ambulatory Visit: Payer: Medicare HMO | Attending: Internal Medicine

## 2019-07-02 ENCOUNTER — Ambulatory Visit: Payer: Self-pay

## 2019-07-02 DIAGNOSIS — Z23 Encounter for immunization: Secondary | ICD-10-CM

## 2019-07-02 DIAGNOSIS — G5762 Lesion of plantar nerve, left lower limb: Secondary | ICD-10-CM | POA: Diagnosis not present

## 2019-07-02 NOTE — Progress Notes (Signed)
   Covid-19 Vaccination Clinic  Name:  Kenneth Cline    MRN: [id number] DOB: [birth year]  07/02/2019  Mr. [name] was observed post Covid-19 immunization for 15 minutes without incident. He was provided with Vaccine Information Sheet and instruction to access the V-Safe system.   Mr. [name] was instructed to call 911 with any severe reactions post vaccine: Marland Kitchen Difficulty breathing  . Swelling of face and throat  . A fast heartbeat  . A bad rash all over body  . Dizziness and weakness   Immunizations Administered    Name Date Dose VIS Date Route   Pfizer COVID-19 Vaccine 07/02/2019  3:36 PM 0.3 mL 04/04/2019 Intramuscular   Manufacturer: [place]   Lot: [id number]   [place]: [id number]

## 2019-07-14 ENCOUNTER — Other Ambulatory Visit: Payer: Self-pay

## 2019-07-14 ENCOUNTER — Ambulatory Visit (INDEPENDENT_AMBULATORY_CARE_PROVIDER_SITE_OTHER): Payer: Medicare HMO | Admitting: Pharmacist

## 2019-07-14 DIAGNOSIS — Z7901 Long term (current) use of anticoagulants: Secondary | ICD-10-CM | POA: Diagnosis not present

## 2019-07-14 DIAGNOSIS — I823 Embolism and thrombosis of renal vein: Secondary | ICD-10-CM

## 2019-07-14 LAB — POCT INR: INR: 2.2 (ref 2.0–3.0)

## 2019-07-16 DIAGNOSIS — G5762 Lesion of plantar nerve, left lower limb: Secondary | ICD-10-CM | POA: Diagnosis not present

## 2019-07-21 DIAGNOSIS — Z86010 Personal history of colonic polyps: Secondary | ICD-10-CM | POA: Diagnosis not present

## 2019-07-21 DIAGNOSIS — Z8719 Personal history of other diseases of the digestive system: Secondary | ICD-10-CM | POA: Diagnosis not present

## 2019-07-21 DIAGNOSIS — K8301 Primary sclerosing cholangitis: Secondary | ICD-10-CM | POA: Diagnosis not present

## 2019-08-07 DIAGNOSIS — R69 Illness, unspecified: Secondary | ICD-10-CM | POA: Diagnosis not present

## 2019-08-11 ENCOUNTER — Other Ambulatory Visit: Payer: Self-pay | Admitting: Family Medicine

## 2019-08-21 DIAGNOSIS — D6859 Other primary thrombophilia: Secondary | ICD-10-CM | POA: Diagnosis not present

## 2019-08-21 DIAGNOSIS — K589 Irritable bowel syndrome without diarrhea: Secondary | ICD-10-CM | POA: Diagnosis not present

## 2019-08-21 DIAGNOSIS — E271 Primary adrenocortical insufficiency: Secondary | ICD-10-CM | POA: Diagnosis not present

## 2019-08-21 DIAGNOSIS — E1142 Type 2 diabetes mellitus with diabetic polyneuropathy: Secondary | ICD-10-CM | POA: Diagnosis not present

## 2019-08-21 DIAGNOSIS — R69 Illness, unspecified: Secondary | ICD-10-CM | POA: Diagnosis not present

## 2019-08-21 DIAGNOSIS — K219 Gastro-esophageal reflux disease without esophagitis: Secondary | ICD-10-CM | POA: Diagnosis not present

## 2019-08-21 DIAGNOSIS — E039 Hypothyroidism, unspecified: Secondary | ICD-10-CM | POA: Diagnosis not present

## 2019-08-21 DIAGNOSIS — E663 Overweight: Secondary | ICD-10-CM | POA: Diagnosis not present

## 2019-08-21 DIAGNOSIS — E785 Hyperlipidemia, unspecified: Secondary | ICD-10-CM | POA: Diagnosis not present

## 2019-08-21 DIAGNOSIS — J309 Allergic rhinitis, unspecified: Secondary | ICD-10-CM | POA: Diagnosis not present

## 2019-08-22 ENCOUNTER — Other Ambulatory Visit: Payer: Self-pay

## 2019-08-22 ENCOUNTER — Ambulatory Visit (INDEPENDENT_AMBULATORY_CARE_PROVIDER_SITE_OTHER): Payer: Medicare HMO | Admitting: Pharmacist

## 2019-08-22 DIAGNOSIS — Z7901 Long term (current) use of anticoagulants: Secondary | ICD-10-CM

## 2019-08-22 DIAGNOSIS — I823 Embolism and thrombosis of renal vein: Secondary | ICD-10-CM

## 2019-08-22 LAB — POCT INR: INR: 2.2 (ref 2.0–3.0)

## 2019-09-01 ENCOUNTER — Encounter: Payer: Self-pay | Admitting: Family Medicine

## 2019-09-07 DIAGNOSIS — R69 Illness, unspecified: Secondary | ICD-10-CM | POA: Diagnosis not present

## 2019-10-03 ENCOUNTER — Ambulatory Visit (INDEPENDENT_AMBULATORY_CARE_PROVIDER_SITE_OTHER): Payer: Medicare HMO | Admitting: Pharmacist

## 2019-10-03 ENCOUNTER — Other Ambulatory Visit: Payer: Self-pay

## 2019-10-03 DIAGNOSIS — Z7901 Long term (current) use of anticoagulants: Secondary | ICD-10-CM

## 2019-10-03 DIAGNOSIS — I823 Embolism and thrombosis of renal vein: Secondary | ICD-10-CM | POA: Diagnosis not present

## 2019-10-03 LAB — POCT INR: INR: 2.2 (ref 2.0–3.0)

## 2019-10-06 DIAGNOSIS — K8301 Primary sclerosing cholangitis: Secondary | ICD-10-CM | POA: Diagnosis not present

## 2019-10-06 DIAGNOSIS — K831 Obstruction of bile duct: Secondary | ICD-10-CM | POA: Diagnosis not present

## 2019-10-08 DIAGNOSIS — E039 Hypothyroidism, unspecified: Secondary | ICD-10-CM | POA: Diagnosis not present

## 2019-10-08 DIAGNOSIS — E1165 Type 2 diabetes mellitus with hyperglycemia: Secondary | ICD-10-CM | POA: Diagnosis not present

## 2019-10-08 DIAGNOSIS — E271 Primary adrenocortical insufficiency: Secondary | ICD-10-CM | POA: Diagnosis not present

## 2019-11-12 DIAGNOSIS — R69 Illness, unspecified: Secondary | ICD-10-CM | POA: Diagnosis not present

## 2019-11-17 ENCOUNTER — Ambulatory Visit (INDEPENDENT_AMBULATORY_CARE_PROVIDER_SITE_OTHER): Payer: Medicare HMO | Admitting: Pharmacist Clinician (PhC)/ Clinical Pharmacy Specialist

## 2019-11-17 ENCOUNTER — Other Ambulatory Visit: Payer: Self-pay

## 2019-11-17 DIAGNOSIS — I823 Embolism and thrombosis of renal vein: Secondary | ICD-10-CM | POA: Diagnosis not present

## 2019-11-17 DIAGNOSIS — Z7901 Long term (current) use of anticoagulants: Secondary | ICD-10-CM

## 2019-11-17 LAB — POCT INR: INR: 2.2 (ref 2.0–3.0)

## 2019-11-21 DIAGNOSIS — K8301 Primary sclerosing cholangitis: Secondary | ICD-10-CM | POA: Diagnosis not present

## 2019-11-21 DIAGNOSIS — M329 Systemic lupus erythematosus, unspecified: Secondary | ICD-10-CM | POA: Diagnosis not present

## 2019-11-21 DIAGNOSIS — K838 Other specified diseases of biliary tract: Secondary | ICD-10-CM | POA: Diagnosis not present

## 2019-11-21 DIAGNOSIS — M8580 Other specified disorders of bone density and structure, unspecified site: Secondary | ICD-10-CM | POA: Diagnosis not present

## 2019-11-21 DIAGNOSIS — E119 Type 2 diabetes mellitus without complications: Secondary | ICD-10-CM | POA: Diagnosis not present

## 2019-11-21 DIAGNOSIS — Z9189 Other specified personal risk factors, not elsewhere classified: Secondary | ICD-10-CM | POA: Diagnosis not present

## 2019-11-21 DIAGNOSIS — R978 Other abnormal tumor markers: Secondary | ICD-10-CM | POA: Diagnosis not present

## 2019-11-21 DIAGNOSIS — Z86010 Personal history of colonic polyps: Secondary | ICD-10-CM | POA: Diagnosis not present

## 2019-11-21 DIAGNOSIS — Z1289 Encounter for screening for malignant neoplasm of other sites: Secondary | ICD-10-CM | POA: Diagnosis not present

## 2019-11-21 DIAGNOSIS — D704 Cyclic neutropenia: Secondary | ICD-10-CM | POA: Diagnosis not present

## 2020-01-07 ENCOUNTER — Other Ambulatory Visit: Payer: Self-pay

## 2020-01-07 ENCOUNTER — Ambulatory Visit (INDEPENDENT_AMBULATORY_CARE_PROVIDER_SITE_OTHER): Payer: Medicare HMO | Admitting: Pharmacist

## 2020-01-07 DIAGNOSIS — Z7901 Long term (current) use of anticoagulants: Secondary | ICD-10-CM | POA: Diagnosis not present

## 2020-01-07 DIAGNOSIS — I823 Embolism and thrombosis of renal vein: Secondary | ICD-10-CM | POA: Diagnosis not present

## 2020-01-07 LAB — POCT INR: INR: 2.9 (ref 2.0–3.0)

## 2020-01-11 DIAGNOSIS — R69 Illness, unspecified: Secondary | ICD-10-CM | POA: Diagnosis not present

## 2020-01-29 ENCOUNTER — Other Ambulatory Visit: Payer: Self-pay

## 2020-01-29 ENCOUNTER — Ambulatory Visit (INDEPENDENT_AMBULATORY_CARE_PROVIDER_SITE_OTHER): Payer: Medicare HMO | Admitting: Family Medicine

## 2020-01-29 VITALS — BP 120/70 | HR 87 | Temp 97.8°F | Ht 68.0 in | Wt 183.0 lb

## 2020-01-29 DIAGNOSIS — L03011 Cellulitis of right finger: Secondary | ICD-10-CM | POA: Diagnosis not present

## 2020-01-29 DIAGNOSIS — Z23 Encounter for immunization: Secondary | ICD-10-CM

## 2020-01-29 DIAGNOSIS — R69 Illness, unspecified: Secondary | ICD-10-CM | POA: Diagnosis not present

## 2020-01-29 DIAGNOSIS — S61224A Laceration with foreign body of right ring finger without damage to nail, initial encounter: Secondary | ICD-10-CM

## 2020-01-29 MED ORDER — DOXYCYCLINE HYCLATE 100 MG PO TABS: 100.0000 mg | ORAL_TABLET | Freq: Two times a day (BID) | ORAL | 0 refills | Status: DC

## 2020-01-29 NOTE — Addendum Note (Signed)
Addended by: Saundra Shelling on: 01/29/2020 11:47 AM   Modules accepted: Orders

## 2020-01-29 NOTE — Progress Notes (Signed)
Subjective:    Patient ID: Kenneth Cline, male    DOB: [birth year], 64 y.o.   MRN: [id number]  HPI    Earlier this week, the patient got a splinter over the ulnar side of his fourth digit adjacent to the DIP joint.  The skin there is now erythematous with a visible pustule located at the point where the splinter went into his finger.  He has a very compromised immune system.  Furthermore he is not certain the last time he had a tetanus shot.  He also wonders if there may be any foreign material that did not come out. Past Medical History:  Diagnosis Date  . Adrenal hemorrhage [facility]) April 2016   Following shoulder surgery  . Adrenal insufficiency, primary, hemorrhagic ([place]) 07/2014   due to bilateral adrenal hemorrhage  . Autoimmune hepatitis ([place])    [place]  . Cellulitis 09/2018   septic shock, compartment syndrome s/p fasciotomy RLE at [facility]  . CVA (cerebral infarction) January 2017   3 small areas of infarct in both the cerebrum as well as the cerebellum -- , located with diplopia  . 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 Dyslipidemia   . Erectile dysfunction   . Essential hypertension   . Family history of premature coronary artery disease    2 brothers, one sister and father all with CAD.  One sister with valve disease.  Marland Kitchen GERD (gastroesophageal reflux disease)   . H/O acute cholangitis May 4235   Complicated by septic shock, acute pancreatitis and acute choledocholithiasis;   . Lupus anticoagulant with hemorrhagic disorder (HCC)    Previously on Xarelto. Now on warfarin  . Overweight (BMI 25.0-29.9)   . PSC (primary sclerosing cholangitis)    managed by Dr. Koleen Nimrod at [facility]  . Recurrent cholangitis 7/'16, 10/'16, 11/'16 & 2/'17   Initial ERCP with biliary stent placement followed by cholecystectomy forr cholecystitis- seen at [facility] and [place] felt to be [place]  . Septic shock due to Escherichia coli [facility]) October 2016; January 2017   Escherichia  coli bacteremia secondary to recurrent cholangitis -- status post ERCP  . Stevens-Johnson disease ([place]) 04/08/2013    Reaction to Biaxin  . 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 Type 2 diabetes mellitus [facility]) February 2017   Past Surgical History:  Procedure Laterality Date  . CPET/MET  11/27/2011   normal PFT, good effort-no ischemia burden  . ERCP W/ METAL STENT PLACEMENT  10/'16; 11/'16   [facility] - stent removal  . ERCP W/ METAL STENT PLACEMENT  06/01/2015   [place]. Likely cholangitis  . ERCP W/ PLASTIC STENT PLACEMENT  Sep 09 2014   [facility] [place] - Dr. Alvera Novel  . ERCP W/ SPHINCTEROTOMY AND BALLOON DILATION  11/10/2014   [facility] -- Removal of biliary stent,, to PACU duodenitis  . LAPAROSCOPIC CHOLECYSTECTOMY W/ CHOLANGIOGRAPHY  November 13 2014   [facility] [place], Dr. Devota Pace  . NM MYOCAR PERF WALL MOTION  06/28/2006   EF 36% , LV systolic fx norm.  Marland Kitchen ROTATOR CUFF REPAIR    . TRANSTHORACIC ECHOCARDIOGRAM  04/2015   No pericardial effusion. No shunt. Full study: Basal septal LVH. Pseudo-normal filling (GR 2 DD). EF 57%.  . TRANSTHORACIC ECHOCARDIOGRAM  08/2015   [facility]): Normal LV size. Basal septal hypertrophy. EF 57%. Pseudo-normal filling. Mild focal aortic valve thickening. (Study was done to  evaluate for possible endocarditis)   Current Outpatient Medications on File Prior to Visit  Medication Sig Dispense Refill  . aspirin EC 81 MG tablet Take 81 mg by mouth as directed.    . Calcium Carb-Cholecalciferol (CALCIUM + VITAMIN D3 PO) Take by mouth as directed.    . enoxaparin (LOVENOX) 80 MG/0.8ML injection Inject 0.8 mLs (80 mg total) into the skin every 12 (twelve) hours. 20 Syringe 0  . ezetimibe (ZETIA) 10 MG tablet Take 1 tablet (10 mg total) by mouth daily. 90 tablet 3  . ferrous sulfate 325 (65 FE) MG tablet Take 325 mg by mouth daily with breakfast.    . fludrocortisone (FLORINEF) 0.1  MG tablet Take 0.1 mg by mouth daily.    Marland Kitchen gabapentin (NEURONTIN) 300 MG capsule TAKE 1 CAPSULE BY MOUTH 3 TIMES A DAY MAY INCREASE TO UP TO 6 CAPSULES A DAY 180 capsule 4  . HYDROcodone-acetaminophen (NORCO) 5-325 MG tablet Take 1 tablet by mouth every 6 (six) hours as needed for moderate pain. 30 tablet 0  . ipratropium (ATROVENT) 0.06 % nasal spray 1-2 SPRAYS IN NOSTRILS 3 (THREE) TIMES DAILY AS NEEDED FOR RHINITIS (RUNNY NOSE/POST NASAL DRIP). 45 mL 0  . JANUVIA 100 MG tablet TAKE 1 TABLET BY MOUTH EVERY DAY 30 tablet 0  . levothyroxine (SYNTHROID, LEVOTHROID) 75 MCG tablet Take 37.5 mcg by mouth daily.    . magnesium oxide (MAG-OX) 400 MG tablet Take 400 mg by mouth daily.    . metFORMIN (GLUCOPHAGE-XR) 500 MG 24 hr tablet Take 1 tablet by mouth 2 (two) times daily.    . mometasone (NASONEX) 50 MCG/ACT nasal spray Place 2 sprays into the nose daily.    Marland Kitchen oxyCODONE-acetaminophen (PERCOCET) 5-325 MG tablet Take 1-2 tablets by mouth every 6 (six) hours as needed for severe pain. 45 tablet 0  . pantoprazole (PROTONIX) 40 MG tablet TAKE 1 TABLET BY MOUTH TWICE A DAY 180 tablet 3  . predniSONE (DELTASONE) 5 MG tablet Take 5 mg by mouth as directed.    . Probiotic Product (PROBIOTIC DAILY PO) Take by mouth.    . sertraline (ZOLOFT) 50 MG tablet TAKE 1 TABLET BY MOUTH EVERY DAY 90 tablet 3  . traMADol (ULTRAM) 50 MG tablet Take 1 tablet (50 mg total) by mouth every 12 (twelve) hours as needed for moderate pain. 30 tablet 0  . ursodiol (ACTIGALL) 500 MG tablet Take 500 mg by mouth 2 (two) times daily.  5  . warfarin (COUMADIN) 5 MG tablet TAKE 1 TO 1.5 TABLETS BY MOUTH DAILY AS DIRECTED BY COUMADIN CLINIC 135 tablet 1   No current facility-administered medications on file prior to visit.   Allergies  Allergen Reactions  . Biaxin [Clarithromycin] Rash    SJS  . Keflex [Cephalexin] Hives, Itching and Rash  . Erythromycin     Was told not to take d/t medical problems  . Macrolides And Ketolides  Other (See Comments)    Unknown  . Penicillins     REACTION: rash   Social History   Socioeconomic History  . Marital status: Married    Spouse name: Not on file  . Number of children: 2  . Years of education: Not on file  . Highest education level: Not on file  Occupational History    Employer: [employer]  Tobacco Use  . Smoking status: Never Smoker  . Smokeless tobacco: Never Used  Substance and Sexual Activity  . Alcohol use: Yes    Alcohol/week: 3.0 standard  drinks    Types: 3 Standard drinks or equivalent per week    Comment: occassional  . Drug use: No  . Sexual activity: Not on file  Other Topics Concern  . Not on file  Social History Narrative   He is a married father of 2 with 3 stepchildren.  He has 3 grandchildren his own and 3 step grandchildren.  He works as a Dealer for [employer].  He lives with his wife, [name], of 8 years.  He does not, and never did smoke.  He takes occasional alcohol beverage but nothing significant.  He does not do routine exercise, but does walk a lot at work.   Social Determinants of Health   Financial Resource Strain:   . Difficulty of Paying Living Expenses: Not on file  Food Insecurity:   . Worried About Charity fundraiser in the Last Year: Not on file  . Ran Out of Food in the Last Year: Not on file  Transportation Needs:   . Lack of Transportation (Medical): Not on file  . Lack of Transportation (Non-Medical): Not on file  Physical Activity:   . Days of Exercise per Week: Not on file  . Minutes of Exercise per Session: Not on file  Stress:   . Feeling of Stress : Not on file  Social Connections:   . Frequency of Communication with Friends and Family: Not on file  . Frequency of Social Gatherings with Friends and Family: Not on file  . Attends Religious Services: Not on file  . Active Member of Clubs or Organizations: Not on file  . Attends Archivist Meetings: Not on file  . Marital Status: Not on file  Intimate Partner  Violence:   . Fear of Current or Ex-Partner: Not on file  . Emotionally Abused: Not on file  . Physically Abused: Not on file  . Sexually Abused: Not on file     Review of Systems  All other systems reviewed and are negative.      Objective:   Physical Exam Vitals reviewed.  Constitutional:      Appearance: Normal appearance.  Cardiovascular:     Rate and Rhythm: Normal rate and regular rhythm.  Musculoskeletal:       Hands:  Skin:    Findings: Erythema present.  Neurological:     Mental Status: He is alert.           Assessment & Plan:  Cellulitis of right finger  Patient appears to have cellulitis.  I will start him on doxycycline 100 mg twice daily to cover for possible MRSA.  Patient also received a booster on his tetanus shot.  I did anesthetize the area near the pustule and made a 4 mm horizontal incision in the pustule expressing some pus.  Also then took a pair of forceps and probed within the incision to see if there was any foreign material.  Despite extensive probing I cannot see nor palpate any foreign material.  I then cleaned the wound thoroughly with peroxide, wrapped the wound with petroleum gauze to allow drainage, and wrapped the finger and [place].  Recheck if worsening.  Wound care discussed.

## 2020-02-02 DIAGNOSIS — R252 Cramp and spasm: Secondary | ICD-10-CM | POA: Diagnosis not present

## 2020-02-02 DIAGNOSIS — Z5181 Encounter for therapeutic drug level monitoring: Secondary | ICD-10-CM | POA: Diagnosis not present

## 2020-02-02 DIAGNOSIS — Z7952 Long term (current) use of systemic steroids: Secondary | ICD-10-CM | POA: Diagnosis not present

## 2020-02-02 DIAGNOSIS — E1165 Type 2 diabetes mellitus with hyperglycemia: Secondary | ICD-10-CM | POA: Diagnosis not present

## 2020-02-02 DIAGNOSIS — E319 Polyglandular dysfunction, unspecified: Secondary | ICD-10-CM | POA: Diagnosis not present

## 2020-02-02 DIAGNOSIS — E038 Other specified hypothyroidism: Secondary | ICD-10-CM | POA: Diagnosis not present

## 2020-02-02 DIAGNOSIS — R61 Generalized hyperhidrosis: Secondary | ICD-10-CM | POA: Diagnosis not present

## 2020-02-02 DIAGNOSIS — E063 Autoimmune thyroiditis: Secondary | ICD-10-CM | POA: Diagnosis not present

## 2020-02-03 IMAGING — US US EXTREM LOW*R* COMPLETE
1 series · 7 of 7 positions shown · non-contrast
Comparison: None.

CLINICAL DATA: Groin strain and soft tissue mass in the right
proximal thigh. The patient had an injury approximately 3 weeks ago.

EXAM:
RIGHT LOWER EXTREMITY SOFT TISSUE ULTRASOUND COMPLETE
TECHNIQUE: Ultrasound examination was performed including evaluation of the
muscles, tendons, joint, and adjacent soft tissues.

[Series 1: us extrem low*right* complete · 7 acquisitions, 7 frames shown]
[im 1/7]
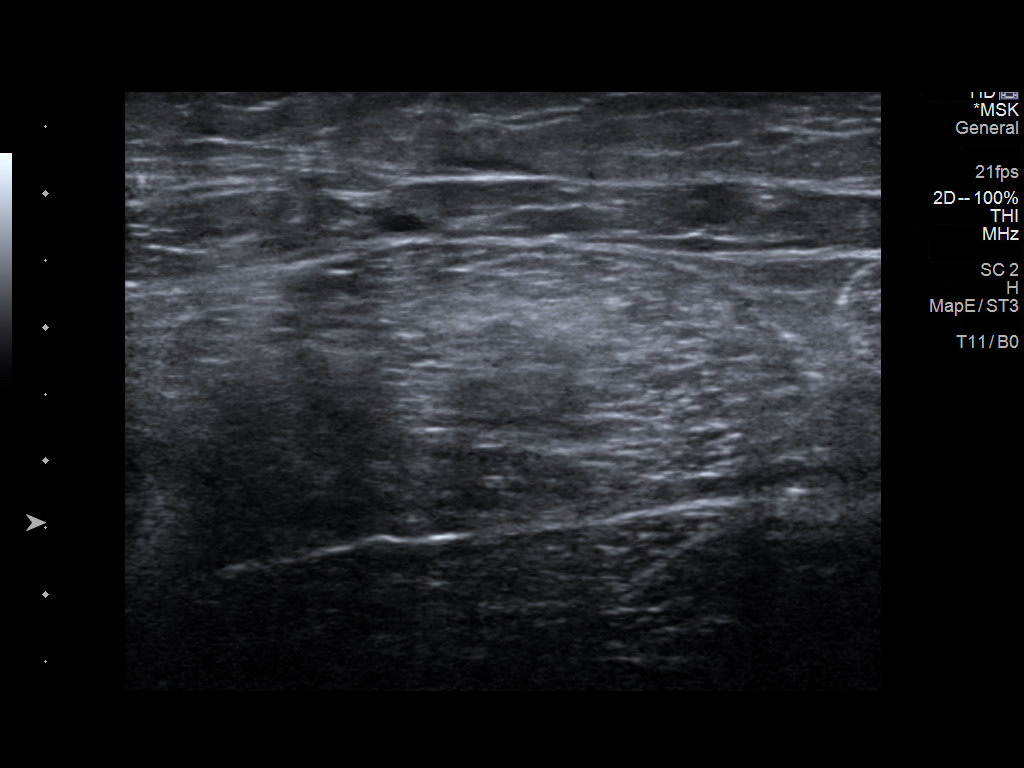
[im 2/7]
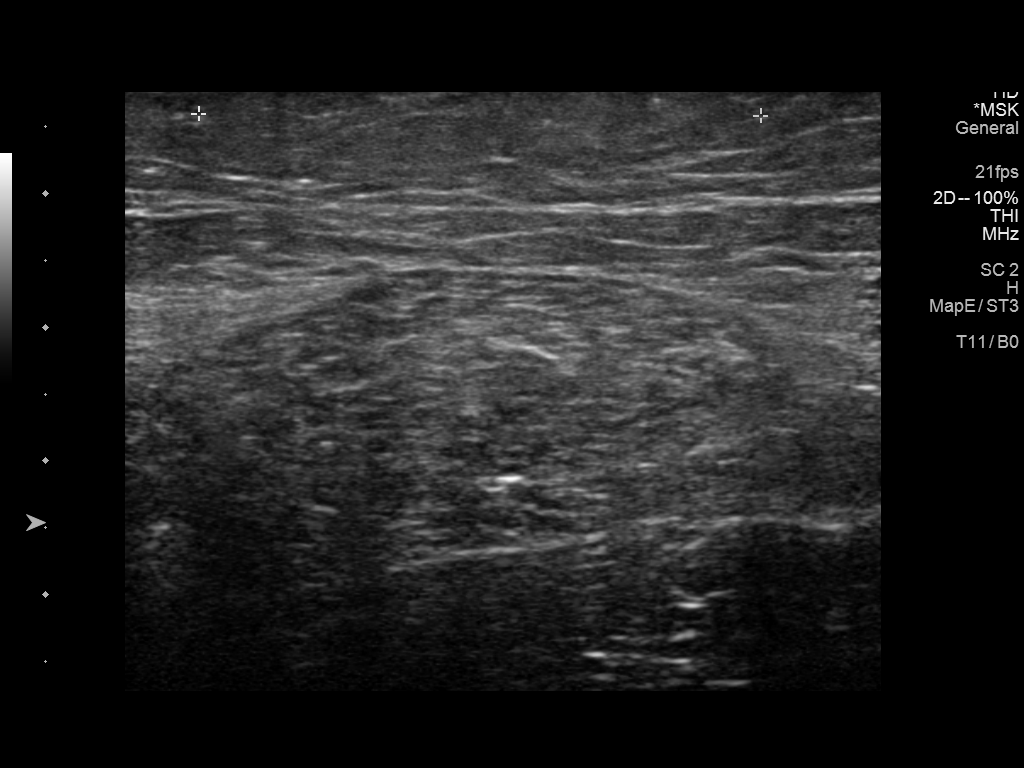
[im 3/7]
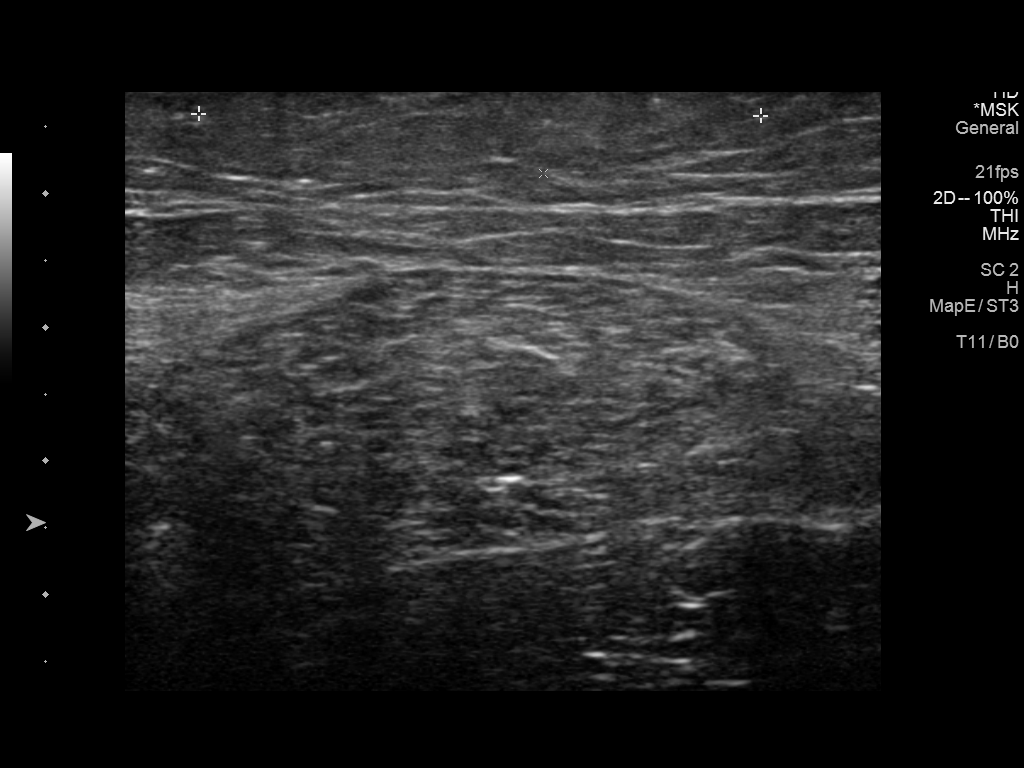
[im 4/7]
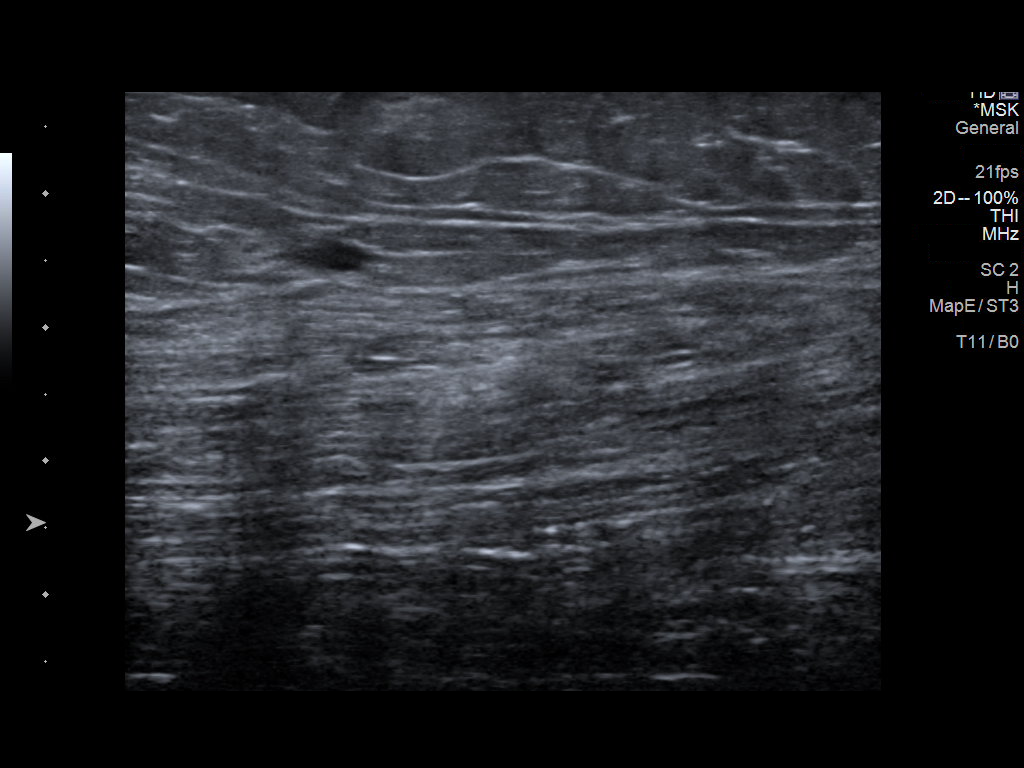
[im 5/7]
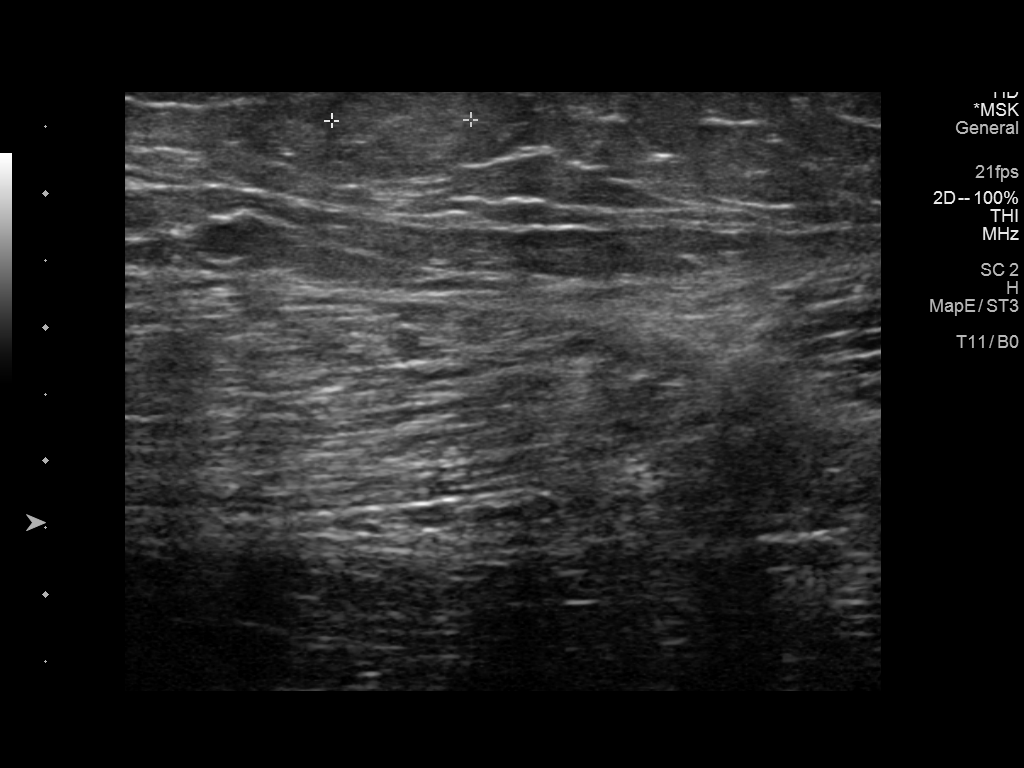
[im 6/7]
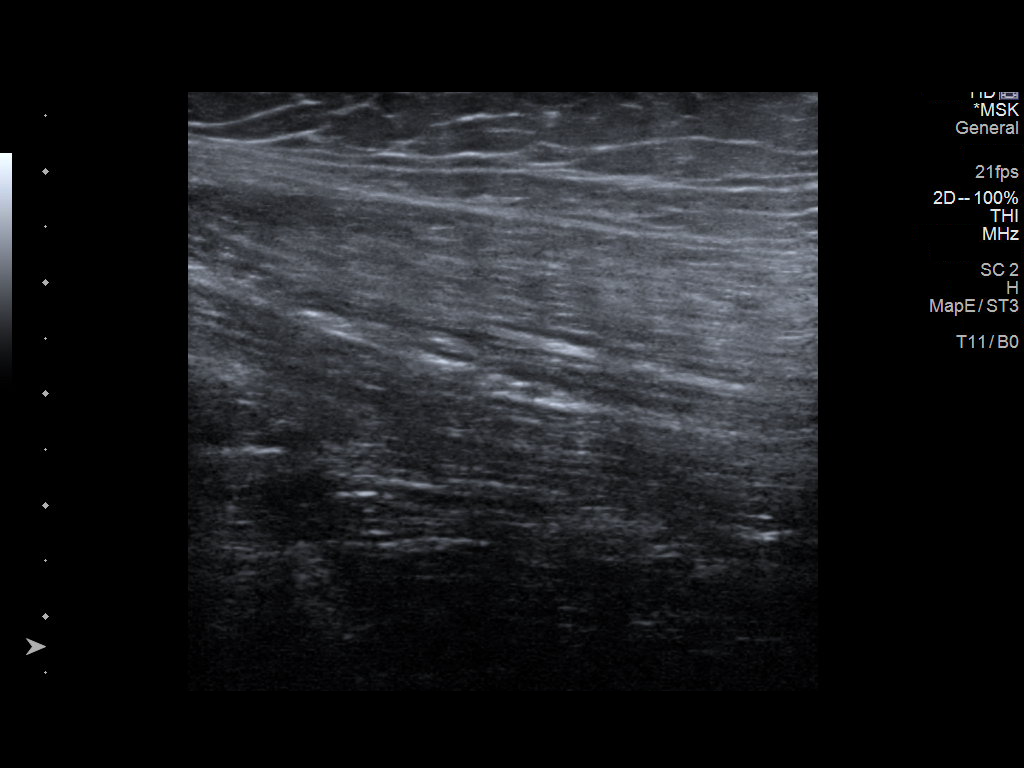
[im 7/7]
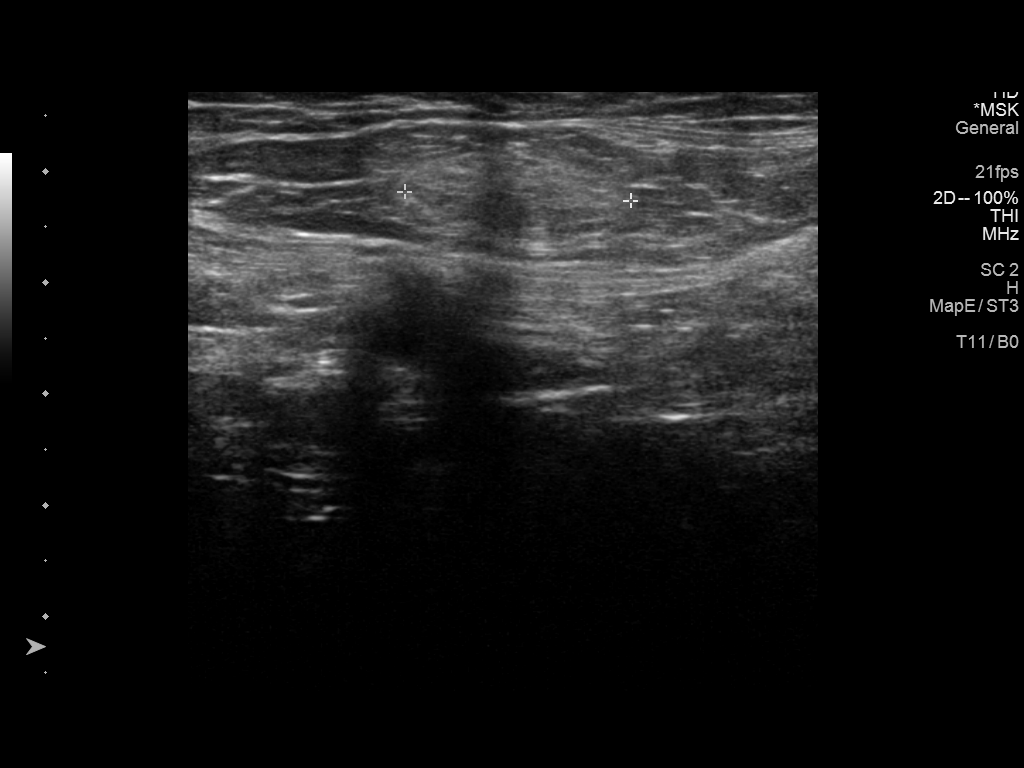

[7 of 7 positions shown; findings below may reference images not displayed]

FINDINGS: Muscles: There is no discrete muscle tear or hematoma. The right
adductor muscles were carefully examined with the 18 megahertz
transducer.

Other Soft Tissue Structures: The palpable soft tissue abnormality
is demonstrated be a 4.2 x 0.7 x 1.0 cm lobulated lipoma. There is a
2 cm maximum diameter subcutaneous lipoma just proximal to the
lobulated lipoma. This lipoma is not palpable.

No adenopathy or worrisome soft tissue masses.
IMPRESSION: 1. The palpable abnormality is a benign subcutaneous lipoma.
2. No evidence of muscle tear of the adductor muscles of the right
hip.
3. Additional subcutaneous lipoma just proximal to the palpable
lipoma.

## 2020-02-04 ENCOUNTER — Other Ambulatory Visit: Payer: Self-pay | Admitting: Cardiology

## 2020-02-04 NOTE — Telephone Encounter (Signed)
Rx(s) sent to pharmacy electronically.  

## 2020-02-08 DIAGNOSIS — R69 Illness, unspecified: Secondary | ICD-10-CM | POA: Diagnosis not present

## 2020-02-11 ENCOUNTER — Ambulatory Visit (INDEPENDENT_AMBULATORY_CARE_PROVIDER_SITE_OTHER): Payer: Medicare HMO | Admitting: Cardiology

## 2020-02-11 ENCOUNTER — Ambulatory Visit (INDEPENDENT_AMBULATORY_CARE_PROVIDER_SITE_OTHER): Payer: Medicare HMO | Admitting: Pharmacist

## 2020-02-11 ENCOUNTER — Encounter: Payer: Self-pay | Admitting: Cardiology

## 2020-02-11 VITALS — BP 132/86 | HR 90 | Ht 69.0 in | Wt 188.4 lb

## 2020-02-11 DIAGNOSIS — Z7901 Long term (current) use of anticoagulants: Secondary | ICD-10-CM | POA: Diagnosis not present

## 2020-02-11 DIAGNOSIS — I823 Embolism and thrombosis of renal vein: Secondary | ICD-10-CM

## 2020-02-11 DIAGNOSIS — I10 Essential (primary) hypertension: Secondary | ICD-10-CM | POA: Diagnosis not present

## 2020-02-11 DIAGNOSIS — Z8249 Family history of ischemic heart disease and other diseases of the circulatory system: Secondary | ICD-10-CM

## 2020-02-11 DIAGNOSIS — E785 Hyperlipidemia, unspecified: Secondary | ICD-10-CM | POA: Diagnosis not present

## 2020-02-11 LAB — POCT INR: INR: 3 (ref 2.0–3.0)

## 2020-02-11 NOTE — Assessment & Plan Note (Signed)
Probably related to cholangitis.  But he also does have the lupus anticoagulant.  Remains on warfarin.  Should be having INR checked today.

## 2020-02-11 NOTE — Patient Instructions (Signed)
Medication Instructions:  No changes  *If you need a refill on your cardiac medications before your next appointment, please call your pharmacy*   Lab Work: Lipid cmp If you have labs (blood work) drawn today and your tests are completely normal, you will receive your results only by: MyChart Message (if you have MyChart) OR A paper copy in the mail If you have any lab test that is abnormal or we need to change your treatment, we will call you to review the results.   Testing/Procedures: Not needed   Follow-Up: At [facility], you and your health needs are our priority.  As part of our continuing mission to provide you with exceptional heart care, we have created designated Provider Care Teams.  These Care Teams include your primary Cardiologist (physician) and Advanced Practice Providers (APPs -  Physician Assistants and Nurse Practitioners) who all work together to provide you with the care you need, when you need it.     Your next appointment:   12 month(s)  The format for your next appointment:   In Person  Provider:   David Harding, MD     

## 2020-02-11 NOTE — Assessment & Plan Note (Signed)
2 brothers, sister and his father always CAD.  Another sister with valve disease.  Thankfully he himself is stable.  He is not on any blood pressure medications because his blood pressure has been well controlled it is close to being controlled now.  Would consider ACE inhibitor or ARB in light of his diabetes if his pressures were to stay elevated.  A little bit high today (as a diabetic, in the borderline range). We have not been treating his lipids in light of his cholangitis issues for fear of statin effects on the liver.  At this point I think we need to follow-up his lipids and if not at goal may need to consider other options.  Nexletol or Zetia rate and potentially PCSK9 inhibitor.  Pending results, will anticipate having our pharmacist discuss options with him when he comes back in for his INR checks.

## 2020-02-11 NOTE — Progress Notes (Signed)
Primary Care Provider: Susy Frizzle, MD Cardiologist: Glenetta Hew, MD Electrophysiologist: None  Clinic Note: Chief Complaint  Patient presents with   Follow-up    Annual.  No major complaints.  Had COVID-19 in November 2020    HPI:    Kenneth Cline is a 64 y.o. 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  Problem List Items Addressed This Visit    Hypertension, essential, benign (Chronic)    Borderline blood pressures here today.  But relatively stable.  He is currently only on noncardiac medicines.  Currently on warfarin and Zetia from cardiac standpoint.      Relevant Orders   EKG 12-Lead   Dyslipidemia (Chronic)   Relevant Orders   Lipid panel   Comprehensive metabolic panel   Family history of premature coronary artery disease - Primary (Chronic)    2 brothers, sister and his father always CAD.  Another sister with valve disease.  Thankfully he himself is stable.  He is not on any blood pressure medications because his blood pressure has been well controlled it is close to being controlled now.  Would consider ACE inhibitor or ARB in light of his diabetes if his pressures were to stay elevated.  A little bit high today (as a diabetic, in the borderline range). We have not been treating his lipids in light of his cholangitis issues for fear of statin effects on the liver.  At this point I think we need to follow-up his lipids and if not at goal may need to consider other options.  Nexletol or Zetia rate and potentially PCSK9 inhibitor.  Pending results, will anticipate having our pharmacist discuss options with him when he comes back in for his INR checks.      Relevant Orders   EKG 12-Lead   Lipid panel   Comprehensive metabolic panel   Long term (current) use of anticoagulants (Chronic)      Remains on warfarin for combination of renal vein thrombosis and stroke also diagnosed with lupus anticoagulant.  INR being followed routinely.      Relevant Orders   Comprehensive metabolic panel   Renal vein thrombosis (HCC) (Chronic)    Probably related to cholangitis.  But he also does have the lupus anticoagulant.  Remains on warfarin.  Should be having INR checked today.         Kenneth Cline was last seen on February 11, 2019 doing quite well from a cardiac standpoint.  Not very active, but does do work around the house.  No notable cardiac symptoms.  He had had a prolonged hospitalization in June done at [facility] for initially pneumonia but that eventually the right leg abscess that was MSSA.  He had septic shock complicated by recent renal insufficiency.  The adrenal crisis is what caused him to decompensate.  He had short term intubation.  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  Recent Hospitalizations:   He had COVID-19 infection in November 2020.  He had 5 days in the hospital.  Reviewed  CV studies:    The following studies were reviewed today: (if available, images/films reviewed: From Epic Chart or Care Everywhere)  None:   Interval History:   Kenneth Cline returns here today for annual follow-up stating that really wants to go over the COVID-19 infection, he has been  doing fairly well.  He is essentially retired now and has not been all that active.  He does lots of activity around the house and does enjoy working out in his shop.  He is not doing any routine exercise however.  Most like last year, he notes very rare brief little episodes of tachycardia happening maybe once twice a year and only lasts about 2 to 3 minutes.  Otherwise really, he is not having any cardiac symptoms.  Doing well.  Is quite deconditioned probably, but no further palpitations or chest tightness pressure.  No obvious PND orthopnea.  No dyspnea on  exertion.  CV Review of Symptoms (Summary): no chest pain or dyspnea on exertion positive for - maybe rare fluttering in chest ~2-65mn, occurs ~1-2/yr; negative for - edema, orthopnea, paroxysmal nocturnal dyspnea, rapid heart rate or shortness of breath  The patient does not have symptoms concerning for COVID-19 infection (fever, chills, cough, or new shortness of breath).   REVIEWED OF SYSTEMS   Pertinent symptoms noted above.  Otherwise positive for: Joint pains, occasional cramping during the nighttime.  Occasional positional dizziness.  Short-term memory loss-mild.  Also notable bruising.  I have reviewed and (if needed) personally updated the patient's problem list, medications, allergies, past medical and surgical history, social and family history.   PAST MEDICAL HISTORY   Past Medical History:  Diagnosis Date   Adrenal hemorrhage ([facility] April 2016   Following shoulder surgery   [place] primary[place] hemorrh[place]   due to bilateral adrenal hemorrhage   Autoimmune hepatitis ([place]    PSC   Cellulitis 09/2018   [facility], [facility]  CVA (cerebral infarction) January 2017   3 small areas of infarct in both the cerebrum as well as the cerebellum -- , located with diplopia   [place]ic [place]ropenia ([place]    Status post bone marrow transplant   Dyslipidemia    Erectile dysfunction    Essential hypertension    Family history of premature coronary artery disease    2 brothers, one sister and father all with CAD.  One sister with valve disease.   GERD (gastroesophageal reflux disease)    H/O acute cholangitis May 22542  Complicated by septic shock, acute pancreatitis and acute choledocholithiasis;    Lupus anticoagulant with hemorrhagic disorder (HCC)    Previously on Xarelto. Now on warfarin   Overweight (BMI 25.0-29.9)    PSC (primary sclerosing cholangitis)    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 Likely cholangitis   ERCP W/ PLASTIC STENT PLACEMENT  Sep 09 2014   [facility] - Dr. Alvera Novel   ERCP W/ [place]  11/10/2014   [facility] -- Removal of biliary stent,, to PACU duodenitis   LAPAROSCOPIC CHOLECYSTECTOMY W/ CHOLANGIOGRAPHY  November 13 2014   [facility], Dr. Warnell Bureau [place]   NM MYOCAR PERF WALL MOTION  06/28/2006   EF 16% , LV systolic fx norm.   ROTATOR CUFF REPAIR     TRANSTHORACIC ECHOCARDIOGRAM  04/2015   No pericardial effusion. No shunt. Full study: Basal septal LVH. Pseudo-normal filling (GR 2 DD). EF 57%.   TRANSTHORACIC ECHOCARDIOGRAM  08/2015   [facility]): Normal LV size. Basal septal hypertrophy. EF 57%. Pseudo-normal filling. Mild focal aortic valve thickening. (Study was done to evaluate for possible endocarditis)    Immunization History  Administered Date(s) Administered   DTP / HiB 01/01/2013   Influenza Whole 01/01/2013   Influenza, Quadrivalent, Recombinant, Inj, Pf 12/28/2014   Influenza,inj,Quad PF,6+ Mos 01/10/2017   Influenza,inj,quad, With Preservative 12/28/2014    Influenza-Unspecified 01/10/2017, 12/31/2017   PFIZER SARS-COV-2 Vaccination 06/08/2019, 07/02/2019   PPD Test 09/02/2015   Pneumococcal Conjugate-13 03/17/2017   Td 01/29/2020   Zoster Recombinat (Shingrix) 03/17/2017, 07/16/2017   --> due for booster for Vaccine.   MEDICATIONS/ALLERGIES   Current Meds  Medication Sig   aspirin EC 81 MG tablet Take 81 mg by mouth as directed.   Calcium Carb-Cholecalciferol (CALCIUM + VITAMIN D3 PO) Take by mouth as directed.   enoxaparin (LOVENOX) 80 MG/0.8ML injection Inject 0.8 mLs (80 mg total) into the skin every 12 (twelve) hours.   ezetimibe (ZETIA) 10 MG tablet Take 1 tablet (10 mg total) by mouth daily.   ferrous sulfate 325 (65 FE) MG tablet Take 325 mg by mouth daily with breakfast.   fludrocortisone (FLORINEF) 0.1 MG tablet Take 0.1 mg by mouth daily.   gabapentin (NEURONTIN) 300 MG capsule TAKE 1 CAPSULE BY MOUTH 3 TIMES A DAY MAY INCREASE TO UP TO 6 CAPSULES A DAY   ipratropium (ATROVENT) 0.06 % nasal spray 1-2 SPRAYS IN NOSTRILS 3 (THREE) TIMES DAILY AS NEEDED FOR RHINITIS (RUNNY NOSE/POST NASAL DRIP).   JANUVIA 100 MG tablet TAKE 1 TABLET BY MOUTH EVERY DAY   levothyroxine (SYNTHROID, LEVOTHROID) 75 MCG tablet Take 37.5 mcg by mouth daily.   magnesium oxide (MAG-OX) 400 MG tablet Take 400 mg by mouth daily.   metFORMIN (GLUCOPHAGE-XR) 500 MG 24 hr tablet Take 1 tablet by mouth 2 (two) times daily.   mometasone (NASONEX) 50 MCG/ACT nasal spray Place 2 sprays into the nose daily.   pantoprazole (PROTONIX) 40 MG tablet TAKE 1 TABLET BY MOUTH TWICE A DAY   predniSONE (DELTASONE) 5 MG tablet Take 5 mg by mouth as directed.   Probiotic Product (PROBIOTIC DAILY PO) Take by mouth.   sertraline (ZOLOFT) 50 MG tablet TAKE 1 TABLET BY MOUTH EVERY DAY   ursodiol (ACTIGALL) 500 MG tablet Take 500 mg by mouth 2 (two) times daily.   warfarin (COUMADIN) 5 MG tablet TAKE 1 TO 1.5 TABLETS BY MOUTH DAILY AS DIRECTED BY COUMADIN  CLINIC    Allergies  Allergen Reactions   Biaxin [Clarithromycin] Rash    SJS   Keflex [Cephalexin] Hives, Itching and Rash   Macrolides And Ketolides Other (See Comments)    Unknown   Erythromycin     Was told not to take d/t medical problems   Penicillins     REACTION: rash    SOCIAL HISTORY/FAMILY HISTORY   Reviewed  in Epic:  Pertinent findings: no change  OBJCTIVE -PE, EKG, labs   Wt Readings from Last 3 Encounters:  02/11/20 188 lb 6.4 oz (85.5 kg)  01/29/20 183 lb (83 kg)  02/11/19 185 lb 6.4 oz (84.1 kg)    Physical Exam: BP 132/86    Pulse 90    Ht 5' 9"  (1.753 m)    Wt 188 lb 6.4 oz (85.5 kg)    BMI 27.82 kg/m  Physical Exam Vitals reviewed.  Constitutional:      General: He is not in acute distress.    Appearance: Normal appearance. He is normal weight. He is not ill-appearing (Chronically ill-appearing, but nontoxic), toxic-appearing or diaphoretic.     Comments: A little bit disheveled, but no obvious distress.  HENT:     Head: Normocephalic and atraumatic.     Mouth/Throat:     Mouth: Mucous membranes are moist.  Neck:     Vascular: No carotid bruit.  Cardiovascular:     Rate and Rhythm: Normal rate and regular rhythm.     Pulses: Normal pulses.  Pulmonary:     Effort: Pulmonary effort is normal.     Breath sounds: Normal breath sounds. No wheezing.  Musculoskeletal:        General: Deformity (Well-healed scarring in the right calf from his surgery.) present. No swelling. Normal range of motion.     Cervical back: Normal range of motion and neck supple.  Skin:    Comments: Diffuse small focal bruises on the bilateral arms.  He has couple areas that are bleeding from scraping them earlier today.  No clear rash, just significant bruising the arms.  Neurological:     General: No focal deficit present.     Mental Status: He is alert and oriented to person, place, and time.  Psychiatric:        Mood and Affect: Mood normal.        Behavior:  Behavior normal. Behavior is cooperative.        Thought Content: Thought content normal.        Judgment: Judgment normal.      Adult ECG Report [name]  Recent Labs:    Lab Results  Component Value Date   CHOL 221 (H) 04/07/2019   HDL 50 04/07/2019   LDLCALC 130 (H) 04/07/2019   TRIG 232 (H) 04/07/2019   CHOLHDL 4.4 04/07/2019   Lab Results  Component Value Date   CREATININE 1.37 (H) 04/07/2019   BUN 27 04/07/2019   NA 140 04/07/2019   K 4.7 04/07/2019   CL 97 04/07/2019   CO2 22 04/07/2019   Lab Results  Component Value Date   TSH 1.25 09/03/2014    ASSESSMENT/PLAN    Problem List Items Addressed This Visit    Hypertension, essential, benign (Chronic)    Borderline blood pressures here today.  But relatively stable.  He is currently only on noncardiac medicines.  Currently on warfarin and Zetia from cardiac standpoint.      Relevant Orders   EKG 12-Lead   Dyslipidemia (Chronic)   Relevant Orders   Lipid panel   Comprehensive metabolic panel   Family history of premature coronary artery disease - Primary (Chronic)    2 brothers, sister and his father always CAD.  Another sister with valve disease.  Thankfully he himself is stable.  He is not on any blood pressure medications because his blood pressure has been well controlled it is close to being controlled now.  Would  consider ACE inhibitor or ARB in light of his diabetes if his pressures were to stay elevated.  A little bit high today (as a diabetic, in the borderline range). We have not been treating his lipids in light of his cholangitis issues for fear of statin effects on the liver.  At this point I think we need to follow-up his lipids and if not at goal may need to consider other options.  Nexletol or Zetia rate and potentially PCSK9 inhibitor.  Pending results, will anticipate having our pharmacist discuss options with him when he comes back in for his INR checks.      Relevant Orders   EKG 12-Lead    Lipid panel   Comprehensive metabolic panel   Long term (current) use of anticoagulants (Chronic)    Remains on warfarin for combination of renal vein thrombosis and stroke also diagnosed with lupus anticoagulant.  INR being followed routinely.      Relevant Orders   Comprehensive metabolic panel   Renal vein thrombosis (HCC) (Chronic)    Probably related to cholangitis.  But he also does have the lupus anticoagulant.  Remains on warfarin.  Should be having INR checked today.         COVID-19 Education: The signs and symptoms of COVID-19 were discussed with the patient and how to seek care for testing (follow up with PCP or arrange E-visit).   The importance of social distancing and COVID-19 vaccination was discussed today.  The patient is practicing social distancing & Masking.   I spent a total of 24 minutes with the patient spent in direct patient consultation.  Additional time spent with chart review  / charting (studies, outside notes, etc): 8 Total Time: 32 min   Current medicines are reviewed at length with the patient today.  (+/- concerns) none  This visit occurred during the SARS-CoV-2 public health emergency.  Safety protocols were in place, including screening questions prior to the visit, additional usage of staff PPE, and extensive cleaning of exam room while observing appropriate contact time as indicated for disinfecting solutions.  Notice: This dictation was prepared with Dragon dictation along with smaller phrase technology. Any transcriptional errors that result from this process are unintentional and may not be corrected upon review.  Patient Instructions / Medication Changes & Studies & Tests Ordered   Patient Instructions  Medication Instructions:  No changes *If you need a refill on your cardiac medications before your next appointment, please call your pharmacy*   Lab Work: Lipid  cmp  If you have labs (blood work) drawn today and your tests are  completely normal, you will receive your results only by:  MyChart Message (if you have MyChart) OR  A paper copy in the mail If you have any lab test that is abnormal or we need to change your treatment, we will call you to review the results.   Testing/Procedures: Not needed   Follow-Up: At [facility], you and your health needs are our priority.  As part of our continuing mission to provide you with exceptional heart care, we have created designated Provider Care Teams.  These Care Teams include your primary Cardiologist (physician) and Advanced Practice Providers (APPs -  Physician Assistants and Nurse Practitioners) who all work together to provide you with the care you need, when you need it.    Your next appointment:   12 month(s)  The format for your next appointment:   In Person  Provider:   Glenetta Hew, MD  Studies Ordered:   Orders Placed This Encounter  Procedures   Lipid panel   Comprehensive metabolic panel   EKG 09-TVMT     Glenetta Hew, M.D., M.S. Interventional Cardiologist   Pager # [phone] Phone # [phone] [address] [place], [place] [address]   Thank you for choosing Heartcare at [facility]!!

## 2020-02-11 NOTE — Assessment & Plan Note (Signed)
Remains on warfarin for combination of renal vein thrombosis and stroke also diagnosed with lupus anticoagulant.  INR being followed routinely.

## 2020-02-11 NOTE — Assessment & Plan Note (Signed)
Borderline blood pressures here today.  But relatively stable.  He is currently only on noncardiac medicines.  Currently on warfarin and Zetia from cardiac standpoint.

## 2020-02-18 ENCOUNTER — Telehealth: Payer: Self-pay

## 2020-02-18 NOTE — Telephone Encounter (Signed)
Nov 5: Last dose of warfarin.  Nov 6: No warfarin or enoxaparin (Lovenox).  Nov 7: Inject enoxaparin 80 mg in the fatty abdominal tissue at least 2 inches from the belly button twice a day about 12 hours apart, 8am and 8pm rotate sites. No warfarin.  Nov 8: Inject enoxaparin in the fatty tissue every 12 hours, 8am and 8pm. No warfarin.  Nov 9: Inject enoxaparin in the fatty tissue every 12 hours, 8am and 8pm. No warfarin.  Nov 10: Inject enoxaparin in the fatty tissue in the morning at 8 am (No PM dose). No warfarin.  Nov 11: Procedure Day - No enoxaparin - Resume warfarin 7.5 mg in the evening or as directed by doctor   Nov 12: Resume enoxaparin inject in the fatty tissue every 12 hours and take warfarin 10 mg  Nov 13: Inject enoxaparin in the fatty tissue every 12 hours and take warfarin 7.5 mg  Nov 14: Inject enoxaparin in the fatty tissue every 12 hours and take warfarin 5 mg   Nov 15: Inject enoxaparin in the fatty tissue every 12 hours and take warfarin 7.5 mg  Nov 16: Inject enoxaparin in the fatty tissue every 12 hours and take warfarin 5 mg  Nov 17: warfarin appt to check INR.

## 2020-02-18 NOTE — Telephone Encounter (Signed)
Pt called and lmom [place] stated has an upcoming colonoscopy 11/10 needs bridge plz advise... will route to pharmd pool

## 2020-02-19 ENCOUNTER — Encounter: Payer: Self-pay | Admitting: Pharmacist Clinician (PhC)/ Clinical Pharmacy Specialist

## 2020-02-20 ENCOUNTER — Other Ambulatory Visit: Payer: Self-pay | Admitting: Pharmacist Clinician (PhC)/ Clinical Pharmacy Specialist

## 2020-02-20 MED ORDER — ENOXAPARIN SODIUM 80 MG/0.8ML ~~LOC~~ SOLN: 80.0000 mg | Freq: Two times a day (BID) | SUBCUTANEOUS | 0 refills | Status: DC

## 2020-02-23 ENCOUNTER — Other Ambulatory Visit: Payer: Self-pay

## 2020-02-23 ENCOUNTER — Ambulatory Visit (INDEPENDENT_AMBULATORY_CARE_PROVIDER_SITE_OTHER): Payer: Medicare HMO | Admitting: Family Medicine

## 2020-02-23 DIAGNOSIS — Z23 Encounter for immunization: Secondary | ICD-10-CM | POA: Diagnosis not present

## 2020-02-24 ENCOUNTER — Encounter: Payer: Self-pay | Admitting: Family Medicine

## 2020-02-24 NOTE — Progress Notes (Signed)
Patient here for COVID19 vaccination only.  I did not examine the patient.  I did review his medical history, medications, and allergies and vaccine consent form.  CMA gave vaccination. Patient tolerated well.  Virginia Crews, MD, MPH [facility] 02/24/2020 1:11 PM

## 2020-03-03 DIAGNOSIS — D122 Benign neoplasm of ascending colon: Secondary | ICD-10-CM | POA: Diagnosis not present

## 2020-03-03 DIAGNOSIS — N189 Chronic kidney disease, unspecified: Secondary | ICD-10-CM | POA: Diagnosis not present

## 2020-03-03 DIAGNOSIS — M549 Dorsalgia, unspecified: Secondary | ICD-10-CM | POA: Diagnosis not present

## 2020-03-03 DIAGNOSIS — K5730 Diverticulosis of large intestine without perforation or abscess without bleeding: Secondary | ICD-10-CM | POA: Diagnosis not present

## 2020-03-03 DIAGNOSIS — K648 Other hemorrhoids: Secondary | ICD-10-CM | POA: Diagnosis not present

## 2020-03-03 DIAGNOSIS — Z8616 Personal history of COVID-19: Secondary | ICD-10-CM | POA: Diagnosis not present

## 2020-03-03 DIAGNOSIS — K743 Primary biliary cirrhosis: Secondary | ICD-10-CM | POA: Diagnosis not present

## 2020-03-03 DIAGNOSIS — I129 Hypertensive chronic kidney disease with stage 1 through stage 4 chronic kidney disease, or unspecified chronic kidney disease: Secondary | ICD-10-CM | POA: Diagnosis not present

## 2020-03-03 DIAGNOSIS — Z86010 Personal history of colonic polyps: Secondary | ICD-10-CM | POA: Diagnosis not present

## 2020-03-03 DIAGNOSIS — E785 Hyperlipidemia, unspecified: Secondary | ICD-10-CM | POA: Diagnosis not present

## 2020-03-03 DIAGNOSIS — K635 Polyp of colon: Secondary | ICD-10-CM | POA: Diagnosis not present

## 2020-03-03 DIAGNOSIS — Z1211 Encounter for screening for malignant neoplasm of colon: Secondary | ICD-10-CM | POA: Diagnosis not present

## 2020-03-03 DIAGNOSIS — E1122 Type 2 diabetes mellitus with diabetic chronic kidney disease: Secondary | ICD-10-CM | POA: Diagnosis not present

## 2020-03-03 DIAGNOSIS — K8301 Primary sclerosing cholangitis: Secondary | ICD-10-CM | POA: Diagnosis not present

## 2020-03-03 DIAGNOSIS — D6861 Antiphospholipid syndrome: Secondary | ICD-10-CM | POA: Diagnosis not present

## 2020-03-03 DIAGNOSIS — K219 Gastro-esophageal reflux disease without esophagitis: Secondary | ICD-10-CM | POA: Diagnosis not present

## 2020-03-08 ENCOUNTER — Telehealth: Payer: Self-pay

## 2020-03-08 ENCOUNTER — Ambulatory Visit (INDEPENDENT_AMBULATORY_CARE_PROVIDER_SITE_OTHER): Payer: Medicare HMO

## 2020-03-08 DIAGNOSIS — E785 Hyperlipidemia, unspecified: Secondary | ICD-10-CM | POA: Diagnosis not present

## 2020-03-08 DIAGNOSIS — Z5181 Encounter for therapeutic drug level monitoring: Secondary | ICD-10-CM

## 2020-03-08 DIAGNOSIS — Z7901 Long term (current) use of anticoagulants: Secondary | ICD-10-CM

## 2020-03-08 DIAGNOSIS — Z8249 Family history of ischemic heart disease and other diseases of the circulatory system: Secondary | ICD-10-CM | POA: Diagnosis not present

## 2020-03-08 DIAGNOSIS — I823 Embolism and thrombosis of renal vein: Secondary | ICD-10-CM | POA: Diagnosis not present

## 2020-03-08 LAB — COMPREHENSIVE METABOLIC PANEL
ALT: 89 IU/L — ABNORMAL HIGH (ref 0–44)
AST: 65 IU/L — ABNORMAL HIGH (ref 0–40)
Albumin/Globulin Ratio: 1.8 (ref 1.2–2.2)
Albumin: 4 g/dL (ref 3.8–4.8)
Alkaline Phosphatase: 77 IU/L (ref 44–121)
BUN/Creatinine Ratio: 15 (ref 10–24)
BUN: 15 mg/dL (ref 8–27)
Bilirubin Total: 0.2 mg/dL (ref 0.0–1.2)
CO2: 24 mmol/L (ref 20–29)
Calcium: 9 mg/dL (ref 8.6–10.2)
Chloride: 105 mmol/L (ref 96–106)
Creatinine, Ser: 1.03 mg/dL (ref 0.76–1.27)
GFR calc Af Amer: 88 mL/min/{1.73_m2} (ref >59)
GFR calc non Af Amer: 76 mL/min/{1.73_m2} (ref >59)
Globulin, Total: 2.2 g/dL (ref 1.5–4.5)
Glucose: 101 mg/dL — ABNORMAL HIGH (ref 65–99)
Potassium: 4.4 mmol/L (ref 3.5–5.2)
Sodium: 143 mmol/L (ref 134–144)
Total Protein: 6.2 g/dL (ref 6.0–8.5)

## 2020-03-08 LAB — LIPID PANEL
Chol/HDL Ratio: 4.6 ratio (ref 0.0–5.0)
Cholesterol, Total: 213 mg/dL — ABNORMAL HIGH (ref 100–199)
HDL: 46 mg/dL (ref >39)
LDL Chol Calc (NIH): 132 mg/dL — ABNORMAL HIGH (ref 0–99)
Triglycerides: 198 mg/dL — ABNORMAL HIGH (ref 0–149)
VLDL Cholesterol Cal: 35 mg/dL (ref 5–40)

## 2020-03-08 LAB — POCT INR: INR: 1.5 — AB (ref 2.0–3.0)

## 2020-03-08 NOTE — Patient Instructions (Addendum)
Take 2 tablets today and then Continue with 5mg  daily expect for 7.5mg  on Mondays and Fridays.  Recheck INR in 4 weeks.  Continue Lovenox today and Tuesday only.

## 2020-03-08 NOTE — Telephone Encounter (Signed)
Spoke to patient and informed him to continue Lovenox today and Tuesday only.  He verbalized understanding.

## 2020-03-15 DIAGNOSIS — K831 Obstruction of bile duct: Secondary | ICD-10-CM | POA: Diagnosis not present

## 2020-03-15 DIAGNOSIS — K8301 Primary sclerosing cholangitis: Secondary | ICD-10-CM | POA: Diagnosis not present

## 2020-03-15 DIAGNOSIS — R69 Illness, unspecified: Secondary | ICD-10-CM | POA: Diagnosis not present

## 2020-03-15 DIAGNOSIS — E1165 Type 2 diabetes mellitus with hyperglycemia: Secondary | ICD-10-CM | POA: Diagnosis not present

## 2020-04-05 ENCOUNTER — Other Ambulatory Visit: Payer: Self-pay

## 2020-04-05 ENCOUNTER — Ambulatory Visit (INDEPENDENT_AMBULATORY_CARE_PROVIDER_SITE_OTHER): Payer: Medicare HMO

## 2020-04-05 DIAGNOSIS — Z5181 Encounter for therapeutic drug level monitoring: Secondary | ICD-10-CM

## 2020-04-05 DIAGNOSIS — I823 Embolism and thrombosis of renal vein: Secondary | ICD-10-CM

## 2020-04-05 DIAGNOSIS — Z7901 Long term (current) use of anticoagulants: Secondary | ICD-10-CM | POA: Diagnosis not present

## 2020-04-05 LAB — POCT INR: INR: 2 (ref 2.0–3.0)

## 2020-04-05 NOTE — Patient Instructions (Signed)
Continue with 5mg  daily expect for 7.5mg  on Mondays and Fridays.  Recheck INR in 6 weeks.

## 2020-04-21 DIAGNOSIS — E119 Type 2 diabetes mellitus without complications: Secondary | ICD-10-CM | POA: Diagnosis not present

## 2020-04-21 DIAGNOSIS — H40033 Anatomical narrow angle, bilateral: Secondary | ICD-10-CM | POA: Diagnosis not present

## 2020-04-21 DIAGNOSIS — H0102A Squamous blepharitis right eye, upper and lower eyelids: Secondary | ICD-10-CM | POA: Diagnosis not present

## 2020-04-21 DIAGNOSIS — H2513 Age-related nuclear cataract, bilateral: Secondary | ICD-10-CM | POA: Diagnosis not present

## 2020-04-21 DIAGNOSIS — H0102B Squamous blepharitis left eye, upper and lower eyelids: Secondary | ICD-10-CM | POA: Diagnosis not present

## 2020-04-21 DIAGNOSIS — H04123 Dry eye syndrome of bilateral lacrimal glands: Secondary | ICD-10-CM | POA: Diagnosis not present

## 2020-04-21 DIAGNOSIS — R69 Illness, unspecified: Secondary | ICD-10-CM | POA: Diagnosis not present

## 2020-04-21 DIAGNOSIS — H40013 Open angle with borderline findings, low risk, bilateral: Secondary | ICD-10-CM | POA: Diagnosis not present

## 2020-04-21 LAB — HM DIABETES EYE EXAM

## 2020-04-30 ENCOUNTER — Other Ambulatory Visit: Payer: Self-pay

## 2020-05-06 ENCOUNTER — Encounter: Payer: Self-pay | Admitting: *Deleted

## 2020-05-17 ENCOUNTER — Other Ambulatory Visit: Payer: Self-pay

## 2020-05-17 ENCOUNTER — Ambulatory Visit (INDEPENDENT_AMBULATORY_CARE_PROVIDER_SITE_OTHER): Payer: Medicare HMO | Admitting: Pharmacist Clinician (PhC)/ Clinical Pharmacy Specialist

## 2020-05-17 DIAGNOSIS — I823 Embolism and thrombosis of renal vein: Secondary | ICD-10-CM

## 2020-05-17 DIAGNOSIS — Z7901 Long term (current) use of anticoagulants: Secondary | ICD-10-CM | POA: Diagnosis not present

## 2020-05-17 LAB — POCT INR: INR: 1.9 — AB (ref 2.0–3.0)

## 2020-05-22 ENCOUNTER — Other Ambulatory Visit: Payer: Self-pay | Admitting: Family Medicine

## 2020-05-28 ENCOUNTER — Telehealth: Payer: Self-pay | Admitting: Pharmacist Clinician (PhC)/ Clinical Pharmacy Specialist

## 2020-05-28 ENCOUNTER — Encounter: Payer: Self-pay | Admitting: Pharmacist Clinician (PhC)/ Clinical Pharmacy Specialist

## 2020-05-28 DIAGNOSIS — M5416 Radiculopathy, lumbar region: Secondary | ICD-10-CM | POA: Diagnosis not present

## 2020-05-28 MED ORDER — ENOXAPARIN SODIUM 80 MG/0.8ML ~~LOC~~ SOLN: 80.0000 mg | Freq: Two times a day (BID) | SUBCUTANEOUS | 0 refills | Status: DC

## 2020-05-28 NOTE — Telephone Encounter (Signed)
Patient called to report needing spinal injection Feb 23.  He is always bridged when holding warfarin and familiar with the process.    Feb 17: Last dose of warfarin.  Feb 18: No warfarin or enoxaparin (Lovenox).  Feb 19: Inject enoxaparin 80 mg in the fatty abdominal tissue at least 2 inches from the belly button twice a day about 12 hours apart, 8am and 8pm rotate sites. No warfarin.  Feb 20: Inject enoxaparin in the fatty tissue every 12 hours, 8am and 8pm. No warfarin.  Feb 21: Inject enoxaparin in the fatty tissue every 12 hours, 8am and 8pm. No warfarin.  Feb 22: Inject enoxaparin in the fatty tissue in the morning at 8 am (No PM dose). No warfarin.  Feb 23: Procedure Day - No enoxaparin - Resume warfarin 5 mg in the evening or as directed by doctor   Feb 24: Resume enoxaparin inject in the fatty tissue every 12 hours and take warfarin 7.5 mg  Feb 25: Inject enoxaparin in the fatty tissue every 12 hours and take warfarin 7.5 mg  Feb 26: Inject enoxaparin in the fatty tissue every 12 hours and take warfarin 7.5 mg   Feb 27: Inject enoxaparin in the fatty tissue every 12 hours and take warfarin 5 mg  Feb 28: Inject enoxaparin in the fatty tissue in the morning.  INR appointment at 8:30 am

## 2020-06-04 ENCOUNTER — Telehealth: Payer: Self-pay | Admitting: Pharmacist Clinician (PhC)/ Clinical Pharmacy Specialist

## 2020-06-04 NOTE — Telephone Encounter (Signed)
I s/w Shelly with [employer]. I did ask we does the pt need to hold ASA as well as Warfarin. Per Shelly, pt does not need to hold ASA. I will let the pre op team know.

## 2020-06-04 NOTE — Telephone Encounter (Signed)
Patient with diagnosis of CVA and renal vein thrombosis on warfarin for anticoagulation.    Procedure: lumbar interlaminar epidural injection L4-L5 Date of procedure: 06/16/20  CrCl 82.8 Platelet count 252  Per office protocol, patient can hold warfarin for 5 days prior to procedure.    Patient WILL need bridging with Lovenox (enoxaparin) around procedure.   Patient followed by [facility].  Patient aware of need for bridging.  Has been given dosing schedule and prescription.  Appointment scheduled for post- procedure INR check.

## 2020-06-04 NOTE — Telephone Encounter (Signed)
Kenneth Cline from requesting office just called back stating the anesthesiology would prefer if ASA could be held if we could give recommendations on how long to hold ASA and when to resume. I assured Kenneth Cline, I will get a message back to the pre op provider to add instructions about ASA. Kenneth Cline thanked me for the help.

## 2020-06-04 NOTE — Telephone Encounter (Signed)
   [facility] Pre-operative Risk Assessment    HEARTCARE STAFF: - Please ensure there is not already an duplicate clearance open for this procedure. - Under Visit Info/Reason for Call, type in Other and utilize the format Clearance MM/DD/YY or Clearance TBD. Do not use dashes or single digits. - If request is for dental extraction, please clarify the # of teeth to be extracted.  Request for surgical clearance:  1. What type of surgery is being performed? Lumbar interlaminar epidural injection L4-L5  2. When is this surgery scheduled? 06/16/20  3. What type of clearance is required (medical clearance vs. Pharmacy clearance to hold med vs. Both)? pharmacy  4. Are there any medications that need to be held prior to surgery and how long? warfarin  5. Practice name and name of physician performing surgery? [facility]  6. What is the office phone number? [phone]   7.   What is the office fax number? [phone]  8.   Anesthesia type (None, local, MAC, general) ?    Tommy Medal 06/04/2020, 9:37 AM  _________________________________________________________________   (provider comments below)

## 2020-06-04 NOTE — Telephone Encounter (Signed)
Kenneth Cline, I was giving today's clearances the once over and although we acted on warfarin for this patient, the original clearance did not include commentary on aspirin. Can you call the requesting team to find out if this patient would have to hold aspirin as well? We only have it listed on our med list as "as directed" so not clear if patient is taking it daily. He does not have any cardiac problems but our clinic follows him for family history. He is also followed by our coumadin clinic for prior clotting issues. Thanks.

## 2020-06-04 NOTE — Telephone Encounter (Signed)
.    Primary Cardiologist: Glenetta Hew, MD  Chart reviewed as part of pre-operative protocol coverage. Patient was contacted 06/04/2020 in reference to pre-operative risk assessment for pending surgery as outlined below.  Kenneth Cline was last seen on 02/11/20 Dr.Harding with history of HTN, HLD, recurrent cholangitis, lupus anticoagulant, history of renal vein thrombosis, CVA, family hx of CAD amongst several other medical problems outlined. EF normal in 2016, no other significant cardiac history noted. I reached out to patient for update on how he is doing. The patient affirms he has been doing well without any new cardiac symptoms. Therefore, based on ACC/AHA guidelines, the patient would be at acceptable risk for the planned procedure without further cardiovascular testing. The patient was advised that if he develops new symptoms prior to surgery to contact our office to arrange for a follow-up visit, and he verbalized understanding.  Per pharmacist review, "Per office protocol, patient can hold warfarin for 5 days prior to procedure. Patient WILL need bridging with Lovenox (enoxaparin) around procedure. Patient followed by [facility].  Patient aware of need for bridging.  Has been given dosing schedule and prescription.  Appointment scheduled for post- procedure INR check."   I will route this recommendation to the requesting party via Epic fax function and remove from pre-op pool. Please call with questions.  Charlie Pitter, PA-C 06/04/2020, 10:16 AM

## 2020-06-04 NOTE — Telephone Encounter (Addendum)
   Chart revisited due to need to hold aspirin. From cardiac standpoint patient does not appear to be on this for cardiac reasons of MI, PCI or prior CABG. He does have prior stroke while on Xarelto in the context of his hypercoagulable disorder but was changed to warfarin since that time. I reviewed with Dr. Ellyn Hack by phone. Since the patient will be bridged with Lovenox, Dr. Ellyn Hack is fine holding the aspirin for the time deemed necessary by the surgical team (usually 1 week but will defer to surgeon). We typically recommend this is restarted when felt safe by performing physician.   Will route this bundled recommendation to requesting provider via Epic fax function. Please call with questions.  Lorel Lembo PA-C

## 2020-06-04 NOTE — Telephone Encounter (Signed)
Kenneth Cline - I just gave this patient a quick call to ensure no new clinical changes and he's doing great. I finalized the clearance with your input. The patient did ask if he could speak with you again about his INR plan if you can give him a call back when you get a sec. Thanks!

## 2020-06-04 NOTE — Telephone Encounter (Addendum)
Thank you! No need to update prior clearance then. Will close out again.

## 2020-06-14 ENCOUNTER — Other Ambulatory Visit: Payer: Self-pay

## 2020-06-14 ENCOUNTER — Ambulatory Visit (INDEPENDENT_AMBULATORY_CARE_PROVIDER_SITE_OTHER): Payer: Medicare HMO

## 2020-06-14 DIAGNOSIS — Z5181 Encounter for therapeutic drug level monitoring: Secondary | ICD-10-CM

## 2020-06-14 DIAGNOSIS — I823 Embolism and thrombosis of renal vein: Secondary | ICD-10-CM

## 2020-06-14 DIAGNOSIS — Z7901 Long term (current) use of anticoagulants: Secondary | ICD-10-CM | POA: Diagnosis not present

## 2020-06-14 LAB — POCT INR: INR: 1.2 — AB (ref 2.0–3.0)

## 2020-06-14 NOTE — Patient Instructions (Signed)
Continue with 5mg  daily expect for 7.5mg  on Mondays and Fridays.  Recheck INR in 1 weeks. Follow Lovenox Bridging for 2/23 procedure.

## 2020-06-16 DIAGNOSIS — M5416 Radiculopathy, lumbar region: Secondary | ICD-10-CM | POA: Diagnosis not present

## 2020-06-21 ENCOUNTER — Other Ambulatory Visit: Payer: Self-pay

## 2020-06-21 ENCOUNTER — Ambulatory Visit (INDEPENDENT_AMBULATORY_CARE_PROVIDER_SITE_OTHER): Payer: Medicare HMO | Admitting: Pharmacist

## 2020-06-21 DIAGNOSIS — Z7901 Long term (current) use of anticoagulants: Secondary | ICD-10-CM | POA: Diagnosis not present

## 2020-06-21 DIAGNOSIS — I823 Embolism and thrombosis of renal vein: Secondary | ICD-10-CM | POA: Diagnosis not present

## 2020-06-21 LAB — POCT INR: INR: 1.3 — AB (ref 2.0–3.0)

## 2020-06-23 ENCOUNTER — Other Ambulatory Visit: Payer: Self-pay

## 2020-06-23 ENCOUNTER — Ambulatory Visit (INDEPENDENT_AMBULATORY_CARE_PROVIDER_SITE_OTHER): Payer: Medicare Other

## 2020-06-23 DIAGNOSIS — Z5181 Encounter for therapeutic drug level monitoring: Secondary | ICD-10-CM | POA: Diagnosis not present

## 2020-06-23 DIAGNOSIS — Z7901 Long term (current) use of anticoagulants: Secondary | ICD-10-CM | POA: Diagnosis not present

## 2020-06-23 DIAGNOSIS — I823 Embolism and thrombosis of renal vein: Secondary | ICD-10-CM | POA: Diagnosis not present

## 2020-06-23 LAB — POCT INR: INR: 1.7 — AB (ref 2.0–3.0)

## 2020-06-23 NOTE — Patient Instructions (Signed)
Lovenox today only and then discontinue and Take 10 mg today only and then continue 1 tablet Daily except 1.5 on Monday and Friday..  Repeat in 1 week

## 2020-07-02 ENCOUNTER — Ambulatory Visit (INDEPENDENT_AMBULATORY_CARE_PROVIDER_SITE_OTHER): Payer: Medicare Other

## 2020-07-02 ENCOUNTER — Other Ambulatory Visit: Payer: Self-pay

## 2020-07-02 DIAGNOSIS — Z7901 Long term (current) use of anticoagulants: Secondary | ICD-10-CM

## 2020-07-02 DIAGNOSIS — Z5181 Encounter for therapeutic drug level monitoring: Secondary | ICD-10-CM | POA: Diagnosis not present

## 2020-07-02 DIAGNOSIS — I823 Embolism and thrombosis of renal vein: Secondary | ICD-10-CM

## 2020-07-02 LAB — POCT INR: INR: 1.9 — AB (ref 2.0–3.0)

## 2020-07-02 NOTE — Patient Instructions (Signed)
Take 2.5 tablets tonight only and then continue 1 tablet Daily except 1.5 on Monday and Friday..  Repeat in 4 weeks

## 2020-07-03 ENCOUNTER — Other Ambulatory Visit: Payer: Self-pay | Admitting: Family Medicine

## 2020-07-29 ENCOUNTER — Other Ambulatory Visit: Payer: Self-pay | Admitting: Cardiology

## 2020-07-29 ENCOUNTER — Other Ambulatory Visit: Payer: Self-pay | Admitting: *Deleted

## 2020-07-29 ENCOUNTER — Telehealth: Payer: Self-pay | Admitting: Cardiology

## 2020-07-29 MED ORDER — GABAPENTIN 300 MG PO CAPS: ORAL_CAPSULE | ORAL | 4 refills | Status: DC

## 2020-07-29 MED ORDER — WARFARIN SODIUM 5 MG PO TABS: ORAL_TABLET | ORAL | 1 refills | Status: DC

## 2020-07-29 NOTE — Telephone Encounter (Signed)
*  STAT* If patient is at the pharmacy, call can be transferred to refill team.   1. Which medications need to be refilled? (please list name of each medication and dose if known)  warfarin (COUMADIN) 5 MG tablet  2. Which pharmacy/location (including street and city if local pharmacy) is medication to be sent to?  on  3. Do they need a 30 day or 90 day supply? [place]

## 2020-07-29 NOTE — Telephone Encounter (Signed)
*  STAT* If patient is at the pharmacy, call can be transferred to refill team.   1. Which medications need to be refilled? (please list name of each medication and dose if known)  ezetimibe (ZETIA) 10 MG tablet  2. Which pharmacy/location (including street and city if local pharmacy) is medication to be sent to? [facility] [address] 3. Do they need a 30 day or 90 day supply? [facility]

## 2020-07-30 ENCOUNTER — Ambulatory Visit (INDEPENDENT_AMBULATORY_CARE_PROVIDER_SITE_OTHER): Payer: Medicare Other

## 2020-07-30 ENCOUNTER — Other Ambulatory Visit: Payer: Self-pay

## 2020-07-30 DIAGNOSIS — I823 Embolism and thrombosis of renal vein: Secondary | ICD-10-CM

## 2020-07-30 DIAGNOSIS — Z5181 Encounter for therapeutic drug level monitoring: Secondary | ICD-10-CM | POA: Diagnosis not present

## 2020-07-30 DIAGNOSIS — Z7901 Long term (current) use of anticoagulants: Secondary | ICD-10-CM

## 2020-07-30 LAB — POCT INR: INR: 2.1 (ref 2.0–3.0)

## 2020-07-30 NOTE — Telephone Encounter (Signed)
Medication was sent 07/29/2020 with 90 day supply having 1 refill to [place]

## 2020-07-30 NOTE — Patient Instructions (Signed)
continue 1 tablet Daily except 1.5 on Monday and Friday..  Repeat in 6 weeks

## 2020-09-10 ENCOUNTER — Ambulatory Visit (INDEPENDENT_AMBULATORY_CARE_PROVIDER_SITE_OTHER): Payer: Medicare Other

## 2020-09-10 ENCOUNTER — Other Ambulatory Visit: Payer: Self-pay

## 2020-09-10 DIAGNOSIS — Z7901 Long term (current) use of anticoagulants: Secondary | ICD-10-CM | POA: Diagnosis not present

## 2020-09-10 DIAGNOSIS — I823 Embolism and thrombosis of renal vein: Secondary | ICD-10-CM

## 2020-09-10 DIAGNOSIS — Z5181 Encounter for therapeutic drug level monitoring: Secondary | ICD-10-CM

## 2020-09-10 LAB — POCT INR: INR: 2.3 (ref 2.0–3.0)

## 2020-09-10 NOTE — Patient Instructions (Signed)
continue 1 tablet Daily except 1.5 on Monday and Friday..  Repeat in 7 weeks

## 2020-09-17 ENCOUNTER — Other Ambulatory Visit: Payer: Self-pay | Admitting: *Deleted

## 2020-09-17 MED ORDER — SERTRALINE HCL 50 MG PO TABS: 1.0000 | ORAL_TABLET | Freq: Every day | ORAL | 1 refills | Status: DC

## 2020-09-23 ENCOUNTER — Telehealth: Payer: Self-pay | Admitting: Family Medicine

## 2020-09-23 NOTE — Telephone Encounter (Signed)
Pt called in stating that he needs a refill of  sertraline (ZOLOFT) 50 MG tablet    Cb#:[phone]

## 2020-10-01 ENCOUNTER — Other Ambulatory Visit: Payer: Self-pay | Admitting: *Deleted

## 2020-10-01 MED ORDER — SERTRALINE HCL 50 MG PO TABS: 1.0000 | ORAL_TABLET | Freq: Every day | ORAL | 1 refills | Status: DC

## 2020-11-01 ENCOUNTER — Ambulatory Visit (INDEPENDENT_AMBULATORY_CARE_PROVIDER_SITE_OTHER): Payer: Medicare Other

## 2020-11-01 ENCOUNTER — Other Ambulatory Visit: Payer: Self-pay

## 2020-11-01 DIAGNOSIS — Z7901 Long term (current) use of anticoagulants: Secondary | ICD-10-CM | POA: Diagnosis not present

## 2020-11-01 DIAGNOSIS — Z5181 Encounter for therapeutic drug level monitoring: Secondary | ICD-10-CM

## 2020-11-01 DIAGNOSIS — I823 Embolism and thrombosis of renal vein: Secondary | ICD-10-CM

## 2020-11-01 LAB — POCT INR: INR: 2.5 (ref 2.0–3.0)

## 2020-11-01 NOTE — Patient Instructions (Signed)
continue 1 tablet Daily except 1.5 on Monday and Friday..  Repeat in 8 weeks

## 2020-11-18 ENCOUNTER — Telehealth: Payer: Self-pay

## 2020-11-18 ENCOUNTER — Telehealth: Payer: Self-pay | Admitting: Cardiology

## 2020-11-18 NOTE — Telephone Encounter (Signed)
The patient called in today. He stated that he went to the local fire department when he started having feelings of light headedness while working in his garage. They took his vitals and did an EKG. EKG was normal.   Blood pressure was 150/90 and heart rate was 47. CBG was 171.  He stated that he was told that his pulse was erratic but they did not mention what rhythm it was.   He stated that normally his heart rate runs in the 90's. He denies any other symptoms and stated that he does feel better now. He has never had this type of episode before. He has been advised to take it easy today and that we would call back with recommendations. If  he starts feeling bad again then he will go back to the fire department for reassessment.

## 2020-11-18 NOTE — Telephone Encounter (Signed)
.  STAT if HR is under 50 or over 120 (normal HR is 60-100 beats per minute)  What is your heart rate? 54 last one  Do you have a log of your heart rate readings (document readings)? Ranging high 40's and low 50's  Do you have any other symptoms? States he has had high BP this morning, but right now it's fine. States he does feel some lightheadedness, but does not feel like fainting   Patient states he was just seen by EMS for his low and erratic HR. He states they took an EKG and told him to contact the office to be seen this week. I offered him a virtual appointment with Dr. Ellyn Hack, but he refused. He states his HR today went into the 40's.

## 2020-11-18 NOTE — Telephone Encounter (Signed)
Pt called in stating that he pulse was reading low today and having a headache. Pt would like nurse/dr to give a call back if possible today to discuss if he should go to urgent care. Please advise  Cb#: [phone]

## 2020-11-18 NOTE — Telephone Encounter (Signed)
Call placed to patient to inquire.   Reports that BP noted elevated, but HR was low on home monitor this morning. 148/88, HR 44.  Reports that he also had a sharp HA, and was feeling very fatigued.   Reports that he went to local fire department and had EMT evaluate. Reports that EKG was obtained that showed irregular beats. States that he was advised to go to ER, but he declined at that time.   HA has improved, but has not completely resolved. Fatigue continues to be present. States that HR has been rapidly changing between 40's and 80's.   Advised to go to ER. Patient declines at this time. States that he will see how he feels tomorrow. Advised that irregular heart beats means more work load for the heart and less oxygen for the organs. Advised this is emergent situation and would need to be treated as such. Reports that he has called cardiology and will wait for their recommendations.   Of note, patient is currently anticoagulated with coumadin for renal vein thrombosis.

## 2020-11-18 NOTE — Telephone Encounter (Signed)
Difficult to tell unless we have an EKG.  His blood pressure is not really usually [place] high nor has his heart rate usually been low.  He is not on any medications that would explain it.  If he has no further episodes I will probably would not worry about it, however if this continues we will probably need to have him wear a monitor to assess heart rate.  Sometimes the blood pressure goes up in response to the heart rate going down. Glenetta Hew, MD

## 2020-11-19 ENCOUNTER — Encounter: Payer: Self-pay | Admitting: Family Medicine

## 2020-11-19 ENCOUNTER — Other Ambulatory Visit: Payer: Self-pay

## 2020-11-19 ENCOUNTER — Ambulatory Visit (INDEPENDENT_AMBULATORY_CARE_PROVIDER_SITE_OTHER): Payer: Medicare Other | Admitting: Family Medicine

## 2020-11-19 VITALS — BP 144/78 | HR 50 | Temp 98.1°F | Resp 14 | Ht 69.0 in | Wt 188.0 lb

## 2020-11-19 DIAGNOSIS — I499 Cardiac arrhythmia, unspecified: Secondary | ICD-10-CM

## 2020-11-19 DIAGNOSIS — R002 Palpitations: Secondary | ICD-10-CM | POA: Diagnosis not present

## 2020-11-19 DIAGNOSIS — E039 Hypothyroidism, unspecified: Secondary | ICD-10-CM | POA: Diagnosis not present

## 2020-11-19 LAB — MAGNESIUM: Magnesium: 1.9 mg/dL (ref 1.5–2.5)

## 2020-11-19 MED ORDER — METOPROLOL SUCCINATE ER 25 MG PO TB24: 25.0000 mg | ORAL_TABLET | Freq: Every day | ORAL | 3 refills | Status: DC

## 2020-11-19 NOTE — Telephone Encounter (Signed)
Spoke with wife to schedule appt per pcp referral note.    Wife states pcp added metoprolol and was concerned about PAC's   Spoke with Ivin Booty RN ok to schedule 8/11 at 830 .    Wife agreeable with appt and is aware someone from Dr. Allison Quarry office will fu to address HR concern.

## 2020-11-19 NOTE — Telephone Encounter (Signed)
Spoke to patient . Patient states blood pressure is good today .  Patient is aware Dr Ellyn Hack reviewed message from 11/18/20.  Patient is aware to continue to monitor blood pressure and heart rate and follow Dr Dennard Schaumann 's (PCP) instruction about medications.  Bring  information to office appointment 12/02/20.  If symptoms become worse  Call 911.

## 2020-11-19 NOTE — Progress Notes (Signed)
Subjective:    Patient ID: Kenneth Cline, male    DOB: [birth year], 65 y.o.   MRN: [id number]  HPI Yesterday, the patient developed a dull headache.  He checked his blood pressure and his systolic blood pressure was over 150.  He also felt like his heart was fluttering.  He went to the local fire department in [facility] where they checked his blood pressure and found that was elevated however his heart rate was fluctuating.  At times it was in the 40s and at other times it was in the 80s.  Seem to be changing rapidly.  They were checking his heart rate using a finger monitor.  Telemetry strips that the patient brings today from the fire department shows normal sinus rhythm.  Every QRS complex is preceded by P wave.  Every P wave is followed by QRS complex.  However there are frequent ectopic beats causing the irregular sensation.  These seem to be PACs.  There are P waves have different morphology.  These are occurring frequently causing his heart rate to fluctuate between 75 and 150 on the telemetry strip.  He is here today for follow-up.  He states that he can feel his heart flutter.  He denies any syncope or presyncope or chest pain or shortness of breath.  Pressure today is slightly elevated at 144/78. He has a very complicated past medical history. He has a history of lupus anticoagulant. 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  He is on chronic fludrocortisone as well as prednisone for this. He has a history of type 2 diabetes as well as hypothyroidism which is treated by endocrinology. He also has a history of acute cholangitis requiring ERCP and bile duct stenting.  This is due to primary sclerosing cholangitis  I checked an EKG today.  Again the patient has sinus rhythm.  PR interval however is short at 110 ms.  QTc 442.  There is no evidence of ischemia or infarction however the patient  does have frequent ectopic beats that appear to be PACs. 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  One sister with valve disease.   GERD (gastroesophageal reflux disease)    H/O acute cholangitis May 5993   Complicated by septic shock, acute pancreatitis and acute choledocholithiasis;    Lupus anticoagulant with hemorrhagic disorder (HCC)    Previously on Xarelto. Now on warfarin   Overweight (BMI 25.0-29.9)    PSC (primary sclerosing cholangitis)    managed by Dr. 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 Likely cholangitis   ERCP W/ PLASTIC STENT PLACEMENT  Sep 09 2014   [facility] - Dr. Alvera Novel   ERCP W/ SPHINCTEROTOMY AND BALLOON DILATION  11/10/2014   [facility] -- Removal of biliary stent,, to PACU duodenitis   LAPAROSCOPIC CHOLECYSTECTOMY W/ CHOLANGIOGRAPHY  November 13 2014   [facility], Dr. Warnell Bureau [place]   NM MYOCAR PERF WALL MOTION  06/28/2006   EF 67% , LV systolic fx norm.   ROTATOR CUFF REPAIR     TRANSTHORACIC ECHOCARDIOGRAM  04/2015   No pericardial effusion. No shunt. Full study: Basal septal LVH. Pseudo-normal filling (GR 2 DD). EF 57%.   TRANSTHORACIC ECHOCARDIOGRAM  08/2015   [facility]): Normal LV size. Basal septal hypertrophy. EF 57%. Pseudo-normal filling. Mild focal aortic valve thickening. (Study was done to evaluate for possible endocarditis)   Current Outpatient Medications on File Prior to Visit  Medication Sig Dispense Refill   aspirin EC 81 MG tablet Take 81 mg by mouth as directed.     Calcium Carb-Cholecalciferol (CALCIUM + VITAMIN D3 PO) Take by mouth as directed.     clonazePAM (KLONOPIN) 0.5 MG tablet Take 0.5 mg by mouth 2 (two) times daily as needed for anxiety.     ezetimibe (ZETIA) 10 MG tablet TAKE ONE TABLET BY MOUTH EVERY DAY 90 tablet 1   ferrous sulfate 325 (65 FE) MG tablet Take 325 mg by mouth daily with breakfast.     fludrocortisone (FLORINEF) 0.1 MG tablet Take 0.1 mg by mouth daily.     gabapentin (NEURONTIN) 300 MG capsule TAKE 1 CAPSULE BY MOUTH 3 TIMES A DAY MAY INCREASE TO UP TO 6 CAPSULES A DAY 180 capsule 4   ipratropium (ATROVENT) 0.06 % nasal spray 1-2 SPRAYS IN NOSTRILS 3 (THREE) TIMES DAILY AS NEEDED FOR RHINITIS (RUNNY NOSE/POST NASAL DRIP). 45 mL 0   levothyroxine (SYNTHROID, LEVOTHROID) 75 MCG tablet Take 37.5 mcg by mouth daily.     magnesium oxide (MAG-OX) 400 MG tablet Take 400 mg by mouth daily.     metFORMIN (GLUCOPHAGE-XR) 500 MG 24 hr tablet Take 1  tablet by mouth 2 (two) times daily.     mometasone (NASONEX) 50 MCG/ACT nasal spray Place 2 sprays into the nose daily.     pantoprazole (PROTONIX) 40 MG tablet TAKE 1 TABLET BY MOUTH TWICE A DAY 180 tablet 1   predniSONE (DELTASONE) 1 MG tablet Take by mouth. Take 42m PO Q AM and 2.547mPO Q PM     Probiotic Product (PROBIOTIC DAILY PO) Take by mouth.     traMADol (ULTRAM) 50 MG tablet Take by mouth every 6 (six) hours as needed.     ursodiol (ACTIGALL) 500 MG tablet Take 500 mg by mouth 2 (two) times daily.  5  warfarin (COUMADIN) 5 MG tablet TAKE 1 TO 1.5 TABLETS BY MOUTH DAILY AS DIRECTED BY COUMADIN CLINIC 135 tablet 1   No current facility-administered medications on file prior to visit.   Allergies  Allergen Reactions   Biaxin [Clarithromycin] Rash    SJS   Keflex [Cephalexin] Hives, Itching and Rash   Macrolides And Ketolides Other (See Comments)    Unknown   Erythromycin     Was told not to take d/t medical problems   Penicillins     REACTION: rash   Social History   Socioeconomic History   Marital status: Married    Spouse name: Not on file   Number of children: 2   Years of education: Not on file   Highest education level: Not on file  Occupational History    Employer: [employer]  Tobacco Use   Smoking status: Never   Smokeless tobacco: Never  Substance and Sexual Activity   Alcohol use: Yes    Alcohol/week: 3.0 standard drinks    Types: 3 Standard drinks or equivalent per week    Comment: occassional   Drug use: No   Sexual activity: Not on file  Other Topics Concern   Not on file  Social History Narrative   He is a married father of 2 with 3 stepchildren.  He has 3 grandchildren his own and 3 step grandchildren.  He works as a Dealer for [employer].  He lives with his wife, [name], of 8 years.  He does not, and never did smoke.  He takes occasional alcohol beverage but nothing significant.  He does not do routine exercise, but does walk a lot at work.   Social  Determinants of Health   Financial Resource Strain: Not on file  Food Insecurity: Not on file  Transportation Needs: Not on file  Physical Activity: Not on file  Stress: Not on file  Social Connections: Not on file  Intimate Partner Violence: Not on file     Review of Systems      Objective:   Physical Exam Constitutional:      General: He is not in acute distress.    Appearance: He is not diaphoretic.  HENT:     Head: Normocephalic and atraumatic.     Right Ear: External ear normal.     Left Ear: External ear normal.     Nose: Nose normal.     Mouth/Throat:     Pharynx: No oropharyngeal exudate.  Eyes:     General: No scleral icterus.       Right eye: No discharge.        Left eye: No discharge.     Conjunctiva/sclera: Conjunctivae normal.     Pupils: Pupils are equal, round, and reactive to light.  Neck:     Thyroid: No thyromegaly.     Vascular: No JVD.     Trachea: No tracheal deviation.  Cardiovascular:     Rate and Rhythm: Rhythm irregularly irregular.     Heart sounds: No murmur heard.   No friction rub. No gallop.  Pulmonary:     Breath sounds: No stridor. No rales.  Chest:     Chest wall: No tenderness.  Abdominal:     General: Bowel sounds are normal. There is no distension.     Palpations: Abdomen is soft. There is no mass.     Tenderness: There is no abdominal tenderness. There is no guarding or rebound.  Musculoskeletal:     Cervical back: Normal range  of motion and neck supple.  Lymphadenopathy:     Cervical: No cervical adenopathy.  Skin:    General: Skin is warm.     Coloration: Skin is not pale.     Findings: No erythema or rash.  Neurological:     Mental Status: He is alert and oriented to person, place, and time.     Cranial Nerves: No cranial nerve deficit.     Motor: No abnormal muscle tone.     Coordination: Coordination normal.     Deep Tendon Reflexes: Reflexes are normal and symmetric. Reflexes normal.  Psychiatric:         Behavior: Behavior normal.        Thought Content: Thought content normal.        Judgment: Judgment normal.         Assessment & Plan:  Irregular heart beats - Plan: EKG 12-Lead Patient is already anticoagulated due to his complicated past medical history.  EKG and telemetry strips that the patient brings from the fire department as well as the EKG here shows normal sinus rhythm with frequent PACs and ectopic beats.  I have recommended that we start Toprol-XL 25 mg a day to see if we can decrease the frequency of the ectopic beats.  This will also help address his blood pressure.  Given his previous history of adrenal insufficiency I would like to check his electrolytes to rule out hypokalemia or thyroid issues as a potential cause of the PACs.  Therefore I will check, CBC CMP magnesium TSH.  Consult cardiology for echocardiogram to rule out structural heart disease.  He would likely benefit from a cardiac monitor to ensure that he is not also having paroxysmal atrial fibrillation

## 2020-11-24 ENCOUNTER — Other Ambulatory Visit: Payer: Self-pay | Admitting: *Deleted

## 2020-11-24 MED ORDER — PANTOPRAZOLE SODIUM 40 MG PO TBEC: 40.0000 mg | DELAYED_RELEASE_TABLET | Freq: Two times a day (BID) | ORAL | 1 refills | Status: DC

## 2020-11-26 ENCOUNTER — Telehealth: Payer: Self-pay | Admitting: *Deleted

## 2020-11-26 ENCOUNTER — Other Ambulatory Visit: Payer: Self-pay | Admitting: *Deleted

## 2020-11-26 ENCOUNTER — Other Ambulatory Visit: Payer: Self-pay | Admitting: Family Medicine

## 2020-11-26 LAB — CBC WITH DIFFERENTIAL/PLATELET
Absolute Monocytes: 640 cells/uL (ref 200–950)
Basophils Absolute: 33 cells/uL (ref 0–200)
Basophils Relative: 0.4 %
Eosinophils Absolute: 156 cells/uL (ref 15–500)
Eosinophils Relative: 1.9 %
HCT: 44.2 % (ref 38.5–50.0)
Hemoglobin: 14.5 g/dL (ref 13.2–17.1)
Lymphs Abs: 1287 cells/uL (ref 850–3900)
MCH: 29.8 pg (ref 27.0–33.0)
MCHC: 32.8 g/dL (ref 32.0–36.0)
MCV: 90.9 fL (ref 80.0–100.0)
MPV: 9.3 fL (ref 7.5–12.5)
Monocytes Relative: 7.8 %
Neutro Abs: 6084 cells/uL (ref 1500–7800)
Neutrophils Relative %: 74.2 %
Platelets: 178 10*3/uL (ref 140–400)
RBC: 4.86 10*6/uL (ref 4.20–5.80)
RDW: 13.2 % (ref 11.0–15.0)
Total Lymphocyte: 15.7 %
WBC: 8.2 10*3/uL (ref 3.8–10.8)

## 2020-11-26 LAB — COMPLETE METABOLIC PANEL WITH GFR
AG Ratio: 1.7 (calc) (ref 1.0–2.5)
ALT: 25 U/L (ref 9–46)
AST: 21 U/L (ref 10–35)
Albumin: 3.9 g/dL (ref 3.6–5.1)
Alkaline phosphatase (APISO): 58 U/L (ref 35–144)
BUN: 24 mg/dL (ref 7–25)
CO2: 25 mmol/L (ref 20–32)
Calcium: 9 mg/dL (ref 8.6–10.3)
Chloride: 100 mmol/L (ref 98–110)
Creat: 1.19 mg/dL (ref 0.70–1.35)
Globulin: 2.3 g/dL (calc) (ref 1.9–3.7)
Glucose, Bld: 184 mg/dL — ABNORMAL HIGH (ref 65–99)
Potassium: 4 mmol/L (ref 3.5–5.3)
Sodium: 136 mmol/L (ref 135–146)
Total Bilirubin: 0.9 mg/dL (ref 0.2–1.2)
Total Protein: 6.2 g/dL (ref 6.1–8.1)
eGFR: 68 mL/min/{1.73_m2} (ref >=60)

## 2020-11-26 LAB — HEMOGLOBIN A1C W/OUT EAG: Hgb A1c MFr Bld: 7.3 % of total Hgb — ABNORMAL HIGH (ref <5.7)

## 2020-11-26 LAB — TEST AUTHORIZATION

## 2020-11-26 LAB — TSH: TSH: 3.82 mIU/L (ref 0.40–4.50)

## 2020-11-26 MED ORDER — DAPAGLIFLOZIN PROPANEDIOL 10 MG PO TABS: 10.0000 mg | ORAL_TABLET | Freq: Every day | ORAL | 3 refills | Status: DC

## 2020-11-26 NOTE — Telephone Encounter (Signed)
Call placed to patient and patient made aware.  

## 2020-11-26 NOTE — Telephone Encounter (Signed)
Call placed to patient with lab results.   States that he is willing to take [place] and will make endocrinologist at [facility] aware.   Also reports that Metoprolol is helping irregular HR and he has not had Sx since starting it.   PCP to be made aware.

## 2020-12-02 ENCOUNTER — Encounter: Payer: Self-pay | Admitting: Cardiology

## 2020-12-02 ENCOUNTER — Ambulatory Visit (INDEPENDENT_AMBULATORY_CARE_PROVIDER_SITE_OTHER): Payer: Medicare Other | Admitting: Cardiology

## 2020-12-02 ENCOUNTER — Telehealth: Payer: Self-pay | Admitting: *Deleted

## 2020-12-02 ENCOUNTER — Ambulatory Visit (INDEPENDENT_AMBULATORY_CARE_PROVIDER_SITE_OTHER): Payer: Medicare Other

## 2020-12-02 ENCOUNTER — Other Ambulatory Visit: Payer: Self-pay

## 2020-12-02 VITALS — BP 160/90 | HR 78 | Ht 68.0 in | Wt 191.6 lb

## 2020-12-02 DIAGNOSIS — I823 Embolism and thrombosis of renal vein: Secondary | ICD-10-CM

## 2020-12-02 DIAGNOSIS — I491 Atrial premature depolarization: Secondary | ICD-10-CM

## 2020-12-02 DIAGNOSIS — I10 Essential (primary) hypertension: Secondary | ICD-10-CM

## 2020-12-02 DIAGNOSIS — E785 Hyperlipidemia, unspecified: Secondary | ICD-10-CM

## 2020-12-02 DIAGNOSIS — Z7901 Long term (current) use of anticoagulants: Secondary | ICD-10-CM | POA: Diagnosis not present

## 2020-12-02 DIAGNOSIS — Z8249 Family history of ischemic heart disease and other diseases of the circulatory system: Secondary | ICD-10-CM

## 2020-12-02 NOTE — Telephone Encounter (Signed)
Received request from pharmacy for PA on Farxiga  PA submitted.   Dx:E11.9- DM, I10- HTN  Your information has been submitted to [facility]. [employer] [place] will review the request and notify you of the determination decision directly, typically within 3 business days of your submission and once all necessary information is received.  You will also receive your request decision electronically. To check for an update later, open the request again from your dashboard.  If [employer] has not responded within the specified timeframe or if you have any questions about your PA submission, contact [place] directly at [facility]) [phone] or ([place]) [phone].

## 2020-12-02 NOTE — Patient Instructions (Addendum)
Medication Instructions:  No changes   *If you need a refill on your cardiac medications before your next appointment, please call your pharmacy*   Lab Work: Not needed     Testing/Procedures: Will be mailed to your home Your physician has recommended that you wear a holter monitor 7 days ZIO. Holter monitors are medical devices that record the heart's electrical activity. Doctors most often use these monitors to diagnose arrhythmias. Arrhythmias are problems with the speed or rhythm of the heartbeat. The monitor is a small, portable device. You can wear one while you do your normal daily activities. This is usually used to diagnose what is causing palpitations/syncope (passing out).    Follow-Up: At [facility], you and your health needs are our priority.  As part of our continuing mission to provide you with exceptional heart care, we have created designated Provider Care Teams.  These Care Teams include your primary Cardiologist (physician) and Advanced Practice Providers (APPs -  Physician Assistants and Nurse Practitioners) who all work together to provide you with the care you need, when you need it.     Your next appointment:   2 month(s)  The format for your next appointment:   In Person or virtual  Provider:   Glenetta Hew, MD   Other Instructions  Keep a log of blood pressure reading and bring reading to next appointment     [place] Monitor Instructions  Your physician has requested you wear a ZIO patch monitor for 7 days.  This is a single patch monitor. [employer] supplies one patch monitor per enrollment. Additional stickers are not available. Please do not apply patch if you will be having a Nuclear Stress Test,  Echocardiogram, Cardiac CT, MRI, or Chest Xray during the period you would be wearing the  monitor. The patch cannot be worn during these tests. You cannot remove and re-apply the  ZIO XT patch monitor.  Your ZIO patch monitor will be  mailed 3 day USPS to your address on file. It may take 3-5 days  to receive your monitor after you have been enrolled.  Once you have received your monitor, please review the enclosed instructions. Your monitor  has already been registered assigning a specific monitor serial # to you.  Billing and Patient Assistance Program Information  We have supplied [employer] with any of your insurance information on file for billing purposes. [employer] offers a sliding scale Patient Assistance Program for patients that do not have  insurance, or whose insurance does not completely cover the cost of the ZIO monitor.  You must apply for the Patient Assistance Program to qualify for this discounted rate.  To apply, please call [employer] at [phone], select option 4, select option 2, ask to apply for  Patient Assistance Program. Theodore Demark will ask your household income, and how many people  are in your household. They will quote your out-of-pocket cost based on that information.  [employer] will also be able to set up a 19-month interest-free payment plan if needed.  Applying the monitor   Shave hair from upper left chest.  Hold abrader disc by orange tab. Rub abrader in 40 strokes over the upper left chest as  indicated in your monitor instructions.  Clean area with 4 enclosed alcohol pads. Let dry.  Apply patch as indicated in monitor instructions. Patch will be placed under collarbone on left  side of chest with arrow pointing upward.  Rub patch adhesive wings for 2 minutes. Remove white  label marked "1". Remove the white  label marked "2". Rub patch adhesive wings for 2 additional minutes.  While looking in a mirror, press and release button in center of patch. A small green light will  flash 3-4 times. This will be your only indicator that the monitor has been turned on.  Do not shower for the first 24 hours. You may shower after the first 24 hours.  Press the button if you feel a symptom. You will hear  a small click. Record Date, Time and  Symptom in the Patient Logbook.  When you are ready to remove the patch, follow instructions on the last 2 pages of Patient  Logbook. Stick patch monitor onto the last page of Patient Logbook.  Place Patient Logbook in the blue and white box. Use locking tab on box and tape box closed  securely. The blue and white box has prepaid postage on it. Please place it in the mailbox as  soon as possible. Your physician should have your test results approximately 7 days after the  monitor has been mailed back to [facility].  Call [place] at [phone] if you have questions regarding  your ZIO XT patch monitor. Call them immediately if you see an orange light blinking on your  monitor.  If your monitor falls off in less than 4 days, contact our Monitor department at [phone].  If your monitor becomes loose or falls off after 4 days call [employer] at [phone] for  suggestions on securing your monitor

## 2020-12-02 NOTE — Progress Notes (Unsigned)
Patient enrolled for [employer] to mail a ZIO XT monitor to his address on file.

## 2020-12-02 NOTE — Progress Notes (Signed)
Primary Care Provider: Susy Frizzle, MD Cardiologist: Glenetta Hew, MD Electrophysiologist: None  Clinic Note: Chief Complaint  Patient presents with   Palpitations    Work in visit to discuss palpitations and slow heart rate    ===================================  ASSESSMENT/PLAN   Problem List Items Addressed This Visit       Cardiology Problems   Hypertension, essential, benign (Chronic)    Blood pressure is high again today.  He indicated that he was somewhat anxious about coming in.  He also had rushed to get here.  He does not routinely check his blood pressures at home.  Have asked that he maintain a blood pressure log.  We may need to do additional therapy beyond Toprol.  Can discuss at follow-up.      PAC (premature atrial contraction) - Primary    He has had EKGs now at his PCPs office, rhythm strip from the fire department and today he was in bigeminy.  I think the symptoms seem to be improving with the low-dose Toprol.  We do need to know what the PAC burden is and also determine his true bradycardic rate.  Plan: 7-day Zio patch.  Continue Toprol for now.      Relevant Orders   EKG 12-Lead   LONG TERM MONITOR (3-14 DAYS)   Renal vein thrombosis (HCC) (Chronic)    Thought to be related to his recurrent cholangitis as well as the present coagulant.  Now on lifelong warfarin.  Stable INR checks.        Other   Dyslipidemia (Chronic)    His last lipid evaluation was in December 2021 with a total cholesterol 213 and LDL of 132.  He is on Zetia.  Hopefully will have lipids checked soon by his PCP.  We may need to consider options for treatment.  He does have a significant family history of CAD.  We can discuss coronary calcium score evaluation when he is seen in follow-up.  This will help [place] target LDL level.      Family history of premature coronary artery disease (Chronic)   Long term (current) use of anticoagulants (Chronic)     ===================================  HPI:    Kenneth Cline is a 65 y.o. male with a PMH Notable for HTN, HLD, Lupus Anticoagulant (on warfarin with history of renal vein thrombosis), recurrent cholangitis That, strong family history of CAD who is being seen today for the evaluation of IRREGULAR HEARTBEAT at the request of Susy Frizzle, MD.  Kenneth Cline was last seen on February 11, 2020 for annual follow-up.  Doing fairly well.  Concerned about COVID-19.  Had retired, and therefore not as active.  Started getting back into doing more activities around the house, yard work and working in his workshop.  Not doing routine activity.  Rare brief tachycardia spells maybe once or twice a year do not last more than 1 or 2 minutes.  Somewhat deconditioned.  Recent Hospitalizations: None  Reviewed  CV studies:    The following studies were reviewed today: (if available, images/films reviewed: From Epic Chart or Care Everywhere) None:   Interval History:   Kenneth Cline for evaluation of irregular heartbeat.  He went to the fire department because he had a fluttering sensation off and on in his chest all day on July 28.  He felt like his heart was going up but then it was also going down.  He felt his pulse and it was in the 40s.  He did not any syncope or near syncope but just a little bit of lightheadedness and fatigue.    He called in on July 28 stating that he went to the local fire department: EKG and telemetry strips from the fire department as well as EKG at PCPs office showed sinus rhythm with frequent ectopy/PACs.  Blood pressure was 150/90 with a heart rate of 47 and CBG was 71.  (Pulse rate was erratic-at PCPs office actually was faster).  PCP started him on Toprol-XL 25 mg.  I recommend that he come in for evaluation and consideration monitor.  He has had this symptom off and on few times since then.  He says that the irregular heartbeats has improved since he started  Toprol.  CV Review of Symptoms (Summary) Cardiovascular ROS: no chest pain or dyspnea on exertion positive for - irregular heartbeat, palpitations, and associated lightheadedness and a little dizziness with fatigue. negative for - edema, orthopnea, paroxysmal nocturnal dyspnea, rapid heart rate, shortness of breath, or syncope/near syncope or TIA/amaurosis fugax complication  REVIEWED OF SYSTEMS   Review of Systems  Constitutional:  Positive for malaise/fatigue (Only when he is feeling these fluttering sensations). Negative for chills, fever and weight loss.  HENT:  Negative for congestion and sinus pain.   Respiratory:  Negative for cough and shortness of breath.   Cardiovascular:  Negative for claudication and leg swelling.  Gastrointestinal:  Negative for abdominal pain, blood in stool, constipation and melena.  Genitourinary:  Negative for hematuria.  Musculoskeletal:  Negative for back pain, falls and joint pain.  Neurological:  Positive for dizziness and weakness (Little generalized weakness that made him go to the fire department). Negative for focal weakness and loss of consciousness.  Psychiatric/Behavioral:  Negative for depression and memory loss. The patient is nervous/anxious (Somewhat anxious today.). The patient does not have insomnia.    I have reviewed and (if needed) personally updated the patient's problem list, medications, allergies, past medical and surgical history, social and family history.   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  One sister with valve disease.   GERD (gastroesophageal reflux disease)    H/O acute cholangitis May 3149   Complicated by septic shock, acute pancreatitis and acute choledocholithiasis;    Lupus anticoagulant with hemorrhagic disorder (HCC)    Previously on Xarelto. Now on warfarin   Overweight (BMI 25.0-29.9)    PSC (primary sclerosing cholangitis)    managed by Dr. 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 Likely cholangitis   ERCP W/ PLASTIC STENT PLACEMENT  Sep 09 2014   [facility] - Dr. Alvera Novel   ERCP W/ SPHINCTEROTOMY AND BALLOON DILATION  11/10/2014   [facility] -- Removal of biliary stent,, to PACU duodenitis   LAPAROSCOPIC CHOLECYSTECTOMY W/ CHOLANGIOGRAPHY  November 13 2014   [facility], Dr. Warnell Bureau [place]   NM MYOCAR PERF WALL MOTION  06/28/2006   EF 94% , LV systolic fx norm.   ROTATOR CUFF REPAIR      TRANSTHORACIC ECHOCARDIOGRAM  04/2015   No pericardial effusion. No shunt. Full study: Basal septal LVH. Pseudo-normal filling (GR 2 DD). EF 57%.   TRANSTHORACIC ECHOCARDIOGRAM  08/2015   [facility]): Normal LV size. Basal septal hypertrophy. EF 57%. Pseudo-normal filling. Mild focal aortic valve thickening. (Study was done to evaluate for possible endocarditis)    Immunization History  Administered Date(s) Administered   DTP / HiB 01/01/2013   Influenza Inj Mdck Quad Pf 12/31/2017, 12/31/2017   Influenza Whole 01/01/2013   Influenza, Quadrivalent, Recombinant, Inj, Pf 12/28/2014   Influenza,inj,Quad PF,6+ Mos 01/10/2017, 12/25/2018   Influenza,inj,quad, With Preservative 12/28/2014   Influenza-Unspecified 01/01/2013, 12/28/2014, 01/10/2017, 12/31/2017   PFIZER(Purple Top)SARS-COV-2 Vaccination 06/08/2019, 07/02/2019, 02/23/2020   PPD Test 09/02/2015   Pneumococcal Conjugate-13 03/17/2017, 04/05/2017   Td 01/29/2020   Zoster Recombinat (Shingrix) 03/17/2017, 07/16/2017    MEDICATIONS/ALLERGIES   Current Meds  Medication Sig   aspirin EC 81 MG tablet Take 81 mg by mouth as directed.   Calcium Carb-Cholecalciferol (CALCIUM + VITAMIN D3 PO) Take by mouth as directed.   clonazePAM (KLONOPIN) 0.5 MG tablet Take 0.5 mg by mouth 2 (two) times daily as needed for anxiety.   dapagliflozin propanediol (FARXIGA) 10 MG TABS tablet Take 1 tablet (10 mg total) by mouth daily before breakfast.   ezetimibe (ZETIA) 10 MG tablet TAKE ONE TABLET BY MOUTH EVERY DAY   ferrous sulfate 325 (65 FE) MG tablet Take 325 mg by mouth daily with breakfast.   fludrocortisone (FLORINEF) 0.1 MG tablet Take 0.1 mg by mouth daily.   gabapentin (NEURONTIN) 300 MG capsule TAKE 1 CAPSULE BY MOUTH 3 TIMES A DAY MAY INCREASE TO UP TO 6 CAPSULES A DAY   ipratropium (ATROVENT) 0.06 % nasal spray 1-2 SPRAYS IN NOSTRILS 3 (THREE) TIMES DAILY AS NEEDED FOR RHINITIS (RUNNY NOSE/POST NASAL DRIP).   levothyroxine (SYNTHROID,  LEVOTHROID) 75 MCG tablet Take 37.5 mcg by mouth daily.   magnesium oxide (MAG-OX) 400 MG tablet Take 400 mg by mouth daily.   metFORMIN (GLUCOPHAGE-XR) 500 MG 24 hr tablet Take 1 tablet by mouth 2 (two) times daily.   metoprolol succinate (TOPROL-XL) 25 MG 24 hr tablet Take 1 tablet (25 mg total) by mouth daily.   mometasone (NASONEX) 50 MCG/ACT nasal spray Place 2 sprays into the nose daily.   pantoprazole (PROTONIX) 40 MG tablet Take  1 tablet (40 mg total) by mouth 2 (two) times daily.   predniSONE (DELTASONE) 1 MG tablet Take by mouth. Take 15m PO Q AM and 2.566mPO Q PM   Probiotic Product (PROBIOTIC DAILY PO) Take by mouth.   sertraline (ZOLOFT) 50 MG tablet Take 50 mg by mouth daily.   traMADol (ULTRAM) 50 MG tablet Take by mouth every 6 (six) hours as needed.   ursodiol (ACTIGALL) 500 MG tablet Take 500 mg by mouth 2 (two) times daily.   warfarin (COUMADIN) 5 MG tablet TAKE 1 TO 1.5 TABLETS BY MOUTH DAILY AS DIRECTED BY COUMADIN CLINIC    Allergies  Allergen Reactions   Biaxin [Clarithromycin] Rash    SJS   Keflex [Cephalexin] Hives, Itching and Rash   Macrolides And Ketolides Other (See Comments)    Unknown   Erythromycin     Was told not to take d/t medical problems   Penicillins     REACTION: rash    SOCIAL HISTORY/FAMILY HISTORY   Reviewed in Epic:  Pertinent findings:  Social History   Tobacco Use   Smoking status: Never   Smokeless tobacco: Never  Substance Use Topics   Alcohol use: Yes    Alcohol/week: 3.0 standard drinks    Types: 3 Standard drinks or equivalent per week    Comment: occassional   Drug use: No   Social History   Social History Narrative   He is a married father of 2 with 3 stepchildren.  He has 3 grandchildren his own and 3 step grandchildren.  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  He does not, and never did smoke.  He takes occasional alcohol beverage but nothing significant.  He does not do routine  exercise, but does walk a lot at work.    OBJCTIVE -PE, EKG, labs   Wt Readings from Last 3 Encounters:  12/02/20 191 lb 9.6 oz (86.9 kg)  11/19/20 188 lb (85.3 kg)  02/11/20 188 lb 6.4 oz (85.5 kg)    Physical Exam: BP (!) 160/90 (BP Location: Right Arm)   Pulse 78   Ht _0  (1.727 m)   Wt 191 lb 9.6 oz (86.9 kg)   SpO2 98%   BMI 29.13 kg/m  Physical Exam Vitals reviewed.  Constitutional:      General: He is not in acute distress.    Appearance: He is not ill-appearing or toxic-appearing.     Comments: Well-groomed.  Relatively healthy-appearing.  HENT:     Head: Normocephalic and atraumatic.  Neck:     Vascular: No carotid bruit, hepatojugular reflux or JVD.  Cardiovascular:     Rate and Rhythm: Normal rate and regular rhythm. Occasional Extrasystoles are present.    Pulses: Normal pulses.     Heart sounds: S1 normal and S2 normal. Heart sounds are distant. Murmur (Very soft 1/6 SEM at RUSB.) heard.    No friction rub. No gallop.  Pulmonary:     Effort: Pulmonary effort is normal. No respiratory distress.     Breath sounds: Normal breath sounds. No wheezing, rhonchi or rales.  Musculoskeletal:        General: Signs of injury (Well-healed scar from R Calf Surgery.) present. Normal range of motion.     Cervical back: Normal range of motion and neck supple.  Skin:    General: Skin is warm and dry.     Coloration: Skin is not pale.  Neurological:  General: No focal deficit present.     Mental Status: He is alert and oriented to person, place, and time. Mental status is at baseline.     Cranial Nerves: No cranial nerve deficit.     Gait: Gait normal.  Psychiatric:        Mood and Affect: Mood normal.        Behavior: Behavior normal.        Thought Content: Thought content normal.        Judgment: Judgment normal.     Adult ECG Report  Rate: 78 ;  Rhythm: normal sinus rhythm, premature atrial contractions (PAC), and normal axis, intervals and durations. ;    Narrative Interpretation: Stable  Recent Labs: Reviewed Lab Results  Component Value Date   CHOL 213 (H) 03/08/2020   HDL 46 03/08/2020   LDLCALC 132 (H) 03/08/2020   TRIG 198 (H) 03/08/2020   CHOLHDL 4.6 03/08/2020   Lab Results  Component Value Date   CREATININE 1.19 11/19/2020   BUN 24 11/19/2020   NA 136 11/19/2020   K 4.0 11/19/2020   CL 100 11/19/2020   CO2 25 11/19/2020   CBC Latest Ref Rng & Units 11/19/2020 04/30/2017 05/04/2016  WBC 3.8 - 10.8 Thousand/uL 8.2 8.7 8.4  Hemoglobin 13.2 - 17.1 g/dL 14.5 15.4 14.8  Hematocrit 38.5 - 50.0 % 44.2 45.4 44.3  Platelets 140 - 400 Thousand/uL 178 261 226   Lab Results  Component Value Date   HGBA1C 7.3 (H) 11/19/2020     Lab Results  Component Value Date   TSH 3.82 11/19/2020    ==================================================  COVID-19 Education: The signs and symptoms of COVID-19 were discussed with the patient and how to seek care for testing (follow up with PCP or arrange E-visit).    I spent a total of 28 minutes with the patient spent in direct patient consultation.  Additional time spent with chart review  / charting (studies, outside notes, etc): 16 min Total Time: n/a min  Current medicines are reviewed at length with the patient today.  (+/- concerns) Continue Toprol  This visit occurred during the SARS-CoV-2 public health emergency.  Safety protocols were in place, including screening questions prior to the visit, additional usage of staff PPE, and extensive cleaning of exam room while observing appropriate contact time as indicated for disinfecting solutions.  Notice: This dictation was prepared with Dragon dictation along with smaller phrase technology. Any transcriptional errors that result from this process are unintentional and may not be corrected upon review.  Patient Instructions / Medication Changes & Studies & Tests Ordered   Patient Instructions  Medication Instructions:  No changes   *If  you need a refill on your cardiac medications before your next appointment, please call your pharmacy*   Lab Work: Not needed     Testing/Procedures: Will be mailed to your home Your physician has recommended that you wear a holter monitor 7 days ZIO. Holter monitors are medical devices that record the heart's electrical activity. Doctors most often use these monitors to diagnose arrhythmias. Arrhythmias are problems with the speed or rhythm of the heartbeat. The monitor is a small, portable device. You can wear one while you do your normal daily activities. This is usually used to diagnose what is causing palpitations/syncope (passing out).    Follow-Up: At [facility], you and your health needs are our priority.  As part of our continuing mission to provide you with exceptional heart care, we have created designated  Provider Care Teams.  These Care Teams include your primary Cardiologist (physician) and Advanced Practice Providers (APPs -  Physician Assistants and Nurse Practitioners) who all work together to provide you with the care you need, when you need it.     Your next appointment:   2 month(s)  The format for your next appointment:   In Person or virtual  Provider:   Glenetta Hew, MD   Other Instructions  Keep a log of blood pressure reading and bring reading to next appointment     [place] Monitor Instructions  Your physician has requested you wear a ZIO patch monitor for 7 days.  This is a single patch monitor. [employer] supplies one patch monitor per enrollment. Additional stickers are not available. Please do not apply patch if you will be having a Nuclear Stress Test,  Echocardiogram, Cardiac CT, MRI, or Chest Xray during the period you would be wearing the  monitor. The patch cannot be worn during these tests. You cannot remove and re-apply the  ZIO XT patch monitor.  Your ZIO patch monitor will be mailed 3 day USPS to your address on file. It  may take 3-5 days  to receive your monitor after you have been enrolled.  Once you have received your monitor, please review the enclosed instructions. Your monitor  has already been registered assigning a specific monitor serial # to you.  Billing and Patient Assistance Program Information  We have supplied [employer] with any of your insurance information on file for billing purposes. [employer] offers a sliding scale Patient Assistance Program for patients that do not have  insurance, or whose insurance does not completely cover the cost of the ZIO monitor.  You must apply for the Patient Assistance Program to qualify for this discounted rate.  To apply, please call [employer] at [phone], select option 4, select option 2, ask to apply for  Patient Assistance Program. Theodore Demark will ask your household income, and how many people  are in your household. They will quote your out-of-pocket cost based on that information.  [employer] will also be able to set up a 98-month interest-free payment plan if needed.  Applying the monitor   Shave hair from upper left chest.  Hold abrader disc by orange tab. Rub abrader in 40 strokes over the upper left chest as  indicated in your monitor instructions.  Clean area with 4 enclosed alcohol pads. Let dry.  Apply patch as indicated in monitor instructions. Patch will be placed under collarbone on left  side of chest with arrow pointing upward.  Rub patch adhesive wings for 2 minutes. Remove white label marked "1". Remove the white  label marked "2". Rub patch adhesive wings for 2 additional minutes.  While looking in a mirror, press and release button in center of patch. A small green light will  flash 3-4 times. This will be your only indicator that the monitor has been turned on.  Do not shower for the first 24 hours. You may shower after the first 24 hours.  Press the button if you feel a symptom. You will hear a small click. Record Date, Time and  Symptom  in the Patient Logbook.  When you are ready to remove the patch, follow instructions on the last 2 pages of Patient  Logbook. Stick patch monitor onto the last page of Patient Logbook.  Place Patient Logbook in the blue and white box. Use locking tab on box and tape box closed  securely.  The blue and white box has prepaid postage on it. Please place it in the mailbox as  soon as possible. Your physician should have your test results approximately 7 days after the  monitor has been mailed back to [facility].  Call [facility] at [phone] if you have questions regarding  your ZIO XT patch monitor. Call them immediately if you see an orange light blinking on your  monitor.  If your monitor falls off in less than 4 days, contact our Monitor department at [phone].  If your monitor becomes loose or falls off after 4 days call [employer] at [phone] for  suggestions on securing your monitor    Studies Ordered:   Orders Placed This Encounter  Procedures   LONG TERM MONITOR (3-14 DAYS)   EKG 12-Lead     Glenetta Hew, M.D., M.S. Interventional Cardiologist   Pager # [phone] Phone # [phone] [address] [place], [place] [address]   Thank you for choosing [facility] at [facility]!!

## 2020-12-03 ENCOUNTER — Encounter: Payer: Self-pay | Admitting: Cardiology

## 2020-12-03 NOTE — Assessment & Plan Note (Addendum)
His last lipid evaluation was in December 2021 with a total cholesterol 213 and LDL of 132.  He is on Zetia.  Hopefully will have lipids checked soon by his PCP.  We may need to consider options for treatment.  He does have a significant family history of CAD.  We can discuss coronary calcium score evaluation when he is seen in follow-up.  This will help [place] target LDL level.

## 2020-12-03 NOTE — Assessment & Plan Note (Signed)
Thought to be related to his recurrent cholangitis as well as the present coagulant.  Now on lifelong warfarin.  Stable INR checks.

## 2020-12-03 NOTE — Assessment & Plan Note (Signed)
He has had EKGs now at his PCPs office, rhythm strip from the fire department and today he was in bigeminy.  I think the symptoms seem to be improving with the low-dose Toprol.  We do need to know what the PAC burden is and also determine his true bradycardic rate.  Plan: 7-day Zio patch.  Continue Toprol for now.

## 2020-12-03 NOTE — Assessment & Plan Note (Signed)
Blood pressure is high again today.  He indicated that he was somewhat anxious about coming in.  He also had rushed to get here.  He does not routinely check his blood pressures at home.  Have asked that he maintain a blood pressure log.  We may need to do additional therapy beyond Toprol.  Can discuss at follow-up.

## 2020-12-06 DIAGNOSIS — I491 Atrial premature depolarization: Secondary | ICD-10-CM

## 2020-12-21 LAB — POCT INR: INR: 2.2 (ref 2.0–3.0)

## 2020-12-22 ENCOUNTER — Ambulatory Visit (INDEPENDENT_AMBULATORY_CARE_PROVIDER_SITE_OTHER): Payer: Medicare Other | Admitting: Internal Medicine

## 2020-12-22 DIAGNOSIS — Z5181 Encounter for therapeutic drug level monitoring: Secondary | ICD-10-CM | POA: Diagnosis not present

## 2020-12-22 DIAGNOSIS — I823 Embolism and thrombosis of renal vein: Secondary | ICD-10-CM | POA: Diagnosis not present

## 2020-12-22 DIAGNOSIS — Z7901 Long term (current) use of anticoagulants: Secondary | ICD-10-CM | POA: Diagnosis not present

## 2021-01-05 ENCOUNTER — Ambulatory Visit (INDEPENDENT_AMBULATORY_CARE_PROVIDER_SITE_OTHER): Payer: Medicare Other | Admitting: Nurse Practitioner

## 2021-01-05 ENCOUNTER — Telehealth: Payer: Self-pay | Admitting: *Deleted

## 2021-01-05 ENCOUNTER — Ambulatory Visit
Admission: RE | Admit: 2021-01-05 | Discharge: 2021-01-05 | Disposition: A | Discharge status: Home / Self Care | Payer: Medicare Other | Source: Ambulatory Visit | Attending: Nurse Practitioner | Admitting: Nurse Practitioner

## 2021-01-05 ENCOUNTER — Telehealth: Payer: Self-pay

## 2021-01-05 ENCOUNTER — Other Ambulatory Visit: Payer: Self-pay

## 2021-01-05 ENCOUNTER — Encounter: Payer: Self-pay | Admitting: Nurse Practitioner

## 2021-01-05 VITALS — BP 110/74 | HR 63 | Ht 68.0 in | Wt 185.0 lb

## 2021-01-05 DIAGNOSIS — M25522 Pain in left elbow: Secondary | ICD-10-CM | POA: Diagnosis not present

## 2021-01-05 DIAGNOSIS — I823 Embolism and thrombosis of renal vein: Secondary | ICD-10-CM

## 2021-01-05 MED ORDER — DOXYCYCLINE HYCLATE 100 MG PO TABS: 100.0000 mg | ORAL_TABLET | Freq: Two times a day (BID) | ORAL | 0 refills | Status: DC

## 2021-01-05 NOTE — Telephone Encounter (Signed)
Called and spoke with patient regarding antibiotic , advised pt if  he has any bleeding abnormal while starting this medication to report to the ED, once he return from out of town to have his INR check.  Pt understood  with any concerns.

## 2021-01-05 NOTE — Telephone Encounter (Signed)
Received call from patient.   Reports that he would like to have ABTx sent to [facility] in [place].   I do not see an active prescription for Doxycycline.   Please clarify quantity and instructions.

## 2021-01-05 NOTE — Progress Notes (Signed)
Subjective:    Patient ID: Kenneth Cline, male    DOB: [birth year], 66 y.o.   MRN: [id number]  HPI: Kenneth Cline is a 65 y.o. male presenting for left elbow swelling and redness.  Chief Complaint  Patient presents with   Elbow Injury    Hit elbow on yesterday , swelling , using ice    ELBOW PAIN Duration: days Location: left elbow  Mechanism of injury: bumped on car Onset: gradual Severity: 5-6/10 Quality: throbbing Frequency: constant Radiation: yes Aggravating factors:  movement Alleviating factors: ice, Tylenol  Status: better Treatments attempted: Tylenol, ice  Relief with NSAIDs?:  none taken Swelling: yes Redness: yes  Warmth: yes Trauma: no Chest pain: no  Shortness of breath: no  Fever: no Nausea: no Vomiting: Decreased sensation: no Paresthesias: no Weakness: no  Allergies  Allergen Reactions   Biaxin [Clarithromycin] Rash    SJS   Keflex [Cephalexin] Hives, Itching and Rash   Macrolides And Ketolides Other (See Comments)    Unknown   Erythromycin     Was told not to take d/t medical problems   Penicillins     REACTION: rash    Outpatient Encounter Medications as of 01/05/2021  Medication Sig   aspirin EC 81 MG tablet Take 81 mg by mouth as directed.   Calcium Carb-Cholecalciferol (CALCIUM + VITAMIN D3 PO) Take by mouth as directed.   clonazePAM (KLONOPIN) 0.5 MG tablet Take 0.5 mg by mouth 2 (two) times daily as needed for anxiety.   dapagliflozin propanediol (FARXIGA) 10 MG TABS tablet Take 1 tablet (10 mg total) by mouth daily before breakfast.   ezetimibe (ZETIA) 10 MG tablet TAKE ONE TABLET BY MOUTH EVERY DAY   ferrous sulfate 325 (65 FE) MG tablet Take 325 mg by mouth daily with breakfast.   fludrocortisone (FLORINEF) 0.1 MG tablet Take 0.1 mg by mouth daily.   gabapentin (NEURONTIN) 300 MG capsule TAKE 1 CAPSULE BY MOUTH 3 TIMES A DAY MAY INCREASE TO UP TO 6 CAPSULES A DAY   ipratropium (ATROVENT) 0.06 % nasal spray 1-2 SPRAYS IN  NOSTRILS 3 (THREE) TIMES DAILY AS NEEDED FOR RHINITIS (RUNNY NOSE/POST NASAL DRIP).   levothyroxine (SYNTHROID, LEVOTHROID) 75 MCG tablet Take 37.5 mcg by mouth daily.   magnesium oxide (MAG-OX) 400 MG tablet Take 400 mg by mouth daily.   metFORMIN (GLUCOPHAGE-XR) 500 MG 24 hr tablet Take 1 tablet by mouth 2 (two) times daily.   metoprolol succinate (TOPROL-XL) 25 MG 24 hr tablet Take 1 tablet (25 mg total) by mouth daily.   mometasone (NASONEX) 50 MCG/ACT nasal spray Place 2 sprays into the nose daily.   pantoprazole (PROTONIX) 40 MG tablet Take 1 tablet (40 mg total) by mouth 2 (two) times daily.   predniSONE (DELTASONE) 1 MG tablet Take by mouth. Take '8mg'$  PO Q AM and 2.'5mg'$  PO Q PM   Probiotic Product (PROBIOTIC DAILY PO) Take by mouth.   sertraline (ZOLOFT) 50 MG tablet Take 50 mg by mouth daily.   traMADol (ULTRAM) 50 MG tablet Take by mouth every 6 (six) hours as needed.   ursodiol (ACTIGALL) 500 MG tablet Take 500 mg by mouth 2 (two) times daily.   warfarin (COUMADIN) 5 MG tablet TAKE 1 TO 1.5 TABLETS BY MOUTH DAILY AS DIRECTED BY COUMADIN CLINIC   doxycycline (VIBRA-TABS) 100 MG tablet Take 1 tablet (100 mg total) by mouth 2 (two) times daily.   No facility-administered encounter medications on file as of 01/05/2021.  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   Diarrhea 08/18/2014   Hypothyroidism 08/17/2014   Purpura ([place]) 08/16/2014   Bleeding hemorrhoids 08/16/2014   Fever 08/15/2014   Constipation 08/15/2014   Adrenal insufficiency ([place]) 08/14/2014   Hyponatremia 08/14/2014   Hypokalemia 08/14/2014   Adrenal hemorrhage ([place]) 08/11/2014   Hypertension, essential,  benign     Class: Chronic   Dyslipidemia    Overweight (BMI 25.0-29.9)    Erectile dysfunction    Family history of premature coronary artery disease    ALLERGIC REACTION 06/14/2007    Past Medical History:  Diagnosis Date   Adrenal hemorrhage [facility]) April 2016   Following shoulder surgery   Adrenal insufficiency, primary, hemorrhagic ([place]) 07/2014   due to bilateral adrenal hemorrhage   Autoimmune hepatitis ([place])    [place]   Cellulitis 09/2018   septic shock, compartment syndrome s/p fasciotomy RLE at [facility]   CVA (cerebral infarction) January 2017   3 small areas of infarct in both the cerebrum as well as the cerebellum -- , located with diplopia   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  One sister with valve disease.   GERD (gastroesophageal reflux disease)    H/O acute cholangitis May [id number]   Complicated by septic shock, acute pancreatitis and acute choledocholithiasis;    Lupus anticoagulant with hemorrhagic disorder (HCC)    Previously on Xarelto. Now on warfarin   Overweight (BMI 25.0-29.9)    PSC (primary sclerosing cholangitis)    managed by Dr. 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 Interim medical history since our last visit reviewed.  Review of Systems Per HPI unless specifically indicated above     Objective:    BP 110/74 (BP Location: Right Arm)   Pulse 63   Ht '5\' 8"'$  (1.727 m)   Wt 185 lb (83.9 kg)   SpO2 97%   BMI 28.13 kg/m   Wt Readings from Last 3 Encounters:  01/05/21 185 lb (83.9 kg)  12/02/20 191 lb 9.6 oz (86.9 kg)  11/19/20 188 lb (85.3 kg)    Physical Exam Vitals and nursing note reviewed.  Constitutional:      General: He is not in acute distress.    Appearance: Normal appearance. He is not toxic-appearing.  Musculoskeletal:        General: Swelling and  tenderness present. Normal range of motion.     Right elbow: Normal.     Left elbow: Swelling present. No effusion. Normal range of motion. Tenderness present.  Skin:    General: Skin is warm and dry.     Capillary Refill: Capillary refill takes less than 2 seconds.     Coloration: Skin is not jaundiced or pale.     Findings: Bruising, ecchymosis and erythema present. No abscess, rash or wound.          Comments: Bright red erythema and warmth noted to left elbow.  No fluctuance, oozing, or red streaking.  Scattered ecchymosis noted to bilateral arms.  Neurological:     Mental Status: He is alert and oriented to person, place, and time.     Motor: No weakness.  Psychiatric:        Mood and Affect: Mood normal.        Behavior: Behavior normal.        Thought Content: Thought content normal.        Judgment: Judgment normal.      Assessment & Plan:  1. Left elbow pain Acute. Concern for cellulitis given erythema, warmth, and tenderness.  Will check elbow x-ray to rule out acute fracture given possibly injury.  Start doxycycline 100 mg twice daily for 7 days for possible cellulitis.  Patient with multiple antibiotic allergies, although he has tolerated doxycycline in the past.  Also antimicrobial choice complicated by Coumadin use.  Doxycycline may  interact with Coumadin, so will check INR after he finishes treatment.  Typically, he follows with the anticoagulation clinic.  In the meantime, if he has any s/s bleeding, he should seek emergent care.  - DG Elbow Complete Left; Future - doxycycline (VIBRA-TABS) 100 MG tablet; Take 1 tablet (100 mg total) by mouth 2 (two) times daily.  Dispense: 14 tablet; Refill: 0  2. Thrombosis of left renal vein (HCC)  - PT with INR/Fingerstick    Follow up plan: Return in about 1 week (around 01/12/2021) for check INR.

## 2021-01-05 NOTE — Telephone Encounter (Signed)
Called  pt , Kenneth Cline ,NP she would like for to started antibiotic doxy. Pt is taking coumadin and needs to have his INR check when he return to  from being out town,if has and bleeding he will need to report to the ED were he is out town. LVM for patient to call us back.

## 2021-01-05 NOTE — Telephone Encounter (Signed)
Please disregard.   Patient was seen by NP.

## 2021-01-10 ENCOUNTER — Telehealth: Payer: Self-pay | Admitting: Family Medicine

## 2021-01-10 NOTE — Telephone Encounter (Signed)
Left message for patient to call back and schedule Medicare Annual Wellness Visit (AWV) in office.  ° °If not able to come in office, please offer to do virtually or by telephone.  Left office number and my jabber #[phone]. ° °Due for AWVI ° °Please schedule at anytime with Nurse Health Advisor. °  °

## 2021-01-11 ENCOUNTER — Other Ambulatory Visit: Payer: Self-pay

## 2021-01-11 ENCOUNTER — Telehealth: Payer: Self-pay | Admitting: Family Medicine

## 2021-01-11 MED ORDER — METOPROLOL SUCCINATE ER 50 MG PO TB24: 50.0000 mg | ORAL_TABLET | Freq: Every day | ORAL | 3 refills | Status: DC

## 2021-01-11 NOTE — Telephone Encounter (Signed)
Patient scheduled AWV on 01/13/21.

## 2021-01-11 NOTE — Telephone Encounter (Signed)
Call placed to patient to F/U on elbow.   Reports that pain/ tenderness has resolved. States that there is no open or draining areas. Reports that elbow is still warm to touch.   Inquired if he requires further ABTx.

## 2021-01-11 NOTE — Telephone Encounter (Signed)
Patient saw NP on 01/05/2021.  He is currently out of town.

## 2021-01-11 NOTE — Telephone Encounter (Signed)
Patient returned call to schedule appointment for AWV.   Please call patient to set up.

## 2021-01-11 NOTE — Telephone Encounter (Signed)
Received call from patient to reply to message he received to schedule his annual wellness visit. Patient stated provider told him his previous visit on 11/19/20 as his wellness visit. Patient also wants to know if he needs to schedule a follow up for his elbow. Still taking doxycycline (VIBRA-TABS) 100 MG tablet [[id number]]  but elbow is still warm to the touch. Last dose will be taken tomorrow morning.  Patient currently out of town and won't be returning for another week. If refill or other medication needed, patient requesting provider send rx to  [facility] [place], [place], [place], [place], [place], [place]. Phone number [phone].  Please advise patient at [phone].

## 2021-01-12 NOTE — Telephone Encounter (Signed)
Call placed to patient and patient made aware.   Patient states that area has improved and denies significant pain.

## 2021-01-13 ENCOUNTER — Other Ambulatory Visit: Payer: Self-pay

## 2021-01-13 ENCOUNTER — Ambulatory Visit (INDEPENDENT_AMBULATORY_CARE_PROVIDER_SITE_OTHER): Payer: Medicare Other

## 2021-01-13 VITALS — Ht 68.0 in | Wt 185.0 lb

## 2021-01-13 DIAGNOSIS — Z0000 Encounter for general adult medical examination without abnormal findings: Secondary | ICD-10-CM

## 2021-01-13 NOTE — Patient Instructions (Signed)
Mr. Kenneth Cline , Thank you for taking time to come for your Medicare Wellness Visit. I appreciate your ongoing commitment to your health goals. Please review the following plan we discussed and let me know if I can assist you in the future.   Screening recommendations/referrals: Colonoscopy: Done, 03/03/2020. Keep appointments as scheduled by Dr. Deborah Chalk. [name]. Recommended yearly ophthalmology/optometry visit for glaucoma screening and checkup Recommended yearly dental visit for hygiene and checkup  Vaccinations: Influenza vaccine: Done 12/25/2018. Repeat annually  Pneumococcal vaccine: Done 03/17/17 & 04/05/17 Tdap vaccine: Done 10/7/2 Repeat in 10 years  Shingles vaccine: Done 03/17/17 & 07/16/17   Covid-19: Done 06/08/19, 07/02/19 & 02/23/20  Advanced directives: Please bring a copy of your health care power of attorney and living will to the office to be added to your chart at your convenience.   Conditions/risks identified: Aim for 30 minutes of exercise or walking each day, drink 6-8 glasses of water and eat lots of fruits and vegetables.   Next appointment: Follow up in one year for your annual wellness visit. 01/19/22 @ 9:00.  Preventive Care 65 Years and Older, Male  Preventive care refers to lifestyle choices and visits with your health care provider that can promote health and wellness. What does preventive care include? A yearly physical exam. This is also called an annual well check. Dental exams once or twice a year. Routine eye exams. Ask your health care provider how often you should have your eyes checked. Personal lifestyle choices, including: Daily care of your teeth and gums. Regular physical activity. Eating a healthy diet. Avoiding tobacco and drug use. Limiting alcohol use. Practicing safe sex. Taking low doses of aspirin every day. Taking vitamin and mineral supplements as recommended by your health care provider. What happens during an annual well  check? The services and screenings done by your health care provider during your annual well check will depend on your age, overall health, lifestyle risk factors, and family history of disease. Counseling  Your health care provider may ask you questions about your: Alcohol use. Tobacco use. Drug use. Emotional well-being. Home and relationship well-being. Sexual activity. Eating habits. History of falls. Memory and ability to understand (cognition). Work and work Statistician. Screening  You may have the following tests or measurements: Height, weight, and BMI. Blood pressure. Lipid and cholesterol levels. These may be checked every 5 years, or more frequently if you are over 72 years old. Skin check. Lung cancer screening. You may have this screening every year starting at age 17 if you have a 30-pack-year history of smoking and currently smoke or have quit within the past 15 years. Fecal occult blood test (FOBT) of the stool. You may have this test every year starting at age 3. Flexible sigmoidoscopy or colonoscopy. You may have a sigmoidoscopy every 5 years or a colonoscopy every 10 years starting at age 83. Prostate cancer screening. Recommendations will vary depending on your family history and other risks. Hepatitis C blood test. Hepatitis B blood test. Sexually transmitted disease (STD) testing. Diabetes screening. This is done by checking your blood sugar (glucose) after you have not eaten for a while (fasting). You may have this done every 1-3 years. Abdominal aortic aneurysm (AAA) screening. You may need this if you are a current or former smoker. Osteoporosis. You may be screened starting at age 1 if you are at high risk. Talk with your health care provider about your test results, treatment options, and if necessary, the need for more tests.  Vaccines  Your health care provider may recommend certain vaccines, such as: Influenza vaccine. This is recommended every  year. Tetanus, diphtheria, and acellular pertussis (Tdap, Td) vaccine. You may need a Td booster every 10 years. Zoster vaccine. You may need this after age 16. Pneumococcal 13-valent conjugate (PCV13) vaccine. One dose is recommended after age 75. Pneumococcal polysaccharide (PPSV23) vaccine. One dose is recommended after age 18. Talk to your health care provider about which screenings and vaccines you need and how often you need them. This information is not intended to replace advice given to you by your health care provider. Make sure you discuss any questions you have with your health care provider. Document Released: 05/07/2015 Document Revised: 12/29/2015 Document Reviewed: 02/09/2015 Elsevier Interactive Patient Education  2017 [employer] Prevention in the Home Falls can cause injuries. They can happen to people of all ages. There are many things you can do to make your home safe and to help prevent falls. What can I do on the outside of my home? Regularly fix the edges of walkways and driveways and fix any cracks. Remove anything that might make you trip as you walk through a door, such as a raised step or threshold. Trim any bushes or trees on the path to your home. Use bright outdoor lighting. Clear any walking paths of anything that might make someone trip, such as rocks or tools. Regularly check to see if handrails are loose or broken. Make sure that both sides of any steps have handrails. Any raised decks and porches should have guardrails on the edges. Have any leaves, snow, or ice cleared regularly. Use sand or salt on walking paths during winter. Clean up any spills in your garage right away. This includes oil or grease spills. What can I do in the bathroom? Use night lights. Install grab bars by the toilet and in the tub and shower. Do not use towel bars as grab bars. Use non-skid mats or decals in the tub or shower. If you need to sit down in the shower, use a  plastic, non-slip stool. Keep the floor dry. Clean up any water that spills on the floor as soon as it happens. Remove soap buildup in the tub or shower regularly. Attach bath mats securely with double-sided non-slip rug tape. Do not have throw rugs and other things on the floor that can make you trip. What can I do in the bedroom? Use night lights. Make sure that you have a light by your bed that is easy to reach. Do not use any sheets or blankets that are too big for your bed. They should not hang down onto the floor. Have a firm chair that has side arms. You can use this for support while you get dressed. Do not have throw rugs and other things on the floor that can make you trip. What can I do in the kitchen? Clean up any spills right away. Avoid walking on wet floors. Keep items that you use a lot in easy-to-reach places. If you need to reach something above you, use a strong step stool that has a grab bar. Keep electrical cords out of the way. Do not use floor polish or wax that makes floors slippery. If you must use wax, use non-skid floor wax. Do not have throw rugs and other things on the floor that can make you trip. What can I do with my stairs? Do not leave any items on the stairs. Make sure that  there are handrails on both sides of the stairs and use them. Fix handrails that are broken or loose. Make sure that handrails are as long as the stairways. Check any carpeting to make sure that it is firmly attached to the stairs. Fix any carpet that is loose or worn. Avoid having throw rugs at the top or bottom of the stairs. If you do have throw rugs, attach them to the floor with carpet tape. Make sure that you have a light switch at the top of the stairs and the bottom of the stairs. If you do not have them, ask someone to add them for you. What else can I do to help prevent falls? Wear shoes that: Do not have high heels. Have rubber bottoms. Are comfortable and fit you  well. Are closed at the toe. Do not wear sandals. If you use a stepladder: Make sure that it is fully opened. Do not climb a closed stepladder. Make sure that both sides of the stepladder are locked into place. Ask someone to hold it for you, if possible. Clearly mark and make sure that you can see: Any grab bars or handrails. First and last steps. Where the edge of each step is. Use tools that help you move around (mobility aids) if they are needed. These include: Canes. Walkers. Scooters. Crutches. Turn on the lights when you go into a dark area. Replace any light bulbs as soon as they burn out. Set up your furniture so you have a clear path. Avoid moving your furniture around. If any of your floors are uneven, fix them. If there are any pets around you, be aware of where they are. Review your medicines with your doctor. Some medicines can make you feel dizzy. This can increase your chance of falling. Ask your doctor what other things that you can do to help prevent falls. This information is not intended to replace advice given to you by your health care provider. Make sure you discuss any questions you have with your health care provider. Document Released: 02/04/2009 Document Revised: 09/16/2015 Document Reviewed: 05/15/2014 Elsevier Interactive Patient Education  2017 [employer].

## 2021-01-13 NOTE — Progress Notes (Signed)
Subjective:   Kenneth Cline is a 65 y.o. male who presents for an Initial Medicare Annual Wellness Visit. Virtual Visit via Telephone Note  I connected with  Kenneth Cline on 01/13/21 at  9:00 AM EDT by telephone and verified that I am speaking with the correct person using two identifiers.  Location: Patient: HOME Provider: [facility] Persons participating in the virtual visit: patient/Nurse Health Advisor   I discussed the limitations, risks, security and privacy concerns of performing an evaluation and management service by telephone and the availability of in person appointments. The patient expressed understanding and agreed to proceed.  Interactive audio and video telecommunications were attempted between this nurse and patient, however failed, due to patient having technical difficulties OR patient did not have access to video capability.  We continued and completed visit with audio only.  Some vital signs may be absent or patient reported.   Kenneth Driver, LPN  Review of Systems     Cardiac Risk Factors include: advanced age (>78mn, >>56women);hypertension;diabetes mellitus;male gender;dyslipidemia;sedentary lifestyle     Objective:    Today's Vitals   01/13/21 0903  Weight: 185 lb (83.9 kg)  Height: 5' 8"  (1.727 m)   Body mass index is 28.13 kg/m.  Advanced Directives 01/13/2021 08/10/2014  Does Patient Have a Medical Advance Directive? Yes No  Type of AParamedicof [place] will -  Copy of [place] Chart? No - copy requested -    Current Medications (verified) Outpatient Encounter Medications as of 01/13/2021  Medication Sig   aspirin EC 81 MG tablet Take 81 mg by mouth as directed.   Calcium Carb-Cholecalciferol (CALCIUM + VITAMIN D3 PO) Take by mouth as directed.   clonazePAM (KLONOPIN) 0.5 MG tablet Take 0.5 mg by mouth 2 (two) times daily as needed for anxiety.   ezetimibe (ZETIA) 10 MG tablet TAKE  ONE TABLET BY MOUTH EVERY DAY   ferrous sulfate 325 (65 FE) MG tablet Take 325 mg by mouth daily with breakfast.   fludrocortisone (FLORINEF) 0.1 MG tablet Take 0.1 mg by mouth daily.   gabapentin (NEURONTIN) 300 MG capsule TAKE 1 CAPSULE BY MOUTH 3 TIMES A DAY MAY INCREASE TO UP TO 6 CAPSULES A DAY   ipratropium (ATROVENT) 0.06 % nasal spray 1-2 SPRAYS IN NOSTRILS 3 (THREE) TIMES DAILY AS NEEDED FOR RHINITIS (RUNNY NOSE/POST NASAL DRIP).   levothyroxine (SYNTHROID, LEVOTHROID) 75 MCG tablet Take 37.5 mcg by mouth daily.   magnesium oxide (MAG-OX) 400 MG tablet Take 400 mg by mouth daily.   metFORMIN (GLUCOPHAGE-XR) 500 MG 24 hr tablet Take 1 tablet by mouth 2 (two) times daily.   metoprolol succinate (TOPROL XL) 50 MG 24 hr tablet Take 1 tablet (50 mg total) by mouth daily. Take with or immediately following a meal.   mometasone (NASONEX) 50 MCG/ACT nasal spray Place 2 sprays into the nose daily.   pantoprazole (PROTONIX) 40 MG tablet Take 1 tablet (40 mg total) by mouth 2 (two) times daily.   predniSONE (DELTASONE) 1 MG tablet Take by mouth. Take 892mPO Q AM and 2.43m54mO Q PM   Probiotic Product (PROBIOTIC DAILY PO) Take by mouth.   sertraline (ZOLOFT) 50 MG tablet Take 50 mg by mouth daily.   traMADol (ULTRAM) 50 MG tablet Take by mouth every 6 (six) hours as needed.   ursodiol (ACTIGALL) 500 MG tablet Take 500 mg by mouth 2 (two) times daily.   warfarin (COUMADIN) 5  MG tablet TAKE 1 TO 1.5 TABLETS BY MOUTH DAILY AS DIRECTED BY COUMADIN CLINIC   dapagliflozin propanediol (FARXIGA) 10 MG TABS tablet Take 1 tablet (10 mg total) by mouth daily before breakfast. (Patient not taking: Reported on 01/13/2021)   doxycycline (VIBRA-TABS) 100 MG tablet Take 1 tablet (100 mg total) by mouth 2 (two) times daily. (Patient not taking: Reported on 01/13/2021)   No facility-administered encounter medications on file as of 01/13/2021.    Allergies (verified) Biaxin [clarithromycin], Keflex [cephalexin],  Macrolides and ketolides, Erythromycin, and Penicillins   History: Past Medical History:  Diagnosis Date   Adrenal hemorrhage [facility]) April 2016   Following shoulder surgery   Adrenal insufficiency, primary, hemorrhagic ([place]) 07/2014   due to bilateral adrenal hemorrhage   Autoimmune hepatitis ([place])    [place]   Cellulitis 09/2018   septic shock, compartment syndrome s/p fasciotomy RLE at 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  One sister with valve disease.   GERD (gastroesophageal reflux disease)    H/O acute cholangitis May 8250   Complicated by septic shock, acute pancreatitis and acute choledocholithiasis;    Lupus anticoagulant with hemorrhagic disorder (HCC)    Previously on Xarelto. Now on warfarin   Overweight (BMI 25.0-29.9)    PSC (primary sclerosing cholangitis)    managed by Dr. 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 Likely cholangitis   ERCP W/ PLASTIC STENT PLACEMENT  Sep 09 2014   [facility] - Dr. Alvera Novel   ERCP W/ SPHINCTEROTOMY AND BALLOON DILATION  11/10/2014   [facility] -- Removal of biliary stent,, to PACU duodenitis   LAPAROSCOPIC CHOLECYSTECTOMY W/ CHOLANGIOGRAPHY  November 13 2014   [facility], Dr. Warnell Bureau [place]   NM MYOCAR PERF WALL MOTION  06/28/2006   EF 53% , LV systolic fx norm.   ROTATOR CUFF REPAIR     TRANSTHORACIC ECHOCARDIOGRAM  04/2015   No pericardial effusion. No shunt. Full study: Basal septal LVH. Pseudo-normal filling (GR 2 DD). EF 57%.   TRANSTHORACIC ECHOCARDIOGRAM  08/2015   [facility]): Normal LV size. Basal septal hypertrophy. EF 57%. Pseudo-normal filling. Mild focal aortic valve thickening. (Study was done to evaluate for possible endocarditis)   Family History  Problem Relation Age  of Onset   Heart attack Father 55       Diet cardiac arrest   Sudden death Father 61   Heart disease Sister 54       triscuspid valve insuff,cardioversion   Hypertension Sister        She is currently in her 23s as well   Hypertension Brother    Heart disease Brother 5       CABG; now age 32   Ovarian cancer Maternal Grandmother    Heart attack Brother 40       Died from cardiac arrest   Hypertension Brother    Hypertension Sister    Heart disease Sister 74       CABG plus valve in 22's;    Sudden death Brother    Heart failure Sister        Died from complications of CHF following bowel surgery   Social History   Socioeconomic History   Marital status: Married    Spouse name: Not on file   Number of children: 2   Years of education: Not on file   Highest education level: Not on file  Occupational History    Employer: [employer]  Tobacco Use   Smoking status: Former    Packs/day: 0.50    Types: Cigarettes   Smokeless tobacco: Never  Substance and Sexual Activity   Alcohol use: Not  Currently    Alcohol/week: 3.0 standard drinks    Types: 3 Standard drinks or equivalent per week    Comment: occassional   Drug use: No   Sexual activity: Not Currently    Birth control/protection: Surgical, None  Other Topics Concern   Not on file  Social History Narrative   He is a married father of 2 with 3 stepchildren.  He has 3 grandchildren his own and 3 step grandchildren.  He works as a Dealer for [employer].  He lives with his wife, [name], of 8 years.  He does not, and never did smoke.  He takes occasional alcohol beverage but nothing significant.  He does not do routine exercise, but does walk a lot at work.   Social Determinants of Health   Financial Resource Strain: Low Risk    Difficulty of Paying Living Expenses: Not hard at all  Food Insecurity: No Food Insecurity   Worried About Charity fundraiser in the Last Year: Never true   [place] in the Last Year: Never true  Transportation Needs: No Transportation Needs   Lack of Transportation (Medical): No   Lack of Transportation (Non-Medical): No  Physical Activity: Sufficiently Active   Days of Exercise per Week: 5 days   Minutes of Exercise per Session: 60 min  Stress: No Stress Concern Present   Feeling of Stress : Not at all  Social Connections: Moderately Isolated   Frequency of Communication with Friends and Family: More than three times a week   Frequency of Social Gatherings with Friends and Family: More than three times a week   Attends Religious Services: Never   Marine scientist or Organizations: No   Attends Music therapist: Never   Marital Status: Married    Tobacco Counseling Counseling given: Not Answered   Clinical Intake:  Pre-visit preparation completed: Yes  Pain : No/denies pain     BMI - recorded: 28.13 Nutritional Status: BMI 25 -29 Overweight Nutritional Risks: None Diabetes: Yes  How often do you need to have someone help you when  you read instructions,  pamphlets, or other written materials from your doctor or pharmacy?: 1 - Never  Diabetic?Nutrition Risk Assessment:  Has the patient had any N/V/D within the last 2 months?  No  Does the patient have any non-healing wounds?  No  Has the patient had any unintentional weight loss or weight gain?  No   Diabetes:  Is the patient diabetic?  Yes  If diabetic, was a CBG obtained today?  No  Did the patient bring in their glucometer from home?  No  How often do you monitor your CBG's? As needed.   Financial Strains and Diabetes Management:  Are you having any financial strains with the device, your supplies or your medication? No .  Does the patient want to be seen by Chronic Care Management for management of their diabetes?  No  Would the patient like to be referred to a Nutritionist or for Diabetic Management?  No   Diabetic Exams:  Diabetic Eye Exam: Completed 04/21/20. Pt has been advised about the importance in completing this exam.  Diabetic Foot Exam: Completed NOT DONE. Pt has been advised about the importance in completing this exam.    Interpreter Needed?: No  Information entered by :: Randal Buba, LPN   Activities of Daily Living In your present state of health, do you have any difficulty performing the following activities: 01/13/2021 01/05/2021  Hearing? N N  Vision? N N  Difficulty concentrating or making decisions? N N  Walking or climbing stairs? N N  Dressing or bathing? N N  Doing errands, shopping? N N  Preparing Food and eating ? N -  Using the Toilet? N -  In the past six months, have you accidently leaked urine? N -  Do you have problems with loss of bowel control? N -  Managing your Medications? N -  Managing your Finances? N -  Housekeeping or managing your Housekeeping? N -  Some recent data might be hidden    Patient Care Team: Susy Frizzle, MD as PCP - General (Family Medicine) Leonie Man, MD as PCP - Cardiology  (Cardiology)  Indicate any recent Medical Services you may have received from other than [facility] providers in the past year (date may be approximate).     Assessment:   This is a routine wellness examination for Kenneth Cline.  Hearing/Vision screen Hearing Screening - Comments:: No hearing issues. Vision Screening - Comments:: Glasses. Dr. Katy Fitch. Up to date.  Dietary issues and exercise activities discussed: Current Exercise Habits: The patient has a physically strenuous job, but has no regular exercise apart from work., Exercise limited by: cardiac condition(s);Other - see comments (CVA, Renal Vein Thrombosis.)   Goals Addressed             This Visit's Progress    Exercise 3x per week (30 min per time)       Pt currently works at job where he is lifting and walking. Encouraged pt to try and get in some walking outside of work when he can.       Depression Screen PHQ 2/9 Scores 01/13/2021 01/05/2021 04/30/2017 03/12/2017  PHQ - 2 Score 0 0 1 0  PHQ- 9 Score - - 6 -    Fall Risk Fall Risk  01/13/2021 01/05/2021  Falls in the past year? 0 0  Number falls in past yr: 0 0  Injury with Fall? 0 0  Risk for fall due to : No Fall Risks -  Follow up  Falls prevention discussed -    FALL RISK PREVENTION PERTAINING TO THE HOME:  Any stairs in or around the home? Yes  If so, are there any without handrails? No  Home free of loose throw rugs in walkways, pet beds, electrical cords, etc? Yes  Adequate lighting in your home to reduce risk of falls? Yes   ASSISTIVE DEVICES UTILIZED TO PREVENT FALLS:  Life alert? No  Use of a cane, walker or w/c? No  Grab bars in the bathroom? No  Shower chair or bench in shower? No  Elevated toilet seat or a handicapped toilet? Yes   TIMED UP AND GO:  Was the test performed? No . PHONE VISIT  Cognitive Function:     6CIT Screen 01/13/2021  What Year? 0 points  What month? 0 points  What time? 0 points  Count back from 20 0 points  Months in  reverse 0 points  Repeat phrase 2 points  Total Score 2    Immunizations Immunization History  Administered Date(s) Administered   DTP / HiB 01/01/2013   Influenza Inj Mdck Quad Pf 12/31/2017, 12/31/2017   Influenza Whole 01/01/2013   Influenza, Quadrivalent, Recombinant, Inj, Pf 12/28/2014   Influenza,inj,Quad PF,6+ Mos 01/10/2017, 12/25/2018   Influenza,inj,quad, With Preservative 12/28/2014   Influenza-Unspecified 01/01/2013, 12/28/2014, 01/10/2017, 12/31/2017   PFIZER(Purple Top)SARS-COV-2 Vaccination 06/08/2019, 07/02/2019, 02/23/2020   PPD Test 09/02/2015   Pneumococcal Conjugate-13 03/17/2017, 04/05/2017   Td 01/29/2020   Zoster Recombinat (Shingrix) 03/17/2017, 07/16/2017    TDAP status: Up to date  Flu Vaccine status: Due, Education has been provided regarding the importance of this vaccine. Advised may receive this vaccine at local pharmacy or Health Dept. Aware to provide a copy of the vaccination record if obtained from local pharmacy or Health Dept. Verbalized acceptance and understanding.  Pneumococcal vaccine status: Up to date  Covid-19 vaccine status: Completed vaccines  Qualifies for Shingles Vaccine? Yes   Zostavax completed Yes   Shingrix Completed?: Yes  Screening Tests Health Maintenance  Topic Date Due   URINE MICROALBUMIN  Never done   COVID-19 Vaccine (4 - Booster for Pfizer series) 05/17/2020   INFLUENZA VACCINE  11/22/2020   COLONOSCOPY (Pts 45-43yr Insurance coverage will need to be confirmed)  10/25/2026   TETANUS/TDAP  01/28/2030   Hepatitis C Screening  Completed   HIV Screening  Completed   Zoster Vaccines- Shingrix  Completed   HPV VACCINES  Aged Out    Health Maintenance  Health Maintenance Due  Topic Date Due   URINE MICROALBUMIN  Never done   COVID-19 Vaccine (4 - Booster for Pfizer series) 05/17/2020   INFLUENZA VACCINE  11/22/2020    Colorectal cancer screening: Type of screening: Colonoscopy. Completed 03/03/2020. Repeat  every 2 years  Lung Cancer Screening: (Low Dose CT Chest recommended if Age 65-80years, 30 pack-year currently smoking OR have quit w/in 15years.) does not qualify.    Additional Screening:  Hepatitis C Screening: does qualify; Completed 08/16/2014  Vision Screening: Recommended annual ophthalmology exams for early detection of glaucoma and other disorders of the eye. Is the patient up to date with their annual eye exam?  Yes  Who is the provider or what is the name of the office in which the patient attends annual eye exams? 04/21/2020 If pt is not established with a provider, would they like to be referred to a provider to establish care? No .   Dental Screening: Recommended annual dental exams for proper oral hygiene  [employer]  Referral / Chronic Care Management: CRR required this visit?  No   CCM required this visit?  No      Plan:     I have personally reviewed and noted the following in the patient's chart:   Medical and social history Use of alcohol, tobacco or illicit drugs  Current medications and supplements including opioid prescriptions. Patient is not currently taking opioid prescriptions. Functional ability and status Nutritional status Physical activity Advanced directives List of other physicians Hospitalizations, surgeries, and ER visits in previous 12 months Vitals Screenings to include cognitive, depression, and falls Referrals and appointments  In addition, I have reviewed and discussed with patient certain preventive protocols, quality metrics, and best practice recommendations. A written personalized care plan for preventive services as well as general preventive health recommendations were provided to patient.     Kenneth Driver, LPN   5/93/0123   Nurse Notes: Pt states he is doing well. Pt is up to date on all vaccines other than flu. Pt advised that vaccine is available in our office or at local pharmacy. Pt is up to date on all  health maintenance. Pt states he does colonoscopy every other year with Dr. Tiana Loft at [place] exam was 03/03/2020. Pt does need diabetic foot exam. Pt offered CCM services but declines today.

## 2021-01-19 ENCOUNTER — Other Ambulatory Visit: Payer: Self-pay

## 2021-01-19 ENCOUNTER — Ambulatory Visit (INDEPENDENT_AMBULATORY_CARE_PROVIDER_SITE_OTHER): Payer: Medicare Other

## 2021-01-19 DIAGNOSIS — Z5181 Encounter for therapeutic drug level monitoring: Secondary | ICD-10-CM

## 2021-01-19 DIAGNOSIS — I823 Embolism and thrombosis of renal vein: Secondary | ICD-10-CM | POA: Diagnosis not present

## 2021-01-19 DIAGNOSIS — Z7901 Long term (current) use of anticoagulants: Secondary | ICD-10-CM

## 2021-01-19 LAB — POCT INR: INR: 3.2 — AB (ref 2.0–3.0)

## 2021-01-19 NOTE — Patient Instructions (Signed)
continue 1 tablet Daily except 1.5 on Monday and Friday.. Eat greens tonight.  Repeat in 4 weeks

## 2021-01-24 ENCOUNTER — Ambulatory Visit (INDEPENDENT_AMBULATORY_CARE_PROVIDER_SITE_OTHER): Payer: Medicare Other | Admitting: Family Medicine

## 2021-01-24 ENCOUNTER — Encounter: Payer: Self-pay | Admitting: Family Medicine

## 2021-01-24 ENCOUNTER — Other Ambulatory Visit: Payer: Self-pay

## 2021-01-24 VITALS — BP 128/68 | HR 88 | Temp 97.9°F | Resp 16 | Ht 68.0 in | Wt 190.0 lb

## 2021-01-24 DIAGNOSIS — M71122 Other infective bursitis, left elbow: Secondary | ICD-10-CM

## 2021-01-24 MED ORDER — DOXYCYCLINE HYCLATE 100 MG PO TABS: 100.0000 mg | ORAL_TABLET | Freq: Two times a day (BID) | ORAL | 0 refills | Status: DC

## 2021-01-24 NOTE — Addendum Note (Signed)
Addended by: Sheral Flow on: 01/24/2021 10:41 AM   Modules accepted: Orders

## 2021-01-24 NOTE — Progress Notes (Signed)
Subjective:    Patient ID: Kenneth Cline, male    DOB: 1956-03-17, 65 y.o.   MRN: [id number]  HPI Patient was recently treated with doxycycline for cellulitis over the left elbow.  He states that the redness improved while he was taking the doxycycline however it came back over the last 48 hours.  His left elbow is erythematous warm and swollen.  The olecranon bursa is tender to palpation is obviously inflamed and is erythematous.  My concern is for septic bursitis of the olecranon bursa in an immunocompromise patient Past Medical History:  Diagnosis Date   Adrenal hemorrhage [facility]) April 2016   Following shoulder surgery   Adrenal insufficiency, primary, hemorrhagic ([place]) 07/2014   due to bilateral adrenal hemorrhage   Autoimmune hepatitis ([place])    [place]   Cellulitis 09/2018   septic shock, compartment syndrome s/p fasciotomy RLE at 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  One sister with valve disease.   GERD (gastroesophageal reflux disease)    H/O acute cholangitis May 2536   Complicated by septic shock, acute pancreatitis and acute choledocholithiasis;    Lupus anticoagulant with hemorrhagic disorder (HCC)    Previously on Xarelto. Now on warfarin   Overweight (BMI 25.0-29.9)    PSC (primary sclerosing cholangitis)    managed by Dr. 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 Likely cholangitis   ERCP W/ PLASTIC STENT PLACEMENT  Sep 09 2014   [facility] - Dr. Alvera Novel   ERCP W/ SPHINCTEROTOMY AND BALLOON DILATION  11/10/2014   [facility] -- Removal of biliary stent,, to PACU duodenitis   LAPAROSCOPIC CHOLECYSTECTOMY W/ CHOLANGIOGRAPHY  November 13 2014   [facility], Dr. Warnell Bureau [place]   NM MYOCAR PERF WALL MOTION  06/28/2006   EF 64% , LV systolic fx norm.   ROTATOR CUFF REPAIR     TRANSTHORACIC ECHOCARDIOGRAM  04/2015   No pericardial effusion. No shunt. Full study: Basal septal LVH. Pseudo-normal filling (GR 2 DD). EF 57%.   TRANSTHORACIC ECHOCARDIOGRAM  08/2015   [facility]): Normal LV size. Basal septal hypertrophy. EF 57%. Pseudo-normal filling. Mild focal aortic valve thickening. (Study was done to evaluate for possible endocarditis)   Current Outpatient Medications on File Prior  to Visit  Medication Sig Dispense Refill   aspirin EC 81 MG tablet Take 81 mg by mouth as directed.     Calcium Carb-Cholecalciferol (CALCIUM + VITAMIN D3 PO) Take by mouth as directed.     clonazePAM (KLONOPIN) 0.5 MG tablet Take 0.5 mg by mouth 2 (two) times daily as needed for anxiety.     ezetimibe (ZETIA) 10 MG tablet TAKE ONE TABLET BY MOUTH EVERY DAY 90 tablet 1   ferrous sulfate 325 (65 FE) MG tablet Take 325 mg by mouth daily with breakfast.     fludrocortisone (FLORINEF) 0.1 MG tablet Take 0.1 mg by mouth daily.     gabapentin (NEURONTIN)  300 MG capsule TAKE 1 CAPSULE BY MOUTH 3 TIMES A DAY MAY INCREASE TO UP TO 6 CAPSULES A DAY 180 capsule 4   ipratropium (ATROVENT) 0.06 % nasal spray 1-2 SPRAYS IN NOSTRILS 3 (THREE) TIMES DAILY AS NEEDED FOR RHINITIS (RUNNY NOSE/POST NASAL DRIP). 45 mL 0   levothyroxine (SYNTHROID, LEVOTHROID) 75 MCG tablet Take 37.5 mcg by mouth daily.     magnesium oxide (MAG-OX) 400 MG tablet Take 400 mg by mouth daily.     metFORMIN (GLUCOPHAGE-XR) 500 MG 24 hr tablet Take 1 tablet by mouth 2 (two) times daily.     metoprolol succinate (TOPROL XL) 50 MG 24 hr tablet Take 1 tablet (50 mg total) by mouth daily. Take with or immediately following a meal. 90 tablet 3   mometasone (NASONEX) 50 MCG/ACT nasal spray Place 2 sprays into the nose daily.     pantoprazole (PROTONIX) 40 MG tablet Take 1 tablet (40 mg total) by mouth 2 (two) times daily. 180 tablet 1   predniSONE (DELTASONE) 1 MG tablet Take by mouth. Take 98m PO Q AM and 2.558mPO Q PM     Probiotic Product (PROBIOTIC DAILY PO) Take by mouth.     sertraline (ZOLOFT) 50 MG tablet Take 50 mg by mouth daily.     traMADol (ULTRAM) 50 MG tablet Take by mouth every 6 (six) hours as needed.     ursodiol (ACTIGALL) 500 MG tablet Take 500 mg by mouth 2 (two) times daily.  5   warfarin (COUMADIN) 5 MG tablet TAKE 1 TO 1.5 TABLETS BY MOUTH DAILY AS DIRECTED BY COUMADIN CLINIC 135 tablet 1   No current facility-administered medications on file prior to visit.   Allergies  Allergen Reactions   Biaxin [Clarithromycin] Rash    SJS   Keflex [Cephalexin] Hives, Itching and Rash   Macrolides And Ketolides Other (See Comments)    Unknown   Erythromycin     Was told not to take d/t medical problems   Penicillins     REACTION: rash   Social History   Socioeconomic History   Marital status: Married    Spouse name: Not on file   Number of children: 2   Years of education: Not on file   Highest education level: Not on file  Occupational History    Employer: [employer]   Tobacco Use   Smoking status: Former    Packs/day: 0.50    Types: Cigarettes   Smokeless tobacco: Never  Substance and Sexual Activity   Alcohol use: Not Currently    Alcohol/week: 3.0 standard drinks    Types: 3 Standard drinks or equivalent per week    Comment: occassional   Drug use: No   Sexual activity: Not Currently    Birth control/protection: Surgical, None  Other Topics Concern  Not on file  Social History Narrative   He is a married father of 2 with 3 stepchildren.  He has 3 grandchildren his own and 3 step grandchildren.  He works as a Dealer for [employer].  He lives with his wife, [name], of 8 years.  He does not, and never did smoke.  He takes occasional alcohol beverage but nothing significant.  He does not do routine exercise, but does walk a lot at work.   Social Determinants of Health   Financial Resource Strain: Low Risk    Difficulty of Paying Living Expenses: Not hard at all  Food Insecurity: No Food Insecurity   Worried About Charity fundraiser in the Last Year: Never true   [place] in the Last Year: Never true  Transportation Needs: No Transportation Needs   Lack of Transportation (Medical): No   Lack of Transportation (Non-Medical): No  Physical Activity: Sufficiently Active   Days of Exercise per Week: 5 days   Minutes of Exercise per Session: 60 min  Stress: No Stress Concern Present   Feeling of Stress : Not at all  Social Connections: Moderately Isolated   Frequency of Communication with Friends and Family: More than three times a week   Frequency of Social Gatherings with Friends and Family: More than three times a week   Attends Religious Services: Never   Marine scientist or Organizations: No   Attends Music therapist: Never   Marital Status: Married  Human resources officer Violence: Not At Risk   Fear of Current or Ex-Partner: No   Emotionally Abused: No   Physically Abused: No   Sexually Abused: No     Review of  Systems     Objective:   Physical Exam Constitutional:      General: He is not in acute distress.    Appearance: He is not diaphoretic.  HENT:     Head: Normocephalic and atraumatic.     Right Ear: External ear normal.     Left Ear: External ear normal.     Nose: Nose normal.     Mouth/Throat:     Pharynx: No oropharyngeal exudate.  Eyes:     General: No scleral icterus.       Right eye: No discharge.        Left eye: No discharge.     Conjunctiva/sclera: Conjunctivae normal.     Pupils: Pupils are equal, round, and reactive to light.  Neck:     Thyroid: No thyromegaly.     Vascular: No JVD.     Trachea: No tracheal deviation.  Cardiovascular:     Rate and Rhythm: Rhythm irregularly irregular.     Heart sounds: No murmur heard.   No friction rub. No gallop.  Pulmonary:     Breath sounds: No stridor. No rales.  Chest:     Chest wall: No tenderness.  Abdominal:     General: Bowel sounds are normal. There is no distension.     Palpations: Abdomen is soft. There is no mass.     Tenderness: There is no abdominal tenderness. There is no guarding or rebound.  Musculoskeletal:     Left elbow: Swelling and deformity present.     Cervical back: Normal range of motion and neck supple.  Lymphadenopathy:     Cervical: No cervical adenopathy.  Skin:    General: Skin is warm.     Coloration: Skin is not pale.     Findings:  No erythema or rash.  Neurological:     Mental Status: He is alert and oriented to person, place, and time.     Cranial Nerves: No cranial nerve deficit.     Motor: No abnormal muscle tone.     Coordination: Coordination normal.     Deep Tendon Reflexes: Reflexes are normal and symmetric. Reflexes normal.  Psychiatric:        Behavior: Behavior normal.        Thought Content: Thought content normal.        Judgment: Judgment normal.         Assessment & Plan:  Septic olecranon bursitis of left elbow - Plan: CULTURE, ROUTINE-ABSCESS, Gram stain, Cell  count + diff,  w/ cryst-synvl fld, Ambulatory referral to Orthopedic Surgery Start the patient on doxycycline 100 mg p.o. twice daily for at least the next 2 weeks for possible septic bursitis of the olecranon bursa.  He is on Coumadin so therefore we will need to recheck an INR in 4 days and adjust his Coumadin based on his INR results.  I plan to repeat that weekly while he is on doxycycline.  I prepped the left elbow in sterile fashion with Betadine.  The skin was anesthetized with 0.1% lidocaine with epinephrine.  An 18-gauge needle was then used to aspirate approximately 5 cc of opaque yellow material.  This will be sent for cell count, differential, culture, and gram stain.  If culture confirms septic bursitis, he will need orthopedic consultation as soon as possible.  I feel highly that this is septic bursitis

## 2021-01-28 ENCOUNTER — Other Ambulatory Visit: Payer: Medicare Other

## 2021-01-28 ENCOUNTER — Other Ambulatory Visit: Payer: Self-pay

## 2021-01-28 ENCOUNTER — Ambulatory Visit (INDEPENDENT_AMBULATORY_CARE_PROVIDER_SITE_OTHER): Payer: Medicare Other

## 2021-01-28 DIAGNOSIS — Z5181 Encounter for therapeutic drug level monitoring: Secondary | ICD-10-CM | POA: Diagnosis not present

## 2021-01-28 DIAGNOSIS — Z7901 Long term (current) use of anticoagulants: Secondary | ICD-10-CM

## 2021-01-28 DIAGNOSIS — M7030 Other bursitis of elbow, unspecified elbow: Secondary | ICD-10-CM | POA: Insufficient documentation

## 2021-01-28 DIAGNOSIS — I823 Embolism and thrombosis of renal vein: Secondary | ICD-10-CM

## 2021-01-28 LAB — POCT INR: INR: 3.3 — AB (ref 2.0–3.0)

## 2021-01-28 NOTE — Patient Instructions (Signed)
Hold tonight only and then continue 1 tablet Daily except 1.5 on Monday and Friday.. Eat greens.  Repeat in 3 weeks

## 2021-01-30 ENCOUNTER — Other Ambulatory Visit: Payer: Self-pay | Admitting: Cardiology

## 2021-01-30 LAB — ANAEROBIC AND AEROBIC CULTURE
AER RESULT:: NO GROWTH
MICRO NUMBER:: 12452005
MICRO NUMBER:: 12452006
SPECIMEN QUALITY:: ADEQUATE
SPECIMEN QUALITY:: ADEQUATE

## 2021-01-30 LAB — CELL COUNT + DIFF, W/O CRYST-SYNVL FLD
Basophils, %: 1 % — ABNORMAL HIGH
Eosinophils-Synovial: 0 % (ref 0–2)
Lymphocytes-Synovial Fld: 2 % (ref 0–74)
Monocyte/Macrophage: 10 % (ref 0–69)
Neutrophil, Synovial: 87 % — ABNORMAL HIGH (ref 0–24)
Synoviocytes, %: 0 % (ref 0–15)
WBC, Synovial: 3909 cells/uL — ABNORMAL HIGH (ref <150)

## 2021-02-11 ENCOUNTER — Encounter: Payer: Self-pay | Admitting: Cardiology

## 2021-02-11 ENCOUNTER — Telehealth (INDEPENDENT_AMBULATORY_CARE_PROVIDER_SITE_OTHER): Payer: Medicare Other | Admitting: Cardiology

## 2021-02-11 ENCOUNTER — Ambulatory Visit (INDEPENDENT_AMBULATORY_CARE_PROVIDER_SITE_OTHER): Payer: Medicare Other | Admitting: Pharmacist

## 2021-02-11 ENCOUNTER — Telehealth: Payer: Self-pay | Admitting: *Deleted

## 2021-02-11 ENCOUNTER — Other Ambulatory Visit: Payer: Self-pay

## 2021-02-11 VITALS — BP 115/76 | Ht 68.0 in | Wt 185.0 lb

## 2021-02-11 DIAGNOSIS — I823 Embolism and thrombosis of renal vein: Secondary | ICD-10-CM

## 2021-02-11 DIAGNOSIS — Z7901 Long term (current) use of anticoagulants: Secondary | ICD-10-CM

## 2021-02-11 DIAGNOSIS — I10 Essential (primary) hypertension: Secondary | ICD-10-CM | POA: Diagnosis not present

## 2021-02-11 DIAGNOSIS — I491 Atrial premature depolarization: Secondary | ICD-10-CM

## 2021-02-11 DIAGNOSIS — E785 Hyperlipidemia, unspecified: Secondary | ICD-10-CM

## 2021-02-11 DIAGNOSIS — Z8249 Family history of ischemic heart disease and other diseases of the circulatory system: Secondary | ICD-10-CM

## 2021-02-11 LAB — POCT INR: INR: 2.9 (ref 2.0–3.0)

## 2021-02-11 NOTE — Patient Instructions (Addendum)
Medication Instructions:  Keep going with the same dose of Toprol.  It is okay to hold a dose if your blood pressure is low (below 045 mmHg systolic) or if you are not feeling well.  Also may want to consider taking the Toprol dose in the evening at dinnertime as opposed to the morning.  This way if you are not feeling well when you wake up you can just wait to see how you feel in the evening for taking medicine.  *If you need a refill on your cardiac medications before your next appointment, please call your pharmacy*   Lab Work: Should have lipid panel and liver panel checked prior to follow-up visit in May June timeframe if not already checked by PCP.  Does not when you schedule your visit, if you have not had your blood work checked for cholesterol within 6 months go ahead and let us know and we can order it so that can be checked.  If you have labs (blood work) drawn today and your tests are completely normal, you will receive your results only by: [place] (if you have MyChart) OR A paper copy in the mail If you have any lab test that is abnormal or we need to change your treatment, we will call you to review the results.   Testing/Procedures: Next visit we can talk about test like a coronary calcium score.   Follow-Up: At [facility], you and your health needs are our priority.  As part of our continuing mission to provide you with exceptional heart care, we have created designated Provider Care Teams.  These Care Teams include your primary Cardiologist (physician) and Advanced Practice Providers (APPs -  Physician Assistants and Nurse Practitioners) who all work together to provide you with the care you need, when you need it.  We recommend signing up for the patient portal called "MyChart".  Sign up information is provided on this After Visit Summary.  MyChart is used to connect with patients for Virtual Visits (Telemedicine).  Patients are able to view lab/test results,  encounter notes, upcoming appointments, etc.  Non-urgent messages can be sent to your provider as well.   To learn more about what you can do with MyChart, go to [email or web address].    Your next appointment:   8 month(s)  ( June 2023)   The format for your next appointment:   In Person  Provider:   Glenetta Hew, MD   Other Instructions No new changes

## 2021-02-11 NOTE — Patient Instructions (Signed)
Description   Continue 1 tablet Daily except 1.5 on Monday and Friday.  Repeat in 4 weeks

## 2021-02-11 NOTE — Progress Notes (Signed)
Virtual Visit via Telephone Note   This visit type was conducted due to national recommendations for restrictions regarding the COVID-19 Pandemic (e.g. social distancing) in an effort to limit this patient's exposure and mitigate transmission in our community.  Due to his co-morbid illnesses, this patient is at least at moderate risk for complications without adequate follow up.  This format is felt to be most appropriate for this patient at this time.  The patient did not have access to video technology/had technical difficulties with video requiring transitioning to audio format only (telephone).  All issues noted in this document were discussed and addressed.  No physical exam could be performed with this format.  Please refer to the patient's chart for his  consent to telehealth for [facility].   Patient has given verbal permission to conduct this visit via virtual appointment and to bill insurance 02/20/2021 2:55 PM     Evaluation Performed:  Follow-up visit  Date:  02/20/2021   ID:  Kenneth Cline, Kenneth Cline 05/29/1955, MRN [id number]  Patient Location: Home Provider Location: Office/Clinic  PCP:  Susy Frizzle, MD  Cardiologist:  Glenetta Hew, MD  Electrophysiologist:  None   Chief Complaint:   Chief Complaint  Patient presents with   Follow-up    PVCs-Zio patch monitor results.  Toprol dose increased. ->  Notes PACs much improved.  Only rare low blood pressures.    ====================================  ASSESSMENT & PLAN:    Problem List Items Addressed This Visit       Cardiology Problems   Hypertension, essential, benign (Chronic)    Blood pressure is doing better.  He is not having hypotension issues very frequently.  Advised adequate hydration  Continue to adequately hydrate to avoid orthostasis.  If not feeling, and not eating/drinking well-> recommend backing off on dose of Toprol to 1/2 tablet.      PAC (premature atrial contraction) - Primary     Symptoms notably improved with increased dose of beta-blocker. Does not want a blood pressure room to titrate anymore.  Significant PAC burden, but not overly symptomatic at this point.  Plan:Keep going with the same dose of Toprol.  It is okay to hold a dose if your blood pressure is low (below 767 mmHg systolic) or if you are not feeling well.  Also may want to consider taking the Toprol dose in the evening at dinnertime as opposed to the morning.  This way if you are not feeling well when you wake up you can just wait to see how you feel in the evening for taking medicine.        Renal vein thrombosis (HCC) (Chronic)    Remains on lifelong warfarin.  With the present coagulant and prior clot, would recommend bridging with Lovenox for procedures.        Other   Dyslipidemia (Chronic)    Inadequate control.  Ezetimibe does not seem to be doing much.  Due for labs recheck soon. If remain elevated, I think we can consider coronary calcium score evaluation at follow-up visit in order to risk stratify       Family history of premature coronary artery disease (Chronic)    Significant family history of CAD, thankfully he is not having any active symptoms.  Continue beta-blocker. Need to figure out how to treat lipids.  Await for follow-up labs and then likely recommend consideration of PCSK9 inhibitor.      Long term (current) use of anticoagulants (Chronic)    Remains  on long-term warfarin.  Has recently graduated and history of clot.  Now on lifelong warfarin.  Would need to be bridged with Lovenox for procedures.      ====================================  History of Present Illness:    Kenneth Cline is a 65 y.o. 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  Kenneth Cline was last seen 12/02/2020 - > for evaluation of PACs, and bigeminy.  Increased dose of Toprol was used, but was concerned for possible bradycardia.  7-day Zio patch ordered.  Hospitalizations:  none   Recent - Interim CV studies:   The following studies were reviewed today: 1 Week Zio Patch Monitor: --  Increased Toprol to 50 mg daily  Predominant rhythm is sinus rhythm with a heart rate range of 53-135 bpm. Average rate 78 bpm.  They were very frequent PACs (34%) with some bigeminy and trigeminy  Total of 52 short bursts of likely PAT (paroxysmal atrial tachycardia: Longest 7 beats at an average rate 130 bpm, fastest was 4 beats at 167 bpm.  Also occasional PVCs noted-with at least 1 symptomatic episode being ventricular bigeminy.  Rare PVC triplets noted but no couplets.  No sustained arrhythmias noted. No sustained bradycardia noted  Inerval History   Kenneth Cline seems to be doing much better since increasing his Toprol dose to 50 mg.  Noted a little more fatigue after initially increasing the dose - more than the 25 mg alone, but after a month or so, got better.  BP & HR seem to be stable - only has drop in BP when he is not feeling well - or has a fever.   Cardiovascular ROS: no chest pain or dyspnea on exertion positive for - -Fatigue improved after 3 weeks of higher dose beta-blocker.  Improved palpitations negative for - edema, orthopnea, paroxysmal nocturnal dyspnea, rapid heart rate, shortness of breath, or lightheadedness, dizziness or wooziness, syncope/near syncope or TIA/amaurosis fugax   ROS:  Please see the history of present illness.     Review of Systems  Constitutional:  Positive for fever (None recently.) and malaise/fatigue (Notably improved with more time on higher dose beta-blocker.  Less frequent palpitations). Negative for weight loss.  HENT:  Negative for congestion and nosebleeds.   Respiratory:  Negative for  cough, shortness of breath and wheezing.   Cardiovascular:  Positive for palpitations (Notably improved.).  Musculoskeletal:  Positive for joint pain. Negative for back pain and myalgias.  Neurological:  Positive for dizziness (Much better controlled). Negative for focal weakness and weakness.  Psychiatric/Behavioral:  Negative for depression and memory loss. The patient is nervous/anxious.   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  One sister with valve disease.   GERD (gastroesophageal reflux disease)    H/O acute cholangitis May 9163   Complicated by septic shock, acute pancreatitis and acute choledocholithiasis;    Lupus anticoagulant with hemorrhagic disorder (HCC)    Previously on Xarelto. Now on warfarin   Overweight (BMI 25.0-29.9)    PSC (primary sclerosing cholangitis)    managed by Dr. 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 Likely cholangitis   ERCP W/ PLASTIC STENT PLACEMENT  Sep 09 2014   [facility] - Dr. Alvera Novel   ERCP W/ SPHINCTEROTOMY AND BALLOON DILATION  11/10/2014   [facility] -- Removal of biliary stent,, to PACU duodenitis   LAPAROSCOPIC CHOLECYSTECTOMY W/ CHOLANGIOGRAPHY  November 13 2014   [facility], Dr. Warnell Bureau [place]   NM MYOCAR PERF WALL MOTION  06/28/2006   EF 84% , LV systolic fx norm.   ROTATOR CUFF REPAIR     TRANSTHORACIC ECHOCARDIOGRAM  04/2015   No pericardial effusion. No shunt. Full study: Basal septal LVH. Pseudo-normal filling (GR 2 DD). EF 57%.   TRANSTHORACIC ECHOCARDIOGRAM  08/2015   [facility]): Normal LV size. Basal septal hypertrophy. EF 57%. Pseudo-normal filling. Mild focal aortic valve thickening. (Study was done to evaluate for possible endocarditis)     Current Meds  Medication Sig   aspirin EC 81 MG tablet Take 81 mg by mouth as directed.   Calcium Carb-Cholecalciferol (CALCIUM + VITAMIN D3 PO) Take by mouth as directed.   clonazePAM (KLONOPIN) 0.5 MG tablet Take 0.5 mg by mouth 2 (two) times daily as needed for anxiety.   ezetimibe (ZETIA) 10 MG tablet TAKE ONE TABLET BY MOUTH DAILY   ferrous sulfate 325 (65 FE) MG tablet Take 325 mg by mouth daily with breakfast.   fludrocortisone (FLORINEF) 0.1 MG tablet Take 0.1 mg by mouth daily.   gabapentin (NEURONTIN) 300 MG capsule TAKE 1 CAPSULE BY MOUTH 3 TIMES A DAY MAY INCREASE TO UP TO 6 CAPSULES A DAY   ipratropium (ATROVENT) 0.06 % nasal spray  1-2 SPRAYS IN NOSTRILS 3 (THREE) TIMES DAILY AS NEEDED FOR RHINITIS (RUNNY NOSE/POST NASAL DRIP).   levothyroxine (SYNTHROID) 75 MCG tablet 75 mcg. Alternate taking 75 mcg  ( whole tablet)  with 37.5 mcg ( 1/2 tablet) by mouth every other day   magnesium oxide (MAG-OX) 400 MG tablet Take 400 mg by mouth daily.   metFORMIN (GLUCOPHAGE-XR) 500 MG 24 hr tablet Take 1 tablet by mouth 2 (two)  times daily.   metoprolol succinate (TOPROL XL) 50 MG 24 hr tablet Take 1 tablet (50 mg total) by mouth daily. Take with or immediately following a meal.   mometasone (NASONEX) 50 MCG/ACT nasal spray Place 2 sprays into the nose daily as needed.   pantoprazole (PROTONIX) 40 MG tablet Take 1 tablet (40 mg total) by mouth 2 (two) times daily.   predniSONE (DELTASONE) 1 MG tablet Take by mouth. Take 63m PO Q AM and 2.565mPO Q PM   Probiotic Product (PROBIOTIC DAILY PO) Take by mouth.   sertraline (ZOLOFT) 50 MG tablet Take 50 mg by mouth daily.   traMADol (ULTRAM) 50 MG tablet Take by mouth every 6 (six) hours as needed.   ursodiol (ACTIGALL) 500 MG tablet Take 500 mg by mouth 2 (two) times daily.   warfarin (COUMADIN) 5 MG tablet TAKE 1 TO 1.5 TABLETS BY MOUTH DAILY AS DIRECTED BY COUMADIN CLINIC     Allergies:   Biaxin [clarithromycin], Keflex [cephalexin], Macrolides and ketolides, Erythromycin, and Penicillins   Social History   Tobacco Use   Smoking status: Former    Packs/day: 0.50    Types: Cigarettes   Smokeless tobacco: Never  Substance Use Topics   Alcohol use: Not Currently    Alcohol/week: 3.0 standard drinks    Types: 3 Standard drinks or equivalent per week    Comment: occassional   Drug use: No     Family Hx: The patient's family history includes Heart attack (age of onset: 3431in his brother; Heart attack (age of onset: 5222in his father; Heart disease (age of onset: 5055in his sister; Heart disease (age of onset: 5176in his brother; Heart disease (age of onset: 6014in his sister; Heart  failure in his sister; Hypertension in his brother, brother, sister, and sister; Ovarian cancer in his maternal grandmother; Sudden death in his brother; Sudden death (age of onset: 5243in his father.   Labs/Other Tests and Data Reviewed:    EKG:  No ECG reviewed.  Recent Labs: 11/19/2020: ALT 25; BUN 24; Creat 1.19; Hemoglobin 14.5; Magnesium 1.9; Platelets 178; Potassium 4.0; Sodium 136; TSH 3.82   Recent Lipid Panel Lab Results  Component Value Date   CHOL 213 (H) 03/08/2020   HDL 46 03/08/2020   LDLCALC 132 (H) 03/08/2020   TRIG 198 (H) 03/08/2020   CHOLHDL 4.6 03/08/2020     Wt Readings from Last 3 Encounters:  02/11/21 185 lb (83.9 kg)  01/24/21 190 lb (86.2 kg)  01/13/21 185 lb (83.9 kg)     Objective:    Vital Signs:  BP 115/76   Ht _0  (1.727 m)   Wt 185 lb (83.9 kg)   BMI 28.13 kg/m  115/76 mmHg; 137/74 mmHg VITAL SIGNS:  reviewed Normal normal/Pleasant mood and affect.  Nonlabored breathing.  ==========================================  COVID-19 Education: The signs and symptoms of COVID-19 were discussed with the patient and how to seek care for testing (follow up with PCP or arrange E-visit).   The importance of social distancing was discussed today.  Time:   Today, I have spent 18 minutes with the patient with telehealth technology discussing the above problems.   An additional 10 minutes spent charting (reviewing prior notes, hospital records, studies, labs etc.) Total 28 minutes   Medication Adjustments/Labs and Tests Ordered: Current medicines are reviewed at length with the patient today.  Concerns regarding medicines are outlined above.   Patient Instructions  Medication Instructions:  Keep going with  the same dose of Toprol.  It is okay to hold a dose if your blood pressure is low (below 356 mmHg systolic) or if you are not feeling well.  Also may want to consider taking the Toprol dose in the evening at dinnertime as opposed to the morning.   This way if you are not feeling well when you wake up you can just wait to see how you feel in the evening for taking medicine.  *If you need a refill on your cardiac medications before your next appointment, please call your pharmacy*   Lab Work: Should have lipid panel and liver panel checked prior to follow-up visit in May June timeframe if not already checked by PCP.  Does not when you schedule your visit, if you have not had your blood work checked for cholesterol within 6 months go ahead and let us know and we can order it so that can be checked.  If you have labs (blood work) drawn today and your tests are completely normal, you will receive your results only by: [place] (if you have MyChart) OR A paper copy in the mail If you have any lab test that is abnormal or we need to change your treatment, we will call you to review the results.   Testing/Procedures: Next visit we can talk about test like a coronary calcium score.   Follow-Up: At [facility], you and your health needs are our priority.  As part of our continuing mission to provide you with exceptional heart care, we have created designated Provider Care Teams.  These Care Teams include your primary Cardiologist (physician) and Advanced Practice Providers (APPs -  Physician Assistants and Nurse Practitioners) who all work together to provide you with the care you need, when you need it.  We recommend signing up for the patient portal called "MyChart".  Sign up information is provided on this After Visit Summary.  MyChart is used to connect with patients for Virtual Visits (Telemedicine).  Patients are able to view lab/test results, encounter notes, upcoming appointments, etc.  Non-urgent messages can be sent to your provider as well.   To learn more about what you can do with MyChart, go to [email or web address].    Your next appointment:   8 month(s)  ( June 2023)   The format for your next appointment:   In  Person  Provider:   Glenetta Hew, MD   Other Instructions No new changes   Signed, Glenetta Hew, MD  02/20/2021 2:55 PM    [place]

## 2021-02-11 NOTE — Telephone Encounter (Signed)
  Patient Consent for Virtual Visit        Kenneth Cline has provided verbal consent on 02/11/2021 for a virtual visit (video or telephone).   CONSENT FOR VIRTUAL VISIT FOR:  Kenneth Cline  By participating in this virtual visit I agree to the following:  I hereby voluntarily request, consent and authorize [facility] and its employed or contracted physicians, physician assistants, nurse practitioners or other licensed health care professionals (the Practitioner), to provide me with telemedicine health care services (the "Services") as deemed necessary by the treating Practitioner. I acknowledge and consent to receive the Services by the Practitioner via telemedicine. I understand that the telemedicine visit will involve communicating with the Practitioner through live audiovisual communication technology and the disclosure of certain medical information by electronic transmission. I acknowledge that I have been given the opportunity to request an in-person assessment or other available alternative prior to the telemedicine visit and am voluntarily participating in the telemedicine visit.  I understand that I have the right to withhold or withdraw my consent to the use of telemedicine in the course of my care at any time, without affecting my right to future care or treatment, and that the Practitioner or I may terminate the telemedicine visit at any time. I understand that I have the right to inspect all information obtained and/or recorded in the course of the telemedicine visit and may receive copies of available information for a reasonable fee.  I understand that some of the potential risks of receiving the Services via telemedicine include:  Delay or interruption in medical evaluation due to technological equipment failure or disruption; Information transmitted may not be sufficient (e.g. poor resolution of images) to allow for appropriate medical decision making by the Practitioner;  and/or  In rare instances, security protocols could fail, causing a breach of personal health information.  Furthermore, I acknowledge that it is my responsibility to provide information about my medical history, conditions and care that is complete and accurate to the best of my ability. I acknowledge that Practitioner's advice, recommendations, and/or decision may be based on factors not within their control, such as incomplete or inaccurate data provided by me or distortions of diagnostic images or specimens that may result from electronic transmissions. I understand that the practice of medicine is not an exact science and that Practitioner makes no warranties or guarantees regarding treatment outcomes. I acknowledge that a copy of this consent can be made available to me via my patient portal ([place]), or I can request a printed copy by calling the office of [facility].    I understand that my insurance will be billed for this visit.   I have read or had this consent read to me. I understand the contents of this consent, which adequately explains the benefits and risks of the Services being provided via telemedicine.  I have been provided ample opportunity to ask questions regarding this consent and the Services and have had my questions answered to my satisfaction. I give my informed consent for the services to be provided through the use of telemedicine in my medical care

## 2021-02-20 NOTE — Assessment & Plan Note (Signed)
Remains on long-term warfarin.  Has recently graduated and history of clot.  Now on lifelong warfarin.  Would need to be bridged with Lovenox for procedures.

## 2021-02-20 NOTE — Assessment & Plan Note (Signed)
Significant family history of CAD, thankfully he is not having any active symptoms.  Continue beta-blocker. Need to figure out how to treat lipids.  Await for follow-up labs and then likely recommend consideration of PCSK9 inhibitor.

## 2021-02-20 NOTE — Assessment & Plan Note (Signed)
Inadequate control.  Ezetimibe does not seem to be doing much.  Due for labs recheck soon. If remain elevated, I think we can consider coronary calcium score evaluation at follow-up visit in order to risk stratify

## 2021-02-20 NOTE — Assessment & Plan Note (Signed)
Blood pressure is doing better.  He is not having hypotension issues very frequently.  Advised adequate hydration  Continue to adequately hydrate to avoid orthostasis.  If not feeling, and not eating/drinking well-> recommend backing off on dose of Toprol to 1/2 tablet.

## 2021-02-20 NOTE — Assessment & Plan Note (Signed)
Symptoms notably improved with increased dose of beta-blocker. Does not want a blood pressure room to titrate anymore.  Significant PAC burden, but not overly symptomatic at this point.  Plan:Keep going with the same dose of Toprol.  It is okay to hold a dose if your blood pressure is low (below 947 mmHg systolic) or if you are not feeling well.  Also may want to consider taking the Toprol dose in the evening at dinnertime as opposed to the morning.  This way if you are not feeling well when you wake up you can just wait to see how you feel in the evening for taking medicine.

## 2021-02-20 NOTE — Assessment & Plan Note (Signed)
Remains on lifelong warfarin.  With the present coagulant and prior clot, would recommend bridging with Lovenox for procedures.

## 2021-03-08 ENCOUNTER — Telehealth: Payer: Self-pay | Admitting: *Deleted

## 2021-03-08 NOTE — Telephone Encounter (Signed)
Pt called requesting his INR check be post poned because his INR was checked on 11/11 at [facility]. His INR was 2.1. Pt denied any bleeding, changes in diet, medication changes and upcoming procedures. Rescheduled pt to come back on 12/13 to have INR checked.

## 2021-04-05 ENCOUNTER — Other Ambulatory Visit: Payer: Self-pay

## 2021-04-05 ENCOUNTER — Ambulatory Visit (INDEPENDENT_AMBULATORY_CARE_PROVIDER_SITE_OTHER): Payer: Medicare Other | Admitting: Pharmacist Clinician (PhC)/ Clinical Pharmacy Specialist

## 2021-04-05 DIAGNOSIS — I823 Embolism and thrombosis of renal vein: Secondary | ICD-10-CM

## 2021-04-05 DIAGNOSIS — Z7901 Long term (current) use of anticoagulants: Secondary | ICD-10-CM | POA: Diagnosis not present

## 2021-04-05 LAB — POCT INR: INR: 2.4 (ref 2.0–3.0)

## 2021-04-29 ENCOUNTER — Encounter: Payer: Self-pay | Admitting: Nurse Practitioner

## 2021-04-29 ENCOUNTER — Telehealth (INDEPENDENT_AMBULATORY_CARE_PROVIDER_SITE_OTHER): Payer: Medicare Other | Admitting: Nurse Practitioner

## 2021-04-29 ENCOUNTER — Other Ambulatory Visit: Payer: Self-pay

## 2021-04-29 VITALS — Temp 101.2°F

## 2021-04-29 DIAGNOSIS — J329 Chronic sinusitis, unspecified: Secondary | ICD-10-CM

## 2021-04-29 DIAGNOSIS — B9689 Other specified bacterial agents as the cause of diseases classified elsewhere: Secondary | ICD-10-CM

## 2021-04-29 MED ORDER — DOXYCYCLINE HYCLATE 100 MG PO TABS: 100.0000 mg | ORAL_TABLET | Freq: Two times a day (BID) | ORAL | 0 refills | Status: AC

## 2021-04-29 MED ORDER — IPRATROPIUM BROMIDE 0.06 % NA SOLN: NASAL | 3 refills | Status: DC

## 2021-04-29 NOTE — Progress Notes (Signed)
Subjective:    Patient ID: Kenneth Cline, male    DOB: 06/10/1955, 66 y.o.   MRN: [id number]  HPI: Kenneth Cline is a 66 y.o. male presenting for cough and congestion.  Chief Complaint  Patient presents with   Cough   Sinusitis   UPPER RESPIRATORY TRACT INFECTION Onset: 1 week ago - started with sinuses, better until this am COVID-19 testing history: yes; negative at home COVID-19 vaccination status: has had all vaccines and flu shot Fever: yes Cough: yes Shortness of breath: no Wheezing: no Chest pain: no Chest tightness: no Chest congestion: yes Nasal congestion: yes; blood and thick mucus Runny nose: yes Post nasal drip: yes Sneezing: yes Sore throat: no Swollen glands: no Sinus pressure: yes Headache: yes Face pain: no Toothache: no Ear pain: yes; left ear  Ear pressure: no  Eyes red/itching:no Eye drainage/crusting: no  Nausea: no  Vomiting: no Diarrhea: yes - comes and goes Change in appetite: no  Loss of taste/smell: no  Rash: no Fatigue: yes Context: better, then worse yesterday Recurrent sinusitis: no Treatments attempted: Mucinex, ipratripium bromide, asetaline spray  Relief with OTC medications: some  Allergies  Allergen Reactions   Biaxin [Clarithromycin] Rash    SJS   Keflex [Cephalexin] Hives, Itching and Rash   Macrolides And Ketolides Other (See Comments)    Unknown   Erythromycin     Was told not to take d/t medical problems   Penicillins     REACTION: rash    Outpatient Encounter Medications as of 04/29/2021  Medication Sig   doxycycline (VIBRA-TABS) 100 MG tablet Take 1 tablet (100 mg total) by mouth 2 (two) times daily for 7 days.   aspirin EC 81 MG tablet Take 81 mg by mouth as directed.   Calcium Carb-Cholecalciferol (CALCIUM + VITAMIN D3 PO) Take by mouth as directed.   clonazePAM (KLONOPIN) 0.5 MG tablet Take 0.5 mg by mouth 2 (two) times daily as needed for anxiety.   ezetimibe (ZETIA) 10 MG tablet TAKE ONE TABLET BY  MOUTH DAILY   ferrous sulfate 325 (65 FE) MG tablet Take 325 mg by mouth daily with breakfast.   fludrocortisone (FLORINEF) 0.1 MG tablet Take 0.1 mg by mouth daily.   gabapentin (NEURONTIN) 300 MG capsule TAKE 1 CAPSULE BY MOUTH 3 TIMES A DAY MAY INCREASE TO UP TO 6 CAPSULES A DAY   ipratropium (ATROVENT) 0.06 % nasal spray 1-2 SPRAYS IN NOSTRILS 3 (THREE) TIMES DAILY AS NEEDED FOR RHINITIS (RUNNY NOSE/POST NASAL DRIP).   levothyroxine (SYNTHROID) 75 MCG tablet 75 mcg. Alternate taking 75 mcg  ( whole tablet)  with 37.5 mcg ( 1/2 tablet) by mouth every other day   magnesium oxide (MAG-OX) 400 MG tablet Take 400 mg by mouth daily.   metFORMIN (GLUCOPHAGE-XR) 500 MG 24 hr tablet Take 1 tablet by mouth 2 (two) times daily.   metoprolol succinate (TOPROL XL) 50 MG 24 hr tablet Take 1 tablet (50 mg total) by mouth daily. Take with or immediately following a meal.   mometasone (NASONEX) 50 MCG/ACT nasal spray Place 2 sprays into the nose daily as needed.   pantoprazole (PROTONIX) 40 MG tablet Take 1 tablet (40 mg total) by mouth 2 (two) times daily.   predniSONE (DELTASONE) 1 MG tablet Take by mouth. Take 8mg  PO Q AM and 2.5mg  PO Q PM   Probiotic Product (PROBIOTIC DAILY PO) Take by mouth.   sertraline (ZOLOFT) 50 MG tablet Take 50 mg by mouth daily.  traMADol (ULTRAM) 50 MG tablet Take by mouth every 6 (six) hours as needed.   ursodiol (ACTIGALL) 500 MG tablet Take 500 mg by mouth 2 (two) times daily.   warfarin (COUMADIN) 5 MG tablet TAKE 1 TO 1.5 TABLETS BY MOUTH DAILY AS DIRECTED BY COUMADIN CLINIC   No facility-administered encounter medications on file as of 04/29/2021.    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   Diarrhea 08/18/2014   Hypothyroidism 08/17/2014   Purpura ([place]) 08/16/2014   Bleeding hemorrhoids 08/16/2014   Fever 08/15/2014   Constipation 08/15/2014   Adrenal insufficiency ([place]) 08/14/2014   Hyponatremia 08/14/2014   Hypokalemia 08/14/2014   Adrenal hemorrhage ([place]) 08/11/2014   Hypertension, essential, benign     Class: Chronic   Dyslipidemia    Overweight (BMI 25.0-29.9)    Erectile dysfunction    Family history of premature coronary artery disease    ALLERGIC REACTION 06/14/2007    Past Medical History:  Diagnosis Date   Adrenal hemorrhage [facility]) April 2016   Following shoulder surgery   Adrenal insufficiency, primary, hemorrhagic ([place]) 07/2014   due to bilateral adrenal hemorrhage   Autoimmune hepatitis ([place])    [place]   Cellulitis 09/2018   septic shock, compartment syndrome s/p fasciotomy RLE at [facility]   CVA (cerebral infarction) January 2017   3 small areas of infarct in both the cerebrum as well as the cerebellum -- , located with diplopia   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  One sister with valve disease.   GERD (gastroesophageal reflux disease)    H/O acute cholangitis May 5631   Complicated by septic shock, acute pancreatitis and acute choledocholithiasis;    Lupus anticoagulant with hemorrhagic disorder (HCC)    Previously on Xarelto. Now on warfarin   Overweight (BMI 25.0-29.9)    PSC (primary sclerosing cholangitis)    managed by [facility] 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 Interim medical history since our last visit reviewed.  Review of Systems Per HPI unless specifically indicated above     Objective:    Temp (!) 101.2 F (38.4 C) (Oral)   Wt Readings from Last 3 Encounters:  02/11/21 185 lb (83.9 kg)  01/24/21 190 lb (86.2 kg)  01/13/21 185 lb (83.9 kg)    Physical  Exam Vitals and nursing note reviewed.  Constitutional:      General: He is not in acute distress.    Appearance: Normal appearance. He is not toxic-appearing.  HENT:     Head: Normocephalic and atraumatic.     Right Ear: External ear normal.     Left Ear: External ear normal.     Nose: Congestion present. No rhinorrhea.     Mouth/Throat:     Mouth: Mucous membranes are moist.     Pharynx: Oropharynx is clear.  Eyes:     General: No scleral icterus. Cardiovascular:     Comments: Unable to assess heart sounds via virtual visit.   Pulmonary:     Comments: Unable to assess lung sounds via virtual visit.  Patient talking in complete sentences during telemedicine visit without accessory muscle use. Skin:    Coloration: Skin is not jaundiced or pale.     Findings: No erythema.  Neurological:     Mental Status: He is alert and oriented to person, place, and time.  Psychiatric:        Mood and Affect: Mood normal.        Behavior: Behavior normal.        Thought Content: Thought content normal.        Judgment: Judgment normal.    Results for orders placed or performed in visit on 04/05/21  POCT INR  Result Value Ref Range   INR 2.4 2.0 - 3.0      Assessment & Plan:   1. Bacterial sinusitis Acute.  Symptoms are consistent with bacterial sinus infection.  Start Doxycycline, multiple medication allergies.  Has appointment to check INR next week, watch for bleeding.  Can continue Mucinex, start nasal rinses, humidifier, push fluids.  Follow up with no improvement.    - doxycycline (VIBRA-TABS) 100 MG tablet; Take 1 tablet (100 mg total) by mouth 2 (two) times daily for 7 days.  Dispense: 14 tablet; Refill: 0   Follow up plan: Return if symptoms worsen or fail to improve.  Due to the catastrophic nature of the COVID-19 pandemic, this video visit was completed soley via audio and visual contact via Caregility due to the restrictions of the COVID-19 pandemic.  All issues as above were discussed and addressed. Physical exam was done as above through visual confirmation on Caregility. If it was felt that the patient should be evaluated in the office, they were directed there. The patient verbally consented to this visit. Location of the patient: home Location of the provider: work Those involved with this call:  Provider: Noemi Chapel, DNP, FNP-C CMA: n/a Front Desk/Registration: Santina Evans  Time spent on call:  8 minutes with patient face to face via video conference. More than 50% of this time was spent in counseling and coordination of care. 15 minutes total spent in review of patient's record and preparation of their chart. I verified patient identity using two factors (patient name and date of birth). Patient consents verbally to being seen via telemedicine visit today.

## 2021-05-15 ENCOUNTER — Other Ambulatory Visit: Payer: Self-pay | Admitting: Family Medicine

## 2021-05-15 ENCOUNTER — Other Ambulatory Visit: Payer: Self-pay | Admitting: Cardiology

## 2021-05-18 ENCOUNTER — Other Ambulatory Visit: Payer: Self-pay

## 2021-05-18 ENCOUNTER — Ambulatory Visit (INDEPENDENT_AMBULATORY_CARE_PROVIDER_SITE_OTHER): Payer: Medicare Other

## 2021-05-18 ENCOUNTER — Other Ambulatory Visit: Payer: Self-pay | Admitting: Family Medicine

## 2021-05-18 ENCOUNTER — Other Ambulatory Visit: Payer: Self-pay | Admitting: Cardiology

## 2021-05-18 DIAGNOSIS — Z5181 Encounter for therapeutic drug level monitoring: Secondary | ICD-10-CM | POA: Diagnosis not present

## 2021-05-18 DIAGNOSIS — Z7901 Long term (current) use of anticoagulants: Secondary | ICD-10-CM | POA: Diagnosis not present

## 2021-05-18 DIAGNOSIS — I823 Embolism and thrombosis of renal vein: Secondary | ICD-10-CM | POA: Diagnosis not present

## 2021-05-18 LAB — POCT INR: INR: 3.5 — AB (ref 2.0–3.0)

## 2021-05-18 NOTE — Patient Instructions (Signed)
HOLD TODAY ONLY and then Continue 1 tablet Daily except 1.5 on Monday and Friday.  Repeat in 4 weeks

## 2021-05-20 ENCOUNTER — Other Ambulatory Visit: Payer: Self-pay | Admitting: Family Medicine

## 2021-05-20 MED ORDER — PREDNISONE 1 MG PO TABS: ORAL_TABLET | ORAL | 3 refills | Status: AC

## 2021-05-20 MED ORDER — LEVOTHYROXINE SODIUM 75 MCG PO TABS: 75.0000 ug | ORAL_TABLET | Freq: Every day | ORAL | 3 refills | Status: DC

## 2021-05-20 MED ORDER — URSODIOL 500 MG PO TABS: 500.0000 mg | ORAL_TABLET | Freq: Two times a day (BID) | ORAL | 3 refills | Status: DC

## 2021-06-15 ENCOUNTER — Ambulatory Visit (INDEPENDENT_AMBULATORY_CARE_PROVIDER_SITE_OTHER): Payer: Medicare Other

## 2021-06-15 ENCOUNTER — Other Ambulatory Visit: Payer: Self-pay

## 2021-06-15 DIAGNOSIS — Z7901 Long term (current) use of anticoagulants: Secondary | ICD-10-CM | POA: Diagnosis not present

## 2021-06-15 DIAGNOSIS — I823 Embolism and thrombosis of renal vein: Secondary | ICD-10-CM | POA: Diagnosis not present

## 2021-06-15 DIAGNOSIS — Z5181 Encounter for therapeutic drug level monitoring: Secondary | ICD-10-CM

## 2021-06-15 LAB — POCT INR: INR: 2.2 (ref 2.0–3.0)

## 2021-06-15 NOTE — Patient Instructions (Signed)
Continue 1 tablet Daily except 1.5 on Monday and Friday.  Repeat in 8 weeks

## 2021-07-04 ENCOUNTER — Telehealth: Payer: Self-pay | Admitting: Family Medicine

## 2021-07-04 MED ORDER — SERTRALINE HCL 50 MG PO TABS: 50.0000 mg | ORAL_TABLET | Freq: Every day | ORAL | 3 refills | Status: DC

## 2021-07-04 NOTE — Telephone Encounter (Signed)
Rx sent to pharmacy   

## 2021-07-04 NOTE — Telephone Encounter (Signed)
Received eFax from pharmacy to request refill authorization of ? ?sertraline (ZOLOFT) 50 MG tablet ? ? ?Pharmacy fax received from: ? ?[facility] [id number], [place] [address]  ?[place] [address] [place] [address]  ?Phone:  [phone]  Fax:  [phone] ? ?Please advise pharmacist.  ? ?

## 2021-07-17 ENCOUNTER — Other Ambulatory Visit: Payer: Self-pay | Admitting: Cardiology

## 2021-08-11 ENCOUNTER — Ambulatory Visit (INDEPENDENT_AMBULATORY_CARE_PROVIDER_SITE_OTHER): Payer: Medicare Other

## 2021-08-11 DIAGNOSIS — Z5181 Encounter for therapeutic drug level monitoring: Secondary | ICD-10-CM | POA: Diagnosis not present

## 2021-08-11 DIAGNOSIS — Z7901 Long term (current) use of anticoagulants: Secondary | ICD-10-CM | POA: Diagnosis not present

## 2021-08-11 DIAGNOSIS — I823 Embolism and thrombosis of renal vein: Secondary | ICD-10-CM

## 2021-08-11 LAB — POCT INR: INR: 2.3 (ref 2.0–3.0)

## 2021-08-11 NOTE — Patient Instructions (Signed)
Continue 1 tablet Daily except 1.5 on Monday and Friday.  Repeat in 8 weeks. [phone] 

## 2021-08-13 ENCOUNTER — Other Ambulatory Visit: Payer: Self-pay | Admitting: Cardiology

## 2021-08-24 ENCOUNTER — Other Ambulatory Visit: Payer: Self-pay

## 2021-08-24 NOTE — Telephone Encounter (Signed)
Requested medication (s) are due for refill today - expired Rx ? ?Requested medication (s) are on the active medication list -yes ? ?Future visit scheduled -yes ? ?Last refill: -07/29/20 #180 4RF ? ?Notes to clinic: Call to patient- appointment scheduled- request forwarded for PCP review  ? ?Requested Prescriptions  ?Pending Prescriptions Disp Refills  ? gabapentin (NEURONTIN) 300 MG capsule 180 capsule 4  ?  Sig: TAKE 1 CAPSULE BY MOUTH 3 TIMES A DAY MAY INCREASE TO UP TO 6 CAPSULES A DAY  ?  ? Neurology: Anticonvulsants - gabapentin Passed - 08/24/2021 12:22 PM  ?  ?  Passed - Cr in normal range and within 360 days  ?  Creat  ?Date Value Ref Range Status  ?11/19/2020 1.19 0.70 - 1.35 mg/dL Final  ?  ?  ?  ?  Passed - Completed PHQ-2 or PHQ-9 in the last 360 days  ?  ?  Passed - Valid encounter within last 12 months  ?  Recent Outpatient Visits   ? ?      ? 3 months ago Bacterial sinusitis  ? [place], Jessica A, NP  ? 7 months ago Septic olecranon bursitis of left elbow  ? [facility] Pickard, Cammie Mcgee, MD  ? 7 months ago Left elbow pain  ? [place], Jessica A, NP  ? 9 months ago Irregular heart beats  ? [facility] Pickard, Cammie Mcgee, MD  ? 1 year ago Encounter for immunization  ? [facility] Bacigalupo, Dionne Bucy, MD  ? ?  ?  ?Future Appointments   ? ?        ? In 6 days Pickard, Cammie Mcgee, MD [place]  ? ?  ? ? ?  ?  ?  ? ? ? ?Requested Prescriptions  ?Pending Prescriptions Disp Refills  ? gabapentin (NEURONTIN) 300 MG capsule 180 capsule 4  ?  Sig: TAKE 1 CAPSULE BY MOUTH 3 TIMES A DAY MAY INCREASE TO UP TO 6 CAPSULES A DAY  ?  ? Neurology: Anticonvulsants - gabapentin Passed - 08/24/2021 12:22 PM  ?  ?  Passed - Cr in normal range and within 360 days  ?  Creat  ?Date Value Ref Range Status  ?11/19/2020 1.19 0.70 - 1.35 mg/dL Final  ?  ?  ?  ?  Passed - Completed PHQ-2 or PHQ-9 in the last  360 days  ?  ?  Passed - Valid encounter within last 12 months  ?  Recent Outpatient Visits   ? ?      ? 3 months ago Bacterial sinusitis  ? [place], Jessica A, NP  ? 7 months ago Septic olecranon bursitis of left elbow  ? [facility] Pickard, Cammie Mcgee, MD  ? 7 months ago Left elbow pain  ? [place], Jessica A, NP  ? 9 months ago Irregular heart beats  ? [facility] Pickard, Cammie Mcgee, MD  ? 1 year ago Encounter for immunization  ? [facility] Bacigalupo, Dionne Bucy, MD  ? ?  ?  ?Future Appointments   ? ?        ? In 6 days Pickard, Cammie Mcgee, MD [place], [facility]  ? ?  ? ? ?  ?  ?  ? ? ? ?

## 2021-08-24 NOTE — Telephone Encounter (Signed)
Pharmacy faxed a refill request for ? ?gabapentin (NEURONTIN) 300 MG capsule [[id number]]  ?  Order Details ?Dose, Route, Frequency: As Directed  ?Dispense Quantity: 180 capsule Refills: 4   ?Note to Pharmacy: Requires office visit before any further refills can be given.   ?     ?Sig: TAKE 1 CAPSULE BY MOUTH 3 TIMES A DAY MAY INCREASE TO UP TO 6 CAPSULES A DAY  ?     ?Start Date: 07/29/20 End Date: --  ?Written Date: 07/29/20 Expiration Date: 07/29/21  ? ?

## 2021-08-25 MED ORDER — GABAPENTIN 300 MG PO CAPS: ORAL_CAPSULE | ORAL | 4 refills | Status: DC

## 2021-08-30 ENCOUNTER — Ambulatory Visit: Payer: Medicare Other | Admitting: Family Medicine

## 2021-09-02 ENCOUNTER — Ambulatory Visit (INDEPENDENT_AMBULATORY_CARE_PROVIDER_SITE_OTHER): Payer: Medicare Other | Admitting: Family Medicine

## 2021-09-02 VITALS — BP 126/78 | HR 62 | Temp 97.2°F | Ht 68.0 in | Wt 182.0 lb

## 2021-09-02 DIAGNOSIS — N401 Enlarged prostate with lower urinary tract symptoms: Secondary | ICD-10-CM | POA: Diagnosis not present

## 2021-09-02 DIAGNOSIS — Z8673 Personal history of transient ischemic attack (TIA), and cerebral infarction without residual deficits: Secondary | ICD-10-CM

## 2021-09-02 DIAGNOSIS — E039 Hypothyroidism, unspecified: Secondary | ICD-10-CM

## 2021-09-02 DIAGNOSIS — Z125 Encounter for screening for malignant neoplasm of prostate: Secondary | ICD-10-CM

## 2021-09-02 DIAGNOSIS — Z23 Encounter for immunization: Secondary | ICD-10-CM

## 2021-09-02 DIAGNOSIS — I823 Embolism and thrombosis of renal vein: Secondary | ICD-10-CM | POA: Diagnosis not present

## 2021-09-02 DIAGNOSIS — K8309 Other cholangitis: Secondary | ICD-10-CM

## 2021-09-02 DIAGNOSIS — R350 Frequency of micturition: Secondary | ICD-10-CM

## 2021-09-02 NOTE — Addendum Note (Signed)
Addended by: Anastasio Auerbach R on: 09/02/2021 10:56 AM ? ? Modules accepted: Orders ? ?

## 2021-09-02 NOTE — Progress Notes (Signed)
? ?Subjective:  ? ? Patient ID: Kenneth Cline, male    DOB: [birth year], 66 y.o.   MRN: [id number] ? ?HPI ?Patient is here today for follow-up.  He sees an endocrinologist who is managing his hypothyroidism as well as his diabetes.  However no one has checked his cholesterol in quite some time.  He is currently taking Zetia.  He denies any myalgias right upper quadrant pain.  He denies any chest pain or shortness of breath.  Cardiology is monitoring his Coumadin for which he takes given his history of lupus anticoagulant and thrombosis with embolic stroke.  He is due for prostate cancer screening.  He does report some lower urinary tract symptoms such as decreased urine stream and increased urinary frequency.  He is also due for Prevnar 20.  He is taking sertraline and has been on sertraline for many years since his stroke.  He is not sure that he needs the medication anymore he seldom has to use the Klonopin.  Therefore we suggested weaning off the sertraline to reduce some of his medication. ?He has a very complicated past medical history. He has a history of lupus anticoagulant. 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  He is on chronic fludrocortisone as well as prednisone for this. He has a history of type 2 diabetes as well as hypothyroidism which is treated by endocrinology. He also has a history of acute cholangitis requiring ERCP and bile duct stenting.  This is due to primary sclerosing cholangitis ? ?Past Medical History:  ?Diagnosis Date  ? Adrenal hemorrhage [facility]) April 2016  ? Following shoulder surgery  ? Adrenal insufficiency, primary, hemorrhagic ([place]) 07/2014  ? due to bilateral adrenal hemorrhage  ? Autoimmune hepatitis ([place])   ? PSC  ? Cellulitis 09/2018  ? septic shock, compartment syndrome s/p fasciotomy RLE at [facility]  ? CVA (cerebral infarction) January 2017  ? 3 small areas of infarct  in both the cerebrum as well as the cerebellum -- , located with diplopia  ? Cyclic neutropenia (HCC)   ? Status post bone marrow transplant  ? Dyslipidemia   ? Erectile dysfunction   ? Essential hypertension   ? Family history of premature coronary artery disease   ? 2 brothers, one sister and father all with CAD.  One sister with valve disease.  ? GERD (gastroesophageal reflux disease)   ? H/O acute cholangitis May 2016  ? Complicated by septic shock, acute pancreatitis and acute choledocholithiasis;   ? Lupus anticoagulant with hemorrhagic disorder (HCC)   ? Previously on Xarelto. Now on warfarin  ? Overweight (BMI 25.0-29.9)   ? PSC (primary sclerosing cholangitis)   ? managed by Dr. Koleen Nimrod at [facility]  ? Recurrent cholangitis 7/'16, 10/'16, 11/'16 & 2/'17  ? Initial ERCP with biliary stent placement followed by cholecystectomy forr cholecystitis- seen at [facility] and [place] felt to be [place]  ? Septic shock due to Escherichia coli [facility]) October 2016; January 2017  ? Escherichia coli bacteremia secondary to recurrent cholangitis -- status post ERCP  ? Stevens-Johnson disease ([place]) 04/08/2013   ? Reaction to Biaxin  ? 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 due to lupus anticoagulant on chronic anticoagulation  ? Type 2 diabetes mellitus [facility]) February 2017  ? ?Past Surgical History:  ?Procedure Laterality Date  ? CPET/MET  11/27/2011  ? normal PFT, good effort-no ischemia burden  ? ERCP  W/ METAL STENT PLACEMENT  10/'16; 11/'16  ? [facility] - stent removal  ? ERCP W/ METAL STENT PLACEMENT  06/01/2015  ? [place]. Likely cholangitis  ? ERCP W/ PLASTIC STENT PLACEMENT  Sep 09 2014  ? [facility] - Dr. Alvera Novel  ? ERCP W/ SPHINCTEROTOMY AND BALLOON DILATION  11/10/2014  ? [facility] -- Removal of biliary stent,, to PACU duodenitis  ? LAPAROSCOPIC CHOLECYSTECTOMY W/ CHOLANGIOGRAPHY  November 13 2014  ? [facility], Dr. Warnell Bureau [place]  ? NM MYOCAR PERF WALL MOTION  06/28/2006  ? EF 79% , LV systolic fx norm.   ? ROTATOR CUFF REPAIR    ? TRANSTHORACIC ECHOCARDIOGRAM  04/2015  ? No pericardial effusion. No shunt. Full study: Basal septal LVH. Pseudo-normal filling (GR 2 DD). EF 57%.  ? TRANSTHORACIC ECHOCARDIOGRAM  08/2015  ? [facility]): Normal LV size. Basal septal hypertrophy. EF 57%. Pseudo-normal filling. Mild focal aortic valve thickening. (Study was done to evaluate for possible endocarditis)  ? ?Current Outpatient Medications on File Prior to Visit  ?Medication Sig Dispense Refill  ? aspirin EC 81 MG tablet Take 81 mg by mouth as directed.    ? Calcium Carb-Cholecalciferol (CALCIUM + VITAMIN D3 PO) Take by mouth as directed.    ? clonazePAM (KLONOPIN) 0.5 MG tablet Take 0.5 mg by mouth 2 (two) times daily as needed for anxiety.    ? ezetimibe (ZETIA) 10 MG tablet TAKE ONE TABLET BY MOUTH DAILY 90 tablet 2  ? ferrous sulfate 325 (65 FE) MG tablet Take 325 mg by mouth daily with breakfast.    ? fludrocortisone (FLORINEF) 0.1 MG tablet Take 0.1 mg by mouth daily.    ? gabapentin (NEURONTIN) 300 MG capsule TAKE 1 CAPSULE BY MOUTH 3 TIMES A DAY MAY INCREASE TO UP TO 6 CAPSULES A DAY 180 capsule 4  ? ipratropium (ATROVENT) 0.06 % nasal spray 1-2 SPRAYS IN NOSTRILS 3 (THREE) TIMES DAILY AS NEEDED FOR RHINITIS (RUNNY NOSE/POST NASAL DRIP). 45 mL 3  ? levothyroxine (SYNTHROID) 75 MCG tablet Take 1 tablet (75 mcg total) by mouth daily before breakfast. Alternate taking 75 mcg  ( whole tablet)  with 37.5 mcg ( 1/2 tablet) by mouth every other day 90 tablet 3  ? magnesium oxide (MAG-OX) 400 MG tablet Take 400 mg by mouth daily.    ? metFORMIN (GLUCOPHAGE-XR) 500 MG 24 hr tablet Take 1 tablet by mouth 2 (two) times daily.    ? metoprolol succinate (TOPROL XL) 50 MG 24 hr tablet Take 1 tablet (50 mg total) by mouth daily. Take with or immediately following a meal. 90 tablet 3  ? mometasone (NASONEX) 50 MCG/ACT nasal spray Place 2 sprays into the nose daily as needed.    ? pantoprazole (PROTONIX) 40 MG tablet TAKE ONE TABLET BY MOUTH  TWICE A DAY 180 tablet 1  ? predniSONE (DELTASONE) 1 MG tablet Take 35m PO Q AM and 2.543mPO Q PM 300 tablet 3  ? Probiotic Product (PROBIOTIC DAILY PO) Take by mouth.    ? sertraline (ZOLOFT) 50 MG tablet Take 1 tablet (50 mg total) by mouth daily. 30 tablet 3  ? traMADol (ULTRAM) 50 MG tablet Take by mouth every 6 (six) hours as needed.    ? ursodiol (ACTIGALL) 500 MG tablet Take 1 tablet (500 mg total) by mouth 2 (two) times daily. 180 tablet 3  ? warfarin (COUMADIN) 5 MG tablet TAKE 1-2 TABLETS BY MOUTH DAILY OR AS DIRECTED BY COUMADIN CLINIC  135 tablet 1  ? ?No current facility-administered medications on file prior to visit.  ? ?Allergies  ?Allergen Reactions  ? Biaxin [Clarithromycin] Rash  ?  SJS  ? Keflex [Cephalexin] Hives, Itching and Rash  ? Macrolides And Ketolides Other (See Comments)  ?  Unknown  ? Erythromycin   ?  Was told not to take d/t medical problems  ? Penicillins   ?  REACTION: rash  ? ?Social History  ? ?Socioeconomic History  ? Marital status: Married  ?  Spouse name: Not on file  ? Number of children: 2  ? Years of education: Not on file  ? Highest education level: Not on file  ?Occupational History  ?  Employer: [employer]  ?Tobacco Use  ? Smoking status: Former  ?  Packs/day: 0.50  ?  Types: Cigarettes  ? Smokeless tobacco: Never  ?Substance and Sexual Activity  ? Alcohol use: Not Currently  ?  Alcohol/week: 3.0 standard drinks  ?  Types: 3 Standard drinks or equivalent per week  ?  Comment: occassional  ? Drug use: No  ? Sexual activity: Not Currently  ?  Birth control/protection: Surgical, None  ?Other Topics Concern  ? Not on file  ?Social History Narrative  ? He is a married father of 2 with 3 stepchildren.  He has 3 grandchildren his own and 3 step grandchildren.  He works as a Dealer for [employer].  He lives with his wife, [name], of 8 years.  He does not, and never did smoke.  He takes occasional alcohol beverage but nothing significant.  He does not do routine exercise, but does walk a lot  at work.  ? ?Social Determinants of Health  ? ?Financial Resource Strain: Low Risk   ? Difficulty of Paying Living Expenses: Not hard at all  ?Food Insecurity: No Food Insecurity  ? Worried About [employer]

## 2021-09-03 LAB — PSA: PSA: 0.78 ng/mL (ref <=4.00)

## 2021-09-03 LAB — CBC WITH DIFFERENTIAL/PLATELET
Absolute Monocytes: 667 cells/uL (ref 200–950)
Basophils Absolute: 40 cells/uL (ref 0–200)
Basophils Relative: 0.6 %
Eosinophils Absolute: 198 cells/uL (ref 15–500)
Eosinophils Relative: 3 %
HCT: 44.5 % (ref 38.5–50.0)
Hemoglobin: 14.8 g/dL (ref 13.2–17.1)
Lymphs Abs: 1815 cells/uL (ref 850–3900)
MCH: 30.3 pg (ref 27.0–33.0)
MCHC: 33.3 g/dL (ref 32.0–36.0)
MCV: 91 fL (ref 80.0–100.0)
MPV: 9.9 fL (ref 7.5–12.5)
Monocytes Relative: 10.1 %
Neutro Abs: 3881 cells/uL (ref 1500–7800)
Neutrophils Relative %: 58.8 %
Platelets: 215 10*3/uL (ref 140–400)
RBC: 4.89 10*6/uL (ref 4.20–5.80)
RDW: 12.9 % (ref 11.0–15.0)
Total Lymphocyte: 27.5 %
WBC: 6.6 10*3/uL (ref 3.8–10.8)

## 2021-09-03 LAB — COMPLETE METABOLIC PANEL WITH GFR
AG Ratio: 2.2 (calc) (ref 1.0–2.5)
ALT: 20 U/L (ref 9–46)
AST: 21 U/L (ref 10–35)
Albumin: 4.3 g/dL (ref 3.6–5.1)
Alkaline phosphatase (APISO): 84 U/L (ref 35–144)
BUN: 24 mg/dL (ref 7–25)
CO2: 23 mmol/L (ref 20–32)
Calcium: 8.4 mg/dL — ABNORMAL LOW (ref 8.6–10.3)
Chloride: 104 mmol/L (ref 98–110)
Creat: 1.21 mg/dL (ref 0.70–1.35)
Globulin: 2 g/dL (calc) (ref 1.9–3.7)
Glucose, Bld: 125 mg/dL — ABNORMAL HIGH (ref 65–99)
Potassium: 4.5 mmol/L (ref 3.5–5.3)
Sodium: 137 mmol/L (ref 135–146)
Total Bilirubin: 0.6 mg/dL (ref 0.2–1.2)
Total Protein: 6.3 g/dL (ref 6.1–8.1)
eGFR: 66 mL/min/{1.73_m2} (ref >=60)

## 2021-09-03 LAB — LIPID PANEL
Cholesterol: 196 mg/dL (ref <200)
HDL: 44 mg/dL (ref >=40)
LDL Cholesterol (Calc): 115 mg/dL (calc) — ABNORMAL HIGH
Non-HDL Cholesterol (Calc): 152 mg/dL (calc) — ABNORMAL HIGH (ref <130)
Total CHOL/HDL Ratio: 4.5 (calc) (ref <5.0)
Triglycerides: 243 mg/dL — ABNORMAL HIGH (ref <150)

## 2021-09-29 ENCOUNTER — Telehealth: Payer: Self-pay

## 2021-09-29 ENCOUNTER — Other Ambulatory Visit: Payer: Self-pay

## 2021-09-29 DIAGNOSIS — Z7901 Long term (current) use of anticoagulants: Secondary | ICD-10-CM

## 2021-09-29 NOTE — Telephone Encounter (Signed)
Pt called and stated that they couldn't make appt for coumadin b/c they are out of town. So they stated that they would [facility] some [facility] and call us when they have done so. I placed inr orders.

## 2021-11-11 ENCOUNTER — Other Ambulatory Visit: Payer: Self-pay | Admitting: Family Medicine

## 2021-11-11 NOTE — Telephone Encounter (Signed)
Requested Prescriptions  Pending Prescriptions Disp Refills  . pantoprazole (PROTONIX) 40 MG tablet [Pharmacy Med Name: PANTOPRAZOLE SOD DR 40 MG TAB] 180 tablet 1    Sig: TAKE ONE TABLET BY MOUTH TWICE A DAY     Gastroenterology: Proton Pump Inhibitors Passed - 11/11/2021 12:53 PM      Passed - Valid encounter within last 12 months    Recent Outpatient Visits          2 months ago History of CVA (cerebrovascular accident)   [place] Susy Frizzle, MD   6 months ago Bacterial sinusitis   [place] Eulogio Bear, NP   9 months ago Septic olecranon bursitis of left elbow   [place] Susy Frizzle, MD   10 months ago Left elbow pain   [place], NP   11 months ago Irregular heart beats   [place] Pickard, Cammie Mcgee, MD

## 2021-12-06 ENCOUNTER — Ambulatory Visit (INDEPENDENT_AMBULATORY_CARE_PROVIDER_SITE_OTHER): Payer: Medicare Other

## 2021-12-06 DIAGNOSIS — Z7901 Long term (current) use of anticoagulants: Secondary | ICD-10-CM

## 2021-12-06 DIAGNOSIS — I823 Embolism and thrombosis of renal vein: Secondary | ICD-10-CM

## 2021-12-06 DIAGNOSIS — Z5181 Encounter for therapeutic drug level monitoring: Secondary | ICD-10-CM

## 2021-12-06 LAB — POCT INR: INR: 2.4 (ref 2.0–3.0)

## 2021-12-06 NOTE — Patient Instructions (Signed)
Continue 1 tablet Daily except 1.5 on Monday and Friday.  Repeat in 8 weeks. [id number]

## 2022-01-02 ENCOUNTER — Ambulatory Visit (INDEPENDENT_AMBULATORY_CARE_PROVIDER_SITE_OTHER): Payer: Medicare Other | Admitting: Family Medicine

## 2022-01-02 VITALS — BP 118/70 | HR 69 | Temp 98.2°F | Ht 68.0 in | Wt 179.0 lb

## 2022-01-02 DIAGNOSIS — J40 Bronchitis, not specified as acute or chronic: Secondary | ICD-10-CM

## 2022-01-02 MED ORDER — PREDNISONE 20 MG PO TABS: ORAL_TABLET | ORAL | 0 refills | Status: DC

## 2022-01-02 MED ORDER — DOXYCYCLINE HYCLATE 100 MG PO TABS: 100.0000 mg | ORAL_TABLET | Freq: Two times a day (BID) | ORAL | 0 refills | Status: DC

## 2022-01-02 NOTE — Progress Notes (Signed)
Subjective:    Patient ID: Kenneth Cline, male    DOB: [birth year], 66 y.o.   MRN: [id number]  HPI He has a very complicated past medical history. He has a history of lupus anticoagulant. 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  He is on chronic fludrocortisone as well as prednisone for this. He has a history of type 2 diabetes as well as hypothyroidism which is treated by endocrinology. He also has a history of acute cholangitis requiring ERCP and bile duct stenting.  This is due to primary sclerosing cholangitis.  Patient is on chronic prednisone for this reason.  For the last week he has been dealing with a constant cough.  He states that he hears himself wheezing at night when he is lying down.  Today on examination he has rhonchorous breath sounds bilaterally and faint expiratory wheezing.  He denies any chest pain or shortness of breath.  He denies any fevers or chills.  He denies any hemoptysis or purulent sputum.  The most part is yellow and clear.  He denies any sinus pain.  He denies any headaches.  He denies any otalgia.  Seen examined acute renal failure Plan Subjective    So he is a little and I was honest unless he truly is blowing out what caused failure I would say less than to try to treat the swelling it was just a little bit of puffiness in the ankles went to kidney failure.  Platelets were worsened proximally was this a.m. 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  One sister with valve disease.   GERD (gastroesophageal reflux disease)    H/O acute cholangitis May 7035   Complicated by septic shock, acute pancreatitis and acute choledocholithiasis;    Lupus anticoagulant with hemorrhagic disorder (HCC)    Previously on Xarelto. Now on warfarin   Overweight (BMI 25.0-29.9)    PSC (primary sclerosing cholangitis)    managed by Dr. 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 Likely cholangitis   ERCP W/ PLASTIC STENT PLACEMENT  Sep 09 2014   [facility] - Dr. Alvera Novel   ERCP W/ SPHINCTEROTOMY AND BALLOON DILATION  11/10/2014   [facility] -- Removal of biliary stent,, to PACU duodenitis   LAPAROSCOPIC CHOLECYSTECTOMY W/ CHOLANGIOGRAPHY  November 13 2014   [facility], Dr. Warnell Bureau [place]   NM MYOCAR PERF WALL MOTION  06/28/2006   EF 32% , LV systolic fx norm.   ROTATOR  CUFF REPAIR     TRANSTHORACIC ECHOCARDIOGRAM  04/2015   No pericardial effusion. No shunt. Full study: Basal septal LVH. Pseudo-normal filling (GR 2 DD). EF 57%.   TRANSTHORACIC ECHOCARDIOGRAM  08/2015   [facility]): Normal LV size. Basal septal hypertrophy. EF 57%. Pseudo-normal filling. Mild focal aortic valve thickening. (Study was done to evaluate for possible endocarditis)   Current Outpatient Medications on File Prior to Visit  Medication Sig Dispense Refill   aspirin EC 81 MG tablet Take 81 mg by mouth as directed.     Calcium Carb-Cholecalciferol (CALCIUM + VITAMIN D3 PO) Take by mouth as directed.     clonazePAM (KLONOPIN) 0.5 MG tablet Take 0.5 mg by mouth 2 (two) times daily as needed for anxiety.     ezetimibe (ZETIA) 10 MG tablet TAKE ONE TABLET BY MOUTH DAILY 90 tablet 2   ferrous sulfate 325 (65 FE) MG tablet Take 325 mg by mouth daily with breakfast.     fludrocortisone (FLORINEF) 0.1 MG tablet Take 0.1 mg by mouth daily.     gabapentin (NEURONTIN) 300 MG capsule TAKE 1 CAPSULE BY MOUTH 3 TIMES A DAY MAY INCREASE TO UP TO 6 CAPSULES A DAY 180 capsule 4   ipratropium (ATROVENT) 0.06 % nasal spray 1-2 SPRAYS IN NOSTRILS 3 (THREE) TIMES DAILY AS NEEDED FOR RHINITIS (RUNNY NOSE/POST NASAL DRIP). 45 mL 3   levothyroxine (SYNTHROID) 75 MCG tablet Take 1 tablet (75 mcg total) by mouth daily before breakfast. Alternate taking 75 mcg  ( whole tablet)  with 37.5 mcg ( 1/2 tablet) by mouth every other day 90 tablet 3   magnesium oxide (MAG-OX) 400 MG tablet Take 400 mg by mouth daily.     metFORMIN (GLUCOPHAGE-XR) 500 MG 24 hr tablet Take 1 tablet by mouth 2 (two) times daily.     metoprolol succinate (TOPROL XL) 50 MG 24 hr tablet Take 1 tablet (50 mg total) by mouth daily. Take with or immediately following a meal. 90 tablet 3   mometasone (NASONEX) 50 MCG/ACT nasal spray Place 2 sprays into the nose daily as needed.     pantoprazole (PROTONIX) 40 MG tablet TAKE ONE TABLET BY MOUTH TWICE A DAY  180 tablet 1   predniSONE (DELTASONE) 1 MG tablet Take $RemoveBef'8mg'tSahkNxsci$  PO Q AM and 2.$Remove'5mg'kAqImhw$  PO Q PM 300 tablet 3   Probiotic Product (PROBIOTIC DAILY PO) Take by mouth.     sertraline (ZOLOFT) 50 MG tablet Take 1 tablet (50 mg total) by mouth daily. 30 tablet 3   traMADol (ULTRAM) 50 MG tablet Take by mouth every 6 (six) hours as needed.     ursodiol (ACTIGALL) 500 MG tablet Take 1 tablet (500 mg total) by mouth 2 (two) times daily. 180 tablet 3   warfarin (COUMADIN) 5 MG tablet TAKE 1-2 TABLETS BY  MOUTH DAILY OR AS DIRECTED BY COUMADIN CLINIC 135 tablet 1   No current facility-administered medications on file prior to visit.   Allergies  Allergen Reactions   Biaxin [Clarithromycin] Rash    SJS   Keflex [Cephalexin] Hives, Itching and Rash   Macrolides And Ketolides Other (See Comments)    Unknown   Erythromycin     Was told not to take d/t medical problems   Penicillins     REACTION: rash   Social History   Socioeconomic History   Marital status: Married    Spouse name: Not on file   Number of children: 2   Years of education: Not on file   Highest education level: Not on file  Occupational History    Employer: [employer]  Tobacco Use   Smoking status: Former    Packs/day: 0.50    Types: Cigarettes   Smokeless tobacco: Never  Substance and Sexual Activity   Alcohol use: Not Currently    Alcohol/week: 3.0 standard drinks of alcohol    Types: 3 Standard drinks or equivalent per week    Comment: occassional   Drug use: No   Sexual activity: Not Currently    Birth control/protection: Surgical, None  Other Topics Concern   Not on file  Social History Narrative   He is a married father of 2 with 3 stepchildren.  He has 3 grandchildren his own and 3 step grandchildren.  He works as a Dealer for [employer].  He lives with his wife, [name], of 8 years.  He does not, and never did smoke.  He takes occasional alcohol beverage but nothing significant.  He does not do routine exercise, but does walk a lot  at work.   Social Determinants of Health   Financial Resource Strain: Low Risk  (01/13/2021)   Overall Financial Resource Strain (CARDIA)    Difficulty of Paying Living Expenses: Not hard at all  Food Insecurity: No Food Insecurity (01/13/2021)   Hunger Vital Sign    Worried About Running Out of Food in the Last Year: Never true    Ran Out of Food in the Last Year: Never true  Transportation Needs: No Transportation Needs (01/13/2021)   PRAPARE - Hydrologist (Medical): No    Lack of Transportation (Non-Medical): No  Physical Activity: Sufficiently Active (01/13/2021)   Exercise Vital Sign    Days of Exercise per Week: 5 days    Minutes of Exercise per Session: 60 min  Stress: No Stress Concern Present (01/13/2021)   [place]    Feeling of Stress : Not at all  Social Connections: Moderately Isolated (01/13/2021)   Social Connection and Isolation Panel [NHANES]    Frequency of Communication with Friends and Family: More than three times a week    Frequency of Social Gatherings with Friends and Family: More than three times a week    Attends Religious Services: Never    Marine scientist or Organizations: No    Attends Archivist Meetings: Never    Marital Status: Married  Human resources officer Violence: Not At Risk (01/13/2021)   Humiliation, Afraid, Rape, and Kick questionnaire    Fear of Current or Ex-Partner: No    Emotionally Abused: No    Physically Abused: No    Sexually Abused: No     Review of Systems     Objective:   Physical Exam Constitutional:  General: He is not in acute distress.    Appearance: He is not diaphoretic.  HENT:     Head: Normocephalic and atraumatic.     Right Ear: Tympanic membrane, ear canal and external ear normal.     Left Ear: Tympanic membrane, ear canal and external ear normal.     Nose: Nose normal. No congestion or rhinorrhea.   Neck:     Thyroid: No thyromegaly.     Vascular: No JVD.     Trachea: No tracheal deviation.  Cardiovascular:     Rate and Rhythm: Regular rhythm.     Heart sounds: No murmur heard.    No friction rub. No gallop.  Pulmonary:     Effort: No respiratory distress.     Breath sounds: No stridor. Wheezing and rhonchi present. No rales.  Chest:     Chest wall: No tenderness.  Musculoskeletal:     Cervical back: Normal range of motion and neck supple.  Lymphadenopathy:     Cervical: No cervical adenopathy.  Skin:    General: Skin is warm.     Coloration: Skin is not pale.     Findings: No erythema or rash.  Neurological:     Mental Status: He is alert and oriented to person, place, and time.     Cranial Nerves: No cranial nerve deficit.     Motor: No abnormal muscle tone.     Coordination: Coordination normal.     Deep Tendon Reflexes: Reflexes are normal and symmetric. Reflexes normal.          Assessment & Plan:  Bronchitis I believe the patient has a viral bronchitis.  We will put the patient temporarily on a prednisone taper pack due to the wheezing.  I recommended tincture of time.  If he develops purulent sputum or high fever I did give him doxycycline for bacterial bronchitis however he would need to come in after 4 days to recheck an INR after starting the doxycycline.  Patient will take the prednisone and simply monitor the situation for the next few days due to the wheezing and hold off on taking the doxycycline unless symptoms worsen dramatically.

## 2022-01-03 ENCOUNTER — Other Ambulatory Visit: Payer: Self-pay | Admitting: Cardiology

## 2022-01-09 ENCOUNTER — Other Ambulatory Visit: Payer: Self-pay

## 2022-01-09 ENCOUNTER — Other Ambulatory Visit: Payer: Self-pay | Admitting: Cardiology

## 2022-01-09 NOTE — Telephone Encounter (Addendum)
Received call from pt stating he was out of town and was prescribed doxycycline '100mg'$  BID for 7 days. First dose was 01/08/22. Educated pt that this medication can interact with Warfarin and increases INR.   Since INR has not been checked since 12/06/21, I explained to the patient it is hard to determine how many days he should hold/decrease his Warfarin. I instructed pt to only take 1 tablet today instead of 1.5 tablets, increase green intake and he needs to have INR checked on Thursday. Pt stated he will not be back into town for 2-3 more weeks; [place] [facility]in Clinic with results. Provided pt with direct Coumadin Clinic number [phone].

## 2022-01-11 ENCOUNTER — Telehealth: Payer: Self-pay | Admitting: *Deleted

## 2022-01-11 NOTE — Telephone Encounter (Signed)
Pt called and stated he needed the order to have his INR faxed to [place]. He stated he had spoken to someone in reference to this recently. After a short discussion, pt is out of town at [employer] and started doxycycline 4 days ago per his report. Advised that we can send an order for this and he gave me the fax number of [phone] & received confirmation via fax for this.    Pt called back on 01/12/2022 at 824am and stated that the order was receivd twice but they are unable to read it because it is dark. Re-faxed using a different method. Will call that office to see if received.

## 2022-01-13 ENCOUNTER — Ambulatory Visit: Payer: Medicare Other

## 2022-01-13 LAB — PROTIME-INR
INR: 2.7 — ABNORMAL HIGH (ref 0.9–1.2)
Prothrombin Time: 27.6 s — ABNORMAL HIGH (ref 9.1–12.0)

## 2022-01-18 ENCOUNTER — Ambulatory Visit (INDEPENDENT_AMBULATORY_CARE_PROVIDER_SITE_OTHER): Payer: Medicare Other

## 2022-01-18 VITALS — Ht 68.0 in | Wt 179.0 lb

## 2022-01-18 DIAGNOSIS — Z0000 Encounter for general adult medical examination without abnormal findings: Secondary | ICD-10-CM

## 2022-01-18 NOTE — Progress Notes (Signed)
Subjective:   Kenneth Cline is a 66 y.o. male who presents for an Initial Medicare Annual Wellness Visit. Virtual Visit via Telephone Note  I connected with  Kenneth Cline on 01/18/22 at  8:30 AM EDT by telephone and verified that I am speaking with the correct person using two identifiers.  Location: Patient: HOME Provider: [facility] Persons participating in the virtual visit: patient/Nurse Health Advisor   I discussed the limitations, risks, security and privacy concerns of performing an evaluation and management service by telephone and the availability of in person appointments. The patient expressed understanding and agreed to proceed.  Interactive audio and video telecommunications were attempted between this nurse and patient, however failed, due to patient having technical difficulties OR patient did not have access to video capability.  We continued and completed visit with audio only.  Some vital signs may be absent or patient reported.   Kenneth Driver, LPN Review of Systems     Cardiac Risk Factors include: advanced age (>72mn, >>32women)     Objective:    Today's Vitals   01/18/22 0832  Weight: 179 lb (81.2 kg)  Height: 5' 8"  (1.727 m)   Body mass index is 27.22 kg/m.     01/18/2022    8:38 AM 01/13/2021    9:11 AM 08/10/2014    8:11 PM  Advanced Directives  Does Patient Have a Medical Advance Directive? Yes Yes No  Type of [place]aramed[place]of [place] will [place] will   Copy of [place] Chart? No - copy requested No - copy requested     Current Medications (verified) Outpatient Encounter Medications as of 01/18/2022  Medication Sig   aspirin EC 81 MG tablet Take 81 mg by mouth as directed.   Calcium Carb-Cholecalciferol (CALCIUM + VITAMIN D3 PO) Take by mouth as directed.   clonazePAM (KLONOPIN) 0.5 MG tablet Take 0.5 mg by mouth 2 (two) times daily as needed for anxiety.    doxycycline (VIBRA-TABS) 100 MG tablet Take 1 tablet (100 mg total) by mouth 2 (two) times daily.   ezetimibe (ZETIA) 10 MG tablet TAKE ONE TABLET BY MOUTH DAILY   ferrous sulfate 325 (65 FE) MG tablet Take 325 mg by mouth daily with breakfast.   fludrocortisone (FLORINEF) 0.1 MG tablet Take 0.1 mg by mouth daily.   gabapentin (NEURONTIN) 300 MG capsule TAKE 1 CAPSULE BY MOUTH 3 TIMES A DAY MAY INCREASE TO UP TO 6 CAPSULES A DAY   ipratropium (ATROVENT) 0.06 % nasal spray 1-2 SPRAYS IN NOSTRILS 3 (THREE) TIMES DAILY AS NEEDED FOR RHINITIS (RUNNY NOSE/POST NASAL DRIP).   levothyroxine (SYNTHROID) 75 MCG tablet Take 1 tablet (75 mcg total) by mouth daily before breakfast. Alternate taking 75 mcg  ( whole tablet)  with 37.5 mcg ( 1/2 tablet) by mouth every other day   magnesium oxide (MAG-OX) 400 MG tablet Take 400 mg by mouth daily.   metFORMIN (GLUCOPHAGE-XR) 500 MG 24 hr tablet Take 1 tablet by mouth 2 (two) times daily.   metoprolol succinate (TOPROL-XL) 50 MG 24 hr tablet TAKE ONE TABLET BY MOUTH DAILY WITH OR IMMEDIATELY FOLLOWING A MEAL   mometasone (NASONEX) 50 MCG/ACT nasal spray Place 2 sprays into the nose daily as needed.   pantoprazole (PROTONIX) 40 MG tablet TAKE ONE TABLET BY MOUTH TWICE A DAY   predniSONE (DELTASONE) 1 MG tablet Take 841mPO Q AM and 2.78m74mO Q PM  predniSONE (DELTASONE) 20 MG tablet 3 tabs poqday 1-2, 2 tabs poqday 3-4, 1 tab poqday 5-6   Probiotic Product (PROBIOTIC DAILY PO) Take by mouth.   sertraline (ZOLOFT) 50 MG tablet Take 1 tablet (50 mg total) by mouth daily.   traMADol (ULTRAM) 50 MG tablet Take by mouth every 6 (six) hours as needed.   ursodiol (ACTIGALL) 500 MG tablet Take 1 tablet (500 mg total) by mouth 2 (two) times daily.   warfarin (COUMADIN) 5 MG tablet TAKE 1 OR 2 TABLETS BY MOUTH DAILY OR AS DIRECTED BY COUMADIN CLINIC   No facility-administered encounter medications on file as of 01/18/2022.    Allergies (verified) Biaxin [clarithromycin],  Keflex [cephalexin], Macrolides and ketolides, Erythromycin, and Penicillins   History: Past Medical History:  Diagnosis Date   Adrenal hemorrhage [facility]) April 2016   Following shoulder surgery   Adrenal insufficiency, primary, hemorrhagic ([place]) 07/2014   due to bilateral adrenal hemorrhage   Autoimmune hepatitis ([place])    [place]   Cellulitis 09/2018   septic shock, compartment syndrome s/p fasciotomy RLE at [facility]   CVA (cerebral infarction) January 2017   3 small areas of infarct in both the cerebrum as well as the cerebellum -- , located with diplopia   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  One sister with valve disease.   GERD (gastroesophageal reflux disease)    H/O acute cholangitis May 5093   Complicated by septic shock, acute pancreatitis and acute choledocholithiasis;    Lupus anticoagulant with hemorrhagic disorder (HCC)    Previously on Xarelto. Now on warfarin   Overweight (BMI 25.0-29.9)    PSC (primary sclerosing cholangitis)    managed by [facility]. 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 Likely cholangitis   ERCP W/ PLASTIC STENT PLACEMENT  Sep 09 2014   [facility] - Dr. Alvera Novel   ERCP W/ SPHINCTEROTOMY AND BALLOON DILATION  11/10/2014   [facility] -- Removal of biliary stent,, to PACU duodenitis   LAPAROSCOPIC CHOLECYSTECTOMY W/ CHOLANGIOGRAPHY  November 13 2014   [facility], Dr. Warnell Bureau [place]   NM MYOCAR PERF WALL MOTION  06/28/2006   EF 26% , LV systolic fx norm.   ROTATOR CUFF REPAIR     TRANSTHORACIC ECHOCARDIOGRAM  04/2015   No pericardial effusion. No shunt. Full study: Basal septal LVH. Pseudo-normal filling (GR 2 DD). EF 57%.   TRANSTHORACIC ECHOCARDIOGRAM  08/2015   (  [facility]): Normal LV size. Basal septal hypertrophy. EF 57%. Pseudo-normal filling. Mild focal aortic valve thickening. (Study was done to evaluate for possible endocarditis)   Family History  Problem Relation Age of Onset   Heart attack Father 88       Diet cardiac arrest   Sudden death Father 35   Heart disease Sister 1       triscuspid valve insuff,cardioversion   Hypertension Sister        She is currently in her 47s as well   Hypertension Brother    Heart disease Brother 56       CABG; now age 68   Ovarian cancer Maternal Grandmother    Heart attack Brother 61       Died from cardiac arrest   Hypertension Brother    Hypertension Sister    Heart disease Sister 46       CABG plus valve in 26's;    Sudden death Brother    Heart failure Sister        Died from complications of CHF following bowel surgery   Social History   Socioeconomic History   Marital status: Married    Spouse name: Not on file   Number of children: 2   Years of education: Not on file   Highest education level: Not on file  Occupational History    Employer: [employer]  Tobacco Use   Smoking status: Former    Packs/day: 0.50    Types: Cigarettes   Smokeless tobacco: Never  Substance and Sexual Activity    Alcohol use: Not Currently    Alcohol/week: 3.0 standard drinks of alcohol    Types: 3 Standard drinks or equivalent per week    Comment: occassional   Drug use: No   Sexual activity: Not Currently    Birth control/protection: Surgical, None  Other Topics Concern   Not on file  Social History Narrative   He is a married father of 2 with 3 stepchildren.  He has 3 grandchildren his own and 3 step grandchildren.  He works as a Dealer for [employer].  He lives with his wife, [name], of 8 years.  He does not, and never did smoke.  He takes occasional alcohol beverage but nothing significant.  He does not do routine exercise, but does walk a lot at work.   Social Determinants of Health   Financial Resource Strain: Low Risk  (01/18/2022)   Overall Financial Resource Strain (CARDIA)    Difficulty of Paying Living Expenses: Not hard at all  Food Insecurity: No Food Insecurity (01/18/2022)   Hunger Vital Sign    Worried About Running Out of Food in the Last Year: Never true    Ran Out of Food in the Last Year: Never true  Transportation Needs: No Transportation Needs (01/18/2022)   PRAPARE - Hydrologist (Medical): No    Lack of Transportation (Non-Medical): No  Physical Activity: Unknown (01/18/2022)   Exercise Vital Sign    Days of Exercise per Week: 0 days    Minutes of Exercise per Session: Not on file  Stress: No Stress Concern Present (01/18/2022)   [place]    Feeling of Stress : Not at all  Social Connections: [place] (01/18/2022)   Social Connection and Isolation Panel [NHANES]    Frequency of Communication with Friends and Family: More than three times a week    Frequency of Social Gatherings with  Friends and Family: More than three times a week    Attends Religious Services: More than 4 times per year    Active Member of Clubs or Organizations: Yes    Attends Arts administrator: More than 4 times per year    Marital Status: Married    Tobacco Counseling Counseling given: Not Answered   Clinical Intake:  Pre-visit preparation completed: Yes  Pain : No/denies pain     BMI - recorded: 27.22 Nutritional Status: BMI 25 -29 Overweight Nutritional Risks: None Diabetes: No  How often do you need to have someone help you when you read instructions, pamphlets, or other written materials from your doctor or pharmacy?: 1 - Never  Diabetic?NO  Interpreter Needed?: No  Information entered by :: mj Bennie Scaff, lpn   Activities of Daily Living    01/18/2022    8:39 AM 01/14/2022    8:57 AM  In your present state of health, do you have any difficulty performing the following activities:  Hearing? 0 0  Vision? 1 0  Comment glasses. sees Dr. Katy Fitch   Difficulty concentrating or making decisions? 0 0  Walking or climbing stairs? 0 0  Dressing or bathing? 0 0  Doing errands, shopping? 0 0  Preparing Food and eating ? N N  Using the Toilet? N N  In the past six months, have you accidently leaked urine? N N  Do you have problems with loss of bowel control? N N  Managing your Medications? N N  Managing your Finances? N N  Housekeeping or managing your Housekeeping? N N    Patient Care Team: Susy Frizzle, MD as PCP - General (Family Medicine) Leonie Man, MD as PCP - Cardiology (Cardiology)  Indicate any recent Medical Services you may have received from other than [facility] providers in the past year (date may be approximate).     Assessment:   This is a routine wellness examination for [place].  Hearing/Vision screen No results found.  Dietary issues and exercise activities discussed: Current Exercise Habits: The patient does not participate in regular exercise at present, Exercise limited by: cardiac condition(s)   Goals Addressed             This Visit's Progress    DIET - INCREASE WATER INTAKE         Depression Screen     01/18/2022    8:36 AM 01/02/2022    3:22 PM 09/02/2021   10:07 AM 01/13/2021    9:09 AM 01/05/2021   10:51 AM 04/30/2017    9:22 AM 03/12/2017   11:46 AM  PHQ 2/9 Scores  PHQ - 2 Score 0 0 0 0 0 1 0  PHQ- 9 Score      6     Fall Risk    01/18/2022    8:38 AM 01/14/2022    8:57 AM 09/02/2021   10:07 AM 01/13/2021    9:12 AM 01/05/2021   10:52 AM  Fall Risk   Falls in the past year? 0 0 0 0 0  Number falls in past yr: 0  0 0 0  Injury with Fall? 0  0 0 0  Risk for fall due to : No Fall Risks  No Fall Risks No Fall Risks   Follow up Falls prevention discussed  Falls evaluation completed Falls prevention discussed     FALL RISK PREVENTION PERTAINING TO THE HOME:  Any stairs in or around the home? Yes  If so,  are there any without handrails? No  Home free of loose throw rugs in walkways, pet beds, electrical cords, etc? Yes  Adequate lighting in your home to reduce risk of falls? Yes   ASSISTIVE DEVICES UTILIZED TO PREVENT FALLS:  Life alert? No  Use of a cane, walker or w/c? No  Grab bars in the bathroom? No  Shower chair or bench in shower? No  Elevated toilet seat or a handicapped toilet? Yes   TIMED UP AND GO:  Was the test performed? No .  PHONE VISIT  Cognitive Function:        01/18/2022    8:40 AM 01/13/2021    9:16 AM  6CIT Screen  What Year? 0 points 0 points  What month? 0 points 0 points  What time? 0 points 0 points  Count back from 20 0 points 0 points  Months in reverse 2 points 0 points  Repeat phrase 0 points 2 points  Total Score 2 points 2 points    Immunizations Immunization History  Administered Date(s) Administered   DTP / HiB 01/01/2013   Influenza Inj Mdck Quad Pf 12/31/2017, 12/31/2017   Influenza Split 01/10/2017   Influenza Whole 01/01/2013   Influenza, Quadrivalent, Recombinant, Inj, Pf 12/28/2014   Influenza,inj,Quad PF,6+ Mos 01/10/2017, 12/25/2018   Influenza,inj,quad, With Preservative 12/28/2014   Influenza-Unspecified  01/01/2013, 12/28/2014, 01/10/2017, 12/31/2017   PFIZER(Purple Top)SARS-COV-2 Vaccination 06/08/2019, 07/02/2019, 02/23/2020   PNEUMOCOCCAL CONJUGATE-20 09/02/2021   PPD Test 09/02/2015   Pneumococcal Conjugate-13 03/17/2017, 04/05/2017   Td 01/29/2020   Zoster Recombinat (Shingrix) 03/17/2017, 07/16/2017    TDAP status: Up to date  Flu Vaccine status: Up to date  Pneumococcal vaccine status: Up to date  Covid-19 vaccine status: Completed vaccines  Qualifies for Shingles Vaccine? Yes   Zostavax completed Yes   Shingrix Completed?: Yes  Screening Tests Health Maintenance  Topic Date Due   INFLUENZA VACCINE  11/22/2021   COVID-19 Vaccine (4 - Pfizer risk series) 04/23/2022 (Originally 04/19/2020)   COLONOSCOPY (Pts 45-44yr Insurance coverage will need to be confirmed)  10/25/2026   TETANUS/TDAP  01/28/2030   Pneumonia Vaccine 66 Years old  Completed   Hepatitis C Screening  Completed   Zoster Vaccines- Shingrix  Completed   HPV VACCINES  Aged Out    Health Maintenance  Health Maintenance Due  Topic Date Due   INFLUENZA VACCINE  11/22/2021    Colorectal cancer screening: Type of screening: Colonoscopy. Completed 10/24/2016. Repeat every 10 years  Lung Cancer Screening: (Low Dose CT Chest recommended if Age 66-80years, 30 pack-year currently smoking OR have quit w/in 15years.) does qualify.   Lung Cancer Screening Referral: [name]  Additional Screening:  Hepatitis C Screening: does qualify; Completed 08/16/2014  Vision Screening: Recommended annual ophthalmology exams for early detection of glaucoma and other disorders of the eye. Is the patient up to date with their annual eye exam?  Yes  Who is the provider or what is the name of the office in which the patient attends annual eye exams? DR. GKaty FitchIf pt is not established with a provider, would they like to be referred to a provider to establish care? No .   Dental Screening: Recommended annual dental exams for  proper oral hygiene  Community Resource Referral / Chronic Care Management: CRR required this visit?  No   CCM required this visit?  No      Plan:     I have personally reviewed and noted the following in the patient's chart:  Medical and social history Use of alcohol, tobacco or illicit drugs  Current medications and supplements including opioid prescriptions. Patient is not currently taking opioid prescriptions. Functional ability and status Nutritional status Physical activity Advanced directives List of other physicians Hospitalizations, surgeries, and ER visits in previous 12 months Vitals Screenings to include cognitive, depression, and falls Referrals and appointments  In addition, I have reviewed and discussed with patient certain preventive protocols, quality metrics, and best practice recommendations. A written personalized care plan for preventive services as well as general preventive health recommendations were provided to patient.     Kenneth Driver, LPN   6/59/9437   Nurse Notes: Pt is up to date on health maintenance. Postponed flu vaccine at this time.

## 2022-01-18 NOTE — Patient Instructions (Signed)
Kenneth Cline , Thank you for taking time to come for your Medicare Wellness Visit. I appreciate your ongoing commitment to your health goals. Please review the following plan we discussed and let me know if I can assist you in the future.   These are the goals we discussed:  Goals      DIET - INCREASE WATER INTAKE     Exercise 3x per week (30 min per time)     Pt currently works at job where he is lifting and walking. Encouraged pt to try and get in some walking outside of work when he can.        This is a list of the screening recommended for you and due dates:  Health Maintenance  Topic Date Due   Flu Shot  11/22/2021   COVID-19 Vaccine (4 - Pfizer risk series) 04/23/2022*   Colon Cancer Screening  10/25/2026   Tetanus Vaccine  01/28/2030   Pneumonia Vaccine  Completed   Hepatitis C Screening: USPSTF Recommendation to screen - Ages 17-79 yo.  Completed   Zoster (Shingles) Vaccine  Completed   HPV Vaccine  Aged Out  *Topic was postponed. The date shown is not the original due date.    Advanced directives: Please bring a copy of your health care power of attorney and living will to the office to be added to your chart at your convenience.   Conditions/risks identified: Aim for 30 minutes of exercise or brisk walking, 6-8 glasses of water, and 5 servings of fruits and vegetables each day.   Next appointment: Follow up in one year for your annual wellness visit. 12/2022  Preventive Care 65 Years and Older, Male  Preventive care refers to lifestyle choices and visits with your health care provider that can promote health and wellness. What does preventive care include? A yearly physical exam. This is also called an annual well check. Dental exams once or twice a year. Routine eye exams. Ask your health care provider how often you should have your eyes checked. Personal lifestyle choices, including: Daily care of your teeth and gums. Regular physical activity. Eating a healthy  diet. Avoiding tobacco and drug use. Limiting alcohol use. Practicing safe sex. Taking low doses of aspirin every day. Taking vitamin and mineral supplements as recommended by your health care provider. What happens during an annual well check? The services and screenings done by your health care provider during your annual well check will depend on your age, overall health, lifestyle risk factors, and family history of disease. Counseling  Your health care provider may ask you questions about your: Alcohol use. Tobacco use. Drug use. Emotional well-being. Home and relationship well-being. Sexual activity. Eating habits. History of falls. Memory and ability to understand (cognition). Work and work Statistician. Screening  You may have the following tests or measurements: Height, weight, and BMI. Blood pressure. Lipid and cholesterol levels. These may be checked every 5 years, or more frequently if you are over 39 years old. Skin check. Lung cancer screening. You may have this screening every year starting at age 50 if you have a 30-pack-year history of smoking and currently smoke or have quit within the past 15 years. Fecal occult blood test (FOBT) of the stool. You may have this test every year starting at age 70. Flexible sigmoidoscopy or colonoscopy. You may have a sigmoidoscopy every 5 years or a colonoscopy every 10 years starting at age 68. Prostate cancer screening. Recommendations will vary depending on your family history  and other risks. Hepatitis C blood test. Hepatitis B blood test. Sexually transmitted disease (STD) testing. Diabetes screening. This is done by checking your blood sugar (glucose) after you have not eaten for a while (fasting). You may have this done every 1-3 years. Abdominal aortic aneurysm (AAA) screening. You may need this if you are a current or former smoker. Osteoporosis. You may be screened starting at age 48 if you are at high risk. Talk with  your health care provider about your test results, treatment options, and if necessary, the need for more tests. Vaccines  Your health care provider may recommend certain vaccines, such as: Influenza vaccine. This is recommended every year. Tetanus, diphtheria, and acellular pertussis (Tdap, Td) vaccine. You may need a Td booster every 10 years. Zoster vaccine. You may need this after age 28. Pneumococcal 13-valent conjugate (PCV13) vaccine. One dose is recommended after age 67. Pneumococcal polysaccharide (PPSV23) vaccine. One dose is recommended after age 82. Talk to your health care provider about which screenings and vaccines you need and how often you need them. This information is not intended to replace advice given to you by your health care provider. Make sure you discuss any questions you have with your health care provider. Document Released: 05/07/2015 Document Revised: 12/29/2015 Document Reviewed: 02/09/2015 Elsevier Interactive Patient Education  2017 [place] Prevention in the Home Falls can cause injuries. They can happen to people of all ages. There are many things you can do to make your home safe and to help prevent falls. What can I do on the outside of my home? Regularly fix the edges of walkways and driveways and fix any cracks. Remove anything that might make you trip as you walk through a door, such as a raised step or threshold. Trim any bushes or trees on the path to your home. Use bright outdoor lighting. Clear any walking paths of anything that might make someone trip, such as rocks or tools. Regularly check to see if handrails are loose or broken. Make sure that both sides of any steps have handrails. Any raised decks and porches should have guardrails on the edges. Have any leaves, snow, or ice cleared regularly. Use sand or salt on walking paths during winter. Clean up any spills in your garage right away. This includes oil or grease spills. What  can I do in the bathroom? Use night lights. Install grab bars by the toilet and in the tub and shower. Do not use towel bars as grab bars. Use non-skid mats or decals in the tub or shower. If you need to sit down in the shower, use a plastic, non-slip stool. Keep the floor dry. Clean up any water that spills on the floor as soon as it happens. Remove soap buildup in the tub or shower regularly. Attach bath mats securely with double-sided non-slip rug tape. Do not have throw rugs and other things on the floor that can make you trip. What can I do in the bedroom? Use night lights. Make sure that you have a light by your bed that is easy to reach. Do not use any sheets or blankets that are too big for your bed. They should not hang down onto the floor. Have a firm chair that has side arms. You can use this for support while you get dressed. Do not have throw rugs and other things on the floor that can make you trip. What can I do in the kitchen? Clean up any spills  right away. Avoid walking on wet floors. Keep items that you use a lot in easy-to-reach places. If you need to reach something above you, use a strong step stool that has a grab bar. Keep electrical cords out of the way. Do not use floor polish or wax that makes floors slippery. If you must use wax, use non-skid floor wax. Do not have throw rugs and other things on the floor that can make you trip. What can I do with my stairs? Do not leave any items on the stairs. Make sure that there are handrails on both sides of the stairs and use them. Fix handrails that are broken or loose. Make sure that handrails are as long as the stairways. Check any carpeting to make sure that it is firmly attached to the stairs. Fix any carpet that is loose or worn. Avoid having throw rugs at the top or bottom of the stairs. If you do have throw rugs, attach them to the floor with carpet tape. Make sure that you have a light switch at the top of the  stairs and the bottom of the stairs. If you do not have them, ask someone to add them for you. What else can I do to help prevent falls? Wear shoes that: Do not have high heels. Have rubber bottoms. Are comfortable and fit you well. Are closed at the toe. Do not wear sandals. If you use a stepladder: Make sure that it is fully opened. Do not climb a closed stepladder. Make sure that both sides of the stepladder are locked into place. Ask someone to hold it for you, if possible. Clearly mark and make sure that you can see: Any grab bars or handrails. First and last steps. Where the edge of each step is. Use tools that help you move around (mobility aids) if they are needed. These include: Canes. Walkers. Scooters. Crutches. Turn on the lights when you go into a dark area. Replace any light bulbs as soon as they burn out. Set up your furniture so you have a clear path. Avoid moving your furniture around. If any of your floors are uneven, fix them. If there are any pets around you, be aware of where they are. Review your medicines with your doctor. Some medicines can make you feel dizzy. This can increase your chance of falling. Ask your doctor what other things that you can do to help prevent falls. This information is not intended to replace advice given to you by your health care provider. Make sure you discuss any questions you have with your health care provider. Document Released: 02/04/2009 Document Revised: 09/16/2015 Document Reviewed: 05/15/2014 Elsevier Interactive Patient Education  2017 [employer].

## 2022-01-19 ENCOUNTER — Ambulatory Visit: Payer: Self-pay

## 2022-01-31 ENCOUNTER — Ambulatory Visit: Payer: Medicare Other

## 2022-01-31 ENCOUNTER — Ambulatory Visit (INDEPENDENT_AMBULATORY_CARE_PROVIDER_SITE_OTHER): Payer: Medicare Other | Admitting: *Deleted

## 2022-01-31 ENCOUNTER — Ambulatory Visit (INDEPENDENT_AMBULATORY_CARE_PROVIDER_SITE_OTHER): Payer: Medicare Other | Admitting: Family Medicine

## 2022-01-31 VITALS — BP 110/58 | HR 81 | Temp 97.9°F | Wt 176.0 lb

## 2022-01-31 DIAGNOSIS — E271 Primary adrenocortical insufficiency: Secondary | ICD-10-CM

## 2022-01-31 DIAGNOSIS — Z7901 Long term (current) use of anticoagulants: Secondary | ICD-10-CM

## 2022-01-31 DIAGNOSIS — I823 Embolism and thrombosis of renal vein: Secondary | ICD-10-CM

## 2022-01-31 DIAGNOSIS — J0110 Acute frontal sinusitis, unspecified: Secondary | ICD-10-CM

## 2022-01-31 LAB — PT WITH INR/FINGERSTICK
INR, fingerstick: 2.9 ratio — ABNORMAL HIGH
PT, fingerstick: 34.9 s — ABNORMAL HIGH (ref 10.5–13.1)

## 2022-01-31 MED ORDER — PREDNISONE 5 MG PO TABS: 5.0000 mg | ORAL_TABLET | Freq: Two times a day (BID) | ORAL | 0 refills | Status: AC

## 2022-01-31 MED ORDER — LEVOFLOXACIN 500 MG PO TABS: 500.0000 mg | ORAL_TABLET | Freq: Every day | ORAL | 0 refills | Status: AC

## 2022-01-31 NOTE — Progress Notes (Signed)
Subjective:    Patient ID: Kenneth Cline, male    DOB: [birth year], 66 y.o.   MRN: [id number]  HPI 01/02/22 He has a very complicated past medical history. He has a history of lupus anticoagulant. 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  He is on chronic fludrocortisone as well as prednisone for this. He has a history of type 2 diabetes as well as hypothyroidism which is treated by endocrinology. He also has a history of acute cholangitis requiring ERCP and bile duct stenting.  This is due to primary sclerosing cholangitis.  Patient is on chronic prednisone for this reason.  For the last week he has been dealing with a constant cough.  He states that he hears himself wheezing at night when he is lying down.  Today on examination he has rhonchorous breath sounds bilaterally and faint expiratory wheezing.  He denies any chest pain or shortness of breath.  He denies any fevers or chills.  He denies any hemoptysis or purulent sputum.  The most part is yellow and clear.  He denies any sinus pain.  He denies any headaches.  He denies any otalgia.  At that time, my plan was: I believe the patient has a viral bronchitis.  We will put the patient temporarily on a prednisone taper pack due to the wheezing.  I recommended tincture of time.  If he develops purulent sputum or high fever I did give him doxycycline for bacterial bronchitis however he would need to come in after 4 days to recheck an INR after starting the doxycycline.  Patient will take the prednisone and simply monitor the situation for the next few days due to the wheezing and hold off on taking the doxycycline unless symptoms worsen dramatically.   01/31/22 Patient has been battling head congestion for over a month.  He reports rhinorrhea and head congestion on a daily basis.  He has been using a Nettie pot and getting purulent drainage and  debris out.  Starting this week he has developed fevers, rigors, and chills.  He also has a cough that is nonproductive.  He denies any pleurisy or shortness of breath or chest pain.  His lungs are clear to auscultation bilaterally today.  He does have stopped up ears bilaterally.  He has a history of adrenal insufficiency due to chronic steroid use.  He took a stress dose of prednisone this morning.  He typically takes 7-1/2 mg daily.  He is taken 15 today and he is feeling better.  He denies any nausea or vomiting.  He denies any dysuria.  He denies any hematuria.  He denies any diarrhea or melena or hematochezia.  He does have some mild left lower quadrant tenderness.  He has no right upper quadrant tenderness as he has a history of ascending cholangitis.  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  One sister with valve disease.   GERD (gastroesophageal reflux disease)    H/O acute cholangitis May 7741   Complicated by septic shock, acute pancreatitis and acute choledocholithiasis;    Lupus anticoagulant with hemorrhagic disorder (HCC)    Previously on Xarelto. Now on warfarin   Overweight (BMI 25.0-29.9)    PSC (primary sclerosing cholangitis)    managed by Dr. 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) February 2017   Past Surgical History:  Procedure Laterality Date   CPET/MET  11/27/2011   normal PFT, good effort-no ischemia burden   ERCP W/ METAL STENT PLACEMENT  10/'16; 11/'16   [facility] - stent removal   ERCP W/ METAL STENT PLACEMENT  06/01/2015   [place]. Likely cholangitis   ERCP W/ PLASTIC STENT PLACEMENT  Sep 09 2014   [facility] - Dr. Alvera Novel   ERCP W/ SPHINCTEROTOMY AND BALLOON DILATION  11/10/2014   [facility] -- Removal of biliary stent,, to PACU duodenitis   LAPAROSCOPIC CHOLECYSTECTOMY W/ CHOLANGIOGRAPHY  November 13 2014   [facility], Dr. Warnell Bureau [place]   NM MYOCAR PERF WALL MOTION  06/28/2006   EF 28% , LV systolic fx norm.   ROTATOR CUFF REPAIR     TRANSTHORACIC ECHOCARDIOGRAM  04/2015   No pericardial effusion. No shunt. Full study: Basal septal LVH. Pseudo-normal filling (GR 2 DD). EF 57%.   TRANSTHORACIC ECHOCARDIOGRAM  08/2015   [facility]): Normal LV size. Basal septal hypertrophy. EF 57%. Pseudo-normal filling. Mild focal aortic valve thickening. (Study was done to evaluate for possible endocarditis)   Current Outpatient Medications on File Prior to Visit  Medication Sig Dispense Refill   aspirin EC 81 MG tablet Take 81 mg by mouth as directed.     Calcium Carb-Cholecalciferol (CALCIUM + VITAMIN D3 PO) Take by mouth as directed.     clonazePAM (KLONOPIN) 0.5 MG tablet Take 0.5 mg by mouth 2 (two) times daily as needed for  anxiety.     doxycycline (VIBRA-TABS) 100 MG tablet Take 1 tablet (100 mg total) by mouth 2 (two) times daily. 14 tablet 0   ezetimibe (ZETIA) 10 MG tablet TAKE ONE TABLET BY MOUTH DAILY 90 tablet 2   ferrous sulfate 325 (65 FE) MG tablet Take 325 mg by mouth daily with breakfast.     fludrocortisone (FLORINEF) 0.1 MG tablet Take 0.1 mg by mouth daily.     gabapentin (NEURONTIN) 300 MG capsule TAKE 1 CAPSULE BY MOUTH 3 TIMES A DAY MAY INCREASE TO UP TO 6 CAPSULES A DAY 180 capsule 4   ipratropium (ATROVENT) 0.06 % nasal spray 1-2 SPRAYS IN NOSTRILS 3 (THREE) TIMES DAILY AS NEEDED FOR RHINITIS (RUNNY NOSE/POST NASAL DRIP). 45 mL 3  levothyroxine (SYNTHROID) 75 MCG tablet Take 1 tablet (75 mcg total) by mouth daily before breakfast. Alternate taking 75 mcg  ( whole tablet)  with 37.5 mcg ( 1/2 tablet) by mouth every other day 90 tablet 3   magnesium oxide (MAG-OX) 400 MG tablet Take 400 mg by mouth daily.     metFORMIN (GLUCOPHAGE-XR) 500 MG 24 hr tablet Take 1 tablet by mouth 2 (two) times daily.     metoprolol succinate (TOPROL-XL) 50 MG 24 hr tablet TAKE ONE TABLET BY MOUTH DAILY WITH OR IMMEDIATELY FOLLOWING A MEAL 30 tablet 0   mometasone (NASONEX) 50 MCG/ACT nasal spray Place 2 sprays into the nose daily as needed.     pantoprazole (PROTONIX) 40 MG tablet TAKE ONE TABLET BY MOUTH TWICE A DAY 180 tablet 1   predniSONE (DELTASONE) 1 MG tablet Take 65m PO Q AM and 2.575mPO Q PM 300 tablet 3   predniSONE (DELTASONE) 20 MG tablet 3 tabs poqday 1-2, 2 tabs poqday 3-4, 1 tab poqday 5-6 12 tablet 0   Probiotic Product (PROBIOTIC DAILY PO) Take by mouth.     sertraline (ZOLOFT) 50 MG tablet Take 1 tablet (50 mg total) by mouth daily. 30 tablet 3   traMADol (ULTRAM) 50 MG tablet Take by mouth every 6 (six) hours as needed.     ursodiol (ACTIGALL) 500 MG tablet Take 1 tablet (500 mg total) by mouth 2 (two) times daily. 180 tablet 3   warfarin (COUMADIN) 5 MG tablet TAKE 1 OR 2 TABLETS BY MOUTH DAILY OR  AS DIRECTED BY COUMADIN CLINIC 135 tablet 1   No current facility-administered medications on file prior to visit.   Allergies  Allergen Reactions   Biaxin [Clarithromycin] Rash    SJS   Keflex [Cephalexin] Hives, Itching and Rash   Macrolides And Ketolides Other (See Comments)    Unknown   Erythromycin     Was told not to take d/t medical problems   Penicillins     REACTION: rash   Social History   Socioeconomic History   Marital status: Married    Spouse name: Not on file   Number of children: 2   Years of education: Not on file   Highest education level: Not on file  Occupational History    Employer: [employer]  Tobacco Use   Smoking status: Former    Packs/day: 0.50    Types: Cigarettes   Smokeless tobacco: Never  Substance and Sexual Activity   Alcohol use: Not Currently    Alcohol/week: 3.0 standard drinks of alcohol    Types: 3 Standard drinks or equivalent per week    Comment: occassional   Drug use: No   Sexual activity: Not Currently    Birth control/protection: Surgical, None  Other Topics Concern   Not on file  Social History Narrative   He is a married father of 2 with 3 stepchildren.  He has 3 grandchildren his own and 3 step grandchildren.  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  He does not, and never did smoke.  He takes occasional alcohol beverage but nothing significant.  He does not do routine exercise, but does walk a lot at work.   Social Determinants of Health   Financial Resource Strain: Low Risk  (01/18/2022)   Overall Financial Resource Strain (CARDIA)    Difficulty of Paying Living Expenses: Not hard at all  Food Insecurity: No Food Insecurity (01/18/2022)  Hunger Vital Sign    Worried About Running Out of Food in the Last Year: Never true    Ran Out of Food in the Last Year: Never true  Transportation Needs: No Transportation Needs (01/18/2022)   PRAPARE - Hydrologist  (Medical): No    Lack of Transportation (Non-Medical): No  Physical Activity: Unknown (01/18/2022)   Exercise Vital Sign    Days of Exercise per Week: 0 days    Minutes of Exercise per Session: Not on file  Stress: No Stress Concern Present (01/18/2022)   [place]    Feeling of Stress : Not at all  Social Connections: [place] (01/18/2022)   Social Connection and Isolation Panel [NHANES]    Frequency of Communication with Friends and Family: More than three times a week    Frequency of Social Gatherings with Friends and Family: More than three times a week    Attends Religious Services: More than 4 times per year    Active Member of [employer] or Organizations: Yes    Attends Music therapist: More than 4 times per year    Marital Status: Married  Human resources officer Violence: Not At Risk (01/18/2022)   Humiliation, Afraid, Rape, and Kick questionnaire    Fear of Current or Ex-Partner: No    Emotionally Abused: No    Physically Abused: No    Sexually Abused: No     Review of Systems     Objective:   Physical Exam Constitutional:      General: He is not in acute distress.    Appearance: He is not diaphoretic.  HENT:     Head: Normocephalic and atraumatic.     Right Ear: Tympanic membrane, ear canal and external ear normal.     Left Ear: Tympanic membrane, ear canal and external ear normal.     Nose: Congestion and rhinorrhea present.     Right Sinus: No maxillary sinus tenderness or frontal sinus tenderness.     Left Sinus: No maxillary sinus tenderness or frontal sinus tenderness.  Neck:     Thyroid: No thyromegaly.     Vascular: No JVD.     Trachea: No tracheal deviation.  Cardiovascular:     Rate and Rhythm: Regular rhythm.     Heart sounds: No murmur heard.    No friction rub. No gallop.  Pulmonary:     Effort: No respiratory distress.     Breath sounds: No stridor. Wheezing and  rhonchi present. No rales.  Chest:     Chest wall: No tenderness.  Abdominal:     General: Abdomen is flat. Bowel sounds are normal. There is no distension.     Tenderness: There is abdominal tenderness in the left lower quadrant.  Musculoskeletal:     Cervical back: Normal range of motion and neck supple.  Lymphadenopathy:     Cervical: No cervical adenopathy.  Skin:    General: Skin is warm.     Coloration: Skin is not pale.     Findings: No erythema or rash.  Neurological:     Mental Status: He is alert and oriented to person, place, and time.     Cranial Nerves: No cranial nerve deficit.     Motor: No abnormal muscle tone.     Coordination: Coordination normal.     Deep Tendon Reflexes: Reflexes are normal and symmetric. Reflexes normal.  Assessment & Plan:  Renal vein thrombosis (HCC) - Plan: PT with INR/Fingerstick  Acute frontal sinusitis, recurrence not specified  Adrenal insufficiency (Addison's disease) ([place]) I believe the patient likely is dealing with a sinus infection based on his history.  Due to his allergy profile to start him on Levaquin 500 mg daily for 7 days.  I recommended that they increase his prednisone to the stress dose of 20 mg a day and I gave him extra prednisone 5 mg pills to do that.  He will take 3 pills in the morning and 1 pill in the evening.  Once he is off antibiotic, they will reduce back to their previous regimen which is 1-1/2 pills in the morning and half pill in the evening.  I would like to recheck an INR in 4 days as I am aware the Levaquin will likely affect his INR and will adjust his Coumadin dose in 4 days to compensate for that.  Monitor for worsening abdominal pain which could be a sign of diverticulitis however today I believe is more likely related to generally feeling sick as the abdominal pain is just mild and vague

## 2022-02-03 ENCOUNTER — Ambulatory Visit (INDEPENDENT_AMBULATORY_CARE_PROVIDER_SITE_OTHER): Payer: Medicare Other | Admitting: Family Medicine

## 2022-02-03 ENCOUNTER — Telehealth: Payer: Self-pay

## 2022-02-03 ENCOUNTER — Ambulatory Visit (INDEPENDENT_AMBULATORY_CARE_PROVIDER_SITE_OTHER): Payer: Medicare Other | Admitting: *Deleted

## 2022-02-03 ENCOUNTER — Other Ambulatory Visit: Payer: Self-pay

## 2022-02-03 VITALS — BP 118/70 | HR 80 | Wt 175.6 lb

## 2022-02-03 DIAGNOSIS — I823 Embolism and thrombosis of renal vein: Secondary | ICD-10-CM | POA: Diagnosis not present

## 2022-02-03 DIAGNOSIS — Z8673 Personal history of transient ischemic attack (TIA), and cerebral infarction without residual deficits: Secondary | ICD-10-CM | POA: Diagnosis not present

## 2022-02-03 DIAGNOSIS — Z7901 Long term (current) use of anticoagulants: Secondary | ICD-10-CM | POA: Diagnosis not present

## 2022-02-03 LAB — PT WITH INR/FINGERSTICK
INR, fingerstick: 2.4 ratio — ABNORMAL HIGH
PT, fingerstick: 28.6 s — ABNORMAL HIGH (ref 10.5–13.1)

## 2022-02-03 NOTE — Patient Instructions (Signed)
Description   Spoke with pt and instructed pt to continue taking 1 tablet daily except 1.5 tablets on Monday and Friday. Recheck INR on 4 weeks in the office-finished antibiotics (normally 8 weeks & plan to resume). [phone]

## 2022-02-03 NOTE — Telephone Encounter (Signed)
Lpm to see if he went for PCP appt.

## 2022-02-03 NOTE — Progress Notes (Signed)
Subjective:    Patient ID: Kenneth Cline, male    DOB: 1956/03/12, 66 y.o.   MRN: [id number]  HPI 01/02/22 He has a very complicated past medical history. He has a history of lupus anticoagulant. 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  He is on chronic fludrocortisone as well as prednisone for this. He has a history of type 2 diabetes as well as hypothyroidism which is treated by endocrinology. He also has a history of acute cholangitis requiring ERCP and bile duct stenting.  This is due to primary sclerosing cholangitis.  Patient is on chronic prednisone for this reason.  For the last week he has been dealing with a constant cough.  He states that he hears himself wheezing at night when he is lying down.  Today on examination he has rhonchorous breath sounds bilaterally and faint expiratory wheezing.  He denies any chest pain or shortness of breath.  He denies any fevers or chills.  He denies any hemoptysis or purulent sputum.  The most part is yellow and clear.  He denies any sinus pain.  He denies any headaches.  He denies any otalgia.  At that time, my plan was: I believe the patient has a viral bronchitis.  We will put the patient temporarily on a prednisone taper pack due to the wheezing.  I recommended tincture of time.  If he develops purulent sputum or high fever I did give him doxycycline for bacterial bronchitis however he would need to come in after 4 days to recheck an INR after starting the doxycycline.  Patient will take the prednisone and simply monitor the situation for the next few days due to the wheezing and hold off on taking the doxycycline unless symptoms worsen dramatically.   01/31/22 Patient has been battling head congestion for over a month.  He reports rhinorrhea and head congestion on a daily basis.  He has been using a Nettie pot and getting purulent drainage and  debris out.  Starting this week he has developed fevers, rigors, and chills.  He also has a cough that is nonproductive.  He denies any pleurisy or shortness of breath or chest pain.  His lungs are clear to auscultation bilaterally today.  He does have stopped up ears bilaterally.  He has a history of adrenal insufficiency due to chronic steroid use.  He took a stress dose of prednisone this morning.  He typically takes 7-1/2 mg daily.  He is taken 15 today and he is feeling better.  He denies any nausea or vomiting.  He denies any dysuria.  He denies any hematuria.  He denies any diarrhea or melena or hematochezia.  He does have some mild left lower quadrant tenderness.  He has no right upper quadrant tenderness as he has a history of ascending cholangitis.  INR is therapeutic at 2.9.  At that time, my plan was:  I believe the patient likely is dealing with a sinus infection based on his history.  Due to his allergy profile to start him on Levaquin 500 mg daily for 7 days.  I recommended that they increase his prednisone to the stress dose of 20 mg a day and I gave him extra prednisone 5 mg pills to do that.  He will take 3 pills in the morning and 1 pill in the evening.  Once he is off antibiotic, they will reduce back to their  previous regimen which is 1-1/2 pills in the morning and half pill in the evening.  I would like to recheck an INR in 4 days as I am aware the Levaquin will likely affect his INR and will adjust his Coumadin dose in 4 days to compensate for that.  Monitor for worsening abdominal pain which could be a sign of diverticulitis however today I believe is more likely related to generally feeling sick as the abdominal pain is just mild and vague  02/03/22 Patient states that he is feeling much better.  The sinus congestion is improving.  The head congestion is improving.  He denies any further rigors.  Fortunately he denies any further abdominal pain.  I checked does not today.  INR is 2.4 so  despite taking Levaquin, there has not been a drastic change in his INR.  He is currently taking 7.5 mg of Coumadin on Monday and Friday and 5 mg all other days   Past Medical History:  Diagnosis Date   Adrenal hemorrhage [facility]) April 2016   Following shoulder surgery   Adrenal insufficiency, primary, hemorrhagic ([place]) 07/2014   due to bilateral adrenal hemorrhage   Autoimmune hepatitis ([place])    [place]   Cellulitis 09/2018   septic shock, compartment syndrome s/p fasciotomy [facility]   CVA (cerebral infarction) January 2017   3 small areas of infarct in both the cerebrum as well as the cerebellum -- , located with diplopia   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  One sister with valve disease.   GERD (gastroesophageal reflux disease)    H/O acute cholangitis May 9371   Complicated by septic shock, acute pancreatitis and acute choledocholithiasis;    Lupus anticoagulant with hemorrhagic disorder (HCC)    Previously on Xarelto. Now on warfarin   Overweight (BMI 25.0-29.9)    PSC (primary sclerosing cholangitis)    managed by Dr. 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 Likely cholangitis   ERCP W/ PLASTIC STENT PLACEMENT  Sep 09 2014   [facility] - Dr. Alvera Novel   ERCP W/ SPHINCTEROTOMY AND BALLOON DILATION  11/10/2014   [facility] -- Removal of biliary stent,, to PACU duodenitis   LAPAROSCOPIC CHOLECYSTECTOMY W/ CHOLANGIOGRAPHY  November 13 2014   [facility], Dr. Warnell Bureau [place]   NM MYOCAR PERF WALL MOTION  06/28/2006   EF 69% , LV systolic fx norm.  ROTATOR CUFF REPAIR     TRANSTHORACIC ECHOCARDIOGRAM  04/2015   No pericardial effusion. No shunt. Full study: Basal septal LVH. Pseudo-normal filling (GR 2 DD). EF 57%.   TRANSTHORACIC ECHOCARDIOGRAM  08/2015   [facility]): Normal LV size. Basal septal hypertrophy. EF 57%. Pseudo-normal filling. Mild focal aortic valve thickening. (Study was done to evaluate for possible endocarditis)   Current Outpatient Medications on File Prior to Visit  Medication Sig Dispense Refill   aspirin EC 81 MG tablet Take 81 mg by mouth as directed.     Calcium Carb-Cholecalciferol (CALCIUM + VITAMIN D3 PO) Take by mouth as directed.     clonazePAM (KLONOPIN) 0.5 MG tablet Take 0.5 mg by mouth 2 (two) times daily as needed for anxiety.     doxycycline (VIBRA-TABS) 100 MG tablet Take 1 tablet (100 mg total) by mouth 2 (two) times daily. 14 tablet 0   ezetimibe (ZETIA) 10 MG tablet TAKE ONE TABLET BY MOUTH DAILY 90 tablet 2   ferrous sulfate 325 (65 FE) MG tablet Take 325 mg by mouth daily with breakfast.     fludrocortisone (FLORINEF) 0.1 MG tablet Take 0.1 mg by mouth daily.     gabapentin (NEURONTIN) 300 MG capsule TAKE 1 CAPSULE BY MOUTH 3 TIMES A DAY MAY INCREASE TO UP TO 6 CAPSULES A DAY 180 capsule 4   ipratropium (ATROVENT) 0.06 % nasal spray 1-2 SPRAYS IN NOSTRILS 3 (THREE) TIMES DAILY AS NEEDED FOR RHINITIS (RUNNY NOSE/POST NASAL DRIP). 45 mL 3    levofloxacin (LEVAQUIN) 500 MG tablet Take 1 tablet (500 mg total) by mouth daily for 7 days. 7 tablet 0   levothyroxine (SYNTHROID) 75 MCG tablet Take 1 tablet (75 mcg total) by mouth daily before breakfast. Alternate taking 75 mcg  ( whole tablet)  with 37.5 mcg ( 1/2 tablet) by mouth every other day 90 tablet 3   magnesium oxide (MAG-OX) 400 MG tablet Take 400 mg by mouth daily.     metFORMIN (GLUCOPHAGE-XR) 500 MG 24 hr tablet Take 1 tablet by mouth 2 (two) times daily.     metoprolol succinate (TOPROL-XL) 50 MG 24 hr tablet TAKE ONE TABLET BY MOUTH DAILY WITH OR IMMEDIATELY FOLLOWING A MEAL 30 tablet 0   mometasone (NASONEX) 50 MCG/ACT nasal spray Place 2 sprays into the nose daily as needed.     pantoprazole (PROTONIX) 40 MG tablet TAKE ONE TABLET BY MOUTH TWICE A DAY 180 tablet 1   predniSONE (DELTASONE) 1 MG tablet Take 89m PO Q AM and 2.534mPO Q PM 300 tablet 3   predniSONE (DELTASONE) 20 MG tablet 3 tabs poqday 1-2, 2 tabs poqday 3-4, 1 tab poqday 5-6 12 tablet 0   predniSONE (DELTASONE) 5 MG tablet Take 1 tablet (5 mg total) by mouth in the morning and at bedtime. 60 tablet 0   Probiotic Product (PROBIOTIC DAILY PO) Take by mouth.     sertraline (ZOLOFT) 50 MG tablet Take 1 tablet (50 mg total) by mouth daily. 30 tablet 3   traMADol (ULTRAM) 50 MG tablet Take by mouth every 6 (six) hours as needed.     ursodiol (ACTIGALL) 500 MG tablet Take 1 tablet (500 mg total) by mouth 2 (two) times daily. 180 tablet 3   warfarin (COUMADIN) 5 MG tablet TAKE 1 OR 2 TABLETS BY MOUTH DAILY OR AS DIRECTED BY COUMADIN CLINIC 135 tablet 1   No current facility-administered medications on file prior to visit.   Allergies  Allergen Reactions  Biaxin [Clarithromycin] Rash    SJS   Keflex [Cephalexin] Hives, Itching and Rash   Macrolides And Ketolides Other (See Comments)    Unknown   Erythromycin     Was told not to take d/t medical problems   Penicillins     REACTION: rash   Social History    Socioeconomic History   Marital status: Married    Spouse name: Not on file   Number of children: 2   Years of education: Not on file   Highest education level: Not on file  Occupational History    Employer: [employer]  Tobacco Use   Smoking status: Former    Packs/day: 0.50    Types: Cigarettes   Smokeless tobacco: Never  Substance and Sexual Activity   Alcohol use: Not Currently    Alcohol/week: 3.0 standard drinks of alcohol    Types: 3 Standard drinks or equivalent per week    Comment: occassional   Drug use: No   Sexual activity: Not Currently    Birth control/protection: Surgical, None  Other Topics Concern   Not on file  Social History Narrative   He is a married father of 2 with 3 stepchildren.  He has 3 grandchildren his own and 3 step grandchildren.  He works as a Dealer for [employer].  He lives with his wife, [name], of 8 years.  He does not, and never did smoke.  He takes occasional alcohol beverage but nothing significant.  He does not do routine exercise, but does walk a lot at work.   Social Determinants of Health   Financial Resource Strain: Low Risk  (01/18/2022)   Overall Financial Resource Strain (CARDIA)    Difficulty of Paying Living Expenses: Not hard at all  Food Insecurity: No Food Insecurity (01/18/2022)   Hunger Vital Sign    Worried About Running Out of Food in the Last Year: Never true    Ran Out of Food in the Last Year: Never true  Transportation Needs: No Transportation Needs (01/18/2022)   PRAPARE - Hydrologist (Medical): No    Lack of Transportation (Non-Medical): No  Physical Activity: Unknown (01/18/2022)   Exercise Vital Sign    Days of Exercise per Week: 0 days    Minutes of Exercise per Session: Not on file  Stress: No Stress Concern Present (01/18/2022)   [place]    Feeling of Stress : Not at all  Social Connections: [place] (01/18/2022)    Social Connection and Isolation Panel [NHANES]    Frequency of Communication with Friends and Family: More than three times a week    Frequency of Social Gatherings with Friends and Family: More than three times a week    Attends Religious Services: More than 4 times per year    Active Member of [employer] or Organizations: Yes    Attends Music therapist: More than 4 times per year    Marital Status: Married  Human resources officer Violence: Not At Risk (01/18/2022)   Humiliation, Afraid, Rape, and Kick questionnaire    Fear of Current or Ex-Partner: No    Emotionally Abused: No    Physically Abused: No    Sexually Abused: No     Review of Systems     Objective:   Physical Exam Constitutional:      General: He is not in acute distress.    Appearance: He is not diaphoretic.  HENT:  Head: Normocephalic and atraumatic.     Right Ear: Tympanic membrane, ear canal and external ear normal.     Left Ear: Tympanic membrane, ear canal and external ear normal.     Nose: Congestion and rhinorrhea present.     Right Sinus: No maxillary sinus tenderness or frontal sinus tenderness.     Left Sinus: No maxillary sinus tenderness or frontal sinus tenderness.  Neck:     Thyroid: No thyromegaly.     Vascular: No JVD.     Trachea: No tracheal deviation.  Cardiovascular:     Rate and Rhythm: Regular rhythm.     Heart sounds: No murmur heard.    No friction rub. No gallop.  Pulmonary:     Effort: No respiratory distress.     Breath sounds: No stridor. Wheezing and rhonchi present. No rales.  Chest:     Chest wall: No tenderness.  Abdominal:     General: Abdomen is flat. Bowel sounds are normal. There is no distension.     Tenderness: There is abdominal tenderness in the left lower quadrant.  Musculoskeletal:     Cervical back: Normal range of motion and neck supple.  Lymphadenopathy:     Cervical: No cervical adenopathy.  Skin:    General: Skin is warm.     Coloration: Skin is  not pale.     Findings: No erythema or rash.  Neurological:     Mental Status: He is alert and oriented to person, place, and time.     Cranial Nerves: No cranial nerve deficit.     Motor: No abnormal muscle tone.     Coordination: Coordination normal.     Deep Tendon Reflexes: Reflexes are normal and symmetric. Reflexes normal.          Assessment & Plan:  History of CVA (cerebrovascular accident) - Plan: PT with INR/Fingerstick  INR is therapeutic.  Continue current dose of Coumadin and complete 3 additional days of Levaquin.  Recheck next week if not completely better or sooner if worsening.

## 2022-02-19 ENCOUNTER — Other Ambulatory Visit: Payer: Self-pay | Admitting: Cardiology

## 2022-02-21 ENCOUNTER — Other Ambulatory Visit: Payer: Self-pay | Admitting: Family Medicine

## 2022-02-21 ENCOUNTER — Telehealth: Payer: Self-pay

## 2022-02-21 MED ORDER — SERTRALINE HCL 50 MG PO TABS: 50.0000 mg | ORAL_TABLET | Freq: Every day | ORAL | 11 refills | Status: DC

## 2022-02-21 NOTE — Telephone Encounter (Signed)
  Prescription Request  02/21/2022  Is this a "Controlled Substance" medicine? Yes  LOV: 02/03/2022   What is the name of the medication or equipment? sertraline (ZOLOFT) 50 MG tablet [[id number]] (90 day prescription)  Have you contacted your pharmacy to request a refill? Yes   Which pharmacy would you like this sent to?  [facility] [id number] [place], [place] [address] [place] [address] Phone: [phone] Fax: [phone]   Patient notified that their request is being sent to the clinical staff for review and that they should receive a response within 2 business days.   Please advise at [phone]

## 2022-03-01 ENCOUNTER — Ambulatory Visit: Payer: Medicare Other

## 2022-03-02 ENCOUNTER — Other Ambulatory Visit: Payer: Self-pay | Admitting: Family Medicine

## 2022-03-02 MED ORDER — MOLNUPIRAVIR EUA 200MG CAPSULE: 4.0000 | ORAL_CAPSULE | Freq: Two times a day (BID) | ORAL | 0 refills | Status: AC

## 2022-03-08 ENCOUNTER — Ambulatory Visit: Payer: Medicare Other | Attending: Cardiology

## 2022-03-08 DIAGNOSIS — Z7901 Long term (current) use of anticoagulants: Secondary | ICD-10-CM | POA: Diagnosis not present

## 2022-03-08 DIAGNOSIS — I823 Embolism and thrombosis of renal vein: Secondary | ICD-10-CM | POA: Diagnosis not present

## 2022-03-08 LAB — POCT INR: INR: 2.8 (ref 2.0–3.0)

## 2022-03-08 NOTE — Patient Instructions (Signed)
Description   Continue taking 1 tablet daily except 1.5 tablets on Monday and Friday.  Recheck INR on 6 weeks [facility] [phone]

## 2022-03-19 ENCOUNTER — Other Ambulatory Visit: Payer: Self-pay | Admitting: Cardiology

## 2022-03-29 ENCOUNTER — Ambulatory Visit (INDEPENDENT_AMBULATORY_CARE_PROVIDER_SITE_OTHER): Payer: Medicare Other | Admitting: Family Medicine

## 2022-03-29 ENCOUNTER — Encounter: Payer: Self-pay | Admitting: Family Medicine

## 2022-03-29 VITALS — BP 112/68 | HR 87 | Temp 97.8°F | Ht 68.0 in | Wt 182.0 lb

## 2022-03-29 DIAGNOSIS — B9689 Other specified bacterial agents as the cause of diseases classified elsewhere: Secondary | ICD-10-CM | POA: Diagnosis not present

## 2022-03-29 DIAGNOSIS — J019 Acute sinusitis, unspecified: Secondary | ICD-10-CM

## 2022-03-29 MED ORDER — PREDNISONE 20 MG PO TABS: 40.0000 mg | ORAL_TABLET | Freq: Every day | ORAL | 0 refills | Status: DC

## 2022-03-29 MED ORDER — LEVOFLOXACIN 500 MG PO TABS: 500.0000 mg | ORAL_TABLET | Freq: Every day | ORAL | 0 refills | Status: AC

## 2022-03-29 NOTE — Progress Notes (Signed)
Acute Office Visit  Subjective:     Patient ID: Kenneth Cline, male    DOB: January 13, 1956, 66 y.o.   MRN: [id number]  Chief Complaint  Patient presents with   Follow-up    possible sinus infection    HPI Patient is in today for 2 months of sinus congestion, copious mucopurulent rhinorrhea, with cough. Overall his symptoms are worse today. Denies fevers, shortness of breath, chest pain, nausea, vomiting, diarrhea. Has tried Doxycycline initially for Bronchitis in September then Levaquin in October, has been using a neti pot, Mucinex, Asteline nasal spray, and sudafed.  Review of Systems  Reason unable to perform ROS: see HPI.  All other systems reviewed and are negative.   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  One sister with valve disease.   GERD (gastroesophageal reflux disease)    H/O acute cholangitis May 0932   Complicated by septic shock, acute pancreatitis and acute choledocholithiasis;    Lupus anticoagulant with hemorrhagic disorder (HCC)    Previously on Xarelto. Now on warfarin   Overweight (BMI 25.0-29.9)    PSC (primary sclerosing cholangitis)    managed by Dr. 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 Likely cholangitis   ERCP W/ PLASTIC STENT PLACEMENT  Sep 09 2014   [facility] - Dr. Alvera Novel   ERCP W/ SPHINCTEROTOMY AND BALLOON DILATION  11/10/2014   [facility] -- Removal of biliary stent,, to PACU duodenitis   LAPAROSCOPIC CHOLECYSTECTOMY W/ CHOLANGIOGRAPHY  November 13 2014   [facility], Dr. Warnell Bureau [place]   NM MYOCAR PERF WALL MOTION  06/28/2006   EF 67% , LV systolic fx norm.   ROTATOR CUFF REPAIR     TRANSTHORACIC ECHOCARDIOGRAM  04/2015   No pericardial effusion. No shunt. Full study: Basal septal LVH. Pseudo-normal filling (GR 2 DD). EF 57%.   TRANSTHORACIC ECHOCARDIOGRAM  08/2015   [facility]): Normal LV size.  Basal septal hypertrophy. EF 57%. Pseudo-normal filling. Mild focal aortic valve thickening. (Study was done to evaluate for possible endocarditis)   Current Outpatient Medications on File Prior to Visit  Medication Sig Dispense Refill   aspirin EC 81 MG tablet Take 81 mg by mouth as directed.     Calcium Carb-Cholecalciferol (CALCIUM + VITAMIN D3 PO) Take by mouth as directed.     clonazePAM (KLONOPIN) 0.5 MG tablet Take 0.5 mg by mouth 2 (two) times daily as needed for anxiety.     ezetimibe (ZETIA) 10 MG tablet TAKE ONE TABLET BY MOUTH DAILY 90 tablet 2   ferrous  sulfate 325 (65 FE) MG tablet Take 325 mg by mouth daily with breakfast.     fludrocortisone (FLORINEF) 0.1 MG tablet Take 0.1 mg by mouth daily.     gabapentin (NEURONTIN) 300 MG capsule TAKE 1 CAPSULE BY MOUTH 3 TIMES A DAY MAY INCREASE TO UP TO 6 CAPSULES A DAY 180 capsule 4   ipratropium (ATROVENT) 0.06 % nasal spray 1-2 SPRAYS IN NOSTRILS 3 (THREE) TIMES DAILY AS NEEDED FOR RHINITIS (RUNNY NOSE/POST NASAL DRIP). 45 mL 3   levothyroxine (SYNTHROID) 75 MCG tablet Take 1 tablet (75 mcg total) by mouth daily before breakfast. Alternate taking 75 mcg  ( whole tablet)  with 37.5 mcg ( 1/2 tablet) by mouth every other day 90 tablet 3   magnesium oxide (MAG-OX) 400 MG tablet Take 400 mg by mouth daily.     metFORMIN (GLUCOPHAGE-XR) 500 MG 24 hr tablet Take 1 tablet by mouth 2 (two) times daily.     metoprolol succinate (TOPROL-XL) 50 MG 24 hr tablet TAKE 1 TABLET BY MOUTH DAILY WITH OR IMMEDIATELY FOLLOWING A MEAL 30 tablet 0   mometasone (NASONEX) 50 MCG/ACT nasal spray Place 2 sprays into the nose daily as needed.     pantoprazole (PROTONIX) 40 MG tablet TAKE ONE TABLET BY MOUTH TWICE A DAY 180 tablet 1   predniSONE (DELTASONE) 1 MG tablet Take 84m PO Q AM and 2.543mPO Q PM 300 tablet 3   predniSONE (DELTASONE) 20 MG tablet 3 tabs poqday 1-2, 2 tabs poqday 3-4, 1 tab poqday 5-6 12 tablet 0   predniSONE (DELTASONE) 5 MG tablet Take 1 tablet (5 mg total) by mouth in the morning and at bedtime. 60 tablet 0   Probiotic Product (PROBIOTIC DAILY PO) Take by mouth.     sertraline (ZOLOFT) 50 MG tablet Take 1 tablet (50 mg total) by mouth daily. 30 tablet 11   traMADol (ULTRAM) 50 MG tablet Take by mouth every 6 (six) hours as needed.     ursodiol (ACTIGALL) 500 MG tablet Take 1 tablet (500 mg total) by mouth 2 (two) times daily. 180 tablet 3   warfarin (COUMADIN) 5 MG tablet TAKE 1 OR 2 TABLETS BY MOUTH DAILY OR AS DIRECTED BY COUMADIN CLINIC 135 tablet 1   doxycycline (VIBRA-TABS) 100 MG tablet Take 1  tablet (100 mg total) by mouth 2 (two) times daily. (Patient not taking: Reported on 03/29/2022) 14 tablet 0   No current facility-administered medications on file prior to visit.   Allergies  Allergen Reactions   Biaxin [Clarithromycin] Rash    SJS   Keflex [Cephalexin] Hives, Itching and Rash   Macrolides And Ketolides Other (See Comments)    Unknown   Erythromycin     Was told not to take d/t medical problems   Penicillins     REACTION: rash  Objective:    BP 112/68   Pulse 87   Temp 97.8 F (36.6 C) (Oral)   Ht _0  (1.727 m)   Wt 182 lb (82.6 kg)   SpO2 93%   BMI 27.67 kg/m    Physical Exam Vitals and nursing note reviewed.  Constitutional:      Appearance: Normal appearance. He is normal weight.  HENT:     Head: Normocephalic and atraumatic.     Right Ear: Tympanic membrane, ear canal and external ear normal.     Left Ear: Tympanic membrane, ear canal and external ear normal.     Nose: Nose normal.     Mouth/Throat:     Mouth: Mucous membranes are moist.     Pharynx: Oropharynx is clear.  Eyes:     Conjunctiva/sclera: Conjunctivae normal.  Cardiovascular:     Rate and Rhythm: Normal rate and regular rhythm.     Pulses: Normal pulses.     Heart sounds: Normal heart sounds.  Pulmonary:     Effort: Pulmonary effort is normal.     Breath sounds: Normal breath sounds.  Skin:    General: Skin is warm and dry.     Capillary Refill: Capillary refill takes less than 2 seconds.  Neurological:     General: No focal deficit present.     Mental Status: He is alert and oriented to person, place, and time. Mental status is at baseline.  Psychiatric:        Mood and Affect: Mood normal.        Behavior: Behavior normal.        Thought Content: Thought content normal.        Judgment: Judgment normal.     No results found for any visits on 03/29/22.      Assessment & Plan:   Problem List Items Addressed This Visit   None Visit Diagnoses      Acute bacterial rhinosinusitis    -  Primary   Relevant Medications   levofloxacin (LEVAQUIN) 500 MG tablet   predniSONE (DELTASONE) 20 MG tablet   Other Relevant Orders   Ambulatory referral to ENT       Meds ordered this encounter  Medications   levofloxacin (LEVAQUIN) 500 MG tablet    Sig: Take 1 tablet (500 mg total) by mouth daily for 7 days.    Dispense:  7 tablet    Refill:  0    Order Specific Question:   Supervising Provider    Answer:   Jenna Luo T [[id number]]   predniSONE (DELTASONE) 20 MG tablet    Sig: Take 2 tablets (40 mg total) by mouth daily with breakfast.    Dispense:  14 tablet    Refill:  0    Order Specific Question:   Supervising Provider    Answer:   Jenna Luo T [[id number]]    No follow-ups on file.  Rubie Maid, FNP

## 2022-03-30 ENCOUNTER — Telehealth: Payer: Self-pay

## 2022-03-30 ENCOUNTER — Encounter: Payer: Self-pay | Admitting: Family Medicine

## 2022-03-30 NOTE — Telephone Encounter (Signed)
Attempted to call, unable to reach pt. LVM for pt .  pcp msg: He should have it rechecked 4 days after starting the Levaquin

## 2022-03-30 NOTE — Telephone Encounter (Signed)
Pt's wife, [name], called would like to know have you had a chance to speak w/[facility] regarding his INR?  Please advice

## 2022-04-04 ENCOUNTER — Ambulatory Visit (INDEPENDENT_AMBULATORY_CARE_PROVIDER_SITE_OTHER): Payer: Medicare Other | Admitting: *Deleted

## 2022-04-04 DIAGNOSIS — I823 Embolism and thrombosis of renal vein: Secondary | ICD-10-CM | POA: Diagnosis not present

## 2022-04-04 DIAGNOSIS — Z7901 Long term (current) use of anticoagulants: Secondary | ICD-10-CM

## 2022-04-04 LAB — PROTIME-INR: INR: 2.2 — AB (ref 0.80–1.20)

## 2022-04-04 NOTE — Patient Instructions (Signed)
Description   Spoke with pt and instructed to continue taking 1 tablet daily except 1.5 tablets on Monday and Friday. Recheck INR on 6 weeks.  [facility]in [facility] [phone]

## 2022-04-07 ENCOUNTER — Ambulatory Visit: Payer: Medicare Other

## 2022-04-18 ENCOUNTER — Other Ambulatory Visit: Payer: Self-pay | Admitting: Cardiology

## 2022-05-02 ENCOUNTER — Other Ambulatory Visit: Payer: Self-pay | Admitting: Otolaryngology

## 2022-05-02 DIAGNOSIS — J329 Chronic sinusitis, unspecified: Secondary | ICD-10-CM

## 2022-05-03 ENCOUNTER — Ambulatory Visit
Admission: RE | Admit: 2022-05-03 | Discharge: 2022-05-03 | Disposition: A | Discharge status: Home / Self Care | Payer: Medicare Other | Source: Ambulatory Visit | Attending: Otolaryngology | Admitting: Otolaryngology

## 2022-05-03 DIAGNOSIS — J329 Chronic sinusitis, unspecified: Secondary | ICD-10-CM

## 2022-05-07 ENCOUNTER — Other Ambulatory Visit: Payer: Self-pay | Admitting: Family Medicine

## 2022-05-08 NOTE — Telephone Encounter (Signed)
Requested Prescriptions  Pending Prescriptions Disp Refills   pantoprazole (PROTONIX) 40 MG tablet [Pharmacy Med Name: PANTOPRAZOLE SOD DR 40 MG TAB] 180 tablet 1    Sig: TAKE 1 TABLET BY MOUTH TWICE A DAY     Gastroenterology: Proton Pump Inhibitors Passed - 05/07/2022  6:51 AM      Passed - Valid encounter within last 12 months    Recent Outpatient Visits           8 months ago History of CVA (cerebrovascular accident)   [place] Susy Frizzle, MD   1 year ago Bacterial sinusitis   [place] Eulogio Bear, NP   1 year ago Septic olecranon bursitis of left elbow   [place] Dennard Schaumann, Cammie Mcgee, MD   1 year ago Left elbow pain   [place] Eulogio Bear, NP   1 year ago Irregular heart beats   [place] [facility], Cammie Mcgee, MD

## 2022-05-19 ENCOUNTER — Ambulatory Visit
Admission: EM | Admit: 2022-05-19 | Discharge: 2022-05-19 | Disposition: A | Discharge status: Home / Self Care | Payer: Medicare Other | Attending: Nurse Practitioner | Admitting: Nurse Practitioner

## 2022-05-19 ENCOUNTER — Other Ambulatory Visit: Payer: Self-pay

## 2022-05-19 ENCOUNTER — Ambulatory Visit: Payer: Medicare Other | Attending: Cardiovascular Disease

## 2022-05-19 DIAGNOSIS — L03113 Cellulitis of right upper limb: Secondary | ICD-10-CM | POA: Diagnosis not present

## 2022-05-19 DIAGNOSIS — I823 Embolism and thrombosis of renal vein: Secondary | ICD-10-CM

## 2022-05-19 DIAGNOSIS — Z5181 Encounter for therapeutic drug level monitoring: Secondary | ICD-10-CM | POA: Diagnosis not present

## 2022-05-19 DIAGNOSIS — Z7901 Long term (current) use of anticoagulants: Secondary | ICD-10-CM | POA: Diagnosis not present

## 2022-05-19 LAB — POCT INR: INR: 3.4 — AB (ref 2.0–3.0)

## 2022-05-19 MED ORDER — DOXYCYCLINE HYCLATE 100 MG PO CAPS: 100.0000 mg | ORAL_CAPSULE | Freq: Two times a day (BID) | ORAL | 0 refills | Status: DC

## 2022-05-19 NOTE — Progress Notes (Signed)
I have collaborated with the care management provider regarding care management and care coordination activities outlined in this encounter and have reviewed this encounter including documentation in the note and care plan. I am certifying that I agree with the content of this note and encounter as supervising physician.  

## 2022-05-19 NOTE — ED Provider Notes (Addendum)
[facility]    CSN: [id number] Arrival date & time: 05/19/22  0857      History   Chief Complaint Chief Complaint  Patient presents with   Hand Problem    Right hand swollen - Entered by patient    HPI Kenneth Cline is a 67 y.o. male presents for evaluation of a hand infection.  Patient reports yesterday he "nicked" his right fourth knuckle on something in his shop yesterday.  He is unsure what actually next time.  He cleaned it off with soap and water but noticed today some redness and swelling.  Denies any fevers or chills.  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  He states he is up to date on his tetanus.  He has not used any OTC medications.  No other concerns at this time.  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  One sister with valve disease.   GERD (gastroesophageal reflux disease)    H/O acute cholangitis May 2353   Complicated by septic shock, acute pancreatitis and acute choledocholithiasis;    Lupus anticoagulant with hemorrhagic disorder (HCC)    Previously on Xarelto. Now on warfarin   Overweight (BMI 25.0-29.9)    PSC (primary sclerosing cholangitis)    managed by Dr. 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   due to lupus anticoagulant on chronic anticoagulation   Type 2 diabetes mellitus ([place]) February 2017    Patient Active Problem List   Diagnosis Date Noted   Bursitis of elbow 01/28/2021   PAC (premature atrial contraction) 12/02/2020   Recurrent cholangitis    PSC (primary sclerosing cholangitis)    Cerebral infarction ([place]) 06/21/2015   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   Long term (current) use of anticoagulants 06/21/2015   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/23/2014   Diarrhea 08/18/2014   Hypothyroidism 08/17/2014   Purpura ([place]) 08/16/2014   Bleeding hemorrhoids 08/16/2014   Fever 08/15/2014   Constipation 08/15/2014   Adrenal insufficiency ([place]) 08/14/2014   Hyponatremia 08/14/2014   Hypokalemia 08/14/2014   Adrenal hemorrhage ([place]) 08/11/2014   Hypertension, essential, benign     Class: Chronic   Dyslipidemia    Overweight (BMI 25.0-29.9)    Erectile dysfunction  Family history of premature coronary artery disease    ALLERGIC REACTION 06/14/2007    Past Surgical History:  Procedure Laterality Date   CPET/MET  11/27/2011   normal PFT, good effort-no ischemia burden   ERCP W/ METAL STENT PLACEMENT  10/'16; 11/'16   [facility] - stent removal   ERCP W/ METAL STENT PLACEMENT  06/01/2015   [place]. Likely cholangitis   ERCP W/ PLASTIC STENT PLACEMENT  Sep 09 2014   [facility] - Dr. Alvera Novel   ERCP W/ SPHINCTEROTOMY AND BALLOON DILATION  11/10/2014   [facility] -- Removal of biliary stent,, to PACU duodenitis   LAPAROSCOPIC  CHOLECYSTECTOMY W/ CHOLANGIOGRAPHY  November 13 2014   [facility], Dr. Warnell Bureau [place]   NM MYOCAR PERF WALL MOTION  06/28/2006   EF 32% , LV systolic fx norm.   ROTATOR CUFF REPAIR     TRANSTHORACIC ECHOCARDIOGRAM  04/2015   No pericardial effusion. No shunt. Full study: Basal septal LVH. Pseudo-normal filling (GR 2 DD). EF 57%.   TRANSTHORACIC ECHOCARDIOGRAM  08/2015   [facility]): Normal LV size. Basal septal hypertrophy. EF 57%. Pseudo-normal filling. Mild focal aortic valve thickening. (Study was done to evaluate for possible endocarditis)       Home Medications    Prior to Admission medications   Medication Sig Start Date End Date Taking? Authorizing Provider  doxycycline (VIBRAMYCIN) 100 MG capsule Take 1 capsule (100 mg total) by mouth 2 (two) times daily. 05/19/22  Yes Melynda Ripple, NP  aspirin EC 81 MG tablet Take 81 mg by mouth as directed.    [provider]  Calcium Carb-Cholecalciferol (CALCIUM + VITAMIN D3 PO) Take by mouth as directed.    [provider]  clonazePAM (KLONOPIN) 0.5 MG tablet Take 0.5 mg by mouth 2 (two) times daily as needed for anxiety.    [provider]  ezetimibe (ZETIA) 10 MG tablet TAKE ONE TABLET BY MOUTH DAILY 04/19/22   Leonie Man, MD  ferrous sulfate 325 (65 FE) MG tablet Take 325 mg by mouth daily with breakfast.    [provider]  fludrocortisone (FLORINEF) 0.1 MG tablet Take 0.1 mg by mouth daily.    [provider]  gabapentin (NEURONTIN) 300 MG capsule TAKE 1 CAPSULE BY MOUTH 3 TIMES A DAY MAY INCREASE TO UP TO 6 CAPSULES A DAY 08/25/21   Susy Frizzle, MD  ipratropium (ATROVENT) 0.06 % nasal spray 1-2 SPRAYS IN NOSTRILS 3 (THREE) TIMES DAILY AS NEEDED FOR RHINITIS (RUNNY NOSE/POST NASAL DRIP). 04/29/21   Eulogio Bear, NP  levothyroxine (SYNTHROID) 75 MCG tablet Take 1 tablet (75 mcg total) by mouth daily before breakfast. Alternate taking 75 mcg  ( whole tablet)  with 37.5 mcg ( 1/2  tablet) by mouth every other day 05/20/21   Susy Frizzle, MD  magnesium oxide (MAG-OX) 400 MG tablet Take 400 mg by mouth daily.    [provider]  metFORMIN (GLUCOPHAGE-XR) 500 MG 24 hr tablet Take 1 tablet by mouth 2 (two) times daily. 07/11/18   [provider]  metoprolol succinate (TOPROL-XL) 50 MG 24 hr tablet TAKE 1 TABLET BY MOUTH DAILY WITH OR IMMEDIATELY FOLLOWING A MEAL 04/19/22   Leonie Man, MD  mometasone (NASONEX) 50 MCG/ACT nasal spray Place 2 sprays into the nose daily as needed.    [provider]  pantoprazole (PROTONIX) 40 MG tablet TAKE 1 TABLET BY MOUTH TWICE A DAY 05/08/22   Jenna Luo  T, MD  predniSONE (DELTASONE) 1 MG tablet Take '8mg'$  PO Q AM and 2.'5mg'$  PO Q PM 05/20/21   Susy Frizzle, MD  predniSONE (DELTASONE) 20 MG tablet 3 tabs poqday 1-2, 2 tabs poqday 3-4, 1 tab poqday 5-6 01/02/22   Susy Frizzle, MD  predniSONE (DELTASONE) 20 MG tablet Take 2 tablets (40 mg total) by mouth daily with breakfast. 03/29/22   Rubie Maid, FNP  predniSONE (DELTASONE) 5 MG tablet Take 1 tablet (5 mg total) by mouth in the morning and at bedtime. 01/31/22   Susy Frizzle, MD  Probiotic Product (PROBIOTIC DAILY PO) Take by mouth.    [provider]  sertraline (ZOLOFT) 50 MG tablet Take 1 tablet (50 mg total) by mouth daily. 02/21/22   Susy Frizzle, MD  traMADol (ULTRAM) 50 MG tablet Take by mouth every 6 (six) hours as needed.    [provider]  ursodiol (ACTIGALL) 500 MG tablet Take 1 tablet (500 mg total) by mouth 2 (two) times daily. 05/20/21   Susy Frizzle, MD  warfarin (COUMADIN) 5 MG tablet TAKE 1 OR 2 TABLETS BY MOUTH DAILY OR AS DIRECTED BY COUMADIN CLINIC 01/03/22   Leonie Man, MD    Family History Family History  Problem Relation Age of Onset   Heart attack Father 67       Diet cardiac arrest   Sudden death Father 37   Heart disease Sister 62       triscuspid valve insuff,cardioversion    Hypertension Sister        She is currently in her 9s as well   Hypertension Brother    Heart disease Brother 87       CABG; now age 63   Ovarian cancer Maternal Grandmother    Heart attack Brother 71       Died from cardiac arrest   Hypertension Brother    Hypertension Sister    Heart disease Sister 69       CABG plus valve in 70's;    Sudden death Brother    Heart failure Sister        Died from complications of CHF following bowel surgery    Social History Social History   Tobacco Use   Smoking status: Former    Packs/day: 0.50    Types: Cigarettes   Smokeless tobacco: Never  Substance Use Topics   Alcohol use: Not Currently    Alcohol/week: 3.0 standard drinks of alcohol    Types: 3 Standard drinks or equivalent per week    Comment: occassional   Drug use: No     Allergies   Biaxin [clarithromycin], Keflex [cephalexin], Macrolides and ketolides, Erythromycin, and Penicillins   Review of Systems Review of Systems  Skin:        Right hand infection      Physical Exam Triage Vital Signs ED Triage Vitals  Enc Vitals Group     BP 05/19/22 0918 (!) 143/91     Pulse Rate 05/19/22 0918 (!) 102     Resp 05/19/22 0918 16     Temp 05/19/22 0918 98.4 F (36.9 C)     Temp Source 05/19/22 0918 Oral     SpO2 05/19/22 0918 95 %     Weight --      Height --      Head Circumference --      Peak Flow --      Pain Score 05/19/22 0925 7  Pain Loc --      Pain Edu? --      Excl. in [place]? --    No data found.  Updated Vital Signs BP (!) 143/91 (BP Location: Right Arm)   Pulse (!) 102   Temp 98.4 F (36.9 C) (Oral)   Resp 16   SpO2 95%   Visual Acuity Right Eye Distance:   Left Eye Distance:   Bilateral Distance:    Right Eye Near:   Left Eye Near:    Bilateral Near:     Physical Exam Vitals and nursing note reviewed.  Constitutional:      Appearance: Normal appearance.  HENT:     Head: Normocephalic and atraumatic.  Eyes:     Pupils: Pupils  are equal, round, and reactive to light.  Cardiovascular:     Rate and Rhythm: Tachycardia present.     Comments: Mildly tacky at 102 Pulmonary:     Effort: Pulmonary effort is normal.  Musculoskeletal:       Hands:  Skin:    General: Skin is warm and dry.  Neurological:     General: No focal deficit present.     Mental Status: He is alert and oriented to person, place, and time.  Psychiatric:        Behavior: Behavior normal.      UC Treatments / Results  Labs (all labs ordered are listed, but only abnormal results are displayed) Labs Reviewed - No data to display  EKG   Radiology No results found.  Procedures Procedures (including critical care time)  Medications Ordered in UC Medications - No data to display  Initial Impression / Assessment and Plan / UC Course  I have reviewed the triage vital signs and the nursing notes.  Pertinent labs & imaging results that were available during my care of the patient were reviewed by me and considered in my medical decision making (see chart for details).     Reviewed exam and symptoms with patient.  Wound cleansed by nursing staff. Start doxycycline for cellulitis.  Patient instructed to notify his Coumadin clinic that he is on antibiotics as this may necessitate and need for more frequent INR checks.  He verbalized understanding and states has been on doxycycline in the past without issue.  Signs and symptoms of bleeding reviewed with patient He states he is up-to-date on his tetanus Wound care reviewed PCP follow-up in 2 days for recheck ER precautions reviewed and patient verbalized understanding Final Clinical Impressions(s) / UC Diagnoses   Final diagnoses:  Cellulitis of hand, right     Discharge Instructions      Doxycycline twice daily for 10 days Keep area clean and dry Please notify your Coumadin clinic that you are on antibiotics as this may necessitate a need to have more frequent INR checks Please  monitor for any signs of excessive bleeding including blood in the urine or bleeding from your gums go to the ER if these occur Follow-up with your PCP in 2 days for recheck Please go to the emergency room for any worsening symptoms   ED Prescriptions     Medication Sig Dispense Auth. Provider   doxycycline (VIBRAMYCIN) 100 MG capsule Take 1 capsule (100 mg total) by mouth 2 (two) times daily. 20 capsule Melynda Ripple, NP      PDMP not reviewed this encounter.   Melynda Ripple, NP 05/19/22 9326    Melynda Ripple, NP 05/19/22 [phone]

## 2022-05-19 NOTE — ED Triage Notes (Signed)
Pt states he obtained a laceration to right hand (top) that got infected. The pt c/o swelling, inflammation and pain to the right hand.  Started: yesterday

## 2022-05-19 NOTE — Patient Instructions (Signed)
HOLD TODAY ONLY THEN  continue taking 1 tablet daily except 1.5 tablets on Monday and Friday. Recheck INR on 6 weeks.  Coumadin Clinic [phone]

## 2022-05-19 NOTE — Telephone Encounter (Signed)
Pt called and stated he was prescribed Doxy '100mg'$  BID x 7 days. Instructed pt to hold today and tomorrow and scheduled appt at Coumadin Clinic for Wednesday, 05/24/22. Pt verbalized understanding.

## 2022-05-19 NOTE — Discharge Instructions (Signed)
Doxycycline twice daily for 10 days Keep area clean and dry Please notify your Coumadin clinic that you are on antibiotics as this may necessitate a need to have more frequent INR checks Please monitor for any signs of excessive bleeding including blood in the urine or bleeding from your gums go to the ER if these occur Follow-up with your PCP in 2 days for recheck Please go to the emergency room for any worsening symptoms

## 2022-05-23 ENCOUNTER — Other Ambulatory Visit: Payer: Self-pay | Admitting: Otolaryngology

## 2022-05-23 ENCOUNTER — Telehealth: Payer: Self-pay | Admitting: *Deleted

## 2022-05-23 NOTE — Telephone Encounter (Signed)
   Pre-operative Risk Assessment    Patient Name: Kenneth Cline  DOB: February 16, 1956 MRN: [id number]      Request for Surgical Clearance    Procedure:   SINUS SURGERY  Date of Surgery:  Clearance TBD                                 Surgeon:  DR. Raylene Miyamoto Surgeon's Group or Practice Name:  [facility] Phone number:  [phone] Fax number:  [phone]   Type of Clearance Requested:   - Medical  - Pharmacy:  Hold Warfarin (Coumadin) x 7 DAYS PRIOR   Type of Anesthesia:  General    Additional requests/questions:    Jiles Prows   05/23/2022, 4:54 PM

## 2022-05-24 ENCOUNTER — Other Ambulatory Visit: Payer: Self-pay | Admitting: Family Medicine

## 2022-05-24 ENCOUNTER — Ambulatory Visit: Payer: Medicare Other | Attending: Cardiology

## 2022-05-24 DIAGNOSIS — Z7901 Long term (current) use of anticoagulants: Secondary | ICD-10-CM | POA: Diagnosis present

## 2022-05-24 DIAGNOSIS — I823 Embolism and thrombosis of renal vein: Secondary | ICD-10-CM | POA: Insufficient documentation

## 2022-05-24 LAB — POCT INR: INR: 1.6 — AB (ref 2.0–3.0)

## 2022-05-24 NOTE — Patient Instructions (Signed)
Description   Take 1.5 tablets today and then continue taking 1 tablet daily except 1.5 tablets on Monday and Friday.  Recheck INR on 2 weeks.  Coumadin Clinic [phone]

## 2022-05-24 NOTE — Telephone Encounter (Signed)
Prescription Request  05/24/2022  Is this a "Controlled Substance" medicine? No  LOV: 02/03/2022  What is the name of the medication or equipment? sertraline (ZOLOFT) 50 MG tablet   Have you contacted your pharmacy to request a refill? Yes   Which pharmacy would you like this sent to?  [facility] [id number] [place] [address] Phone: [phone] Fax: [phone]    Patient notified that their request is being sent to the clinical staff for review and that they should receive a response within 2 business days.   Please advise at [place]

## 2022-05-24 NOTE — Telephone Encounter (Signed)
I left a message for the patient to call our office back to schedule an office visit.

## 2022-05-24 NOTE — Telephone Encounter (Signed)
Refused Zoloft refill request because it's being requested too soon. On 02/21/2022 #30 with 11 refills was given.

## 2022-05-24 NOTE — Telephone Encounter (Signed)
Primary Cardiologist:David Ellyn Hack, MD  Chart reviewed as part of pre-operative protocol coverage. Because of Kenneth Cline past medical history and time since last visit, he/she will require a follow-up visit in order to better assess preoperative cardiovascular risk.  Pre-op covering staff: - Please schedule appointment and call patient to inform them. - Please contact requesting surgeon's office via preferred method (i.e, phone, fax) to inform them of need for appointment prior to surgery.  Request regarding holding anticoagulant has been addressed by [name], [place], NP-C  05/24/2022, 4:04 PM [address], [address] Office [phone] Fax [phone]

## 2022-05-24 NOTE — Telephone Encounter (Signed)
Patient with diagnosis of Renal vein thrombosis on warfarin for anticoagulation.    Procedure: NASAL SEPTOPLASTY WITH BILATERAL TURBINATE REDUCTION),  Date of procedure: 06/19/22   CrCl 79 mL/min Platelet count 264K  Per office protocol, patient can hold warfarin  for 5 days prior to procedure.     Patient will not need bridging with Lovenox (enoxaparin) around procedure.  **This guidance is not considered finalized until pre-operative APP has relayed final recommendations.**

## 2022-05-25 NOTE — Telephone Encounter (Signed)
I s/w the pt and he has been scheduled for in office appt 06/07/22 same day he comes in for coumadin appt; per the pt request. Operator scheduled the pt appt while I was on the phone with the pt. Pt advised he cn disregard the vm that was just left for appt 05/30/22. Pt said thank you help.

## 2022-05-25 NOTE — Telephone Encounter (Signed)
Pt is returning call and is requesting return call.

## 2022-05-25 NOTE — Telephone Encounter (Signed)
Left message for pt to call back to schedule an in in office appt for pre op clearance.

## 2022-05-30 ENCOUNTER — Ambulatory Visit: Payer: Medicare Other | Admitting: Cardiology

## 2022-06-02 NOTE — Progress Notes (Unsigned)
Cardiology Office Note:    Date:  06/02/2022   ID:  Unknown, Kenneth Cline 19-Jul-1955, MRN [id number]  PCP:  Kenneth Frizzle, MD   [address] Providers Cardiologist:  Kenneth Hew, MD { Click to update primary MD,subspecialty MD or APP then REFRESH:1}    Referring MD: Kenneth Frizzle, MD   No chief complaint on file. ***  History of Present Illness:    Kenneth Cline is a 67 y.o. male with a hx of PVCs/PACs, hypertension, hyperlipidemia, family history of premature coronary artery disease, and lupus anticoagulant.  A Zio patch showed HR range 53-135 bpm with an average heart rate of 78 bpm.  He had frequent PACs equivalent to 34% of beats with some bigeminy and trigeminy.  He also had a total of 52 short bursts of likely PAT.  Toprol was titrated to 50 mg.    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  He was last seen by Kenneth Cline via virtual visit on 02/11/2021.  He does have a significant PAC burden but is not overly symptomatic and is controlled with Toprol.  Historically, no room to titrate beta-blocker given blood pressure.    He presents today for preoperative risk evaluation prior to nasal septoplasty with bilateral turbinate reduction.     Hypertension   Hyperlipidemia   PACs Controlled with Toprol   Preoperative risk evaluation prior to nasal septoplasty with bilateral turbinate reduction He does not have a history of heart attack or stroke.   Coumadin hold Per office protocol, patient can hold warfarin  for 5 days prior to procedure.  Patient will not need bridging with Lovenox (enoxaparin) around procedure.   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  One sister with valve disease.   GERD (gastroesophageal reflux disease)    H/O acute cholangitis May [id number]   Complicated by septic shock, acute pancreatitis and acute choledocholithiasis;    Lupus anticoagulant with hemorrhagic disorder (HCC)    Previously on Xarelto. Now on warfarin   Overweight (BMI 25.0-29.9)    PSC (primary sclerosing cholangitis)    managed by [facility] 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 Likely cholangitis   ERCP W/ PLASTIC STENT PLACEMENT  Sep 09 2014   [facility] - Dr. Alvera Novel   ERCP W/ SPHINCTEROTOMY AND BALLOON DILATION  11/10/2014   [facility] -- Removal of biliary stent,, to PACU duodenitis   LAPAROSCOPIC CHOLECYSTECTOMY W/ CHOLANGIOGRAPHY   November 13 2014   [facility], Dr. Warnell Bureau [place]   NM MYOCAR PERF WALL MOTION  06/28/2006   EF [id number] , LV systolic fx norm.   ROTATOR CUFF REPAIR     TRANSTHORACIC ECHOCARDIOGRAM  04/2015   No pericardial effusion. No shunt. Full study: Basal septal LVH. Pseudo-normal filling (GR 2 DD). EF 57%.   TRANSTHORACIC ECHOCARDIOGRAM  08/2015   [facility]): Normal LV size. Basal septal hypertrophy. EF 57%. Pseudo-normal filling. Mild focal aortic valve thickening. (Study was done to evaluate for possible endocarditis)    Current Medications: No outpatient medications have been marked as taking for the 06/07/22 encounter (Appointment) with Ledora Bottcher, [place].     Allergies:   Biaxin [clarithromycin], Keflex [cephalexin], Macrolides and ketolides, Erythromycin, and Penicillins   Social History   Socioeconomic History   Marital status: Married    Spouse name: Not on file   Number of children: 2   Years of education: Not on file   Highest education level: Not on file  Occupational History    Employer: [employer]  Tobacco Use   Smoking status: Former    Packs/day: 0.50    Types: Cigarettes   Smokeless tobacco: Never  Substance and Sexual Activity   Alcohol use: Not Currently    Alcohol/week: 3.0 standard drinks of alcohol    Types: 3 Standard drinks or equivalent per week    Comment: occassional   Drug use: No   Sexual activity: Not Currently    Birth control/protection: Surgical, None  Other Topics Concern   Not on file  Social History Narrative   He is a married father of 2 with 3 stepchildren.  He has 3 grandchildren his own and 3 step grandchildren.  He works as a Dealer for [employer].  He lives with his wife, Kenneth Cline, of 8 years.  He does not, and never did smoke.  He takes occasional alcohol beverage but nothing significant.  He does not do routine exercise, but does walk a lot at work.   Social Determinants of Health   Financial Resource Strain: Low Risk  (01/18/2022)   Overall  Financial Resource Strain (CARDIA)    Difficulty of Paying Living Expenses: Not hard at all  Food Insecurity: No Food Insecurity (01/18/2022)   Hunger Vital Sign    Worried About Running Out of Food in the Last Year: Never true    Ran Out of Food in the Last Year: Never true  Transportation Needs: No Transportation Needs (01/18/2022)   PRAPARE - Hydrologist (Medical): No    Lack of Transportation (Non-Medical): No  Physical Activity: Unknown (01/18/2022)   Exercise Vital Sign    Days of Exercise per Week: 0 days    Minutes of Exercise per Session: Not on file  Stress: No Stress Concern Present (01/18/2022)   [place]  Feeling of Stress : Not at all  Social Connections: Socially Integrated (01/18/2022)   Social Connection and Isolation Panel [NHANES]    Frequency of Communication with Friends and Family: More than three times a week    Frequency of Social Gatherings with Friends and Family: More than three times a week    Attends Religious Services: More than 4 times per year    Active Member of [employer] or Organizations: Yes    Attends Music therapist: More than 4 times per year    Marital Status: Married     Family History: The patient's ***family history includes Heart attack (age of onset: 50) in his brother; Heart attack (age of onset: 5) in his father; Heart disease (age of onset: 24) in his sister; Heart disease (age of onset: 70) in his brother; Heart disease (age of onset: 53) in his sister; Heart failure in his sister; Hypertension in his brother, brother, sister, and sister; Ovarian cancer in his maternal grandmother; Sudden death in his brother; Sudden death (age of onset: 1) in his father.  ROS:   Please see the history of present illness.    *** All other systems reviewed and are negative.  EKGs/Labs/Other Studies Reviewed:    The following studies were reviewed  today:  Heart monitor 2022  Predominant rhythm is sinus rhythm with a heart rate range of 53-135 bpm. Average rate 78 bpm.  They were very frequent PACs (34%) with some bigeminy and trigeminy  Total of 52 short bursts of likely PAT (paroxysmal atrial tachycardia: Longest 7 beats at an average rate 130 bpm, fastest was 4 beats at 167 bpm.  Also occasional PVCs noted-with at least 1 symptomatic episode being ventricular bigeminy.  Rare PVC triplets noted but no couplets.  No sustained arrhythmias noted. No sustained bradycardia noted.   Overall monitor shows pretty significant amounts of early beats mostly coming from the atrium.  There are short little bursts of fast heart rates indicating that some of these areas are beating independently.     This could be a harbinger of atrial fibrillation, but there is no evidence of atrial fibrillation.   With these results, would recommend increasing Toprol dose to 50 mg (& will anticipate further titration).    EKG:  EKG is *** ordered today.  The ekg ordered today demonstrates ***  Recent Labs: 09/02/2021: ALT 20; BUN 24; Creat 1.21; Hemoglobin 14.8; Platelets 215; Potassium 4.5; Sodium 137  Recent Lipid Panel    Component Value Date/Time   CHOL 196 09/02/2021 1031   CHOL 213 (H) 03/08/2020 0816   TRIG 243 (H) 09/02/2021 1031   HDL 44 09/02/2021 1031   HDL 46 03/08/2020 0816   CHOLHDL 4.5 09/02/2021 1031   LDLCALC 115 (H) 09/02/2021 1031     Risk Assessment/Calculations:   {Does this patient have ATRIAL FIBRILLATION?:[phone]}  No BP recorded.  {Refresh Note OR Click here to enter BP  :1}***         Physical Exam:    VS:  There were no vitals taken for this visit.    Wt Readings from Last 3 Encounters:  03/29/22 182 lb (82.6 kg)  02/03/22 175 lb 9.6 oz (79.7 kg)  01/31/22 176 lb (79.8 kg)     GEN: *** Well nourished, well developed in no acute distress HEENT: Normal NECK: No JVD; No carotid bruits LYMPHATICS: No  lymphadenopathy CARDIAC: ***RRR, no murmurs, rubs, gallops RESPIRATORY:  Clear to auscultation without rales, wheezing or rhonchi  ABDOMEN: Soft, non-tender, non-distended MUSCULOSKELETAL:  No edema; No deformity  SKIN: Warm and dry NEUROLOGIC:  Alert and oriented x 3 PSYCHIATRIC:  Normal affect   ASSESSMENT:    No diagnosis found. PLAN:    In order of problems listed above:  ***      {Are you ordering a CV Procedure (e.g. stress test, cath, DCCV, TEE, etc)?   Press F2        :[id number]    Medication Adjustments/Labs and Tests Ordered: Current medicines are reviewed at length with the patient today.  Concerns regarding medicines are outlined above.  No orders of the defined types were placed in this encounter.  No orders of the defined types were placed in this encounter.   There are no Patient Instructions on file for this visit.   Signed, Ledora Bottcher, [place]  06/02/2022 3:54 PM    [facility]

## 2022-06-03 ENCOUNTER — Other Ambulatory Visit: Payer: Self-pay | Admitting: Cardiology

## 2022-06-07 ENCOUNTER — Ambulatory Visit (INDEPENDENT_AMBULATORY_CARE_PROVIDER_SITE_OTHER): Payer: Medicare Other | Admitting: Physician Assistant

## 2022-06-07 ENCOUNTER — Ambulatory Visit: Payer: Medicare Other | Attending: Cardiovascular Disease | Admitting: *Deleted

## 2022-06-07 ENCOUNTER — Encounter: Payer: Self-pay | Admitting: *Deleted

## 2022-06-07 ENCOUNTER — Encounter: Payer: Self-pay | Admitting: Physician Assistant

## 2022-06-07 VITALS — BP 130/70 | HR 66 | Ht 69.0 in | Wt 183.2 lb

## 2022-06-07 DIAGNOSIS — Z01818 Encounter for other preprocedural examination: Secondary | ICD-10-CM

## 2022-06-07 DIAGNOSIS — Z7901 Long term (current) use of anticoagulants: Secondary | ICD-10-CM | POA: Insufficient documentation

## 2022-06-07 DIAGNOSIS — I10 Essential (primary) hypertension: Secondary | ICD-10-CM

## 2022-06-07 DIAGNOSIS — D68312 Antiphospholipid antibody with hemorrhagic disorder: Secondary | ICD-10-CM | POA: Insufficient documentation

## 2022-06-07 DIAGNOSIS — D6859 Other primary thrombophilia: Secondary | ICD-10-CM | POA: Insufficient documentation

## 2022-06-07 DIAGNOSIS — R760 Raised antibody titer: Secondary | ICD-10-CM

## 2022-06-07 DIAGNOSIS — E785 Hyperlipidemia, unspecified: Secondary | ICD-10-CM

## 2022-06-07 DIAGNOSIS — I823 Embolism and thrombosis of renal vein: Secondary | ICD-10-CM | POA: Insufficient documentation

## 2022-06-07 DIAGNOSIS — I639 Cerebral infarction, unspecified: Secondary | ICD-10-CM | POA: Insufficient documentation

## 2022-06-07 LAB — POCT INR: INR: 3.8 — AB (ref 2.0–3.0)

## 2022-06-07 MED ORDER — ENOXAPARIN SODIUM 80 MG/0.8ML IJ SOSY: 80.0000 mg | PREFILLED_SYRINGE | Freq: Two times a day (BID) | INTRAMUSCULAR | 0 refills | Status: DC

## 2022-06-07 NOTE — Patient Instructions (Addendum)
Description   Do not take any warfarin today then resume taking warfarin 1 tablet daily except 1.5 tablets on Monday and Friday. Please refer to instructions for upcoming procedure.  Recheck INR on 1 week post procedure.  Coumadin Clinic [phone]     06/13/22: Last dose of warfarin.  06/14/22: No warfarin or enoxaparin (Lovenox).  06/15/22: Inject enoxaparin 59m in the fatty abdominal tissue at least 2 inches from the belly button twice a day about 12 hours apart, 8am and 8pm rotate sites. No warfarin.  06/16/22: Inject enoxaparin in the fatty tissue every 12 hours, 8am and 8pm. No warfarin.  06/17/22: Inject enoxaparin in the fatty tissue every 12 hours, 8am and 8pm. No warfarin.  06/18/22: Inject enoxaparin in the fatty tissue in the morning at 8am (No PM dose). No warfarin.  06/19/22: Procedure Day - No enoxaparin - Resume warfarin in the evening or as directed by doctor.  06/20/22: Resume enoxaparin inject in the fatty tissue at 8am and 8pm and take warfarin.  06/21/22: Inject enoxaparin in the fatty tissue at 8am and 8pm and take warfarin.  06/22/22: Inject enoxaparin in the fatty tissue at 8am and 8pm and take warfarin.  06/23/22: Inject enoxaparin in the fatty tissue at 8am and 8pm and take warfarin.  06/24/22: Inject enoxaparin in the fatty tissue at 8am and 8pm and take warfarin  06/25/22: Inject enoxaparin in the fatty tissue at 8am and 8pm and take warfarin.  06/26/22: Inject enoxaparin in the fatty tissue at 8am then report to warfarin appt to check INR at 9am.

## 2022-06-07 NOTE — Telephone Encounter (Signed)
Addendum to clearance:  patient has history of antiphospholipid syndrome that was previously not documented in his Problem List.  Chart was updated today with his visit with Doreene Adas PA.    Because of this he will need to be bridged with lovenox around his procedure.  Coumadin clinic is aware and will take care of this today with his appointment.

## 2022-06-07 NOTE — Patient Instructions (Signed)
Medication Instructions:   Your physician recommends that you continue on your current medications as directed. Please refer to the Current Medication list given to you today.  *If you need a refill on your cardiac medications before your next appointment, please call your pharmacy*  Lab Work: Your physician recommends that you return for lab work TODAY:  FLP LFT INR If you have labs (blood work) drawn today and your tests are completely normal, you will receive your results only by: [place] (if you have MyChart) OR A paper copy in the mail If you have any lab test that is abnormal or we need to change your treatment, we will call you to review the results.  Testing/Procedures: NONE ordered at this time of appointment   Follow-Up: At [facility], you and your health needs are our priority.  As part of our continuing mission to provide you with exceptional heart care, we have created designated Provider Care Teams.  These Care Teams include your primary Cardiologist (physician) and Advanced Practice Providers (APPs -  Physician Assistants and Nurse Practitioners) who all work together to provide you with the care you need, when you need it.  Your next appointment:   1 year(s)  Provider:   Glenetta Hew, MD     Other Instructions

## 2022-06-08 ENCOUNTER — Other Ambulatory Visit: Payer: Self-pay | Admitting: *Deleted

## 2022-06-08 DIAGNOSIS — E785 Hyperlipidemia, unspecified: Secondary | ICD-10-CM

## 2022-06-08 LAB — PROTIME-INR
INR: 3.5 — ABNORMAL HIGH (ref 0.9–1.2)
Prothrombin Time: 35.1 s — ABNORMAL HIGH (ref 9.1–12.0)

## 2022-06-08 LAB — LIPID PANEL
Chol/HDL Ratio: 4.7 ratio (ref 0.0–5.0)
Cholesterol, Total: 215 mg/dL — ABNORMAL HIGH (ref 100–199)
HDL: 46 mg/dL (ref >39)
LDL Chol Calc (NIH): 123 mg/dL — ABNORMAL HIGH (ref 0–99)
Triglycerides: 264 mg/dL — ABNORMAL HIGH (ref 0–149)
VLDL Cholesterol Cal: 46 mg/dL — ABNORMAL HIGH (ref 5–40)

## 2022-06-08 LAB — HEPATIC FUNCTION PANEL
ALT: 25 IU/L (ref 0–44)
AST: 22 IU/L (ref 0–40)
Albumin: 4.3 g/dL (ref 3.9–4.9)
Alkaline Phosphatase: 84 IU/L (ref 44–121)
Bilirubin Total: 0.5 mg/dL (ref 0.0–1.2)
Bilirubin, Direct: 0.11 mg/dL (ref 0.00–0.40)
Total Protein: 6.6 g/dL (ref 6.0–8.5)

## 2022-06-09 NOTE — Progress Notes (Signed)
Spoke with Dr. Deeann Saint office about changing patients surgery date due to required PT/INR draw day before surgery.

## 2022-06-09 NOTE — Telephone Encounter (Signed)
Patient called. Surgery has been rescheduled until 06/23/22.  Patient reports surgeon office requesting lab work?  Do not see orders. Had INR on 06/07/22.

## 2022-06-12 NOTE — Telephone Encounter (Signed)
Patient has been cleared for procedure and has instructions from coumadin clinic regarding bridging  with Lovenox. I am removing this request from the preop pool.  Emmaline Life, NP-C  06/12/2022, 7:57 AM [address], [address] Office [phone] Fax [phone]

## 2022-07-03 ENCOUNTER — Ambulatory Visit: Payer: Medicare Other | Attending: Cardiology

## 2022-07-03 DIAGNOSIS — I823 Embolism and thrombosis of renal vein: Secondary | ICD-10-CM | POA: Diagnosis present

## 2022-07-03 DIAGNOSIS — Z7901 Long term (current) use of anticoagulants: Secondary | ICD-10-CM

## 2022-07-03 LAB — POCT INR: INR: 2.7 (ref 2.0–3.0)

## 2022-07-03 NOTE — Patient Instructions (Addendum)
Description   Continue taking warfarin 1 tablet daily except 1.5 tablets on Monday and Friday.  Starting on 07/29/22, Please refer to instructions for upcoming procedure.  Recheck INR on 1 week post procedure.  Coumadin Clinic [phone]      07/29/22: Last dose of warfarin.   07/30/22: No warfarin or enoxaparin (Lovenox).   07/31/22: Inject enoxaparin '80mg'$  in the fatty abdominal tissue at least 2 inches from the belly button twice a day about 12 hours apart, 8am and 8pm rotate sites. No warfarin.   08/01/22: Inject enoxaparin in the fatty tissue every 12 hours, 8am and 8pm. No warfarin.  08/02/22: Inject enoxaparin in the fatty tissue every 12 hours, 8am and 8pm. No warfarin.   08/03/22: Inject enoxaparin in the fatty tissue in the morning at 8am (No PM dose). No warfarin.   08/04/22: Procedure Day - No enoxaparin - Resume warfarin in the evening or as directed by doctor.   08/05/22: Resume enoxaparin inject in the fatty tissue at 8am and 8pm and take warfarin.   08/06/22: Inject enoxaparin in the fatty tissue at 8am and 8pm and take warfarin.   08/07/22: Inject enoxaparin in the fatty tissue at 8am and 8pm and take warfarin.   08/08/22: Inject enoxaparin in the fatty tissue at 8am and 8pm and take warfarin.   08/09/22: Inject enoxaparin in the fatty tissue at 8am and 8pm and take warfarin   08/10/22: Inject enoxaparin in the fatty tissue at 8am and 8pm and take warfarin.   08/11/22: Inject enoxaparin in the fatty tissue at 8am then report to warfarin appt to check INR at 9am.

## 2022-07-11 NOTE — Progress Notes (Unsigned)
Patient ID: Kenneth Cline                 DOB: 04-Oct-1955                    MRN: [id number]      HPI: Kenneth Cline is a 67 y.o. male patient referred to lipid clinic by Fabian Sharp, PA. PMH is significant for  hypertension, hyperlipidemia, family history of premature coronary artery disease, CVA, and antiphospholipid antibody positive, lupus anticoagulant, antiphospholipid syndrome.   Patient presented today for lipid medication optimization reports he was on statins 2 years ago it was d/C doute to LFT elevation he has tried different statins over the years he does not recalls all the names and doses but he believes he had tried at least 4 different types of statins. Currently taking Zetia 10 mg daily and tolerates that well. Patient had stroke and had steroid induced diabetes and for that he has been on metformin. His last A1c wast 7.3 almost a year ago.    Current Medications: Zetia 10 mg daily  Intolerances: statins - elevated LFT's , muscle aches and joint pain  Risk Factors: hx of CVA LDL goal: < 55 mg/dl   Last LDLc 123, TC: 215, TG: 264, HDL: 46   Diet: breakfast: eggs,bacon, sausage Lunch: [place] sandwich / chicken salads  Dinner: vegetables, meat, humbugger   Eats out - once week   Exercise: work around the house bit no exercise regimen   Family History:  Father: died MI at age of 46  Brother: died at age of MI at age of 30  Other brother: bypass still alive age 17 Sister: heart valve problem Sister: died at age of 67 CHF  Social History:  Alcohol: none  Smoking: quit 20 years ago Recreational drug use: none   Labs: Lipid Panel     Component Value Date/Time   CHOL 215 (H) 06/07/2022 0953   TRIG 264 (H) 06/07/2022 0953   HDL 46 06/07/2022 0953   CHOLHDL 4.7 06/07/2022 0953   CHOLHDL 4.5 09/02/2021 1031   [place] 123 (H) 06/07/2022 0953   [place] 115 (H) 09/02/2021 1031   LABVLDL 33 (H) 06/07/2022 0953    Past Medical History:  Diagnosis Date   Adrenal  hemorrhage ([place]) 07/2014   Following shoulder surgery   Adrenal insufficiency, primary, hemorrhagic ([place]) 07/2014   due to bilateral adrenal hemorrhage   Antiphospholipid antibody positive 2016   cardiolipin ab positive, B2-glyco positive, lupus anticoagulant positive   Antiphospholipid syndrome (HCC)    Autoimmune hepatitis ([place])    [place]   Cellulitis 09/2018   septic shock, compartment syndrome s/p fasciotomy RLE at [facility]   CVA (cerebral infarction) 04/2015   3 small areas of infarct in both the cerebrum as well as the cerebellum -- , located with diplopia   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  One sister with valve disease.   GERD (gastroesophageal reflux disease)    H/O acute cholangitis [id number]   Complicated by septic shock, acute pancreatitis and acute choledocholithiasis;    Lupus anticoagulant with hemorrhagic disorder (HCC)    Previously on Xarelto. Now on warfarin   Overweight (BMI 25.0-29.9)    PSC (primary sclerosing cholangitis)  managed by Dr. 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     Calcium  Carb-Cholecalciferol (CALCIUM + VITAMIN D3 PO) Take by mouth as directed.     clonazePAM (KLONOPIN) 0.5 MG tablet Take 0.5 mg by mouth 2 (two) times daily as needed for anxiety.     doxycycline (VIBRAMYCIN) 100 MG capsule Take 1 capsule (100 mg total) by mouth 2 (two) times daily. 20 capsule 0   enoxaparin (LOVENOX) 80 MG/0.8ML injection Inject 0.8 mLs (80 mg total) into the skin every 12 (twelve) hours. 16 mL 0   ezetimibe (ZETIA) 10 MG tablet TAKE ONE TABLET BY MOUTH DAILY 90 tablet 3   ferrous sulfate 325 (65 FE) MG tablet Take 325 mg by mouth daily with breakfast.     fludrocortisone (FLORINEF) 0.1 MG tablet Take 0.1 mg by mouth daily.     fluticasone (FLONASE) 50 MCG/ACT nasal spray Place 2 sprays into both nostrils daily.     gabapentin (NEURONTIN) 300 MG capsule TAKE 1 CAPSULE BY MOUTH 3 TIMES A DAY MAY INCREASE TO UP TO 6 CAPSULES A DAY 180 capsule 4   levothyroxine (SYNTHROID) 75 MCG tablet Take 1 tablet (75 mcg total) by mouth daily before breakfast. Alternate taking 75 mcg  ( whole tablet)  with 37.5 mcg ( 1/2 tablet) by mouth every other day 90 tablet 3   magnesium oxide (MAG-OX) 400 MG tablet Take 400 mg by mouth daily.     metFORMIN (GLUCOPHAGE-XR) 500 MG 24 hr tablet Take 1 tablet by mouth 2 (two) times daily.     metoprolol succinate (TOPROL-XL) 50 MG 24 hr tablet TAKE 1 TABLET BY MOUTH DAILY WITH OR IMMEDIATELY FOLLOWING A MEAL 30 tablet 6   mometasone (NASONEX) 50 MCG/ACT nasal spray Place 2 sprays into the nose daily as needed.     pantoprazole (PROTONIX) 40 MG tablet TAKE 1 TABLET BY MOUTH TWICE A DAY 180 tablet 1   predniSONE (DELTASONE) 1 MG tablet Take 8mg  PO Q AM and 2.5mg  PO Q PM 300 tablet 3   predniSONE (DELTASONE) 5 MG tablet Take 1 tablet (5 mg total) by mouth in the morning and at bedtime. 60 tablet 0   Probiotic Product (PROBIOTIC DAILY PO) Take by mouth.     sertraline (ZOLOFT) 50 MG tablet Take 1 tablet (50 mg total) by mouth daily. 30 tablet 11   ursodiol  (ACTIGALL) 500 MG tablet Take 1 tablet (500 mg total) by mouth 2 (two) times daily. 180 tablet 3   warfarin (COUMADIN) 5 MG tablet TAKE ONE TO 1.5 TABLETS BY MOUTH DAILY AS DIRECTED BY COUMADIN CLINIC 120 tablet 1   No current facility-administered medications on file prior to visit.    Allergies  Allergen Reactions   Biaxin [Clarithromycin] Rash    SJS  Keflex [Cephalexin] Hives, Itching and Rash   Macrolides And Ketolides Other (See Comments)    Unknown   Erythromycin     Was told not to take d/t medical problems   Penicillins     REACTION: rash    Assessment/Plan:  1. Hyperlipidemia -  Problem  Dyslipidemia   Current Medications: Zetia 10 mg daily  Intolerances: statins - elevated LFTs , muscle aches and joint pain  Risk Factors: hx of CVA LDL goal: < 55 mg/dl   Last LDLc 123, TC: 215, TG: 264, HDL: 46    Overweight (Bmi 25.0-29.9)   Dyslipidemia Assessment:  LDL goal: < 55 mg/dl last LDLc 123 mg/dl (  /20) Tolerates Zetia  well without any side effects  Intolerance to statins - muscle/ joints aches and LFTs elevation  Discussed next potential options (PCSK-9 inhibitors, bempedoic acid and inclisiran); cost, dosing efficacy, side effects  Patient has supplementary insurance so want to go for [employer]   Plan: Continue taking current medications Zetia 10 mg daily  Will send the Leqvio start form to [facility] support center will keep patient posted as we receive update  If cost will be barrier can can consider PCSK9i  Lipid lab due in 2-3 months after starting injectable therapy    Overweight (BMI 25.0-29.9) Assessment/plan: Patient last A1c 7.3 06/2021 while on metformin  Patient currently on 5 mg of prednisone  Given hx of stroke, strong family hx of premature CAD and HDL addition of GLP1 would be beneficial for secondary prevention  Patient reports he has 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

## 2022-07-12 ENCOUNTER — Encounter: Payer: Self-pay | Admitting: Pharmacist

## 2022-07-12 ENCOUNTER — Encounter: Payer: Self-pay | Admitting: Student

## 2022-07-12 ENCOUNTER — Ambulatory Visit: Payer: Medicare Other | Attending: Cardiology | Admitting: Student

## 2022-07-12 DIAGNOSIS — E663 Overweight: Secondary | ICD-10-CM

## 2022-07-12 DIAGNOSIS — E785 Hyperlipidemia, unspecified: Secondary | ICD-10-CM | POA: Insufficient documentation

## 2022-07-12 NOTE — Telephone Encounter (Signed)
error 

## 2022-07-12 NOTE — Assessment & Plan Note (Signed)
Assessment:  LDL goal: < 55 mg/dl last LDLc 123 mg/dl (  /20) Tolerates Zetia  well without any side effects  Intolerance to statins - muscle/ joints aches and LFTs elevation  Discussed next potential options (PCSK-9 inhibitors, bempedoic acid and inclisiran); cost, dosing efficacy, side effects  Patient has supplementary insurance so want to go for [employer]   Plan: Continue taking current medications Zetia 10 mg daily  Will send the Leqvio start form to [facility] support center will keep patient posted as we receive update  If cost will be barrier can can consider PCSK9i  Lipid lab due in 2-3 months after starting injectable therapy

## 2022-07-12 NOTE — Telephone Encounter (Signed)
Error

## 2022-07-12 NOTE — Patient Instructions (Signed)
Your Results:             Your most recent labs Goal  Total Cholesterol 264 < 200  Triglycerides 215 < 150  HDL (happy/good cholesterol) 46  > 40  LDL (lousy/bad cholesterol 123 < 55   Medication changes: We will start the process to get Leqvio  covered by your insurance.  Once the prior authorization is complete, we will call you to let you know and confirm pharmacy information.

## 2022-07-12 NOTE — Assessment & Plan Note (Addendum)
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

## 2022-07-26 ENCOUNTER — Telehealth: Payer: Self-pay | Admitting: Pharmacist

## 2022-07-26 ENCOUNTER — Other Ambulatory Visit: Payer: Self-pay | Admitting: Pharmacist

## 2022-07-26 DIAGNOSIS — E785 Hyperlipidemia, unspecified: Secondary | ICD-10-CM

## 2022-07-26 NOTE — Progress Notes (Signed)
Inclisiran ordered.

## 2022-07-26 NOTE — Telephone Encounter (Signed)
Inclisiran ordered for the patient

## 2022-07-28 NOTE — Pre-Procedure Instructions (Addendum)
Surgical Instructions    Your procedure is scheduled on Friday, August 04, 2022 at 1:30 PM.  [facility] Main Entrance "A" at 11:30 A.M., then check in with the Admitting office.  Call this number if you have problems the morning of surgery:  [phone]   If you have any questions prior to your surgery date call [phone]: Open Monday-Friday 8am-4pm  *If you experience any cold or flu symptoms such as cough, fever, chills, shortness of breath, etc. between now and your scheduled surgery, please notify us.*    Remember:  Do not eat or drink after midnight the night before your surgery    Take these medicines the morning of surgery with A SIP OF WATER:  ezetimibe (ZETIA)  fludrocortisone (FLORINEF)  fluticasone (FLONASE)  levothyroxine (SYNTHROID)  metoprolol succinate (TOPROL-XL)  pantoprazole (PROTONIX)  predniSONE (DELTASONE)  sertraline (ZOLOFT)  ursodiol (ACTIGALL)   IF NEEDED: clonazePAM (KLONOPIN)  guaiFENesin (MUCINEX)  mometasone (NASONEX)   Warfarin/Lovenox Instructions:  4/6:  Take last dose of Warfarin 4/7: No Warfarin or Lovenox 4/8 - 4/10: Start Lovenox twice per day (8am & 8pm); (No Warfarin) 4/11: Take 8am dose of Lovenox; Do not take 8pm dose. (No Warfarin) 4/12: Do not take lovenox; Resume Warfarin in the evening or as directed by your doctor.   Follow your surgeon's instructions on when to stop Aspirin.  If no instructions were given by your surgeon then you will need to call the office to get those instructions.     As of today, STOP taking any Aleve, Naproxen, Ibuprofen, Motrin, Advil, Goody's, BC's, all herbal medications, fish oil, and all vitamins.  WHAT DO I DO ABOUT MY DIABETES MEDICATION?   Do not take metFORMIN (GLUCOPHAGE-XR) the morning of surgery.   HOW TO MANAGE YOUR DIABETES BEFORE AND AFTER SURGERY  Why is it important to control my blood sugar before and after surgery? Improving blood sugar levels before and  after surgery helps healing and can limit problems. A way of improving blood sugar control is eating a healthy diet by:  Eating less sugar and carbohydrates  Increasing activity/exercise  Talking with your doctor about reaching your blood sugar goals High blood sugars (greater than 180 mg/dL) can raise your risk of infections and slow your recovery, so you will need to focus on controlling your diabetes during the weeks before surgery. Make sure that the doctor who takes care of your diabetes knows about your planned surgery including the date and location.  How do I manage my blood sugar before surgery? Check your blood sugar at least 4 times a day, starting 2 days before surgery, to make sure that the level is not too high or low.  Check your blood sugar the morning of your surgery when you wake up and every 2 hours until you get to the Short Stay unit.  If your blood sugar is less than 70 mg/dL, you will need to treat for low blood sugar: Do not take insulin. Treat a low blood sugar (less than 70 mg/dL) with  cup of clear juice (cranberry or apple), 4 glucose tablets, OR glucose gel. Recheck blood sugar in 15 minutes after treatment (to make sure it is greater than 70 mg/dL). If your blood sugar is not greater than 70 mg/dL on recheck, call [phone] for further instructions. Report your blood sugar to the short stay nurse when you get to Short Stay.  If you are admitted to the hospital after surgery: Your blood sugar  will be checked by the staff and you will probably be given insulin after surgery (instead of oral diabetes medicines) to make sure you have good blood sugar levels. The goal for blood sugar control after surgery is 80-180 mg/dL.                      Do NOT Smoke (Tobacco/Vaping) for 24 hours prior to your procedure.  If you use a CPAP at night, you may bring your mask/headgear for your overnight stay.   Contacts, glasses, piercing's, hearing aid's, dentures or  partials may not be worn into surgery, please bring cases for these belongings.    For patients admitted to the hospital, discharge time will be determined by your treatment team.   Patients discharged the day of surgery will not be allowed to drive home, and someone needs to stay with them for 24 hours.  SURGICAL WAITING ROOM VISITATION Patients having surgery or a procedure may have 2 support people in the waiting area. Visitors may stay in the waiting area during the procedure and switch out with other visitors if needed. Only 1 support person is allowed in the pre-op area with the patient AFTER the patient is prepped. This person cannot be switched out.  Children under the age of 41 must have an adult accompany them who is not the patient. If the patient needs to stay at the hospital during part of their recovery, the visitor guidelines for inpatient rooms apply.  Please refer to the [facility] website for the visitor guidelines for Inpatients (after your surgery is over and you are in a regular room).    Special instructions:   [place]- Preparing For Surgery  Before surgery, you can play an important role. Because skin is not sterile, your skin needs to be as free of germs as possible. You can reduce the number of germs on your skin by washing with CHG (chlorahexidine gluconate) Soap before surgery.  CHG is an antiseptic cleaner which kills germs and bonds with the skin to continue killing germs even after washing.    Oral Hygiene is also important to reduce your risk of infection.  Remember - BRUSH YOUR TEETH THE MORNING OF SURGERY WITH YOUR REGULAR TOOTHPASTE  Please do not use if you have an allergy to CHG or antibacterial soaps. If your skin becomes reddened/irritated stop using the CHG.  Do not shave (including legs and underarms) for at least 48 hours prior to first CHG shower. It is OK to shave your face.  Please follow these instructions carefully.   Shower the NIGHT  BEFORE SURGERY and the MORNING OF SURGERY  If you chose to wash your hair, wash your hair first as usual with your normal shampoo.  After you shampoo, rinse your hair and body thoroughly to remove the shampoo.  Use CHG Soap as you would any other liquid soap. You can apply CHG directly to the skin and wash gently with a scrungie or a clean washcloth.   Apply the CHG Soap to your body ONLY FROM THE NECK DOWN (neck, arms, chest, abdomen, legs, and back).  Do not use on open wounds or open sores. Avoid contact with your eyes, ears, mouth and genitals (private parts). Wash Face and genitals (private parts)  with your normal soap.   Wash thoroughly, paying special attention to the area where your surgery will be performed.  Thoroughly rinse your body with warm water from the neck down.  DO NOT shower/wash  with your normal soap after using and rinsing off the CHG Soap.  Pat yourself dry with a CLEAN TOWEL.  Wear CLEAN PAJAMAS to bed the night before surgery  Place CLEAN SHEETS on your bed the night before your surgery  DO NOT SLEEP WITH PETS.   Day of Surgery: Take a shower with CHG soap. Do not wear lotions, powders, perfumes/colognes, or deodorant. Do not wear jewelry. Do not shave 48 hours prior to surgery.  Men may shave face and neck. Wear Clean/Comfortable clothing the morning of surgery Do not bring valuables to the hospital.  [facility] is not responsible for any belongings or valuables. Remember to brush your teeth WITH YOUR REGULAR TOOTHPASTE.   Please read over the following fact sheets that you were given.  If you received a COVID test during your pre-op visit  it is requested that you wear a mask when out in public, stay away from anyone that may not be feeling well and notify your surgeon if you develop symptoms. If you have been in contact with anyone that has tested positive in the last 10 days please notify you surgeon.

## 2022-07-31 ENCOUNTER — Other Ambulatory Visit: Payer: Self-pay

## 2022-07-31 ENCOUNTER — Encounter (HOSPITAL_COMMUNITY)
Admission: RE | Admit: 2022-07-31 | Discharge: 2022-07-31 | Disposition: A | Discharge status: Home / Self Care | Payer: Medicare Other | Source: Ambulatory Visit | Attending: Otolaryngology | Admitting: Otolaryngology

## 2022-07-31 ENCOUNTER — Encounter (HOSPITAL_COMMUNITY): Payer: Self-pay

## 2022-07-31 VITALS — BP 133/85 | HR 79 | Temp 97.9°F | Resp 18 | Ht 69.0 in | Wt 183.4 lb

## 2022-07-31 DIAGNOSIS — K8301 Primary sclerosing cholangitis: Secondary | ICD-10-CM | POA: Insufficient documentation

## 2022-07-31 DIAGNOSIS — I10 Essential (primary) hypertension: Secondary | ICD-10-CM | POA: Insufficient documentation

## 2022-07-31 DIAGNOSIS — J322 Chronic ethmoidal sinusitis: Secondary | ICD-10-CM | POA: Insufficient documentation

## 2022-07-31 DIAGNOSIS — K754 Autoimmune hepatitis: Secondary | ICD-10-CM | POA: Insufficient documentation

## 2022-07-31 DIAGNOSIS — J320 Chronic maxillary sinusitis: Secondary | ICD-10-CM | POA: Insufficient documentation

## 2022-07-31 DIAGNOSIS — E119 Type 2 diabetes mellitus without complications: Secondary | ICD-10-CM | POA: Diagnosis not present

## 2022-07-31 DIAGNOSIS — Z8673 Personal history of transient ischemic attack (TIA), and cerebral infarction without residual deficits: Secondary | ICD-10-CM | POA: Diagnosis not present

## 2022-07-31 DIAGNOSIS — Z01818 Encounter for other preprocedural examination: Secondary | ICD-10-CM

## 2022-07-31 DIAGNOSIS — J342 Deviated nasal septum: Secondary | ICD-10-CM | POA: Insufficient documentation

## 2022-07-31 DIAGNOSIS — J343 Hypertrophy of nasal turbinates: Secondary | ICD-10-CM | POA: Diagnosis not present

## 2022-07-31 DIAGNOSIS — Z7984 Long term (current) use of oral hypoglycemic drugs: Secondary | ICD-10-CM | POA: Diagnosis not present

## 2022-07-31 DIAGNOSIS — Z01812 Encounter for preprocedural laboratory examination: Secondary | ICD-10-CM | POA: Insufficient documentation

## 2022-07-31 DIAGNOSIS — D68312 Antiphospholipid antibody with hemorrhagic disorder: Secondary | ICD-10-CM | POA: Insufficient documentation

## 2022-07-31 LAB — CBC
HCT: 44.3 % (ref 39.0–52.0)
Hemoglobin: 15.1 g/dL (ref 13.0–17.0)
MCH: 30.3 pg (ref 26.0–34.0)
MCHC: 34.1 g/dL (ref 30.0–36.0)
MCV: 88.8 fL (ref 80.0–100.0)
Platelets: 204 10*3/uL (ref 150–400)
RBC: 4.99 MIL/uL (ref 4.22–5.81)
RDW: 13.4 % (ref 11.5–15.5)
WBC: 7.8 10*3/uL (ref 4.0–10.5)
nRBC: 0 % (ref 0.0–0.2)

## 2022-07-31 LAB — COMPREHENSIVE METABOLIC PANEL
ALT: 31 U/L (ref 0–44)
AST: 29 U/L (ref 15–41)
Albumin: 3.6 g/dL (ref 3.5–5.0)
Alkaline Phosphatase: 78 U/L (ref 38–126)
Anion gap: 9 (ref 5–15)
BUN: 25 mg/dL — ABNORMAL HIGH (ref 8–23)
CO2: 23 mmol/L (ref 22–32)
Calcium: 9.1 mg/dL (ref 8.9–10.3)
Chloride: 101 mmol/L (ref 98–111)
Creatinine, Ser: 1.11 mg/dL (ref 0.61–1.24)
GFR, Estimated: >60 mL/min (ref >60)
Glucose, Bld: 197 mg/dL — ABNORMAL HIGH (ref 70–99)
Potassium: 4.3 mmol/L (ref 3.5–5.1)
Sodium: 133 mmol/L — ABNORMAL LOW (ref 135–145)
Total Bilirubin: 1.2 mg/dL (ref 0.3–1.2)
Total Protein: 6.9 g/dL (ref 6.5–8.1)

## 2022-07-31 LAB — HEMOGLOBIN A1C
Hgb A1c MFr Bld: 7.6 % — ABNORMAL HIGH (ref 4.8–5.6)
Mean Plasma Glucose: 171.42 mg/dL

## 2022-07-31 LAB — GLUCOSE, CAPILLARY: Glucose-Capillary: 242 mg/dL — ABNORMAL HIGH (ref 70–99)

## 2022-07-31 MED ORDER — URSODIOL 500 MG PO TABS: 500.0000 mg | ORAL_TABLET | Freq: Two times a day (BID) | ORAL | 1 refills | Status: DC

## 2022-07-31 NOTE — Telephone Encounter (Signed)
Prescription Request  07/31/2022  LOV: 02/03/22  What is the name of the medication or equipment? ursodiol (ACTIGALL) 500 MG tablet [[id number]]  Have you contacted your pharmacy to request a refill? Yes   Which pharmacy would you like this sent to?  [id number] [place], [place] - [address] [place] [address] Phone: [phone] Fax: [phone]    Patient notified that their request is being sent to the clinical staff for review and that they should receive a response within 2 business days.   Please advise at [facility] [phone]

## 2022-07-31 NOTE — Telephone Encounter (Signed)
Requested Prescriptions  Pending Prescriptions Disp Refills   ursodiol (ACTIGALL) 500 MG tablet 180 tablet 1    Sig: Take 1 tablet (500 mg total) by mouth 2 (two) times daily.     Gastroenterology:  Adrian Blackwater Agents Passed - 07/31/2022 11:03 AM      Passed - ALT in normal range and within 180 days    ALT  Date Value Ref Range Status  06/07/2022 25 0 - 44 IU/L Final         Passed - AST in normal range and within 180 days    AST  Date Value Ref Range Status  06/07/2022 22 0 - 40 IU/L Final         Passed - Valid encounter within last 12 months    Recent Outpatient Visits           11 months ago History of CVA (cerebrovascular accident)   [facility] Donita Brooks, MD   1 year ago Bacterial sinusitis   [facility] Valentino Nose, NP   1 year ago Septic olecranon bursitis of left elbow   [facility] Tanya Nones, Priscille Heidelberg, MD   1 year ago Left elbow pain   [facility] Valentino Nose, NP   1 year ago Irregular heart beats   [facility] Pickard, Priscille Heidelberg, MD

## 2022-07-31 NOTE — Progress Notes (Signed)
PCP - Dr.Warren Pickard Cardiologist - Dr.David Herbie [place]  PPM/ICD - pt denies Device Orders - n/a Rep Notified - n/a  Chest x-ray - 2016 EKG - 06/07/22 Stress Test - 3/6//08 ECHO - 08/17/14 Cardiac Cath - pt denies  Sleep Study - pt denies CPAP - n/a  Fasting Blood Sugar - pt denies checking his blood sugar at home. He states last time he checked blood sugar at home was about 1 year ago. Patient states he does have CBG meter so I encouraged patient to check his blood sugar day before surgery and day of sugary per our pre-op instructions.  Checks Blood Sugar zero times a day  Last dose of GLP1 agonist-  pt denies GLP1 instructions: n/a  Blood Thinner Instructions:Warfarin/Lovenox Instructions:   4/6:  Take last dose of Warfarin 4/7: No Warfarin or Lovenox 4/8 - 4/10: Start Lovenox twice per day (8am & 8pm); (No Warfarin) 4/11: Take 8am dose of Lovenox; Do not take 8pm dose. (No Warfarin) 4/12: Do not take lovenox; Resume Warfarin in the evening or as directed by your do Aspirin Instructions:Follow your surgeon's instructions on when to stop Aspirin.  If no instructions were given by your surgeon then you will need to call the office to get those instructions.     NPO after Midnight  COVID TEST- n/a   Anesthesia review: YES, complicated medical history. History CVA 2017. Patient on Warfarin. Blood sugar was 242 at PAT visit today. Patient states he ate cereal (Raisin Bran) with 2% milk. Patient is currently taking Prednisone.     Patient denies shortness of breath, fever, cough and chest pain at PAT appointment   All instructions explained to the patient, with a verbal understanding of the material. Patient agrees to go over the instructions while at home for a better understanding. Patient also instructed to self quarantine after being tested for COVID-19. The opportunity to ask questions was provided.

## 2022-08-01 ENCOUNTER — Other Ambulatory Visit (HOSPITAL_COMMUNITY): Payer: Medicare Other

## 2022-08-01 NOTE — Anesthesia Preprocedure Evaluation (Addendum)
Anesthesia Evaluation  Patient identified by MRN, date of birth, ID band Patient awake    Reviewed: Allergy & Precautions, H&P , NPO status , Patient's Chart, lab work & pertinent test results  Airway Mallampati: II  TM Distance: >3 FB Neck ROM: Full    Dental no notable dental hx.    Pulmonary neg pulmonary ROS, former smoker   Pulmonary exam normal breath sounds clear to auscultation       Cardiovascular hypertension, Pt. on medications negative cardio ROS Normal cardiovascular exam Rhythm:Regular Rate:Normal     Neuro/Psych negative neurological ROS  negative psych ROS   GI/Hepatic Neg liver ROS,GERD  ,,  Endo/Other  diabetes, Type 2Hypothyroidism    Renal/GU negative Renal ROS  negative genitourinary   Musculoskeletal negative musculoskeletal ROS (+)    Abdominal   Peds negative pediatric ROS (+)  Hematology negative hematology ROS (+)   Anesthesia Other Findings   Reproductive/Obstetrics negative OB ROS                             Anesthesia Physical Anesthesia Plan  ASA: 3  Anesthesia Plan: General   Post-op Pain Management:    Induction: Intravenous  PONV Risk Score and Plan: 2 and Ondansetron, Midazolam and Treatment may vary due to age or medical condition  Airway Management Planned: Oral ETT  Additional Equipment:   Intra-op Plan:   Post-operative Plan: Extubation in OR  Informed Consent: I have reviewed the patients History and Physical, chart, labs and discussed the procedure including the risks, benefits and alternatives for the proposed anesthesia with the patient or authorized representative who has indicated his/her understanding and acceptance.     Dental advisory given  Plan Discussed with: CRNA  Anesthesia Plan Comments: (See APP note by Joslyn Hy, FNP )       Anesthesia Quick Evaluation

## 2022-08-01 NOTE — Progress Notes (Addendum)
Anesthesia Chart Review:   Case: [id number] Date/Time: 08/04/22 1315   Procedures:      NASAL SEPTOPLASTY WITH BILATERAL TURBINATE REDUCTION (Bilateral)     BILATERAL ENDOSCOPIC ETHMOIDECTOMY (Bilateral)     BILATERAL ENDOSCOPIC MAXILLARY ANTROSTOMY WITH TISSUE REMOVAL (Bilateral)   Anesthesia type: General   Pre-op diagnosis:      DEVIATED SEPTUM     BILATERAL TURBINATE HYPERTROPHY     ETHMOID SINUSITIS      MAXILLARY SINUITIS   Location: [facility] ROOM 09 / [facility]   Surgeons: Newman Pies, MD       DISCUSSION: Pt is 67 years old with hx HTN, CVA, , frequent PACs (,managed with metoprolol), adrenal insufficency, DM, antiphospholipid antibody positive, lupus anticoagulant, antiphospholipid syndrome, cyclic neutropenia (s/p bone marrow transplant), autoimmune hepatitis (primary sclerosing cholangitis), Levonne Spiller (reaction to Biaxin)  Last dose coumadin 07/29/22. Last dose Lovenox 8am 08/03/22   VS: BP 133/85   Pulse 79   Temp 36.6 C   Resp 18   Ht 5\' 9"  (1.753 m)   Wt 83.2 kg   SpO2 100%   BMI 27.08 kg/m   PROVIDERS: - PCP is Donita Brooks, MD - Cardiologist is Bryan Lemma, MD. Cleared for surgery at last office visit 06/07/22 with Micah Flesher, PA - Hepatology care at [facility] (notes in care everywhere)    LABS: Labs reviewed: Acceptable for surgery. (all labs ordered are listed, but only abnormal results are displayed)  Labs Reviewed  GLUCOSE, CAPILLARY - Abnormal; Notable for the following components:      Result Value   Glucose-Capillary 242 (*)    All other components within normal limits  COMPREHENSIVE METABOLIC PANEL - Abnormal; Notable for the following components:   Sodium 133 (*)    Glucose, Bld 197 (*)    BUN 25 (*)    All other components within normal limits  HEMOGLOBIN A1C - Abnormal; Notable for the following components:   Hgb A1c MFr Bld 7.6 (*)    All other components within normal limits  CBC    EKG 06/07/22: sinus rhythm with HR 66, PACs     CV: Cardiac monitor 12/23/20:  Predominant rhythm is sinus rhythm with a heart rate range of 53-135 bpm. Average rate 78 bpm. They were very frequent PACs (34%) with some bigeminy and trigeminy Total of 52 short bursts of likely PAT (paroxysmal atrial tachycardia: Longest 7 beats at an average rate 130 bpm, fastest was 4 beats at 167 bpm. Also occasional PVCs noted-with at least 1 symptomatic episode being ventricular bigeminy. Rare PVC triplets noted but no couplets. No sustained arrhythmias noted. No sustained bradycardia noted  Echo 08/17/14:  - Left ventricle: The cavity size was normal. Wall thickness was increased in a pattern of mild LVH. Systolic function was normal. The estimated ejection fraction was in the range of 60% to 65%. Wall motion was normal; there were no regional wall motion abnormalities. Doppler parameters are consistent with abnormal left ventricular relaxation (grade 1 diastolic dysfunction).  - Impressions: Normal LV function; mild LVH; grade 1 diastolic dysfunction; trace TR.    Past Medical History:  Diagnosis Date   Adrenal hemorrhage 07/2014   Following shoulder surgery   Adrenal insufficiency, primary, hemorrhagic 07/2014   due to bilateral adrenal hemorrhage   Antiphospholipid antibody positive 2016   cardiolipin ab positive, B2-glyco positive, lupus anticoagulant positive   Antiphospholipid syndrome    Autoimmune hepatitis    PSC   Cellulitis 09/2018   septic shock,  compartment syndrome [facility]   CVA (cerebral infarction) 04/2015   3 small areas of infarct in both the cerebrum as well as the cerebellum -- , located with diplopia   Cyclic neutropenia    Status post bone marrow transplant   Dyslipidemia    Erectile dysfunction    Essential hypertension    Family history of premature coronary artery disease    2 brothers, one sister and father all with CAD.  One sister with valve disease.   GERD (gastroesophageal reflux  disease)    H/O acute cholangitis 08/2014   Complicated by septic shock, acute pancreatitis and acute choledocholithiasis;    Lupus anticoagulant with hemorrhagic disorder    Previously on Xarelto. Now on warfarin   Overweight (BMI 25.0-29.9)    PSC (primary sclerosing cholangitis)    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 Likely cholangitis   ERCP W/ PLASTIC STENT PLACEMENT  Sep 09 2014   [facility] Dr. Benedetto GoadJohn Evans   ERCP W/ SPHINCTEROTOMY AND BALLOON DILATION  11/10/2014   [facility] -- Removal of biliary stent,, to PACU duodenitis   LAPAROSCOPIC CHOLECYSTECTOMY W/ CHOLANGIOGRAPHY  November 13 2014   [facility], Dr. Sylvie FarrierAshley Marie Fontenot   NM MYOCAR PERF WALL MOTION  06/28/2006   EF 70% , LV systolic fx norm.   ROTATOR CUFF REPAIR     TRANSTHORACIC ECHOCARDIOGRAM  04/2015   No pericardial effusion. No shunt. Full study: Basal septal LVH. Pseudo-normal filling (GR 2 DD). EF 57%.   TRANSTHORACIC ECHOCARDIOGRAM  08/2015   [facility] Normal LV size. Basal septal hypertrophy. EF 57%. Pseudo-normal filling. Mild focal aortic valve thickening. (Study was done to evaluate for possible endocarditis)    MEDICATIONS:   aspirin EC 81 MG tablet   Calcium Carb-Cholecalciferol (CALCIUM + VITAMIN D3 PO)   clonazePAM (KLONOPIN) 0.5 MG tablet   enoxaparin (LOVENOX) 80 MG/0.8ML injection   ezetimibe (ZETIA) 10 MG tablet   ferrous sulfate 325 (65 FE) MG tablet   fludrocortisone (FLORINEF) 0.1 MG tablet   fluticasone (FLONASE) 50 MCG/ACT nasal spray   gabapentin (NEURONTIN) 300 MG capsule   guaiFENesin (MUCINEX) 600 MG 12 hr tablet   levothyroxine (SYNTHROID) 75 MCG tablet   magnesium oxide (MAG-OX) 400 MG tablet   metFORMIN (GLUCOPHAGE-XR) 500 MG 24 hr tablet   metoprolol succinate (TOPROL-XL) 50 MG 24 hr tablet   mometasone (NASONEX) 50 MCG/ACT nasal spray   pantoprazole (PROTONIX) 40 MG tablet   predniSONE (DELTASONE) 1 MG tablet   predniSONE (DELTASONE) 5 MG tablet   Probiotic Product (PROBIOTIC DAILY PO)   sertraline (ZOLOFT) 50 MG tablet   ursodiol (ACTIGALL) 500  MG tablet   warfarin (COUMADIN) 5 MG tablet   No current facility-administered medications for this encounter.    If no changes, I anticipate pt can proceed with surgery as scheduled.   Rica Mast, PhD, FNP-BC [facility]/Anesthesiology Phone: [phone] 08/01/2022 3:26 PM

## 2022-08-04 ENCOUNTER — Ambulatory Visit (HOSPITAL_COMMUNITY): Payer: Medicare Other | Admitting: Emergency Medicine

## 2022-08-04 ENCOUNTER — Ambulatory Visit (HOSPITAL_COMMUNITY)
Admission: RE | Admit: 2022-08-04 | Discharge: 2022-08-04 | Disposition: A | Discharge status: Home / Self Care | Payer: Medicare Other | Source: Ambulatory Visit | Attending: Otolaryngology | Admitting: Otolaryngology

## 2022-08-04 ENCOUNTER — Other Ambulatory Visit: Payer: Self-pay

## 2022-08-04 ENCOUNTER — Encounter (HOSPITAL_COMMUNITY)
Admission: RE | Disposition: A | Discharge status: Home / Self Care | Payer: Self-pay | Source: Ambulatory Visit | Attending: Otolaryngology

## 2022-08-04 ENCOUNTER — Ambulatory Visit (HOSPITAL_BASED_OUTPATIENT_CLINIC_OR_DEPARTMENT_OTHER): Payer: Medicare Other | Admitting: Anesthesiology

## 2022-08-04 ENCOUNTER — Encounter (HOSPITAL_COMMUNITY): Payer: Self-pay | Admitting: Otolaryngology

## 2022-08-04 DIAGNOSIS — J343 Hypertrophy of nasal turbinates: Secondary | ICD-10-CM | POA: Insufficient documentation

## 2022-08-04 DIAGNOSIS — J3489 Other specified disorders of nose and nasal sinuses: Secondary | ICD-10-CM

## 2022-08-04 DIAGNOSIS — J320 Chronic maxillary sinusitis: Secondary | ICD-10-CM | POA: Diagnosis not present

## 2022-08-04 DIAGNOSIS — J342 Deviated nasal septum: Secondary | ICD-10-CM | POA: Diagnosis present

## 2022-08-04 DIAGNOSIS — I10 Essential (primary) hypertension: Secondary | ICD-10-CM | POA: Diagnosis not present

## 2022-08-04 DIAGNOSIS — E119 Type 2 diabetes mellitus without complications: Secondary | ICD-10-CM | POA: Insufficient documentation

## 2022-08-04 DIAGNOSIS — E039 Hypothyroidism, unspecified: Secondary | ICD-10-CM | POA: Insufficient documentation

## 2022-08-04 DIAGNOSIS — K219 Gastro-esophageal reflux disease without esophagitis: Secondary | ICD-10-CM | POA: Diagnosis not present

## 2022-08-04 DIAGNOSIS — J338 Other polyp of sinus: Secondary | ICD-10-CM | POA: Insufficient documentation

## 2022-08-04 DIAGNOSIS — Z87891 Personal history of nicotine dependence: Secondary | ICD-10-CM | POA: Insufficient documentation

## 2022-08-04 DIAGNOSIS — J322 Chronic ethmoidal sinusitis: Secondary | ICD-10-CM | POA: Diagnosis not present

## 2022-08-04 DIAGNOSIS — J328 Other chronic sinusitis: Secondary | ICD-10-CM | POA: Diagnosis not present

## 2022-08-04 DIAGNOSIS — J339 Nasal polyp, unspecified: Secondary | ICD-10-CM

## 2022-08-04 HISTORY — PX: NASAL SEPTOPLASTY W/ TURBINOPLASTY: SHX2070

## 2022-08-04 HISTORY — PX: ETHMOIDECTOMY: SHX5197

## 2022-08-04 HISTORY — PX: MAXILLARY ANTROSTOMY: SHX2003

## 2022-08-04 LAB — PROTIME-INR
INR: 1 (ref 0.8–1.2)
Prothrombin Time: 13.6 seconds (ref 11.4–15.2)

## 2022-08-04 LAB — GLUCOSE, CAPILLARY
Glucose-Capillary: 137 mg/dL — ABNORMAL HIGH (ref 70–99)
Glucose-Capillary: 193 mg/dL — ABNORMAL HIGH (ref 70–99)

## 2022-08-04 SURGERY — SEPTOPLASTY, NOSE, WITH NASAL TURBINATE REDUCTION
Anesthesia: General | Site: Nose | Laterality: Bilateral

## 2022-08-04 MED ORDER — EPHEDRINE SULFATE-NACL 50-0.9 MG/10ML-% IV SOSY
PREFILLED_SYRINGE | INTRAVENOUS | Status: DC | PRN
  Administered 2022-08-04 (×2): 5 mg via INTRAVENOUS

## 2022-08-04 MED ORDER — PROPOFOL 10 MG/ML IV BOLUS
INTRAVENOUS | Status: DC | PRN
  Administered 2022-08-04: 150 mg via INTRAVENOUS

## 2022-08-04 MED ORDER — ONDANSETRON HCL 4 MG/2ML IJ SOLN
INTRAMUSCULAR | Status: DC | PRN
  Administered 2022-08-04: 4 mg via INTRAVENOUS

## 2022-08-04 MED ORDER — PHENYLEPHRINE 80 MCG/ML (10ML) SYRINGE FOR IV PUSH (FOR BLOOD PRESSURE SUPPORT)
PREFILLED_SYRINGE | INTRAVENOUS | Status: DC | PRN
  Administered 2022-08-04: 40 ug via INTRAVENOUS

## 2022-08-04 MED ORDER — DEXAMETHASONE SODIUM PHOSPHATE 10 MG/ML IJ SOLN
INTRAMUSCULAR | Status: DC | PRN
  Administered 2022-08-04: 10 mg via INTRAVENOUS

## 2022-08-04 MED ORDER — BACITRACIN-NEOMYCIN-POLYMYXIN OINTMENT TUBE
TOPICAL_OINTMENT | CUTANEOUS | Status: AC
  Filled 2022-08-04: qty 14.17

## 2022-08-04 MED ORDER — LACTATED RINGERS IV SOLN: INTRAVENOUS | Status: DC

## 2022-08-04 MED ORDER — BACITRACIN ZINC 500 UNIT/GM EX OINT
TOPICAL_OINTMENT | CUTANEOUS | Status: AC
  Filled 2022-08-04: qty 28.35

## 2022-08-04 MED ORDER — HYDROMORPHONE HCL 1 MG/ML IJ SOLN: 0.2500 mg | INTRAMUSCULAR | Status: DC | PRN

## 2022-08-04 MED ORDER — MIDAZOLAM HCL 2 MG/2ML IJ SOLN
INTRAMUSCULAR | Status: AC
  Filled 2022-08-04: qty 2

## 2022-08-04 MED ORDER — OXYCODONE HCL 5 MG PO TABS: 5.0000 mg | ORAL_TABLET | Freq: Once | ORAL | Status: AC | PRN

## 2022-08-04 MED ORDER — MUPIROCIN 2 % EX OINT
TOPICAL_OINTMENT | CUTANEOUS | Status: AC
  Filled 2022-08-04: qty 22

## 2022-08-04 MED ORDER — OXYMETAZOLINE HCL 0.05 % NA SOLN
NASAL | Status: DC | PRN
  Administered 2022-08-04: 1

## 2022-08-04 MED ORDER — OXYMETAZOLINE HCL 0.05 % NA SOLN
NASAL | Status: AC
  Filled 2022-08-04: qty 30

## 2022-08-04 MED ORDER — FENTANYL CITRATE (PF) 250 MCG/5ML IJ SOLN
INTRAMUSCULAR | Status: DC | PRN
  Administered 2022-08-04: 100 ug via INTRAVENOUS

## 2022-08-04 MED ORDER — PROPOFOL 10 MG/ML IV BOLUS
INTRAVENOUS | Status: AC
  Filled 2022-08-04: qty 20

## 2022-08-04 MED ORDER — LIDOCAINE-EPINEPHRINE 1 %-1:100000 IJ SOLN
INTRAMUSCULAR | Status: DC | PRN
  Administered 2022-08-04: 5.5 mL

## 2022-08-04 MED ORDER — CLINDAMYCIN HCL 300 MG PO CAPS: 300.0000 mg | ORAL_CAPSULE | Freq: Three times a day (TID) | ORAL | 0 refills | Status: AC

## 2022-08-04 MED ORDER — CLINDAMYCIN PHOSPHATE 900 MG/50ML IV SOLN
900.0000 mg | Freq: Once | INTRAVENOUS | Status: AC
  Administered 2022-08-04: 900 mg via INTRAVENOUS
  Filled 2022-08-04: qty 50

## 2022-08-04 MED ORDER — CHLORHEXIDINE GLUCONATE 0.12 % MT SOLN
15.0000 mL | Freq: Once | OROMUCOSAL | Status: AC
  Administered 2022-08-04: 15 mL via OROMUCOSAL
  Filled 2022-08-04: qty 15

## 2022-08-04 MED ORDER — LIDOCAINE 2% (20 MG/ML) 5 ML SYRINGE
INTRAMUSCULAR | Status: DC | PRN
  Administered 2022-08-04: 80 mg via INTRAVENOUS

## 2022-08-04 MED ORDER — MIDAZOLAM HCL 5 MG/5ML IJ SOLN
INTRAMUSCULAR | Status: DC | PRN
  Administered 2022-08-04: 2 mg via INTRAVENOUS

## 2022-08-04 MED ORDER — OXYCODONE-ACETAMINOPHEN 5-325 MG PO TABS: 1.0000 | ORAL_TABLET | ORAL | 0 refills | Status: AC | PRN

## 2022-08-04 MED ORDER — SUGAMMADEX SODIUM 200 MG/2ML IV SOLN
INTRAVENOUS | Status: DC | PRN
  Administered 2022-08-04: 200 mg via INTRAVENOUS

## 2022-08-04 MED ORDER — 0.9 % SODIUM CHLORIDE (POUR BTL) OPTIME
TOPICAL | Status: DC | PRN
  Administered 2022-08-04: 1000 mL

## 2022-08-04 MED ORDER — SODIUM CHLORIDE 0.9 % IR SOLN
Status: DC | PRN
  Administered 2022-08-04: 1000 mL

## 2022-08-04 MED ORDER — PROMETHAZINE HCL 25 MG/ML IJ SOLN: 6.2500 mg | INTRAMUSCULAR | Status: DC | PRN

## 2022-08-04 MED ORDER — MUPIROCIN 2 % EX OINT
TOPICAL_OINTMENT | CUTANEOUS | Status: DC | PRN
  Administered 2022-08-04: 1 via TOPICAL

## 2022-08-04 MED ORDER — ROCURONIUM BROMIDE 10 MG/ML (PF) SYRINGE
PREFILLED_SYRINGE | INTRAVENOUS | Status: DC | PRN
  Administered 2022-08-04: 20 mg via INTRAVENOUS
  Administered 2022-08-04: 80 mg via INTRAVENOUS

## 2022-08-04 MED ORDER — FENTANYL CITRATE (PF) 250 MCG/5ML IJ SOLN
INTRAMUSCULAR | Status: AC
  Filled 2022-08-04: qty 5

## 2022-08-04 MED ORDER — OXYCODONE HCL 5 MG PO TABS
ORAL_TABLET | ORAL | Status: AC
  Administered 2022-08-04: 5 mg via ORAL
  Filled 2022-08-04: qty 1

## 2022-08-04 MED ORDER — INSULIN ASPART 100 UNIT/ML IJ SOLN: 0.0000 [IU] | INTRAMUSCULAR | Status: DC | PRN

## 2022-08-04 MED ORDER — ORAL CARE MOUTH RINSE: 15.0000 mL | Freq: Once | OROMUCOSAL | Status: AC

## 2022-08-04 MED ORDER — OXYCODONE HCL 5 MG/5ML PO SOLN: 5.0000 mg | Freq: Once | ORAL | Status: AC | PRN

## 2022-08-04 MED ORDER — LIDOCAINE-EPINEPHRINE 1 %-1:100000 IJ SOLN
INTRAMUSCULAR | Status: AC
  Filled 2022-08-04: qty 1

## 2022-08-04 SURGICAL SUPPLY — 62 items
AGENT HMST KT MTR STRL THRMB (HEMOSTASIS)
BAG COUNTER SPONGE SURGICOUNT (BAG) ×1 IMPLANT
BAG SPNG CNTER NS LX DISP (BAG) ×1
BLADE NAVIG QUADCUT 4.3X13 M4 (BLADE) IMPLANT
BLADE RAD40 ROTATE 4M 4 5PK (BLADE) IMPLANT
BLADE RAD60 ROTATE M4 4 5PK (BLADE) IMPLANT
BLADE ROTATE RAD 40 4 M4 (BLADE) IMPLANT
BLADE ROTATE TRICUT 4X13 M4 (BLADE) ×1 IMPLANT
BLADE SURG 15 STRL LF DISP TIS (BLADE) IMPLANT
BLADE SURG 15 STRL SS (BLADE)
BLADE TRICUT ROTATE M4 4 5PK (BLADE) IMPLANT
CANISTER SUCT 3000ML PPV (MISCELLANEOUS) ×2 IMPLANT
COAGULATOR SUCT SWTCH 10FR 6 (ELECTROSURGICAL) ×1 IMPLANT
COVER SURGICAL LIGHT HANDLE (MISCELLANEOUS) IMPLANT
DRAPE HALF SHEET 40X57 (DRAPES) IMPLANT
DRESSING NASAL KENNEDY 3.5X.9 (MISCELLANEOUS) IMPLANT
DRSG NASAL KENNEDY 3.5X.9 (MISCELLANEOUS)
DRSG NASOPORE 8CM (GAUZE/BANDAGES/DRESSINGS) IMPLANT
ELECT REM PT RETURN 9FT ADLT (ELECTROSURGICAL) ×1
ELECTRODE REM PT RTRN 9FT ADLT (ELECTROSURGICAL) ×1 IMPLANT
FILTER ARTHROSCOPY CONVERTOR (FILTER) IMPLANT
GAUZE SPONGE 2X2 8PLY STRL LF (GAUZE/BANDAGES/DRESSINGS) ×1 IMPLANT
GLOVE ECLIPSE 7.5 STRL STRAW (GLOVE) ×1 IMPLANT
GOWN STRL REUS W/ TWL LRG LVL3 (GOWN DISPOSABLE) ×2 IMPLANT
GOWN STRL REUS W/TWL LRG LVL3 (GOWN DISPOSABLE) ×2
KIT BASIN OR (CUSTOM PROCEDURE TRAY) ×1 IMPLANT
KIT TURNOVER KIT B (KITS) ×1 IMPLANT
NDL HYPO 25GX1X1/2 BEV (NEEDLE) ×1 IMPLANT
NDL SPNL 25GX3.5 QUINCKE BL (NEEDLE) ×1 IMPLANT
NEEDLE HYPO 25GX1X1/2 BEV (NEEDLE) ×1 IMPLANT
NEEDLE SPNL 25GX3.5 QUINCKE BL (NEEDLE) ×1 IMPLANT
NS IRRIG 1000ML POUR BTL (IV SOLUTION) ×1 IMPLANT
PAD ARMBOARD 7.5X6 YLW CONV (MISCELLANEOUS) ×2 IMPLANT
PAD ENT ADHESIVE 25PK (MISCELLANEOUS) ×1 IMPLANT
PENCIL SMOKE EVACUATOR (MISCELLANEOUS) IMPLANT
SPECIMEN JAR SMALL (MISCELLANEOUS) ×2 IMPLANT
SPIKE FLUID TRANSFER (MISCELLANEOUS) ×1 IMPLANT
SPLINT NASAL DOYLE BI-VL (GAUZE/BANDAGES/DRESSINGS) ×1 IMPLANT
SPONGE NEURO XRAY DETECT 1X3 (DISPOSABLE) ×1 IMPLANT
SURGIFLO W/THROMBIN 8M KIT (HEMOSTASIS) IMPLANT
SUT CHROMIC 4 0 S 4 (SUTURE) ×1 IMPLANT
SUT CHROMIC 4 0 SH 27 (SUTURE) ×1 IMPLANT
SUT ETHILON 3 0 FSL (SUTURE) IMPLANT
SUT PLAIN 4 0 ~~LOC~~ 1 (SUTURE) ×1 IMPLANT
SUT PROLENE 2 0 FS (SUTURE) ×1 IMPLANT
SUT PROLENE 3 0 PS 2 (SUTURE) ×1 IMPLANT
SWAB COLLECTION DEVICE MRSA (MISCELLANEOUS) IMPLANT
SWAB CULTURE ESWAB REG 1ML (MISCELLANEOUS) IMPLANT
SYR 50ML LL SCALE MARK (SYRINGE) IMPLANT
SYR 5ML LL (SYRINGE) IMPLANT
SYR CONTROL 10ML LL (SYRINGE) IMPLANT
TOWEL GREEN STERILE FF (TOWEL DISPOSABLE) ×1 IMPLANT
TRACKER ENT INSTRUMENT (MISCELLANEOUS) ×1 IMPLANT
TRACKER ENT PATIENT (MISCELLANEOUS) ×1 IMPLANT
TRAP SPECIMEN MUCUS 40CC (MISCELLANEOUS) IMPLANT
TRAY ENT MC OR (CUSTOM PROCEDURE TRAY) ×1 IMPLANT
TUBE CONNECTING 12X1/4 (SUCTIONS) ×1 IMPLANT
TUBE SALEM SUMP 16F (TUBING) ×1 IMPLANT
TUBING EXTENTION W/L.L. (IV SETS) ×1 IMPLANT
TUBING STRAIGHTSHOT EPS 5PK (TUBING) ×1 IMPLANT
WATER STERILE IRR 1000ML POUR (IV SOLUTION) ×1 IMPLANT
WIPE INSTRUMENT VISIWIPE 73X73 (MISCELLANEOUS) ×1 IMPLANT

## 2022-08-04 NOTE — Op Note (Signed)
DATE OF PROCEDURE: 08/04/2022  OPERATIVE REPORT   SURGEON: Newman Pies, MD   PREOPERATIVE DIAGNOSES:  1. Severe nasal septal deviation.  2. Bilateral inferior turbinate hypertrophy.  3. Chronic nasal obstruction. 4.  Bilateral chronic maxillary/ethmoid sinusitis and polyposis  POSTOPERATIVE DIAGNOSES:  1. Severe nasal septal deviation.  2. Bilateral inferior turbinate hypertrophy.  3. Chronic nasal obstruction. 4.  Bilateral chronic maxillary/ethmoid sinusitis and polyposis  PROCEDURE PERFORMED:  1. Septoplasty.  2. Bilateral partial inferior turbinate resection.  3. Bilateral endoscopic total ethmoidectomies 4. Bilateral endoscopic maxillary antrostomy with tissue removal. 5. Fusion stereotactic image guidance  ANESTHESIA: General endotracheal tube anesthesia.   COMPLICATIONS: None.   ESTIMATED BLOOD LOSS: 300 mL.   INDICATION FOR PROCEDURE: Kenneth Cline is a 67 y.o. male with a history of chronic nasal obstruction and chronic rhinosinusitis. The patient was treated with multiple courses of antibiotics, antihistamine, decongestant, and steroid nasal sprays. However, the patient continued to be symptomatic. On examination, the patient was noted to have bilateral severe inferior turbinate hypertrophy and significant nasal septal deviation, causing significant nasal obstruction.  His CT scan also showed bilateral chronic maxillary and ethmoid sinusitis.  Based on the above findings, the decision was made for the patient to undergo the above-stated procedures. The risks, benefits, alternatives, and details of the procedures were discussed with the patient. Questions were invited and answered. Informed consent was obtained.   DESCRIPTION OF PROCEDURE: The patient was taken to the operating room and placed supine on the operating table. General endotracheal tube anesthesia was administered by the anesthesiologist. The patient was positioned, and prepped and draped in the standard fashion  for nasal surgery. Pledgets soaked with Afrin were placed in both nasal cavities for decongestion. The pledgets were subsequently removed. The FUSION stereotactic image guidance marker was placed. The image guidance system was functional throughout the case.  Examination of the nasal cavity revealed a severe nasal septal deviation. 1% lidocaine with 1:100,000 epinephrine was injected onto the nasal septum bilaterally. A hemitransfixion incision was made on the left side. The mucosal flap was carefully elevated on the left side. A cartilaginous incision was made 1 cm superior to the caudal margin of the nasal septum. Mucosal flap was also elevated on the right side in the similar fashion. It should be noted that due to the severe septal deviation, the deviated portion of the cartilaginous and bony septum had to be removed in piecemeal fashion. Once the deviated portions were removed, a straight midline septum was achieved. The septum was then quilted with 4-0 plain gut sutures. The hemitransfixion incision was closed with interrupted 4-0 chromic sutures.   The inferior one half of both hypertrophied inferior turbinate was crossclamped with a Kelly clamp. The inferior one half of each inferior turbinate was then resected with a pair of cross cutting scissors. Hemostasis was achieved with a suction cautery device.   Using a 0 endoscope, the left nasal cavity was examined. Polypoid tissue was noted within the middle meatus. The polypoid tissue was removed using a combination of microdebrider and Blakesley forceps. The uncinate process was resected with a freer elevator. The maxillary antrum was entered and enlarged using a combination of backbiter and microdebrider. Polypoid tissue and mucopurulent drainage were removed from the left maxillary sinus.  Attention was then focused on the ethmoid sinuses. The bony partitions of the anterior and posterior ethmoid cavities were taken down. Polypoid tissue was noted and  removed.  The sinuses were copiously irrigated with saline solution.  The  same procedure was repeated on the right side without exception. More polyps and purulent drainage were noted on the right side. All polyps were removed. Doyle splints were applied to the nasal septum.  The care of the patient was turned over to the anesthesiologist. The patient was awakened from anesthesia without difficulty. The patient was extubated and transferred to the recovery room in good condition.   OPERATIVE FINDINGS: Severe nasal septal deviation and bilateral inferior turbinate hypertrophy.  Bilateral chronic maxillary and ethmoid sinusitis/polyposis.  SPECIMEN: Bilateral sinus contents.   FOLLOWUP CARE: The patient be discharged home once he is awake and alert. The patient will follow up in my office in 3 days for splint removal.   Joline Encalada Philomena Doheny, MD

## 2022-08-04 NOTE — Anesthesia Procedure Notes (Signed)
Procedure Name: Intubation Date/Time: 08/04/2022 2:00 PM  Performed by: Lelon Perla, CRNAPre-anesthesia Checklist: Patient identified, Emergency Drugs available, Suction available and Patient being monitored Patient Re-evaluated:Patient Re-evaluated prior to induction Oxygen Delivery Method: Circle system utilized Preoxygenation: Pre-oxygenation with 100% oxygen Induction Type: IV induction Ventilation: Mask ventilation without difficulty Laryngoscope Size: Mac and 4 Grade View: Grade I Tube type: Oral Tube size: 7.5 mm Number of attempts: 1 Airway Equipment and Method: Stylet and Oral airway Placement Confirmation: ETT inserted through vocal cords under direct vision, positive ETCO2 and breath sounds checked- equal and bilateral Secured at: 23 cm Tube secured with: Tape Dental Injury: Teeth and Oropharynx as per pre-operative assessment

## 2022-08-04 NOTE — Discharge Instructions (Signed)

## 2022-08-04 NOTE — Transfer of Care (Signed)
Immediate Anesthesia Transfer of Care Note  Patient: Kenneth Cline  Procedure(s) Performed: NASAL SEPTOPLASTY WITH BILATERAL TURBINATE REDUCTION (Bilateral: Nose) BILATERAL ENDOSCOPIC ETHMOIDECTOMY (Bilateral: Nose) BILATERAL ENDOSCOPIC MAXILLARY ANTROSTOMY WITH TISSUE REMOVAL (Bilateral: Nose)  Patient Location: PACU  Anesthesia Type:General  Level of Consciousness: awake, alert , and oriented  Airway & Oxygen Therapy: Patient Spontanous Breathing  Post-op Assessment: Report given to RN and Post -op Vital signs reviewed and stable  Post vital signs: Reviewed and stable  Last Vitals:  Vitals Value Taken Time  BP 122/81 08/04/22 1536  Temp    Pulse 83 08/04/22 1538  Resp 13 08/04/22 1538  SpO2 94 % 08/04/22 1538  Vitals shown include unvalidated device data.  Last Pain:  Vitals:   08/04/22 1214  TempSrc:   PainSc: 0-No pain      Patients Stated Pain Goal: 0 (08/04/22 1214)  Complications: No notable events documented.

## 2022-08-04 NOTE — H&P (Signed)
Cc: Chronic rhinosinusitis, chronic nasal obstruction  HPI: The patient is a 67 year old male who returns today for his follow-up evaluation.  The patient was previously seen for frequent recurrent sinusitis and chronic nasal obstruction.  At his last visit 2 weeks ago, he was complaining of frequent nasal obstruction, facial pain and pressure, and nasal drainage.  He was treated with 3 courses of antibiotics and 2 courses of systemic steroids without improvement in his symptoms.  He also used Flonase and Astelin nasal sprays.  He subsequently underwent a sinus CT scan.  The CT showed chronic rhinosinusitis, involving his maxillary and ethmoid sinuses bilaterally.  He also has severe nasal septal deviation and bilateral inferior turbinate hypertrophy.  The patient returns today complaining of persistent symptoms.  He is interested in more definitive treatment.  Exam: General: Communicates without difficulty, well nourished, no acute distress. Head: Normocephalic, no evidence injury, no tenderness, facial buttresses intact without stepoff. Face/sinus: No tenderness to palpation and percussion. Facial movement is normal and symmetric. Eyes: PERRL, EOMI. No scleral icterus, conjunctivae clear. Neuro: CN II exam reveals vision grossly intact.  No nystagmus at any point of gaze. Ears: Auricles well formed without lesions.  Ear canals are intact without mass or lesion.  No erythema or edema is appreciated.  The TMs are intact without fluid. Nose: External evaluation reveals normal support and skin without lesions.  Dorsum is intact.  Anterior rhinoscopy reveals congested mucosa over anterior aspect of inferior turbinates and intact septum.  No purulence noted. Oral:  Oral cavity and oropharynx are intact, symmetric, without erythema or edema.  Mucosa is moist without lesions. Neck: Full range of motion without pain.  There is no significant lymphadenopathy.  No masses palpable.  Thyroid bed within normal limits to  palpation.  Parotid glands and submandibular glands equal bilaterally without mass.  Trachea is midline. Neuro:  CN 2-12 grossly intact. Gait normal. A flexible scope was inserted into the right nasal cavity.  Endoscopy of the interior nasal cavity, superior, inferior, and middle meatus was performed. The sphenoid-ethmoid recess was examined. Edematous mucosa was noted to obstruct the middle meatus.  Nasal septal deviation noted.  Olfactory cleft was clear.  Nasopharynx was clear.  Turbinates were hypertrophied but without mass. The procedure was repeated on the contralateral side with similar findings.  The patient tolerated the procedure well.  Assessment: 1.  Bilateral chronic maxillary and ethmoid sinusitis.  The patient has not responded to multiple antibiotic and steroid treatment. 2.  Nasal mucosal congestion, severe nasal septal deviation, and bilateral inferior turbinate hypertrophy.  More than 95% of his nasal passageways are obstructed bilaterally.  Plan: 1.  The nasal endoscopy findings and the CT images are reviewed with the patient. 2.  Continue with Flonase nasal spray and nasal saline irrigation daily. 3.  Based on his persistent symptoms, he will benefit from undergoing surgical intervention with bilateral endoscopic sinus surgery, septoplasty, and bilateral turbinate reduction.  The risk, benefits, and details of the procedures are reviewed. 4.  The patient would like to proceed with the procedures.

## 2022-08-05 NOTE — Anesthesia Postprocedure Evaluation (Signed)
Anesthesia Post Note  Patient: Kenneth Cline  Procedure(s) Performed: NASAL SEPTOPLASTY WITH BILATERAL TURBINATE REDUCTION (Bilateral: Nose) BILATERAL ENDOSCOPIC ETHMOIDECTOMY (Bilateral: Nose) BILATERAL ENDOSCOPIC MAXILLARY ANTROSTOMY WITH TISSUE REMOVAL (Bilateral: Nose)     Patient location during evaluation: PACU Anesthesia Type: General Level of consciousness: awake and alert Pain management: pain level controlled Vital Signs Assessment: post-procedure vital signs reviewed and stable Respiratory status: spontaneous breathing, nonlabored ventilation and respiratory function stable Cardiovascular status: blood pressure returned to baseline and stable Postop Assessment: no apparent nausea or vomiting Anesthetic complications: no   No notable events documented.  Last Vitals:  Vitals:   08/04/22 1600 08/04/22 1615  BP: (!) 133/91 (!) 145/91  Pulse: 82 83  Resp: 13 13  Temp:  36.8 C  SpO2: 93% 93%    Last Pain:  Vitals:   08/04/22 1600  TempSrc:   PainSc: 3                  Lowella Curb

## 2022-08-07 ENCOUNTER — Encounter (HOSPITAL_COMMUNITY): Payer: Self-pay | Admitting: Otolaryngology

## 2022-08-07 LAB — SURGICAL PATHOLOGY

## 2022-08-11 ENCOUNTER — Ambulatory Visit: Payer: Medicare Other | Attending: Cardiology

## 2022-08-11 DIAGNOSIS — D6859 Other primary thrombophilia: Secondary | ICD-10-CM

## 2022-08-11 DIAGNOSIS — I823 Embolism and thrombosis of renal vein: Secondary | ICD-10-CM | POA: Diagnosis present

## 2022-08-11 DIAGNOSIS — Z7901 Long term (current) use of anticoagulants: Secondary | ICD-10-CM | POA: Insufficient documentation

## 2022-08-11 DIAGNOSIS — I639 Cerebral infarction, unspecified: Secondary | ICD-10-CM | POA: Diagnosis present

## 2022-08-11 DIAGNOSIS — R760 Raised antibody titer: Secondary | ICD-10-CM | POA: Diagnosis present

## 2022-08-11 DIAGNOSIS — D68312 Antiphospholipid antibody with hemorrhagic disorder: Secondary | ICD-10-CM | POA: Diagnosis present

## 2022-08-11 LAB — POCT INR: INR: 1.5 — AB (ref 2.0–3.0)

## 2022-08-11 NOTE — Patient Instructions (Signed)
Description   Take 1.5 tablets today and tomorrow, then resume same dosage of Warfarin 1 tablet daily except 1.5 tablets on Mondays and Fridays. Continue Lovenox injections twice daily.  Recheck INR on Monday. [facility] [phone]

## 2022-08-14 ENCOUNTER — Ambulatory Visit: Payer: Medicare Other | Attending: Cardiology | Admitting: Pharmacist Clinician (PhC)/ Clinical Pharmacy Specialist

## 2022-08-14 ENCOUNTER — Telehealth: Payer: Self-pay

## 2022-08-14 DIAGNOSIS — D68312 Antiphospholipid antibody with hemorrhagic disorder: Secondary | ICD-10-CM

## 2022-08-14 DIAGNOSIS — R760 Raised antibody titer: Secondary | ICD-10-CM | POA: Insufficient documentation

## 2022-08-14 DIAGNOSIS — Z7901 Long term (current) use of anticoagulants: Secondary | ICD-10-CM | POA: Diagnosis present

## 2022-08-14 DIAGNOSIS — I823 Embolism and thrombosis of renal vein: Secondary | ICD-10-CM | POA: Insufficient documentation

## 2022-08-14 DIAGNOSIS — I639 Cerebral infarction, unspecified: Secondary | ICD-10-CM | POA: Insufficient documentation

## 2022-08-14 DIAGNOSIS — D6859 Other primary thrombophilia: Secondary | ICD-10-CM | POA: Insufficient documentation

## 2022-08-14 LAB — POCT INR: INR: 2.7 (ref 2.0–3.0)

## 2022-08-14 MED ORDER — LEVOTHYROXINE SODIUM 75 MCG PO TABS: 75.0000 ug | ORAL_TABLET | Freq: Every day | ORAL | 3 refills | Status: AC

## 2022-08-14 NOTE — Telephone Encounter (Signed)
Prescription Request  08/14/2022  LOV:02/03/22  What is the name of the medication or equipment? levothyroxine (SYNTHROID) 75 MCG tablet [[id number]]  Have you contacted your pharmacy to request a refill? Yes   Which pharmacy would you like this sent to?  [facility] [id number] [place], [place] - [address] [place] [address] Phone: [phone] Fax: [phone]    Patient notified that their request is being sent to the clinical staff for review and that they should receive a response within 2 business days.   Please advise at [facility] [phone]

## 2022-08-14 NOTE — Patient Instructions (Signed)
Continue Warfarin 1 tablet daily except 1.5 tablets on Mondays and Fridays.  Recheck INR in 2 weeks [facility] [phone]

## 2022-08-28 ENCOUNTER — Ambulatory Visit: Payer: Medicare Other | Attending: Cardiology

## 2022-08-28 DIAGNOSIS — I823 Embolism and thrombosis of renal vein: Secondary | ICD-10-CM

## 2022-08-28 DIAGNOSIS — Z7901 Long term (current) use of anticoagulants: Secondary | ICD-10-CM | POA: Diagnosis present

## 2022-08-28 LAB — POCT INR: INR: 3.5 — AB (ref 2.0–3.0)

## 2022-08-28 LAB — HM DIABETES EYE EXAM

## 2022-08-28 NOTE — Patient Instructions (Signed)
Description   Only take 0.5 tablet today and then continue Warfarin 1 tablet daily except 1.5 tablets on Mondays and Fridays.  Stay consistent with greens each week.  Recheck INR in 2 weeks [facility] [phone]

## 2022-09-05 ENCOUNTER — Encounter (HOSPITAL_COMMUNITY)
Admission: RE | Admit: 2022-09-05 | Discharge: 2022-09-05 | Disposition: A | Discharge status: Home / Self Care | Payer: Medicare Other | Source: Ambulatory Visit | Attending: Cardiology | Admitting: Cardiology

## 2022-09-05 DIAGNOSIS — E785 Hyperlipidemia, unspecified: Secondary | ICD-10-CM | POA: Diagnosis present

## 2022-09-05 MED ORDER — INCLISIRAN SODIUM 284 MG/1.5ML ~~LOC~~ SOSY
284.0000 mg | PREFILLED_SYRINGE | Freq: Once | SUBCUTANEOUS | Status: AC
  Administered 2022-09-05: 284 mg via SUBCUTANEOUS

## 2022-09-13 ENCOUNTER — Ambulatory Visit: Payer: Medicare Other

## 2022-09-20 ENCOUNTER — Ambulatory Visit: Payer: Medicare Other | Attending: Cardiovascular Disease

## 2022-09-20 DIAGNOSIS — Z7901 Long term (current) use of anticoagulants: Secondary | ICD-10-CM | POA: Diagnosis present

## 2022-09-20 DIAGNOSIS — I639 Cerebral infarction, unspecified: Secondary | ICD-10-CM | POA: Diagnosis present

## 2022-09-20 DIAGNOSIS — D6859 Other primary thrombophilia: Secondary | ICD-10-CM | POA: Insufficient documentation

## 2022-09-20 DIAGNOSIS — I823 Embolism and thrombosis of renal vein: Secondary | ICD-10-CM | POA: Diagnosis present

## 2022-09-20 DIAGNOSIS — R760 Raised antibody titer: Secondary | ICD-10-CM | POA: Diagnosis present

## 2022-09-20 DIAGNOSIS — D68312 Antiphospholipid antibody with hemorrhagic disorder: Secondary | ICD-10-CM | POA: Diagnosis present

## 2022-09-20 LAB — POCT INR: INR: 3.1 — AB (ref 2.0–3.0)

## 2022-09-20 NOTE — Patient Instructions (Signed)
Description   Skip today's dosage of Warfarin, and then continue Warfarin 1 tablet daily except 1.5 tablets on Mondays and Fridays.  Stay consistent with greens each week.  Recheck INR in 4 weeks [facility] [phone]

## 2022-09-29 ENCOUNTER — Encounter: Payer: Self-pay | Admitting: Family Medicine

## 2022-10-18 ENCOUNTER — Ambulatory Visit: Payer: Medicare Other | Attending: Cardiology

## 2022-10-18 DIAGNOSIS — I823 Embolism and thrombosis of renal vein: Secondary | ICD-10-CM | POA: Insufficient documentation

## 2022-10-18 DIAGNOSIS — Z7901 Long term (current) use of anticoagulants: Secondary | ICD-10-CM | POA: Diagnosis present

## 2022-10-18 LAB — POCT INR: INR: 2 (ref 2.0–3.0)

## 2022-10-18 NOTE — Patient Instructions (Addendum)
Description   Take 1 tablet daily since you are taking antibiotics.  Normal dose: Warfarin 1 tablet daily except 1.5 tablets on Mondays and Fridays.  Stay consistent with greens each week.  Recheck INR in 5 days.  [facility] [phone]

## 2022-10-23 ENCOUNTER — Ambulatory Visit (INDEPENDENT_AMBULATORY_CARE_PROVIDER_SITE_OTHER): Payer: Medicare Other

## 2022-10-23 DIAGNOSIS — Z5181 Encounter for therapeutic drug level monitoring: Secondary | ICD-10-CM | POA: Diagnosis not present

## 2022-10-23 LAB — POCT INR: INR: 2.2 (ref 2.0–3.0)

## 2022-10-23 NOTE — Patient Instructions (Addendum)
  Description   Called and spoke with pt. Instructed to resume taking Warfarin 1 tablet daily except 1.5 tablets on Mondays and Fridays.  Stay consistent with greens each week.  Recheck INR in 1 week.  [facility] [phone]

## 2022-10-31 ENCOUNTER — Ambulatory Visit: Payer: Medicare Other | Attending: Cardiology

## 2022-10-31 DIAGNOSIS — I823 Embolism and thrombosis of renal vein: Secondary | ICD-10-CM | POA: Diagnosis present

## 2022-10-31 DIAGNOSIS — Z7901 Long term (current) use of anticoagulants: Secondary | ICD-10-CM | POA: Diagnosis not present

## 2022-10-31 DIAGNOSIS — D6859 Other primary thrombophilia: Secondary | ICD-10-CM

## 2022-10-31 DIAGNOSIS — R760 Raised antibody titer: Secondary | ICD-10-CM | POA: Diagnosis present

## 2022-10-31 DIAGNOSIS — D68312 Antiphospholipid antibody with hemorrhagic disorder: Secondary | ICD-10-CM

## 2022-10-31 LAB — POCT INR: INR: 2 (ref 2.0–3.0)

## 2022-10-31 NOTE — Patient Instructions (Signed)
Continue taking Warfarin 1 tablet daily except 1.5 tablets on Mondays and Fridays.  Stay consistent with greens each week.  Recheck INR in 8 week.  Coumadin Clinic [phone]

## 2022-11-01 ENCOUNTER — Other Ambulatory Visit: Payer: Self-pay | Admitting: Cardiology

## 2022-11-01 ENCOUNTER — Other Ambulatory Visit: Payer: Self-pay | Admitting: Family Medicine

## 2022-11-02 NOTE — Telephone Encounter (Signed)
Requested Prescriptions  Pending Prescriptions Disp Refills   pantoprazole (PROTONIX) 40 MG tablet [Pharmacy Med Name: PANTOPRAZOLE SOD DR 40 MG TAB] 180 tablet 0    Sig: TAKE 1 TABLET BY MOUTH TWICE A DAY     Gastroenterology: Proton Pump Inhibitors Failed - 11/01/2022  7:09 PM      Failed - Valid encounter within last 12 months    Recent Outpatient Visits           1 year ago History of CVA (cerebrovascular accident)   [facility] Donita Brooks, MD   1 year ago Bacterial sinusitis   [facility] Valentino Nose, NP   1 year ago Septic olecranon bursitis of left elbow   [facility] Tanya Nones, Priscille Heidelberg, MD   1 year ago Left elbow pain   [facility] Valentino Nose, NP   1 year ago Irregular heart beats   [facility] Pickard, Priscille Heidelberg, MD

## 2022-11-03 ENCOUNTER — Telehealth: Payer: Self-pay | Admitting: Family Medicine

## 2022-11-03 ENCOUNTER — Other Ambulatory Visit: Payer: Self-pay

## 2022-11-03 DIAGNOSIS — M25522 Pain in left elbow: Secondary | ICD-10-CM

## 2022-11-03 DIAGNOSIS — Z8673 Personal history of transient ischemic attack (TIA), and cerebral infarction without residual deficits: Secondary | ICD-10-CM

## 2022-11-03 DIAGNOSIS — M71122 Other infective bursitis, left elbow: Secondary | ICD-10-CM

## 2022-11-03 MED ORDER — GABAPENTIN 300 MG PO CAPS: ORAL_CAPSULE | ORAL | 0 refills | Status: DC

## 2022-11-03 NOTE — Telephone Encounter (Signed)
Prescription Request  11/03/2022  LOV: 02/03/2022  What is the name of the medication or equipment?   gabapentin (NEURONTIN) 300 MG capsule   Have you contacted your pharmacy to request a refill? Yes   Which pharmacy would you like this sent to?  [facility] [id number] [place], [place] - [address] [place] [address] Phone: [phone] Fax: [phone]    Patient notified that their request is being sent to the clinical staff for review and that they should receive a response within 2 business days.   Please advise pharmacist

## 2022-12-01 ENCOUNTER — Other Ambulatory Visit: Payer: Self-pay | Admitting: Family Medicine

## 2022-12-01 DIAGNOSIS — M25522 Pain in left elbow: Secondary | ICD-10-CM

## 2022-12-01 DIAGNOSIS — Z8673 Personal history of transient ischemic attack (TIA), and cerebral infarction without residual deficits: Secondary | ICD-10-CM

## 2022-12-01 DIAGNOSIS — M71122 Other infective bursitis, left elbow: Secondary | ICD-10-CM

## 2022-12-04 NOTE — Telephone Encounter (Signed)
Requested medication (s) are due for refill today: Yes  Requested medication (s) are on the active medication list: Yes  Last refill:  11/03/22  Future visit scheduled: No  Notes to clinic:  See request.    Requested Prescriptions  Pending Prescriptions Disp Refills   gabapentin (NEURONTIN) 300 MG capsule [Pharmacy Med Name: GABAPENTIN 300 MG CAPSULE] 180 capsule 0    Sig: TAKE ONE CAPUSLE BY MOUTH THREE TIMES A DAY. MAY INCREASE TO UP TO 6 CAPSULE A DAY     Neurology: Anticonvulsants - gabapentin Failed - 12/01/2022 10:41 PM      Failed - Valid encounter within last 12 months    Recent Outpatient Visits           1 year ago History of CVA (cerebrovascular accident)   [facility] Donita Brooks, MD   1 year ago Bacterial sinusitis   [facility] Valentino Nose, NP   1 year ago Septic olecranon bursitis of left elbow   [facility] Tanya Nones, Priscille Heidelberg, MD   1 year ago Left elbow pain   [facility] Valentino Nose, NP   2 years ago Irregular heart beats   [facility] Pickard, Priscille Heidelberg, MD       Future Appointments             In 5 months Pickard, Priscille Heidelberg, MD [facility]            Passed - Cr in normal range and within 360 days    Creat  Date Value Ref Range Status  09/02/2021 1.21 0.70 - 1.35 mg/dL Final   Creatinine, Ser  Date Value Ref Range Status  07/31/2022 1.11 0.61 - 1.24 mg/dL Final         Passed - Completed PHQ-2 or PHQ-9 in the last 360 days

## 2022-12-05 ENCOUNTER — Other Ambulatory Visit: Payer: Self-pay | Admitting: Pharmacist

## 2022-12-05 ENCOUNTER — Telehealth: Payer: Self-pay | Admitting: Pharmacist

## 2022-12-05 DIAGNOSIS — E785 Hyperlipidemia, unspecified: Secondary | ICD-10-CM

## 2022-12-05 NOTE — Telephone Encounter (Signed)
Re order Leqvio as per request from Laurens Reis,RN

## 2022-12-06 ENCOUNTER — Ambulatory Visit (HOSPITAL_COMMUNITY)
Admission: RE | Admit: 2022-12-06 | Discharge: 2022-12-06 | Disposition: A | Discharge status: Home / Self Care | Payer: Medicare Other | Source: Ambulatory Visit | Attending: Cardiology | Admitting: Cardiology

## 2022-12-06 DIAGNOSIS — E785 Hyperlipidemia, unspecified: Secondary | ICD-10-CM | POA: Insufficient documentation

## 2022-12-06 MED ORDER — INCLISIRAN SODIUM 284 MG/1.5ML ~~LOC~~ SOSY
284.0000 mg | PREFILLED_SYRINGE | Freq: Once | SUBCUTANEOUS | Status: AC
  Administered 2022-12-06: 284 mg via SUBCUTANEOUS

## 2022-12-06 MED ORDER — INCLISIRAN SODIUM 284 MG/1.5ML ~~LOC~~ SOSY
PREFILLED_SYRINGE | SUBCUTANEOUS | Status: AC
  Filled 2022-12-06: qty 1.5

## 2022-12-26 ENCOUNTER — Ambulatory Visit: Payer: Medicare Other

## 2023-01-03 ENCOUNTER — Ambulatory Visit: Payer: Medicare Other | Attending: Internal Medicine | Admitting: *Deleted

## 2023-01-03 DIAGNOSIS — R760 Raised antibody titer: Secondary | ICD-10-CM | POA: Insufficient documentation

## 2023-01-03 DIAGNOSIS — I823 Embolism and thrombosis of renal vein: Secondary | ICD-10-CM | POA: Diagnosis present

## 2023-01-03 DIAGNOSIS — D68312 Antiphospholipid antibody with hemorrhagic disorder: Secondary | ICD-10-CM | POA: Insufficient documentation

## 2023-01-03 DIAGNOSIS — I639 Cerebral infarction, unspecified: Secondary | ICD-10-CM | POA: Insufficient documentation

## 2023-01-03 DIAGNOSIS — D6859 Other primary thrombophilia: Secondary | ICD-10-CM | POA: Diagnosis present

## 2023-01-03 DIAGNOSIS — Z7901 Long term (current) use of anticoagulants: Secondary | ICD-10-CM | POA: Diagnosis not present

## 2023-01-03 LAB — POCT INR: INR: 2.3 (ref 2.0–3.0)

## 2023-01-03 NOTE — Patient Instructions (Signed)
Description   Continue taking Warfarin 1 tablet daily except 1.5 tablets on Mondays and Fridays.  Stay consistent with greens each week.  Recheck INR in 8 weeks.  Coumadin [facility] [phone]

## 2023-01-21 ENCOUNTER — Other Ambulatory Visit: Payer: Self-pay | Admitting: Family Medicine

## 2023-01-22 NOTE — Telephone Encounter (Signed)
OV needed for additional refills.  Requested Prescriptions  Pending Prescriptions Disp Refills   ursodiol (ACTIGALL) 500 MG tablet [Pharmacy Med Name: URSODIOL 500 MG TABLET] 60 tablet 0    Sig: TAKE 1 BY MOUTH 2 TIMES A DAY     Gastroenterology:  Antigallstone Agents Failed - 01/21/2023  6:50 AM      Failed - Valid encounter within last 12 months    Recent Outpatient Visits           1 year ago History of CVA (cerebrovascular accident)   [facility] Donita Brooks, MD   1 year ago Bacterial sinusitis   [facility] Valentino Nose, NP   1 year ago Septic olecranon bursitis of left elbow   [facility] Donita Brooks, MD   2 years ago Left elbow pain   [facility] Valentino Nose, NP   2 years ago Irregular heart beats   [facility] Tanya Nones, Priscille Heidelberg, MD       Future Appointments             In 3 months Pickard, Priscille Heidelberg, MD [facility]            Passed - ALT in normal range and within 180 days    ALT  Date Value Ref Range Status  07/31/2022 31 0 - 44 U/L Final         Passed - AST in normal range and within 180 days    AST  Date Value Ref Range Status  07/31/2022 29 15 - 41 U/L Final

## 2023-01-28 ENCOUNTER — Other Ambulatory Visit: Payer: Self-pay | Admitting: Family Medicine

## 2023-01-29 NOTE — Telephone Encounter (Signed)
Appointment 3 months Requested Prescriptions  Pending Prescriptions Disp Refills   pantoprazole (PROTONIX) 40 MG tablet [Pharmacy Med Name: PANTOPRAZOLE SOD DR 40 MG TAB] 180 tablet 0    Sig: TAKE 1 TABLET BY MOUTH 2 TIMES A DAY     Gastroenterology: Proton Pump Inhibitors Failed - 01/28/2023  6:51 AM      Failed - Valid encounter within last 12 months    Recent Outpatient Visits           1 year ago History of CVA (cerebrovascular accident)   [facility] Donita Brooks, MD   1 year ago Bacterial sinusitis   [facility] Valentino Nose, NP   2 years ago Septic olecranon bursitis of left elbow   [facility] Donita Brooks, MD   2 years ago Left elbow pain   [facility] Valentino Nose, NP   2 years ago Irregular heart beats   [facility] Pickard, Priscille Heidelberg, MD       Future Appointments             In 3 months Pickard, Priscille Heidelberg, MD [facility]

## 2023-02-14 ENCOUNTER — Encounter (INDEPENDENT_AMBULATORY_CARE_PROVIDER_SITE_OTHER): Payer: Self-pay

## 2023-02-14 ENCOUNTER — Ambulatory Visit (INDEPENDENT_AMBULATORY_CARE_PROVIDER_SITE_OTHER): Payer: Medicare Other | Admitting: Otolaryngology

## 2023-02-14 VITALS — Ht 68.0 in | Wt 180.0 lb

## 2023-02-14 DIAGNOSIS — J3489 Other specified disorders of nose and nasal sinuses: Secondary | ICD-10-CM | POA: Diagnosis not present

## 2023-02-14 DIAGNOSIS — J329 Chronic sinusitis, unspecified: Secondary | ICD-10-CM

## 2023-02-14 DIAGNOSIS — J322 Chronic ethmoidal sinusitis: Secondary | ICD-10-CM

## 2023-02-14 DIAGNOSIS — J320 Chronic maxillary sinusitis: Secondary | ICD-10-CM

## 2023-02-17 DIAGNOSIS — J320 Chronic maxillary sinusitis: Secondary | ICD-10-CM | POA: Insufficient documentation

## 2023-02-17 DIAGNOSIS — J322 Chronic ethmoidal sinusitis: Secondary | ICD-10-CM | POA: Insufficient documentation

## 2023-02-17 NOTE — Progress Notes (Signed)
Patient ID: Kenneth Cline, male   DOB: 02-28-1956, 67 y.o.   MRN: [id number]  Procedure: Nasal/sinus debridement status post endoscopic sinus surgery.  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  After the surgery, he continues to have recurrent sinusitis and recurrent crusting formation in his nasal cavities.  The patient returns today complaining of significant nasal congestion and crusting.  He had no bleeding from the nasal cavities.  Currently the patient denies any facial pain, fever, or visual change.  Anesthesia: Topical Xylocaine and Neo-Synephrine.  Description: The patient is placed upright in the exam chair. Both nasal cavities are sprayed with topical Xylocaine and Neo-Synephrine.  A 0 rigid endoscope is used for the examination. The scope is first advanced past the right nostril into the right nasal cavity. A significant amount of crusting is noted within the right nasal cavity and the maxillary/ethmoid cavities.  The crusting is removed with suction catheters and alligator forceps, which are inserted in parallel with the rigid endoscope.  After the debridement procedure, the maxillary antrum and the ethmoid cavities are noted to be widely patent.    The same procedure is then repeated on the left side without exception. Similar findings are again noted on the left. The patient tolerated the procedure well.  Follow up care: The patient is instructed to perform regular nasal saline irrigation.  The patient will return for re-evaluation in approximately 3 months.

## 2023-02-18 ENCOUNTER — Other Ambulatory Visit: Payer: Self-pay | Admitting: Family Medicine

## 2023-02-20 NOTE — Telephone Encounter (Signed)
Requested medication (s) are due for refill today: Yes  Requested medication (s) are on the active medication list: Yes  Last refill:  01/22/23  Future visit scheduled: No  Notes to clinic:  Protocol indicated pt. Needs appointment.    Requested Prescriptions  Pending Prescriptions Disp Refills   ursodiol (ACTIGALL) 500 MG tablet [Pharmacy Med Name: URSODIOL 500 MG TABLET] 60 tablet 0    Sig: TAKE 1 TABLET BY MOUTH 2 TIMES A DAY     Gastroenterology:  Antigallstone Agents Failed - 02/18/2023 12:05 PM      Failed - ALT in normal range and within 180 days    ALT  Date Value Ref Range Status  07/31/2022 31 0 - 44 U/L Final         Failed - AST in normal range and within 180 days    AST  Date Value Ref Range Status  07/31/2022 29 15 - 41 U/L Final         Failed - Valid encounter within last 12 months    Recent Outpatient Visits           1 year ago History of CVA (cerebrovascular accident)   [facility] Donita Brooks, MD   1 year ago Bacterial sinusitis   [facility] Valentino Nose, NP   2 years ago Septic olecranon bursitis of left elbow   [facility] Donita Brooks, MD   2 years ago Left elbow pain   [facility] Valentino Nose, NP   2 years ago Irregular heart beats   [facility] Pickard, Priscille Heidelberg, MD       Future Appointments             In 2 months Pickard, Priscille Heidelberg, MD [facility]

## 2023-02-22 ENCOUNTER — Ambulatory Visit: Payer: Medicare Other

## 2023-02-22 VITALS — Ht 68.0 in | Wt 180.0 lb

## 2023-02-22 DIAGNOSIS — Z0000 Encounter for general adult medical examination without abnormal findings: Secondary | ICD-10-CM | POA: Diagnosis not present

## 2023-02-22 NOTE — Patient Instructions (Signed)
Kenneth Cline , Thank you for taking time to come for your Medicare Wellness Visit. I appreciate your ongoing commitment to your health goals. Please review the following plan we discussed and let me know if I can assist you in the future.   Referrals/Orders/Follow-Ups/Clinician Recommendations: Aim for 30 minutes of exercise or brisk walking, 6-8 glasses of water, and 5 servings of fruits and vegetables each day.  This is a list of the screening recommended for you and due dates:  Health Maintenance  Topic Date Due   Yearly kidney health urinalysis for diabetes  Never done   COVID-19 Vaccine (4 - 2023-24 season) 12/24/2022   Yearly kidney function blood test for diabetes  07/31/2023   Medicare Annual Wellness Visit  02/22/2024   Colon Cancer Screening  03/03/2025   DTaP/Tdap/Td vaccine (3 - Tdap) 01/28/2030   Pneumonia Vaccine  Completed   Flu Shot  Completed   Hepatitis C Screening  Completed   Zoster (Shingles) Vaccine  Completed   HPV Vaccine  Aged Out    Advanced directives: (ACP Link)Information on Advanced Care Planning can be found at [facility] [place] Advance Health Care Directives Advance Health Care Directives ([email or web address]   Next Medicare Annual Wellness Visit scheduled for next year: Yes

## 2023-02-22 NOTE — Progress Notes (Signed)
Subjective:   Kenneth Cline is a 67 y.o. male who presents for Medicare Annual/Subsequent preventive examination.  Visit Complete: Virtual I connected with  Daun Peacock on 02/22/23 by a audio enabled telemedicine application and verified that I am speaking with the correct person using two identifiers.  Patient Location: Home  Provider Location: Home Office  I discussed the limitations of evaluation and management by telemedicine. The patient expressed understanding and agreed to proceed.  Vital Signs: Because this visit was a virtual/telehealth visit, some criteria may be missing or patient reported. Any vitals not documented were not able to be obtained and vitals that have been documented are patient reported.  Patient Medicare AWV questionnaire was completed by the patient on 02/21/23; I have confirmed that all information answered by patient is correct and no changes since this date.  Cardiac Risk Factors include: advanced age (>75men, >63 women);male gender;hypertension;dyslipidemia     Objective:    Today's Vitals   02/22/23 0954  Weight: 180 lb (81.6 kg)  Height: 5\' 8"  (1.727 m)   Body mass index is 27.37 kg/m.     02/22/2023   11:08 AM 08/04/2022   12:14 PM 07/31/2022   11:11 AM 01/18/2022    8:38 AM 01/13/2021    9:11 AM 08/10/2014    8:11 PM  Advanced Directives  Does Patient Have a Medical Advance Directive? No Yes Yes Yes Yes No  Type of Special educational needs teacher of [place];Living will Healthcare Power of [place];Living will Healthcare Power of [place];Living will Healthcare Power of [place];Living will   Does patient want to make changes to medical advance directive?  No - Patient declined      Copy of Healthcare Power of Attorney in Chart?  No - copy requested No - copy requested No - copy requested No - copy requested   Would patient like information on creating a medical advance directive? Yes (MAU/Ambulatory/Procedural Areas - Information  given)         Current Medications (verified) Outpatient Encounter Medications as of 02/22/2023  Medication Sig   Calcium Carb-Cholecalciferol (CALCIUM + VITAMIN D3 PO) Take 1 tablet by mouth daily.   clonazePAM (KLONOPIN) 0.5 MG tablet Take 0.5 mg by mouth 2 (two) times daily as needed for anxiety.   enoxaparin (LOVENOX) 80 MG/0.8ML injection Inject 0.8 mLs (80 mg total) into the skin every 12 (twelve) hours.   ezetimibe (ZETIA) 10 MG tablet TAKE ONE TABLET BY MOUTH DAILY   ferrous sulfate 325 (65 FE) MG tablet Take 325 mg by mouth daily with breakfast.   fludrocortisone (FLORINEF) 0.1 MG tablet Take 0.1 mg by mouth daily.   gabapentin (NEURONTIN) 300 MG capsule TAKE 1 CAPSULE BY MOUTH 3 TIMES A DAY MAY INCREASE TO UP TO 6 CAPSULES A DAY   guaiFENesin (MUCINEX) 600 MG 12 hr tablet Take 600-1,200 mg by mouth 2 (two) times daily as needed for to loosen phlegm or cough.   inclisiran (LEQVIO) 284 MG/1.5ML SOSY injection Inject 1.5 mLs (284 mg total) into the skin.   levothyroxine (SYNTHROID) 75 MCG tablet Take 1 tablet (75 mcg total) by mouth daily before breakfast. Alternate taking 75 mcg  ( whole tablet)  with 37.5 mcg ( 1/2 tablet) by mouth every other day   magnesium oxide (MAG-OX) 400 MG tablet Take 400 mg by mouth daily.   metFORMIN (GLUCOPHAGE-XR) 500 MG 24 hr tablet Take 500 mg by mouth 2 (two) times daily with a meal.   metoprolol succinate (TOPROL-XL)  50 MG 24 hr tablet TAKE 1 TABLET BY MOUTH DAILY WITH OR IMMEDIATELY FOLLOWING A MEAL   mometasone (NASONEX) 50 MCG/ACT nasal spray Place 2 sprays into the nose daily as needed (allergies).   pantoprazole (PROTONIX) 40 MG tablet TAKE 1 TABLET BY MOUTH 2 TIMES A DAY   predniSONE (DELTASONE) 1 MG tablet Take 8mg  PO Q AM and 2.5mg  PO Q PM (Patient taking differently: Take 2 mg by mouth See admin instructions. Take 7 mg PO Q AM and 2.5mg  PO Q PM, taking with 5 mg)   predniSONE (DELTASONE) 5 MG tablet Take 1 tablet (5 mg total) by mouth in the  morning and at bedtime. (Patient taking differently: Take 2.5-5 mg by mouth See admin instructions. 5 mg with 2 mg every morning (7 mg), and 2.5 mg in the evening)   Probiotic Product (PROBIOTIC DAILY PO) Take 1 capsule by mouth daily.   sertraline (ZOLOFT) 50 MG tablet Take 1 tablet (50 mg total) by mouth daily.   ursodiol (ACTIGALL) 500 MG tablet TAKE 1 BY MOUTH 2 TIMES A DAY   No facility-administered encounter medications on file as of 02/22/2023.    Allergies (verified) Biaxin [clarithromycin], Keflex [cephalexin], Macrolides and ketolides, Erythromycin, and Penicillins   History: Past Medical History:  Diagnosis Date   Adrenal hemorrhage (HCC) 07/2014   Following shoulder surgery   Adrenal insufficiency, primary, hemorrhagic (HCC) 07/2014   due to bilateral adrenal hemorrhage   Antiphospholipid antibody positive 2016   cardiolipin ab positive, B2-glyco positive, lupus anticoagulant positive   Antiphospholipid syndrome (HCC)    Autoimmune hepatitis (HCC)    PSC   Cellulitis 09/2018   septic shock, compartment syndrome s/p fasciotomy RLE at [facility]   CVA (cerebral infarction) 04/2015   3 small areas of infarct in both the cerebrum as well as the cerebellum -- , located with diplopia   Cyclic neutropenia (HCC)    Status post bone marrow transplant   Dyslipidemia    Erectile dysfunction    Essential hypertension    Family history of premature coronary artery disease    2 brothers, one sister and father all with CAD.  One sister with valve disease.   GERD (gastroesophageal reflux disease)    H/O acute cholangitis 08/2014   Complicated by septic shock, acute pancreatitis and acute choledocholithiasis;    Lupus anticoagulant with hemorrhagic disorder (HCC)    Previously on Xarelto. Now on warfarin   Overweight (BMI 25.0-29.9)    PSC (primary sclerosing cholangitis)    managed by Dr. 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 Likely cholangitis   ERCP W/ PLASTIC STENT PLACEMENT  Sep 09 2014  [facility] - Dr. Benedetto Goad   ERCP W/ SPHINCTEROTOMY AND BALLOON DILATION  11/10/2014   [facility] -- Removal of biliary stent,, to PACU duodenitis   ETHMOIDECTOMY Bilateral 08/04/2022   Procedure: BILATERAL ENDOSCOPIC ETHMOIDECTOMY;  Surgeon: Newman Pies, MD;  Location: [facility] OR;  Service: ENT;  Laterality: Bilateral;   LAPAROSCOPIC CHOLECYSTECTOMY W/ CHOLANGIOGRAPHY  November 13 2014   [facility], Dr. Sylvie Farrier [place]   MAXILLARY ANTROSTOMY Bilateral 08/04/2022   Procedure: BILATERAL ENDOSCOPIC MAXILLARY ANTROSTOMY WITH TISSUE REMOVAL;  Surgeon: Newman Pies, MD;  Location: [facility] OR;  Service: ENT;  Laterality: Bilateral;   NASAL SEPTOPLASTY W/ TURBINOPLASTY Bilateral 08/04/2022   Procedure: NASAL SEPTOPLASTY WITH BILATERAL TURBINATE REDUCTION;  Surgeon: Newman Pies, MD;  Location: [facility] OR;  Service: ENT;  Laterality: Bilateral;   NM MYOCAR PERF WALL MOTION  06/28/2006   EF 70% , LV systolic fx norm.   ROTATOR CUFF REPAIR     TRANSTHORACIC ECHOCARDIOGRAM  04/2015   No pericardial effusion. No shunt. Full study: Basal septal LVH. Pseudo-normal filling (GR 2 DD). EF 57%.   TRANSTHORACIC ECHOCARDIOGRAM  08/2015    [facility]): Normal LV size. Basal septal hypertrophy. EF 57%. Pseudo-normal filling. Mild focal aortic valve thickening. (Study was done to evaluate for possible endocarditis)   Family History  Problem Relation Age of Onset   Heart attack Father 32       Diet cardiac arrest   Sudden death Father 48   Heart disease Sister 48       triscuspid valve insuff,cardioversion   Hypertension Sister        She is currently in her 58s as well   Hypertension Brother    Heart disease Brother 64       CABG; now age 2   Ovarian cancer Maternal Grandmother    Heart attack Brother 21       Died from cardiac arrest   Hypertension Brother    Hypertension Sister    Heart disease Sister 54       CABG plus valve in 43's;    Sudden death Brother    Heart failure Sister        Died from complications of CHF following bowel surgery   Social History   Socioeconomic History   Marital status: Married    Spouse name: Not on file   Number of children: 2   Years of education: Not on file   Highest education level: Not on file  Occupational History    Employer: [employer]  Tobacco Use   Smoking status: Former    Current packs/day: 0.50    Types: Cigarettes   Smokeless tobacco: Never  Vaping Use   Vaping status: Never Used  Substance and Sexual Activity   Alcohol use: Not Currently    Alcohol/week: 3.0 standard drinks of alcohol    Types: 3 Standard drinks or equivalent per week    Comment: occassional   Drug use: No   Sexual activity: Not Currently    Birth control/protection: Surgical, None  Other Topics Concern   Not on file  Social History Narrative   He is a married father of 2 with 3 stepchildren.  He has 3 grandchildren his own and 3 step grandchildren.  He works as a Curator for [employer].  He lives with his wife, [name], of 8 years.  He does not, and never did smoke.  He takes occasional alcohol beverage but nothing significant.  He does not do routine exercise, but does walk  a lot at work.    Social Determinants of Health   Financial Resource Strain: Low Risk  (02/21/2023)   Overall Financial Resource Strain (CARDIA)    Difficulty of Paying Living Expenses: Not very hard  Food Insecurity: No Food Insecurity (02/21/2023)   Hunger Vital Sign    Worried About Running Out of Food in the Last Year: Never true    Ran Out of Food in the Last Year: Never true  Transportation Needs: No Transportation Needs (02/21/2023)   PRAPARE - Administrator, Civil Service (Medical): No    Lack of Transportation (Non-Medical): No  Physical Activity: Inactive (02/21/2023)   Exercise Vital Sign    Days of Exercise per Week: 0 days    Minutes of Exercise per Session: 0 min  Stress: No Stress Concern Present (02/21/2023)   [employer] of Occupational Health - Occupational Stress Questionnaire    Feeling of Stress : Only a little  Social Connections: Moderately Integrated (02/21/2023)   Social Connection and Isolation Panel [NHANES]    Frequency of Communication with Friends and Family: Three times a week    Frequency of Social Gatherings with Friends and Family: Once a week    Attends Religious Services: More than 4 times per year    Active Member of [employer] or Organizations: No    Attends Engineer, structural: Never    Marital Status: Married    Tobacco Counseling Counseling given: Not Answered   Clinical Intake:  Pre-visit preparation completed: Yes  Pain : No/denies pain     Diabetes: No  How often do you need to have someone help you when you read instructions, pamphlets, or other written materials from your doctor or pharmacy?: 1 - Never  Interpreter Needed?: No  Information entered by :: Kandis Fantasia LPN   Activities of Daily Living    02/21/2023    1:51 PM 07/31/2022   11:17 AM  In your present state of health, do you have any difficulty performing the following activities:  Hearing? 0   Vision? 0   Difficulty concentrating or making  decisions? 0   Walking or climbing stairs? 0   Dressing or bathing? 0   Doing errands, shopping? 0 0  Preparing Food and eating ? N   Using the Toilet? N   In the past six months, have you accidently leaked urine? N   Do you have problems with loss of bowel control? N   Managing your Medications? N   Managing your Finances? N   Housekeeping or managing your Housekeeping? N     Patient Care Team: Donita Brooks, MD as PCP - General (Family Medicine) Marykay Lex, MD as PCP - Cardiology (Cardiology) Duke, Roe Rutherford, PA as Physician Assistant (Cardiology)  Indicate any recent Medical Services you may have received from other than [facility] providers in the past year (date may be approximate).     Assessment:   This is a routine wellness examination for Efton.  Hearing/Vision screen Hearing Screening - Comments:: Denies hearing difficulties   Vision Screening - Comments:: Wears rx glasses - up to date with routine eye exams with Dr. Dione Booze     Goals Addressed   None   Depression Screen    02/22/2023    9:59 AM 01/18/2022    8:36 AM 01/02/2022    3:22 PM 09/02/2021   10:07 AM 01/13/2021    9:09 AM 01/05/2021   10:51 AM 04/30/2017    9:22 AM  PHQ 2/9 Scores  PHQ - 2 Score 0 0 0 0 0 0 1  PHQ- 9 Score       6    Fall Risk    02/22/2023   11:09 AM 02/21/2023    1:51 PM 01/18/2022    8:38 AM 01/14/2022    8:57 AM 09/02/2021   10:07 AM  Fall Risk   Falls in the past year? 0 0 0 0 0  Number falls in past yr: 0  0  0  Injury with Fall? 0  0  0  Risk for fall due to : No Fall Risks  No Fall Risks  No Fall Risks  Follow up Falls prevention discussed;Education provided;Falls evaluation completed  Falls prevention discussed  Falls evaluation completed    MEDICARE RISK AT HOME: Medicare Risk at Home Any stairs in or around the home?: Yes If so, are there any without handrails?: Yes Home free of loose throw rugs in walkways, pet beds, electrical cords, etc?: No Adequate  lighting in your home to reduce risk of falls?: Yes Life alert?: No Use of a cane, walker or w/c?: No Grab bars in the bathroom?: No Shower chair or bench in shower?: Yes Elevated toilet seat or a handicapped toilet?: Yes  TIMED UP AND GO:  Was the test performed?  No    Cognitive Function:        01/18/2022    8:40 AM 01/13/2021    9:16 AM  6CIT Screen  What Year? 0 points 0 points  What month? 0 points 0 points  What time? 0 points 0 points  Count back from 20 0 points 0 points  Months in reverse 2 points 0 points  Repeat phrase 0 points 2 points  Total Score 2 points 2 points    Immunizations Immunization History  Administered Date(s) Administered   DTP / HiB 01/01/2013   Influenza Inj Mdck Quad Pf 12/31/2017, 12/31/2017   Influenza Split 01/10/2017   Influenza Whole 01/01/2013   Influenza, Quadrivalent, Recombinant, Inj, Pf 12/28/2014   Influenza,inj,Quad PF,6+ Mos 01/10/2017, 12/25/2018   Influenza,inj,quad, With Preservative 12/28/2014   Influenza-Unspecified 01/01/2013, 12/28/2014, 01/10/2017, 12/31/2017, 01/01/2023   PFIZER(Purple Top)SARS-COV-2 Vaccination 06/08/2019, 07/02/2019, 02/23/2020   PNEUMOCOCCAL CONJUGATE-20 09/02/2021   PPD Test 09/02/2015   Pneumococcal Conjugate-13 03/17/2017, 04/05/2017   Td 01/29/2020   Zoster Recombinant(Shingrix) 03/17/2017, 07/16/2017    TDAP status: Up to date  Flu Vaccine status: Up to date  Pneumococcal vaccine status: Up to date  Covid-19 vaccine status: Information provided on how to obtain vaccines.   Qualifies for Shingles Vaccine? Yes   Zostavax completed No   Shingrix Completed?: Yes  Screening Tests Health Maintenance  Topic Date Due   Diabetic kidney evaluation - Urine ACR  Never done   COVID-19 Vaccine (4 - 2023-24 season) 12/24/2022   Diabetic kidney evaluation - eGFR measurement  07/31/2023   Medicare Annual Wellness (AWV)  02/22/2024   Colonoscopy  03/03/2025   DTaP/Tdap/Td (3 - Tdap)  01/28/2030   Pneumonia Vaccine 58+ Years old  Completed   INFLUENZA VACCINE  Completed   Hepatitis C Screening  Completed   Zoster Vaccines- Shingrix  Completed   HPV VACCINES  Aged Out    Health Maintenance  Health Maintenance Due  Topic Date Due   Diabetic kidney evaluation - Urine ACR  Never done   COVID-19 Vaccine (4 - 2023-24 season) 12/24/2022    Colorectal cancer screening: Type of screening: Colonoscopy. Completed 03/03/20. Repeat every 5  years  Lung Cancer Screening: (Low Dose CT Chest recommended if Age 45-80 years, 20 pack-year currently smoking OR have quit w/in 15years.) does not qualify.   Lung Cancer Screening Referral: n/a   Additional Screening:  Hepatitis C Screening: does qualify; Completed 08/16/14  Vision Screening: Recommended annual ophthalmology exams for early detection of glaucoma and other disorders of the eye. Is the patient up to date with their annual eye exam?  Yes  Who is the provider or what is the name of the office in which the patient attends annual eye exams? [facility]  If pt is not established with a provider, would they like to be referred to a provider to establish care? No .   Dental Screening: Recommended annual dental exams for proper oral hygiene  Community Resource Referral / Chronic Care Management: CRR required this visit?  No   CCM required this visit?  No     Plan:     I have personally reviewed and noted the following in the patient's chart:   Medical and social history Use of alcohol, tobacco or illicit drugs  Current medications and supplements including opioid prescriptions. Patient is not currently taking opioid prescriptions. Functional ability and status Nutritional status Physical activity Advanced directives List of other physicians Hospitalizations, surgeries, and ER visits in previous 12 months Vitals Screenings to include cognitive, depression, and falls Referrals and appointments  In addition,  I have reviewed and discussed with patient certain preventive protocols, quality metrics, and best practice recommendations. A written personalized care plan for preventive services as well as general preventive health recommendations were provided to patient.     Kandis Fantasia [place], [place]   16/01/9603   After Visit Summary: (MyChart) Due to this being a telephonic visit, the after visit summary with patients personalized plan was offered to patient via MyChart   Nurse Notes: No concerns at this time

## 2023-02-26 NOTE — Telephone Encounter (Signed)
Requested medication (s) are due for refill today:yes  Requested medication (s) are on the active medication list: yes  Last refill:  01/22/23 #60  Future visit scheduled: yes  Notes to clinic:  overdue lab work   Requested Prescriptions  Pending Prescriptions Disp Refills   ursodiol (ACTIGALL) 500 MG tablet [Pharmacy Med Name: URSODIOL 500 MG TABLET] 60 tablet 0    Sig: TAKE 1 TABLET BY MOUTH 2 TIMES A DAY     Gastroenterology:  Antigallstone Agents Failed - 02/26/2023  4:21 PM      Failed - ALT in normal range and within 180 days    ALT  Date Value Ref Range Status  07/31/2022 31 0 - 44 U/L Final         Failed - AST in normal range and within 180 days    AST  Date Value Ref Range Status  07/31/2022 29 15 - 41 U/L Final         Failed - Valid encounter within last 12 months    Recent Outpatient Visits           1 year ago History of CVA (cerebrovascular accident)   [facility] Donita Brooks, MD   1 year ago Bacterial sinusitis   [facility] Valentino Nose, NP   2 years ago Septic olecranon bursitis of left elbow   [facility] Donita Brooks, MD   2 years ago Left elbow pain   [facility] Valentino Nose, NP   2 years ago Irregular heart beats   [facility] Pickard, Priscille Heidelberg, MD       Future Appointments             In 3 days Pickard, Priscille Heidelberg, MD [facility]   In 2 months Pickard, Priscille Heidelberg, MD [facility]

## 2023-02-26 NOTE — Telephone Encounter (Signed)
Pharmacy sent refill request for  ursodiol (ACTIGALL) 500 MG tablet   LOV 02/03/2022  Med refill appointment scheduled for: Thursday, 03/01/23  Pharmacy:  [facility] [id number] - [place], [place] - [address], [place] [address] Phone: [phone]  Fax: [phone]   Please advise pharmacist.

## 2023-03-01 ENCOUNTER — Encounter: Payer: Self-pay | Admitting: Family Medicine

## 2023-03-01 ENCOUNTER — Ambulatory Visit: Payer: Medicare Other | Attending: Cardiology

## 2023-03-01 ENCOUNTER — Ambulatory Visit (INDEPENDENT_AMBULATORY_CARE_PROVIDER_SITE_OTHER): Payer: Medicare Other | Admitting: Family Medicine

## 2023-03-01 VITALS — BP 120/70 | HR 75 | Temp 97.7°F | Ht 68.0 in | Wt 183.6 lb

## 2023-03-01 DIAGNOSIS — Z8673 Personal history of transient ischemic attack (TIA), and cerebral infarction without residual deficits: Secondary | ICD-10-CM

## 2023-03-01 DIAGNOSIS — E039 Hypothyroidism, unspecified: Secondary | ICD-10-CM | POA: Diagnosis not present

## 2023-03-01 DIAGNOSIS — E271 Primary adrenocortical insufficiency: Secondary | ICD-10-CM

## 2023-03-01 DIAGNOSIS — E118 Type 2 diabetes mellitus with unspecified complications: Secondary | ICD-10-CM | POA: Diagnosis not present

## 2023-03-01 DIAGNOSIS — I823 Embolism and thrombosis of renal vein: Secondary | ICD-10-CM | POA: Diagnosis present

## 2023-03-01 DIAGNOSIS — Z7901 Long term (current) use of anticoagulants: Secondary | ICD-10-CM | POA: Insufficient documentation

## 2023-03-01 LAB — POCT INR: INR: 2.7 (ref 2.0–3.0)

## 2023-03-01 NOTE — Progress Notes (Signed)
Subjective:    Patient ID: Kenneth Cline, male    DOB: 30-Sep-1955, 67 y.o.   MRN: [id number]  HPI 01/02/22 He has a very complicated past medical history. He has a history of lupus anticoagulant. 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  He is on chronic fludrocortisone as well as prednisone for this. He has a history of type 2 diabetes as well as hypothyroidism which is treated by endocrinology. He also has a history of acute cholangitis requiring ERCP and bile duct stenting.  This is due to primary sclerosing cholangitis.  Patient is on chronic prednisone for this reason.    03/01/23  Here for check up.  Flu shot, pneumonia vaccine, shingles vaccine, and rsv are UTD.  Declines covid shot.  Gets INR checked at cardiology.  States TSH was recently checked and was fine.  Over due for lipids and diabetes evaluation.  Denies CP, SOB DOE.  Has abrasion on volar left forearm distal to antecubital fossa that is healing well.  Otherwise ROS is negative.  Past Medical History:  Diagnosis Date   Adrenal hemorrhage (HCC) 07/2014   Following shoulder surgery   Adrenal insufficiency, primary, hemorrhagic (HCC) 07/2014   due to bilateral adrenal hemorrhage   Antiphospholipid antibody positive 2016   cardiolipin ab positive, B2-glyco positive, lupus anticoagulant positive   Antiphospholipid syndrome (HCC)    Autoimmune hepatitis (HCC)    PSC   Cellulitis 09/2018   septic shock, compartment syndrome s/p fasciotomy RLE at [facility]   CVA (cerebral infarction) 04/2015   3 small areas of infarct in both the cerebrum as well as the cerebellum -- , located with diplopia   Cyclic neutropenia (HCC)    Status post bone marrow transplant   Dyslipidemia    Erectile dysfunction    Essential hypertension    Family history of premature coronary artery disease    2 brothers, one sister and father all with  CAD.  One sister with valve disease.   GERD (gastroesophageal reflux disease)    H/O acute cholangitis 08/2014   Complicated by septic shock, acute pancreatitis and acute choledocholithiasis;    Lupus anticoagulant with hemorrhagic disorder (HCC)    Previously on Xarelto. Now on warfarin   Overweight (BMI 25.0-29.9)    PSC (primary sclerosing cholangitis)    managed by Dr. 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. Likely cholangitis   ERCP W/ PLASTIC STENT PLACEMENT  Sep 09 2014   [facility] - Dr. Benedetto Goad   ERCP W/ SPHINCTEROTOMY AND BALLOON DILATION  11/10/2014   [facility] --  Removal of biliary stent,, to PACU duodenitis   ETHMOIDECTOMY Bilateral 08/04/2022   Procedure: BILATERAL ENDOSCOPIC ETHMOIDECTOMY;  Surgeon: Newman Pies, MD;  Location[facility] OR;  Service: ENT;  Laterality: Bilateral;   LAPAROSCOPIC CHOLECYSTECTOMY W/ CHOLANGIOGRAPHY  November 13 2014   [facility], Dr. Sylvie Farrier [place]   MAXILLARY ANTROSTOMY Bilateral 08/04/2022   Procedure: BILATERAL ENDOSCOPIC MAXILLARY ANTROSTOMY WITH TISSUE REMOVAL;  Surgeon: Newman Pies, MD;  Location: [facility] OR;  Service: ENT;  Laterality: Bilateral;   NASAL SEPTOPLASTY W/  TURBINOPLASTY Bilateral 08/04/2022   Procedure: NASAL SEPTOPLASTY WITH BILATERAL TURBINATE REDUCTION;  Surgeon: Newman Pies, MD;  Location: [facility] OR;  Service: ENT;  Laterality: Bilateral;   NM MYOCAR PERF WALL MOTION  06/28/2006   EF 70% , LV systolic fx norm.   ROTATOR CUFF REPAIR     TRANSTHORACIC ECHOCARDIOGRAM  04/2015   No pericardial effusion. No shunt. Full study: Basal septal LVH. Pseudo-normal filling (GR 2 DD). EF 57%.   TRANSTHORACIC ECHOCARDIOGRAM  08/2015   [facility]): Normal LV size. Basal septal hypertrophy. EF 57%. Pseudo-normal filling. Mild focal aortic valve thickening. (Study was done to evaluate for possible endocarditis)   Current Outpatient Medications on File Prior to Visit  Medication Sig Dispense Refill   Calcium Carb-Cholecalciferol (CALCIUM + VITAMIN D3 PO) Take 1 tablet by mouth daily.     clonazePAM (KLONOPIN) 0.5 MG tablet Take 0.5 mg by mouth 2 (two) times daily as needed for anxiety.     ezetimibe (ZETIA) 10 MG tablet TAKE ONE TABLET BY MOUTH DAILY 90 tablet 3   ferrous sulfate 325 (65 FE) MG tablet Take 325 mg by mouth daily with breakfast.     fludrocortisone (FLORINEF) 0.1 MG tablet Take 0.1 mg by mouth daily.     gabapentin (NEURONTIN) 300 MG capsule TAKE 1 CAPSULE BY MOUTH 3 TIMES A DAY MAY INCREASE TO UP TO 6 CAPSULES A DAY 180 capsule 0   guaiFENesin (MUCINEX) 600 MG 12 hr tablet Take 600-1,200 mg by mouth 2 (two) times daily as needed for to loosen phlegm or cough.     inclisiran (LEQVIO) 284 MG/1.5ML SOSY injection Inject 1.5 mLs (284 mg total) into the skin.     levothyroxine (SYNTHROID) 75 MCG tablet Take 1 tablet (75 mcg total) by mouth daily before breakfast. Alternate taking 75 mcg  ( whole tablet)  with 37.5 mcg ( 1/2 tablet) by mouth every other day 90 tablet 3   magnesium oxide (MAG-OX) 400 MG tablet Take 400 mg by mouth daily.     metFORMIN (GLUCOPHAGE-XR) 500 MG 24 hr tablet Take 500 mg by mouth 2 (two) times daily with a meal.     metoprolol succinate  (TOPROL-XL) 50 MG 24 hr tablet TAKE 1 TABLET BY MOUTH DAILY WITH OR IMMEDIATELY FOLLOWING A MEAL 90 tablet 2   mometasone (NASONEX) 50 MCG/ACT nasal spray Place 2 sprays into the nose daily as needed (allergies).     pantoprazole (PROTONIX) 40 MG tablet TAKE 1 TABLET BY MOUTH 2 TIMES A DAY 180 tablet 0   predniSONE (DELTASONE) 1 MG tablet Take 8mg  PO Q AM and 2.5mg  PO Q PM (Patient taking differently: Take 2 mg by mouth See admin instructions. Take 7 mg PO Q AM and 2.5mg  PO Q PM, taking with 5 mg) 300 tablet 3   predniSONE (DELTASONE) 5 MG tablet Take 1 tablet (5 mg total) by mouth in the morning and at bedtime. (Patient taking differently: Take 2.5-5 mg by mouth See admin instructions. 5  mg with 2 mg every morning (7 mg), and 2.5 mg in the evening) 60 tablet 0   Probiotic Product (PROBIOTIC DAILY PO) Take 1 capsule by mouth daily.     sertraline (ZOLOFT) 50 MG tablet Take 1 tablet (50 mg total) by mouth daily. 30 tablet 11   ursodiol (ACTIGALL) 500 MG tablet TAKE 1 BY MOUTH 2 TIMES A DAY 60 tablet 0   No current facility-administered medications on file prior to visit.   Allergies  Allergen Reactions   Biaxin [Clarithromycin] Rash    SJS   Keflex [Cephalexin] Hives, Itching and Rash   Macrolides And Ketolides Other (See Comments)    Unknown   Erythromycin     Was told not to take d/t medical problems   Penicillins Rash   Social History   Socioeconomic History   Marital status: Married    Spouse name: Not on file   Number of children: 2   Years of education: Not on file   Highest education level: Associate degree: occupational, Scientist, product/process development, or vocational program  Occupational History    Employer: [employer]  Tobacco Use   Smoking status: Former    Current packs/day: 0.50    Types: Cigarettes   Smokeless tobacco: Never  Vaping Use   Vaping status: Never Used  Substance and Sexual Activity   Alcohol use: Not Currently    Alcohol/week: 3.0 standard drinks of alcohol    Types: 3 Standard  drinks or equivalent per week    Comment: occassional   Drug use: No   Sexual activity: Not Currently    Birth control/protection: Surgical, None  Other Topics Concern   Not on file  Social History Narrative   He is a married father of 2 with 3 stepchildren.  He has 3 grandchildren his own and 3 step grandchildren.  He works as a Curator for [employer].  He lives with his wife, [name], of 8 years.  He does not, and never did smoke.  He takes occasional alcohol beverage but nothing significant.  He does not do routine exercise, but does walk a lot at work.   Social Determinants of Health   Financial Resource Strain: Low Risk  (02/26/2023)   Overall Financial Resource Strain (CARDIA)    Difficulty of Paying Living Expenses: Not hard at all  Food Insecurity: No Food Insecurity (02/26/2023)   Hunger Vital Sign    Worried About Running Out of Food in the Last Year: Never true    Ran Out of Food in the Last Year: Never true  Transportation Needs: No Transportation Needs (02/26/2023)   PRAPARE - Administrator, Civil Service (Medical): No    Lack of Transportation (Non-Medical): No  Physical Activity: Inactive (02/26/2023)   Exercise Vital Sign    Days of Exercise per Week: 0 days    Minutes of Exercise per Session: 0 min  Stress: No Stress Concern Present (02/26/2023)   [employer] of Occupational Health - Occupational Stress Questionnaire    Feeling of Stress : Only a little  Social Connections: Moderately Integrated (02/26/2023)   Social Connection and Isolation Panel [NHANES]    Frequency of Communication with Friends and Family: More than three times a week    Frequency of Social Gatherings with Friends and Family: Once a week    Attends Religious Services: 1 to 4 times per year    Active Member of [employer] or Organizations: No    Attends Banker Meetings: Never  Marital Status: Married  Catering manager Violence: Not At Risk (02/22/2023)   Humiliation, Afraid,  Rape, and Kick questionnaire    Fear of Current or Ex-Partner: No    Emotionally Abused: No    Physically Abused: No    Sexually Abused: No     Review of Systems     Objective:   Physical Exam Constitutional:      General: He is not in acute distress.    Appearance: He is not diaphoretic.  HENT:     Head: Normocephalic and atraumatic.     Right Ear: Tympanic membrane, ear canal and external ear normal.     Left Ear: Tympanic membrane, ear canal and external ear normal.     Nose: Congestion and rhinorrhea present.     Right Sinus: No maxillary sinus tenderness or frontal sinus tenderness.     Left Sinus: No maxillary sinus tenderness or frontal sinus tenderness.  Neck:     Thyroid: No thyromegaly.     Vascular: No JVD.     Trachea: No tracheal deviation.  Cardiovascular:     Rate and Rhythm: Regular rhythm.     Heart sounds: No murmur heard.    No friction rub. No gallop.  Pulmonary:     Effort: No respiratory distress.     Breath sounds: No stridor. Wheezing and rhonchi present. No rales.  Chest:     Chest wall: No tenderness.  Abdominal:     General: Abdomen is flat. Bowel sounds are normal. There is no distension.     Tenderness: There is abdominal tenderness in the left lower quadrant.  Musculoskeletal:     Cervical back: Normal range of motion and neck supple.  Lymphadenopathy:     Cervical: No cervical adenopathy.  Skin:    General: Skin is warm.     Coloration: Skin is not pale.     Findings: No erythema or rash.  Neurological:     Mental Status: He is alert and oriented to person, place, and time.     Cranial Nerves: No cranial nerve deficit.     Motor: No abnormal muscle tone.     Coordination: Coordination normal.     Deep Tendon Reflexes: Reflexes are normal and symmetric. Reflexes normal.          Assessment & Plan:  History of CVA (cerebrovascular accident)  Adrenal insufficiency (Addison's disease) (HCC)  Hypothyroidism, unspecified type -  Plan: CANCELED: TSH  Controlled type 2 diabetes mellitus with complication, without long-term current use of insulin (HCC) - Plan: Hemoglobin A1c, CBC with Differential/Platelet, COMPLETE METABOLIC PANEL WITH GFR, Lipid panel, Microalbumin/Creatinine Ratio, Urine BP is excellent.  Check HGA1C.  Goal is less than 6.5.  Goal LDL is less than 70.  Check cbc, cmp, FLP.  TSH is UTD.  Recommended a COVID shot.

## 2023-03-01 NOTE — Patient Instructions (Signed)
Description   Continue taking Warfarin 1 tablet daily except 1.5 tablets on Mondays and Fridays.  Stay consistent with greens each week.  Recheck INR in 8 weeks.  Coumadin [facility] [phone]

## 2023-03-02 ENCOUNTER — Other Ambulatory Visit: Payer: Self-pay | Admitting: Family Medicine

## 2023-03-02 ENCOUNTER — Other Ambulatory Visit: Payer: Self-pay

## 2023-03-02 DIAGNOSIS — Z0000 Encounter for general adult medical examination without abnormal findings: Secondary | ICD-10-CM

## 2023-03-02 DIAGNOSIS — E118 Type 2 diabetes mellitus with unspecified complications: Secondary | ICD-10-CM

## 2023-03-02 LAB — COMPLETE METABOLIC PANEL WITH GFR
AG Ratio: 1.5 (calc) (ref 1.0–2.5)
ALT: 24 U/L (ref 9–46)
AST: 25 U/L (ref 10–35)
Albumin: 3.8 g/dL (ref 3.6–5.1)
Alkaline phosphatase (APISO): 95 U/L (ref 35–144)
BUN: 23 mg/dL (ref 7–25)
CO2: 22 mmol/L (ref 20–32)
Calcium: 8.7 mg/dL (ref 8.6–10.3)
Chloride: 102 mmol/L (ref 98–110)
Creat: 1.27 mg/dL (ref 0.70–1.35)
Globulin: 2.5 g/dL (ref 1.9–3.7)
Glucose, Bld: 236 mg/dL — ABNORMAL HIGH (ref 65–99)
Potassium: 4.6 mmol/L (ref 3.5–5.3)
Sodium: 137 mmol/L (ref 135–146)
Total Bilirubin: 0.5 mg/dL (ref 0.2–1.2)
Total Protein: 6.3 g/dL (ref 6.1–8.1)
eGFR: 62 mL/min/{1.73_m2} (ref >=60)

## 2023-03-02 LAB — CBC WITH DIFFERENTIAL/PLATELET
Absolute Lymphocytes: 864 {cells}/uL (ref 850–3900)
Absolute Monocytes: 378 {cells}/uL (ref 200–950)
Basophils Absolute: 32 {cells}/uL (ref 0–200)
Basophils Relative: 0.5 %
Eosinophils Absolute: 51 {cells}/uL (ref 15–500)
Eosinophils Relative: 0.8 %
HCT: 43.4 % (ref 38.5–50.0)
Hemoglobin: 14.2 g/dL (ref 13.2–17.1)
MCH: 29.4 pg (ref 27.0–33.0)
MCHC: 32.7 g/dL (ref 32.0–36.0)
MCV: 89.9 fL (ref 80.0–100.0)
MPV: 9.7 fL (ref 7.5–12.5)
Monocytes Relative: 5.9 %
Neutro Abs: 5075 {cells}/uL (ref 1500–7800)
Neutrophils Relative %: 79.3 %
Platelets: 255 10*3/uL (ref 140–400)
RBC: 4.83 10*6/uL (ref 4.20–5.80)
RDW: 13 % (ref 11.0–15.0)
Total Lymphocyte: 13.5 %
WBC: 6.4 10*3/uL (ref 3.8–10.8)

## 2023-03-02 LAB — LIPID PANEL
Cholesterol: 149 mg/dL (ref <200)
HDL: 42 mg/dL (ref >=40)
Non-HDL Cholesterol (Calc): 107 mg/dL (ref <130)
Total CHOL/HDL Ratio: 3.5 (calc) (ref <5.0)
Triglycerides: 405 mg/dL — ABNORMAL HIGH (ref <150)

## 2023-03-02 LAB — HEMOGLOBIN A1C
Hgb A1c MFr Bld: 8.1 %{Hb} — ABNORMAL HIGH (ref <5.7)
Mean Plasma Glucose: 186 mg/dL
eAG (mmol/L): 10.3 mmol/L

## 2023-03-02 LAB — MICROALBUMIN / CREATININE URINE RATIO
Creatinine, Urine: 36 mg/dL (ref 20–320)
Microalb Creat Ratio: 8 mg/g{creat} (ref <30)
Microalb, Ur: 0.3 mg/dL

## 2023-03-02 MED ORDER — EMPAGLIFLOZIN 25 MG PO TABS: 25.0000 mg | ORAL_TABLET | Freq: Every day | ORAL | 1 refills | Status: DC

## 2023-03-02 MED ORDER — METFORMIN HCL ER 500 MG PO TB24: 1000.0000 mg | ORAL_TABLET | Freq: Two times a day (BID) | ORAL | 1 refills | Status: DC

## 2023-03-02 MED ORDER — URSODIOL 500 MG PO TABS: 500.0000 mg | ORAL_TABLET | Freq: Two times a day (BID) | ORAL | 1 refills | Status: DC

## 2023-03-16 ENCOUNTER — Other Ambulatory Visit: Payer: Self-pay | Admitting: Family Medicine

## 2023-03-19 NOTE — Telephone Encounter (Signed)
Prescription Request  03/19/2023  LOV: 03/01/2023  What is the name of the medication or equipment? empagliflozin (JARDIANCE) 25 MG TABS tablet [[id number]]   Have you contacted your pharmacy to request a refill? Yes   Which pharmacy would you like this sent to?  [facility] [id number] [place], [place] - [address] [place] [address] Phone: [phone] Fax: [phone]    Patient notified that their request is being sent to the clinical staff for review and that they should receive a response within 2 business days.   Please advise at [facility] [phone]

## 2023-03-19 NOTE — Telephone Encounter (Signed)
Requested Prescriptions  Pending Prescriptions Disp Refills   sertraline (ZOLOFT) 50 MG tablet [Pharmacy Med Name: SERTRALINE HCL 50 MG TABLET] 30 tablet 11    Sig: TAKE 1 TABLET BY MOUTH DAILY     Psychiatry:  Antidepressants - SSRI - sertraline Failed - 03/19/2023 11:06 AM      Failed - Valid encounter within last 6 months    Recent Outpatient Visits           1 year ago History of CVA (cerebrovascular accident)   [facility] Donita Brooks, MD   1 year ago Bacterial sinusitis   [facility] Valentino Nose, NP   2 years ago Septic olecranon bursitis of left elbow   [facility] Tanya Nones, Priscille Heidelberg, MD   2 years ago Left elbow pain   [facility] Valentino Nose, NP   2 years ago Irregular heart beats   [facility] Pickard, Priscille Heidelberg, MD       Future Appointments             In 1 month Pickard, Priscille Heidelberg, MD [facility]            Passed - AST in normal range and within 360 days    AST  Date Value Ref Range Status  03/01/2023 25 10 - 35 U/L Final         Passed - ALT in normal range and within 360 days    ALT  Date Value Ref Range Status  03/01/2023 24 9 - 46 U/L Final         Passed - Completed PHQ-2 or PHQ-9 in the last 360 days

## 2023-04-11 ENCOUNTER — Other Ambulatory Visit: Payer: Self-pay | Admitting: Cardiology

## 2023-04-14 ENCOUNTER — Other Ambulatory Visit: Payer: Self-pay | Admitting: Family Medicine

## 2023-04-24 ENCOUNTER — Emergency Department (HOSPITAL_BASED_OUTPATIENT_CLINIC_OR_DEPARTMENT_OTHER)
Admission: EM | Admit: 2023-04-24 | Discharge: 2023-04-24 | Disposition: A | Discharge status: Home / Self Care | Payer: Medicare Other | Attending: Emergency Medicine | Admitting: Emergency Medicine

## 2023-04-24 ENCOUNTER — Emergency Department (HOSPITAL_BASED_OUTPATIENT_CLINIC_OR_DEPARTMENT_OTHER): Payer: Medicare Other

## 2023-04-24 ENCOUNTER — Encounter (HOSPITAL_BASED_OUTPATIENT_CLINIC_OR_DEPARTMENT_OTHER): Payer: Self-pay

## 2023-04-24 ENCOUNTER — Other Ambulatory Visit: Payer: Self-pay

## 2023-04-24 DIAGNOSIS — S02113A Unspecified occipital condyle fracture, initial encounter for closed fracture: Secondary | ICD-10-CM

## 2023-04-24 DIAGNOSIS — S0101XA Laceration without foreign body of scalp, initial encounter: Secondary | ICD-10-CM | POA: Diagnosis not present

## 2023-04-24 DIAGNOSIS — S0211BA Type I occipital condyle fracture, left side, initial encounter for closed fracture: Secondary | ICD-10-CM | POA: Diagnosis not present

## 2023-04-24 DIAGNOSIS — I2510 Atherosclerotic heart disease of native coronary artery without angina pectoris: Secondary | ICD-10-CM | POA: Insufficient documentation

## 2023-04-24 DIAGNOSIS — Z86718 Personal history of other venous thrombosis and embolism: Secondary | ICD-10-CM | POA: Diagnosis not present

## 2023-04-24 DIAGNOSIS — W19XXXA Unspecified fall, initial encounter: Secondary | ICD-10-CM | POA: Insufficient documentation

## 2023-04-24 DIAGNOSIS — Z7901 Long term (current) use of anticoagulants: Secondary | ICD-10-CM | POA: Diagnosis not present

## 2023-04-24 DIAGNOSIS — S199XXA Unspecified injury of neck, initial encounter: Secondary | ICD-10-CM | POA: Diagnosis present

## 2023-04-24 MED ORDER — PREDNISONE 2.5 MG PO TABS: 2.5000 mg | ORAL_TABLET | Freq: Once | ORAL | Status: DC

## 2023-04-24 MED ORDER — OXYCODONE-ACETAMINOPHEN 5-325 MG PO TABS: 1.0000 | ORAL_TABLET | Freq: Four times a day (QID) | ORAL | 0 refills | Status: AC | PRN

## 2023-04-24 MED ORDER — PREDNISONE 5 MG/5ML PO SOLN: 2.5000 mg | Freq: Once | ORAL | Status: DC

## 2023-04-24 MED ORDER — LIDOCAINE-EPINEPHRINE-TETRACAINE (LET) TOPICAL GEL
3.0000 mL | Freq: Once | TOPICAL | Status: AC
  Administered 2023-04-24: 3 mL via TOPICAL
  Filled 2023-04-24: qty 3

## 2023-04-24 MED ORDER — OXYCODONE HCL 5 MG PO TABS
5.0000 mg | ORAL_TABLET | Freq: Once | ORAL | Status: AC
  Administered 2023-04-24: 5 mg via ORAL
  Filled 2023-04-24: qty 1

## 2023-04-24 MED ORDER — PREDNISOLONE SODIUM PHOSPHATE 15 MG/5ML PO SOLN
2.5000 mg | Freq: Once | ORAL | Status: AC
  Administered 2023-04-24: 2.5 mg via ORAL
  Filled 2023-04-24: qty 1

## 2023-04-24 NOTE — ED Notes (Signed)
 Pt discharged home after verbalizing understanding of discharge instructions; nad noted.

## 2023-04-24 NOTE — Discharge Instructions (Signed)
 It was a pleasure taking care of you here in the emergency department  You have #3 sutures in your scalp these will need to be removed after about 1 week.  This can be done in primary care office or urgent care  You have a hard cervical collar on your neck due to your fracture of your occipital condyle.  Neurosurgery states that you can remove this when you get in the shower however please use limited range of motion  I have written you for a short course of pain medicine.  Take as prescribed  Make sure to call the neurosurgery office when they open after the holiday to follow-up in about 3 weeks  Return for new or worsening symptoms such as severe headache, worsening neck pain, numbness or weakness to your extremities

## 2023-04-24 NOTE — ED Triage Notes (Signed)
 In for eval of mechanical fall today at approx 1-1.5 hrs ago. Hit head on block wall. Positive for blood thinners - coumadin . Negative LOC. Laceration to top of head. Positive for neck pain - C-collar placed at triage. Took tylenol  after injury. Wife placed dressing on head.

## 2023-04-24 NOTE — ED Provider Notes (Signed)
 [place] [facility]MENT AT [facility] Provider Note   CSN: [id number] Arrival date & time: 04/24/23  1247    History  Chief Complaint  Patient presents with   Kenneth    Jamesmichael Cline is a 67 y.o. 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 Occurred 1-1.5 hours ago.Some neck pain, took tylenol  PTA.  States he was changing a water filter under the house he tripped, lunged forward to catch himself hitting the top part of his head on a concrete wall.  Suffered a laceration.  He denies any real head pain however states he hurts where his head and his neck meet.  He denies any numbness, weakness.  No nausea, vomiting.  HPI     Home Medications Prior to Admission medications   Medication Sig Start Date End Date Taking? Authorizing Provider  oxyCODONE -acetaminophen  (PERCOCET/ROXICET) 5-325 MG tablet Take 1 tablet by mouth every 6 (six) hours as needed for severe pain (pain score 7-10). 04/24/23  Yes Selina Tapper A, PA-C  warfarin (COUMADIN ) 5 MG tablet Take 5 mg by mouth daily. 7.5 mg on Mondays and Fridays   Yes [provider]  Calcium  Carb-Cholecalciferol (CALCIUM  + VITAMIN D3 PO) Take 1 tablet by mouth daily.    [provider]  clonazePAM  (KLONOPIN ) 0.5 MG tablet Take 0.5 mg by mouth 2 (two) times daily as needed for anxiety.    [provider]  empagliflozin  (JARDIANCE ) 25 MG TABS tablet Take 1 tablet (25 mg total) by mouth daily before breakfast. 03/02/23   Duanne Butler DASEN, MD  ezetimibe  (ZETIA ) 10 MG tablet TAKE 1 TABLET BY MOUTH DAILY 04/11/23   Anner Alm ORN, MD  ferrous sulfate 325 (65 FE) MG tablet Take 325 mg by mouth daily with breakfast.    [provider]  fludrocortisone  (FLORINEF ) 0.1 MG tablet Take 0.1 mg by mouth daily.    [provider]  gabapentin  (NEURONTIN ) 300 MG capsule TAKE 1 CAPSULE BY MOUTH 3 TIMES A DAY MAY INCREASE TO UP TO 6 CAPSULES A DAY 11/03/22   Duanne Butler DASEN, MD  guaiFENesin  (MUCINEX ) 600 MG 12 hr tablet Take 600-1,200 mg by mouth 2 (two) times daily as needed for to loosen phlegm or cough.    [provider]  inclisiran (LEQVIO ) 284 MG/1.5ML SOSY injection Inject 1.5 mLs (284 mg total) into the skin. 12/05/22   Anner Alm ORN, MD  levothyroxine  (SYNTHROID ) 75 MCG tablet Take 1 tablet (75 mcg total) by mouth daily before breakfast. Alternate taking 75 mcg  ( whole tablet)  with 37.5 mcg ( 1/2 tablet) by mouth every other day 08/14/22   Duanne Butler DASEN, MD  magnesium  oxide (MAG-OX) 400 MG tablet Take 400 mg by mouth daily.    [provider]  metFORMIN  (GLUCOPHAGE -XR) 500 MG 24 hr tablet Take 2 tablets (1,000 mg total) by mouth 2 (two) times daily with a meal. 03/02/23   Duanne Butler DASEN, MD  metoprolol  succinate (TOPROL -XL) 50 MG 24 hr tablet TAKE 1 TABLET BY MOUTH DAILY WITH OR IMMEDIATELY FOLLOWING A MEAL 11/02/22   Anner Alm ORN, MD  mometasone (NASONEX) 50 MCG/ACT nasal spray Place 2 sprays into the nose daily as needed (allergies).    [provider]  pantoprazole  (PROTONIX ) 40 MG tablet TAKE 1 TABLET BY MOUTH 2 TIMES A DAY 04/16/23   Duanne Butler DASEN, MD  predniSONE  (DELTASONE ) 1 MG tablet Take 8mg  PO Q AM and 2.5mg  PO Q PM  Patient taking differently: Take 2 mg by mouth See admin instructions. Take 7 mg PO Q AM and 2.5mg  PO Q PM, taking with 5 mg 05/20/21   Duanne Butler DASEN, MD  predniSONE  (DELTASONE ) 5 MG tablet Take 1 tablet (5 mg total) by mouth in the morning and at bedtime. Patient taking differently: Take 2.5-5 mg by mouth See admin instructions. 5 mg with 2 mg every morning (7 mg), and 2.5 mg in the evening 01/31/22   Duanne Butler DASEN, MD  Probiotic Product (PROBIOTIC DAILY PO) Take 1 capsule by mouth daily.    [provider]  sertraline  (ZOLOFT ) 50 MG tablet TAKE 1 TABLET BY MOUTH DAILY 03/19/23   Duanne Butler DASEN, MD  ursodiol  (ACTIGALL ) 500 MG tablet Take 1 tablet (500 mg total) by mouth  in the morning and at bedtime. 03/02/23   Duanne Butler DASEN, MD     Allergies    Biaxin [clarithromycin], Keflex [cephalexin], Macrolides and ketolides, Erythromycin, and Penicillins    Review of Systems   Review of Systems  Constitutional: Negative.   HENT: Negative.    Respiratory: Negative.    Cardiovascular: Negative.   Gastrointestinal: Negative.   Genitourinary: Negative.   Musculoskeletal:  Positive for neck pain. Negative for arthralgias, back pain, gait problem, joint swelling, myalgias and neck stiffness.  Skin: Negative.   Neurological:  Positive for headaches. Negative for dizziness, tremors, seizures, syncope, facial asymmetry, speech difficulty, weakness, light-headedness and numbness.  All other systems reviewed and are negative.   Physical Exam Updated Vital Signs BP 126/82 (BP Location: Right Arm)   Pulse 75   Temp 97.9 F (36.6 C) (Oral)   Resp 16   Ht 5' 8 (1.727 m)   Wt 79.4 kg   SpO2 97%   BMI 26.61 kg/m  Physical Exam Vitals and nursing note reviewed.  Constitutional:      General: He is not in acute distress.    Appearance: He is well-developed. He is not ill-appearing, toxic-appearing or diaphoretic.  HENT:     Head: Normocephalic.     Comments: #2  Jagged lacerations superior aspect scalp, #1 appears to be a deep 4 cm skin tear.  #2 laceration varies in thickness, no pulsatile bleeding to either Eyes:     Pupils: Pupils are equal, round, and reactive to light.  Neck:     Trachea: Trachea and phonation normal.     Comments: C-collar in place Tenderness over C1-C3 region. Cardiovascular:     Rate and Rhythm: Normal rate and regular rhythm.  Pulmonary:     Effort: Pulmonary effort is normal. No respiratory distress.  Abdominal:     General: There is no distension.     Palpations: Abdomen is soft.  Musculoskeletal:        General: Normal range of motion.  Skin:    General: Skin is warm and dry.  Neurological:     General: No focal deficit  present.     Mental Status: He is alert and oriented to person, place, and time.     Cranial Nerves: Cranial nerves 2-12 are intact.     Sensory: Sensation is intact.     Motor: Motor function is intact.     Coordination: Coordination is intact.     Gait: Gait is intact.     Comments: Cranial nerves II through XII grossly intact Equal handgrip bilaterally Intact sensation Intact reflexes upper and lower extremities Ambulatory    ED Results / Procedures / Treatments  Labs (all labs ordered are listed, but only abnormal results are displayed) Labs Reviewed - No data to display  EKG None  Radiology CT Head Wo Contrast Result Date: 04/24/2023 CLINICAL DATA:  Head trauma, minor (Age >= 65y) on blood thinner; Neck trauma (Age >= 65y) EXAM: CT HEAD WITHOUT CONTRAST CT CERVICAL SPINE WITHOUT CONTRAST TECHNIQUE: Multidetector CT imaging of the head and cervical spine was performed following the standard protocol without intravenous contrast. Multiplanar CT image reconstructions of the cervical spine were also generated. RADIATION DOSE REDUCTION: This exam was performed according to the departmental dose-optimization program which includes automated exposure control, adjustment of the mA and/or kV according to patient size and/or use of iterative reconstruction technique. COMPARISON:  None Available. FINDINGS: CT HEAD FINDINGS Brain: No evidence of acute infarction, hemorrhage, hydrocephalus, extra-axial collection or mass lesion/mass effect. Remote cerebellar infarcts. Vascular: Calcific atherosclerosis. No hyperdense vessel identified. Skull: No acute fracture. Sinuses/Orbits: Prior endoscopic sinus surgery with mucosal thickening. No acute orbital findings. Other: No mastoid effusions. CT CERVICAL SPINE FINDINGS Alignment: No substantial sagittal subluxation. Mild reversal of the normal cervical lordosis. Skull base and vertebrae: Acute nondisplaced fracture of the left occipital condyle (series  7, image 14 and series 6, image 64) which extends to the left lateral C1-C2 joint. Vertebral body heights are maintained. Soft tissues and spinal canal: No prevertebral fluid or swelling. No visible canal hematoma. Disc levels:  Moderate lower cervical degenerative change. Upper chest: Visualized lung apices are clear. IMPRESSION: 1. Acute nondisplaced fracture of the left occipital condyle which extends to the left lateral C1-C2 joint. 2. No evidence of traumatic malalignment. An MRI could better assess for ligamentous injury if clinically warranted. 3. No evidence of acute intracranial abnormality. Findings discussed with Dr. Jerrol via telephone at 4 p.m. Electronically Signed   By: Gilmore GORMAN Molt M.D.   On: 04/24/2023 16:04   CT Cervical Spine Wo Contrast Result Date: 04/24/2023 CLINICAL DATA:  Head trauma, minor (Age >= 65y) on blood thinner; Neck trauma (Age >= 65y) EXAM: CT HEAD WITHOUT CONTRAST CT CERVICAL SPINE WITHOUT CONTRAST TECHNIQUE: Multidetector CT imaging of the head and cervical spine was performed following the standard protocol without intravenous contrast. Multiplanar CT image reconstructions of the cervical spine were also generated. RADIATION DOSE REDUCTION: This exam was performed according to the departmental dose-optimization program which includes automated exposure control, adjustment of the mA and/or kV according to patient size and/or use of iterative reconstruction technique. COMPARISON:  None Available. FINDINGS: CT HEAD FINDINGS Brain: No evidence of acute infarction, hemorrhage, hydrocephalus, extra-axial collection or mass lesion/mass effect. Remote cerebellar infarcts. Vascular: Calcific atherosclerosis. No hyperdense vessel identified. Skull: No acute fracture. Sinuses/Orbits: Prior endoscopic sinus surgery with mucosal thickening. No acute orbital findings. Other: No mastoid effusions. CT CERVICAL SPINE FINDINGS Alignment: No substantial sagittal subluxation. Mild  reversal of the normal cervical lordosis. Skull base and vertebrae: Acute nondisplaced fracture of the left occipital condyle (series 7, image 14 and series 6, image 64) which extends to the left lateral C1-C2 joint. Vertebral body heights are maintained. Soft tissues and spinal canal: No prevertebral fluid or swelling. No visible canal hematoma. Disc levels:  Moderate lower cervical degenerative change. Upper chest: Visualized lung apices are clear. IMPRESSION: 1. Acute nondisplaced fracture of the left occipital condyle which extends to the left lateral C1-C2 joint. 2. No evidence of traumatic malalignment. An MRI could better assess for ligamentous injury if clinically warranted. 3. No evidence of acute intracranial abnormality. Findings discussed with Dr. Jerrol  via telephone at 4 p.m. Electronically Signed   By: Gilmore GORMAN Molt M.D.   On: 04/24/2023 16:04    Procedures .Laceration Repair  Date/Time: 04/24/2023 4:56 PM  Performed by: Edie Einstein A, PA-C Authorized by: Edie Einstein LABOR, PA-C   Consent:    Consent obtained:  Verbal   Consent given by:  Patient   Risks, benefits, and alternatives were discussed: yes     Risks discussed:  Retained foreign body, tendon damage, vascular damage, poor wound healing, poor cosmetic result, pain, infection, need for additional repair and nerve damage   Alternatives discussed:  No treatment, delayed treatment, observation and referral Universal protocol:    Procedure explained and questions answered to patient or proxy's satisfaction: yes     Relevant documents present and verified: yes     Test results available: yes     Imaging studies available: yes     Required blood products, implants, devices, and special equipment available: yes     Site/side marked: yes     Immediately prior to procedure, a time out was called: yes     Patient identity confirmed:  Verbally with patient Laceration details:    Length (cm):  7.6 Pre-procedure  details:    Preparation:  Patient was prepped and draped in usual sterile fashion and imaging obtained to evaluate for foreign bodies Exploration:    Limited defect created (wound extended): no     Hemostasis achieved with:  Direct pressure   Imaging obtained comment:  CT   Imaging outcome: foreign body not noted     Wound exploration: wound explored through full range of motion and entire depth of wound visualized     Wound extent: no foreign body, no signs of injury, no tendon damage, no underlying fracture and no vascular damage     Contaminated: no   Treatment:    Area cleansed with:  Povidone-iodine   Amount of cleaning:  Extensive   Irrigation solution:  Sterile saline   Irrigation method:  Pressure wash   Debridement:  None   Undermining:  None   Scar revision: no   Skin repair:    Repair method:  Sutures   Suture size:  4-0   Suture material:  Prolene   Suture technique:  Simple interrupted   Number of sutures:  4 Approximation:    Approximation:  Loose Repair type:    Repair type:  Intermediate Post-procedure details:    Dressing:  Open (no dressing)   Procedure completion:  Tolerated well, no immediate complications .Ortho Injury Treatment  Date/Time: 04/24/2023 4:57 PM  Performed by: Edie Einstein A, PA-C Authorized by: Edie Einstein LABOR, PA-C   Consent:    Consent obtained:  Verbal   Consent given by:  Patient   Risks discussed:  Fracture, nerve damage, restricted joint movement, vascular damage, stiffness, recurrent dislocation and irreducible dislocation   Alternatives discussed:  No treatment, alternative treatment, immobilization, referral and delayed treatmentInjury location: spine Location details C1 Injury type: fracture Fracture type: vertebral process Pre-procedure neurovascular assessment: neurovascularly intact Pre-procedure distal perfusion: normal Pre-procedure neurological function: normal Pre-procedure range of motion:  normal  Anesthesia: Local anesthesia used: no  Patient sedated: NoManipulation performed: no Cranial tongs applied: no Immobilization: brace (C-collar) Splint type: c-collar. Splint Applied by: ED Nurse Post-procedure neurovascular assessment: post-procedure neurovascularly intact Post-procedure distal perfusion: normal Post-procedure neurological function: normal Post-procedure range of motion: normal       Medications Ordered in ED Medications  prednisoLONE  (ORAPRED ) 15 MG/5ML solution  2.5 mg (has no administration in time range)  lidocaine -EPINEPHrine -tetracaine  (LET) topical gel (3 mLs Topical Given 04/24/23 1535)  oxyCODONE  (Oxy IR/ROXICODONE ) immediate release tablet 5 mg (5 mg Oral Given 04/24/23 1600)    ED Course/ Medical Decision Making/ A&P Clinical Course as of 04/24/23 1703  Tue Apr 24, 2023  1601 Radiologist--Fx left occ condil at C1 [BH]  1652 Gerard Beck, Np with Neurosurgery. Place in hard collar. Can take off to shower. Fu in office in 3-4 weeks [BH]    Clinical Course User Index [BH] Mazelle Huebert A, PA-C    67 year old chronically anticoagulated here for evaluation of head injury after axial load injury for mechanical fall.  He has 2 jagged, stellate lacerations to his scalp.  No pulsatile bleeding.  He was placed in a c-collar on arrival from nurses.  Patient neurovascularly intact.  Will plan on CT imaging as well as pain control.  Discussed suture versus staple of his lacerations.  He would prefer suturing given he does not have hair on the top of his head.   #3 sutures placed see procedure note  Imaging personally viewed and interpreted:  CT imaging with occipital condyle fracture CT head without significant abnormality  Discussed results with patient.  Consult with Beck, NP with neurosurgery recommends placing c-collar, patient can follow-up in office in 3 to 4 weeks  Discussed results with patient.  His pain was managed here.  He  remained neurovascularly intact.  He initial c-collar was replaced with [place] collar.  Have him follow-up outpatient  The patient has been appropriately medically screened and/or stabilized in the ED. I have low suspicion for any other emergent medical condition which would require further screening, evaluation or treatment in the ED or require inpatient management.  Patient is hemodynamically stable and in no acute distress.  Patient able to ambulate in department prior to ED.  Evaluation does not show acute pathology that would require ongoing or additional emergent interventions while in the emergency department or further inpatient treatment.  I have discussed the diagnosis with the patient and answered all questions.  Pain is been managed while in the emergency department and patient has no further complaints prior to discharge.  Patient is comfortable with plan discussed in room and is stable for discharge at this time.  I have discussed strict return precautions for returning to the emergency department.  Patient was encouraged to follow-up with PCP/specialist refer to at discharge.                                Medical Decision Making Amount and/or Complexity of Data Reviewed Independent Historian: spouse External Data Reviewed: labs, radiology and notes. Radiology: ordered and independent interpretation performed. Decision-making details documented in ED Course.  Risk OTC drugs. Prescription drug management. Decision regarding hospitalization. Diagnosis or treatment significantly limited by social determinants of health.           Final Clinical Impression(s) / ED Diagnoses Final diagnoses:  Fall, initial encounter  Chronic anticoagulation  Closed fracture of left occipital condyle, initial encounter (HCC)    Rx / DC Orders ED Discharge Orders          Ordered    oxyCODONE -acetaminophen  (PERCOCET/ROXICET) 5-325 MG tablet  Every 6 hours PRN        04/24/23 1654               Sruti Ayllon A, PA-C 04/24/23 1703  Jerrol Agent, MD 04/24/23 [phone]

## 2023-04-26 ENCOUNTER — Ambulatory Visit: Payer: Medicare Other | Attending: Internal Medicine

## 2023-04-26 DIAGNOSIS — Z7901 Long term (current) use of anticoagulants: Secondary | ICD-10-CM | POA: Diagnosis not present

## 2023-04-26 DIAGNOSIS — I823 Embolism and thrombosis of renal vein: Secondary | ICD-10-CM | POA: Insufficient documentation

## 2023-04-26 LAB — POCT INR: INR: 3.4 — AB (ref 2.0–3.0)

## 2023-04-26 NOTE — Patient Instructions (Signed)
 Description   Only take 1/2 tablet today and then continue taking Warfarin 1 tablet daily except 1.5 tablets on Mondays and Fridays.  Stay consistent with greens each week.  Recheck INR in 4 weeks.  [facility]in [facility] [phone]

## 2023-04-27 ENCOUNTER — Other Ambulatory Visit: Payer: Self-pay | Admitting: Family Medicine

## 2023-04-27 MED ORDER — CYCLOBENZAPRINE HCL 10 MG PO TABS: 10.0000 mg | ORAL_TABLET | Freq: Three times a day (TID) | ORAL | 0 refills | Status: AC | PRN

## 2023-04-28 ENCOUNTER — Other Ambulatory Visit: Payer: Self-pay | Admitting: Family Medicine

## 2023-04-28 DIAGNOSIS — E118 Type 2 diabetes mellitus with unspecified complications: Secondary | ICD-10-CM

## 2023-05-01 ENCOUNTER — Encounter: Payer: Self-pay | Admitting: Family Medicine

## 2023-05-01 ENCOUNTER — Ambulatory Visit: Payer: Medicare Other | Admitting: Family Medicine

## 2023-05-01 VITALS — BP 118/62 | HR 53 | Ht 68.0 in | Wt 175.8 lb

## 2023-05-01 DIAGNOSIS — Z4802 Encounter for removal of sutures: Secondary | ICD-10-CM

## 2023-05-01 DIAGNOSIS — Z7984 Long term (current) use of oral hypoglycemic drugs: Secondary | ICD-10-CM | POA: Diagnosis not present

## 2023-05-01 DIAGNOSIS — E118 Type 2 diabetes mellitus with unspecified complications: Secondary | ICD-10-CM

## 2023-05-01 MED ORDER — OXYCODONE-ACETAMINOPHEN 5-325 MG PO TABS: 1.0000 | ORAL_TABLET | ORAL | 0 refills | Status: AC | PRN

## 2023-05-01 MED ORDER — EMPAGLIFLOZIN 25 MG PO TABS: 25.0000 mg | ORAL_TABLET | Freq: Every day | ORAL | 3 refills | Status: AC

## 2023-05-01 NOTE — Progress Notes (Signed)
 Subjective:    Patient ID: Kenneth Cline, male    DOB: August 22, 1955, 68 y.o.   MRN: [id number]   01/02/22 He has a very complicated past medical history. He has a history of lupus anticoagulant. 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  He is on chronic fludrocortisone  as well as prednisone  for this. He has a history of type 2 diabetes as well as hypothyroidism which is treated by endocrinology. He also has a history of acute cholangitis requiring ERCP and bile duct stenting.  This is due to primary sclerosing cholangitis.  Patient is on chronic prednisone  for this reason.    04/30/22  Hemoglobin A1c was elevated in November.  At that point I recommended increasing metformin  to 1000 mg twice a day and adding Jardiance  25 mg daily.  He recently was in the hospital after hitting his head working under his home.  He suffered a laceration to his scalp as well as a l fracture in the odontoid process of C1 extending into the lateral joint between C1 and C2.  He has an appointment to see neurosurgery at the end of this month.  He is currently in a hard cervical collar.  The patient does not have a defined laceration on the scalp.  Rather it is a large abrasion.  There are 3 separate areas where the skin edges have been applied with 3 simple interrupted Prolene sutures. Past Medical History:  Diagnosis Date   Adrenal hemorrhage (HCC) 07/2014   Following shoulder surgery   Adrenal insufficiency, primary, hemorrhagic (HCC) 07/2014   due to bilateral adrenal hemorrhage   Antiphospholipid antibody positive 2016   cardiolipin ab positive, B2-glyco positive, lupus anticoagulant positive   Antiphospholipid syndrome (HCC)    Autoimmune hepatitis (HCC)    PSC   Cellulitis 09/2018   septic shock, compartment syndrome s/p fasciotomy RLE at [facility]   CVA (cerebral infarction) 04/2015   3 small areas of  infarct in both the cerebrum as well as the cerebellum -- , located with diplopia   Cyclic neutropenia (HCC)    Status post bone marrow transplant   Dyslipidemia    Erectile dysfunction    Essential hypertension    Family history of premature coronary artery disease    2 brothers, one sister and father all with CAD.  One sister with valve disease.   GERD (gastroesophageal reflux disease)    H/O acute cholangitis 08/2014   Complicated by septic shock, acute pancreatitis and acute choledocholithiasis;    Lupus anticoagulant with hemorrhagic disorder (HCC)    Previously on Xarelto. Now on warfarin   Overweight (BMI 25.0-29.9)    PSC (primary sclerosing cholangitis)    managed by Dr. 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 Likely cholangitis   ERCP W/ PLASTIC STENT PLACEMENT  Sep 09 2014   [facility] - Dr. Norleen Sar   ERCP W/ SPHINCTEROTOMY AND BALLOON DILATION  11/10/2014   [facility] -- Removal of biliary stent,, to PACU duodenitis   ETHMOIDECTOMY Bilateral 08/04/2022   Procedure: BILATERAL ENDOSCOPIC ETHMOIDECTOMY;  Surgeon: Karis Clunes, MD;  Location: [facility] OR;  Service: ENT;  Laterality: Bilateral;    LAPAROSCOPIC CHOLECYSTECTOMY W/ CHOLANGIOGRAPHY  November 13 2014   [facility], Dr. Rosina Jansky [place]   MAXILLARY ANTROSTOMY Bilateral 08/04/2022   Procedure: BILATERAL ENDOSCOPIC MAXILLARY ANTROSTOMY WITH TISSUE REMOVAL;  Surgeon: Karis Clunes, MD;  Location: [facility] OR;  Service: ENT;  Laterality: Bilateral;   NASAL SEPTOPLASTY W/ TURBINOPLASTY Bilateral 08/04/2022   Procedure: NASAL SEPTOPLASTY WITH BILATERAL TURBINATE REDUCTION;  Surgeon: Karis Clunes, MD;  Location: [facility] OR;  Service: ENT;  Laterality: Bilateral;   NM MYOCAR PERF WALL MOTION  06/28/2006   EF 70% , LV systolic fx norm.   ROTATOR CUFF REPAIR     TRANSTHORACIC ECHOCARDIOGRAM  04/2015   No pericardial effusion. No shunt. Full study: Basal septal LVH. Pseudo-normal filling (GR 2 DD). EF 57%.   TRANSTHORACIC ECHOCARDIOGRAM  08/2015   [facility]): Normal LV size. Basal septal hypertrophy. EF 57%. Pseudo-normal filling. Mild focal aortic valve thickening. (Study was done to evaluate for possible endocarditis)   Current Outpatient Medications on File Prior to Visit  Medication Sig Dispense Refill   Calcium  Carb-Cholecalciferol (CALCIUM  + VITAMIN D3 PO) Take 1 tablet by mouth daily.     clonazePAM  (KLONOPIN ) 0.5 MG tablet Take 0.5 mg by mouth 2 (two) times daily as needed for anxiety.     cyclobenzaprine  (FLEXERIL ) 10 MG tablet Take 1 tablet (10 mg total) by mouth 3 (three) times daily as needed for muscle spasms. 30 tablet 0   ezetimibe  (ZETIA ) 10 MG tablet TAKE 1 TABLET BY MOUTH DAILY 90 tablet 0   ferrous sulfate 325 (65 FE) MG tablet Take 325 mg by mouth daily with breakfast.     fludrocortisone  (FLORINEF ) 0.1 MG tablet Take 0.1 mg by mouth daily.     gabapentin  (NEURONTIN ) 300 MG capsule TAKE 1 CAPSULE BY MOUTH 3 TIMES A DAY MAY INCREASE TO UP TO 6 CAPSULES A DAY 180 capsule 0   guaiFENesin  (MUCINEX ) 600 MG 12 hr tablet Take 600-1,200 mg by mouth 2 (two) times daily as needed for to loosen phlegm or cough.     inclisiran (LEQVIO ) 284 MG/1.5ML  SOSY injection Inject 1.5 mLs (284 mg total) into the skin.     JARDIANCE  25 MG TABS tablet TAKE 1 TABLET BY MOUTH DAILY BEFORE BREAKFAST 30 tablet 1   levothyroxine  (SYNTHROID ) 75 MCG tablet Take 1 tablet (75 mcg total) by mouth daily before breakfast. Alternate taking 75 mcg  ( whole tablet)  with 37.5 mcg ( 1/2 tablet) by mouth every other day 90 tablet 3   magnesium  oxide (MAG-OX) 400 MG tablet Take 400 mg by mouth daily.     metFORMIN  (GLUCOPHAGE -XR) 500 MG 24 hr tablet Take 2 tablets (1,000 mg total) by mouth 2 (two) times daily with a meal. 180 tablet 1   metoprolol  succinate (TOPROL -XL) 50 MG 24 hr tablet TAKE 1 TABLET BY MOUTH DAILY WITH OR IMMEDIATELY FOLLOWING A MEAL 90 tablet 2   mometasone (NASONEX) 50  MCG/ACT nasal spray Place 2 sprays into the nose daily as needed (allergies).     oxyCODONE -acetaminophen  (PERCOCET/ROXICET) 5-325 MG tablet Take 1 tablet by mouth every 6 (six) hours as needed for severe pain (pain score 7-10). 15 tablet 0   pantoprazole  (PROTONIX ) 40 MG tablet TAKE 1 TABLET BY MOUTH 2 TIMES A DAY 180 tablet 0   predniSONE  (DELTASONE ) 1 MG tablet Take 8mg  PO Q AM and 2.5mg  PO Q PM (Patient taking differently: Take 2 mg by mouth See admin instructions. Take 7 mg PO Q AM and 2.5mg  PO Q PM, taking with 5 mg) 300 tablet 3   predniSONE  (DELTASONE ) 5 MG tablet Take 1 tablet (5 mg total) by mouth in the morning and at bedtime. (Patient taking differently: Take 2.5-5 mg by mouth See admin instructions. 5 mg with 2 mg every morning (7 mg), and 2.5 mg in the evening) 60 tablet 0   Probiotic Product (PROBIOTIC DAILY PO) Take 1 capsule by mouth daily.     sertraline  (ZOLOFT ) 50 MG tablet TAKE 1 TABLET BY MOUTH DAILY 90 tablet 0   ursodiol  (ACTIGALL ) 500 MG tablet Take 1 tablet (500 mg total) by mouth in the morning and at bedtime. 180 tablet 1   warfarin (COUMADIN ) 5 MG tablet Take 5 mg by mouth daily. 7.5 mg on Mondays and Fridays     No current facility-administered medications on  file prior to visit.   Allergies  Allergen Reactions   Biaxin [Clarithromycin] Rash    SJS   Keflex [Cephalexin] Hives, Itching and Rash   Macrolides And Ketolides Other (See Comments)    Unknown   Erythromycin     Was told not to take d/t medical problems   Penicillins Rash   Social History   Socioeconomic History   Marital status: Married    Spouse name: Not on file   Number of children: 2   Years of education: Not on file   Highest education level: Associate degree: occupational, scientist, product/process development, or vocational program  Occupational History    Employer: [employer]  Tobacco Use   Smoking status: Former    Current packs/day: 0.50    Types: Cigarettes   Smokeless tobacco: Never  Vaping Use   Vaping status: Never Used  Substance and Sexual Activity   Alcohol use: Not Currently    Alcohol/week: 3.0 standard drinks of alcohol    Types: 3 Standard drinks or equivalent per week    Comment: occassional   Drug use: No   Sexual activity: Not Currently    Birth control/protection: Surgical, None  Other Topics Concern   Not on file  Social History Narrative   He is a married father of 2 with 3 stepchildren.  He has 3 grandchildren his own and 3 step grandchildren.  He works as a curator for [employer].  He lives with his wife, Marval, of 8 years.  He does not, and never did smoke.  He takes occasional alcohol beverage but nothing significant.  He does not do routine exercise, but does walk a lot at work.   Social Drivers of Corporate Investment Banker Strain: Low Risk  (04/29/2023)   Overall Financial Resource Strain (CARDIA)    Difficulty of Paying Living Expenses: Not hard at all  Food Insecurity: No Food Insecurity (04/29/2023)   Hunger Vital Sign    Worried About Running Out of Food in the Last Year: Never true    Ran Out of Food in the Last Year: Never true  Transportation Needs: No Transportation Needs (04/29/2023)   PRAPARE - Administrator, Civil Service (Medical): No    Lack of  Transportation (Non-Medical): No  Physical Activity: Inactive (04/29/2023)   Exercise Vital Sign    Days of Exercise per Week: 0 days    Minutes of Exercise per Session: 0 min  Stress: No Stress Concern Present (04/29/2023)   [employer] of Occupational Health - Occupational Stress Questionnaire    Feeling of Stress : Only a little  Social Connections: Moderately Integrated (04/29/2023)   Social Connection and Isolation Panel [NHANES]    Frequency of Communication with Friends and Family: More than three times a week    Frequency of Social Gatherings with Friends and Family: Once a week    Attends Religious Services: 1 to 4 times per year    Active Member of [employer] or Organizations: No    Attends Banker Meetings: Never    Marital Status: Married  Catering Manager Violence: Not At Risk (02/22/2023)   Humiliation, Afraid, Rape, and Kick questionnaire    Fear of Current or Ex-Partner: No    Emotionally Abused: No    Physically Abused: No    Sexually Abused: No     Review of Systems     Objective:   Physical Exam Constitutional:      General: He is not in acute distress.    Appearance: He is not diaphoretic.  HENT:     Head: Normocephalic and atraumatic.      Nose:     Right Sinus: No maxillary sinus tenderness or frontal sinus tenderness.     Left Sinus: No maxillary sinus tenderness or frontal sinus tenderness.  Neck:     Thyroid : No thyromegaly.     Vascular: No JVD.     Trachea: No tracheal deviation.  Cardiovascular:     Rate and Rhythm: Regular rhythm.     Heart sounds: No murmur heard.    No friction rub. No gallop.  Pulmonary:     Effort: No respiratory distress.     Breath sounds: No stridor. No wheezing, rhonchi or rales.  Chest:     Chest wall: No tenderness.  Abdominal:     General: Abdomen is flat. Bowel sounds are normal. There is no distension.     Tenderness: There is no abdominal tenderness.  Musculoskeletal:     Cervical back: Normal  range of motion and neck supple.  Lymphadenopathy:     Cervical: No cervical adenopathy.  Skin:    General: Skin is warm.     Coloration: Skin is not pale.     Findings: No erythema or rash.  Neurological:     Mental Status: He is alert and oriented to person, place, and time.     Cranial Nerves: No cranial nerve deficit.     Motor: No abnormal muscle tone.     Coordination: Coordination normal.     Deep Tendon Reflexes: Reflexes are normal and symmetric. Reflexes normal.     Diagram above, purple lines represent a complex abrasion on his scalp.  The red lines represent the 3 simple sutures.     Assessment & Plan:  Visit for suture removal  Controlled type 2 diabetes mellitus with complication, without long-term current use of insulin  (HCC) - Plan: empagliflozin  (JARDIANCE ) 25 MG TABS tablet Sutures were removed without difficulty.  Wound was reinforced with Steri-Strips.  Recommended he wear the hard cervical collar 24/7 until seen by neurosurgery.  That  appointment has already been made.  I refilled his oxycodone  to help him sleep at night.  We also discussed his diabetes.  He did start Jardiance  but he never increase metformin  to 1000 mg twice daily.  He is currently only taking 500 mg twice daily.  He wants to recheck his blood work in February prior to deciding whether to increase metformin  over concerns of diarrhea.  The other option would be to add Januvia at that time.  He will come in for fasting lab work in February

## 2023-05-08 ENCOUNTER — Ambulatory Visit: Payer: Medicare Other | Admitting: Family Medicine

## 2023-05-16 ENCOUNTER — Telehealth (INDEPENDENT_AMBULATORY_CARE_PROVIDER_SITE_OTHER): Payer: Self-pay | Admitting: Otolaryngology

## 2023-05-16 NOTE — Telephone Encounter (Signed)
LVM to confirm appt & location [id number] afm

## 2023-05-17 ENCOUNTER — Ambulatory Visit (INDEPENDENT_AMBULATORY_CARE_PROVIDER_SITE_OTHER): Payer: Medicare Other | Admitting: Otolaryngology

## 2023-05-17 ENCOUNTER — Encounter (INDEPENDENT_AMBULATORY_CARE_PROVIDER_SITE_OTHER): Payer: Self-pay

## 2023-05-17 VITALS — BP 126/85 | HR 69 | Ht 68.0 in | Wt 170.0 lb

## 2023-05-17 DIAGNOSIS — J322 Chronic ethmoidal sinusitis: Secondary | ICD-10-CM | POA: Diagnosis not present

## 2023-05-17 DIAGNOSIS — J320 Chronic maxillary sinusitis: Secondary | ICD-10-CM | POA: Diagnosis not present

## 2023-05-18 NOTE — Progress Notes (Signed)
Patient ID: Kenneth Cline, male   DOB: Aug 11, 1955, 68 y.o.   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  After the surgery, he continues to have recurrent sinusitis and recurrent crusting formation in his nasal cavities.  The patient returns today complaining of persistent nasal congestion and crusting.  He has also noted a foul-smelling odor from his nasal cavities.  Currently the patient denies any facial pain, fever, or visual change.   Anesthesia: Topical Xylocaine and Neo-Synephrine.   Description: The patient is placed upright in the exam chair. Both nasal cavities are sprayed with topical Xylocaine and Neo-Synephrine.  A 0 rigid endoscope is used for the examination. The scope is first advanced past the right nostril into the right nasal cavity. A significant amount of crusting is noted within the right nasal cavity and the maxillary/ethmoid cavities.  The crusting is removed with suction catheters and alligator forceps, which are inserted in parallel with the rigid endoscope.  After the debridement procedure, the maxillary antrum and the ethmoid cavities are noted to be widely patent.    The same procedure is then repeated on the left side without exception.  The sinus cavities are noted to be inflamed bilaterally.  Similar findings are again noted on the left. The patient tolerated the procedure well.   Follow up care: I would like to start the patient on medicated (mupirocin/budesonide/amphotericin B) nasal irrigation twice daily.  The patient will return for re-evaluation in approximately 2 months, sooner if needed.

## 2023-05-24 ENCOUNTER — Ambulatory Visit: Payer: Medicare Other | Attending: Cardiology | Admitting: *Deleted

## 2023-05-24 DIAGNOSIS — R760 Raised antibody titer: Secondary | ICD-10-CM | POA: Insufficient documentation

## 2023-05-24 DIAGNOSIS — I823 Embolism and thrombosis of renal vein: Secondary | ICD-10-CM

## 2023-05-24 DIAGNOSIS — D6859 Other primary thrombophilia: Secondary | ICD-10-CM | POA: Insufficient documentation

## 2023-05-24 DIAGNOSIS — Z7901 Long term (current) use of anticoagulants: Secondary | ICD-10-CM

## 2023-05-24 DIAGNOSIS — I639 Cerebral infarction, unspecified: Secondary | ICD-10-CM | POA: Diagnosis present

## 2023-05-24 DIAGNOSIS — D68312 Antiphospholipid antibody with hemorrhagic disorder: Secondary | ICD-10-CM | POA: Insufficient documentation

## 2023-05-24 LAB — POCT INR: INR: 3.2 — AB (ref 2.0–3.0)

## 2023-05-24 NOTE — Patient Instructions (Signed)
Description   Only take 1/2 tablet today and then continue taking Warfarin 1 tablet daily except 1.5 tablets on Mondays and Fridays.  Stay consistent with greens each week.  Recheck INR in 4 weeks.  Coumadin Clinic [phone] Have GI office fax over cardiac clearance request

## 2023-05-26 ENCOUNTER — Other Ambulatory Visit: Payer: Self-pay | Admitting: Family Medicine

## 2023-05-28 NOTE — Telephone Encounter (Signed)
Requested medication (s) are due for refill today: na   Requested medication (s) are on the active medication list: yes   Last refill:  03/19/23 #90 0 refills   Future visit scheduled: no   Notes to clinic:  no refill remain. Do you want to refill Rx?     Requested Prescriptions  Pending Prescriptions Disp Refills   sertraline (ZOLOFT) 50 MG tablet [Pharmacy Med Name: SERTRALINE HCL 50 MG TABLET] 90 tablet 0    Sig: TAKE 1 TABLET BY MOUTH DAILY     Psychiatry:  Antidepressants - SSRI - sertraline Failed - 05/28/2023  2:21 PM      Failed - Valid encounter within last 6 months    Recent Outpatient Visits           1 year ago History of CVA (cerebrovascular accident)   [facility] Donita Brooks, MD   2 years ago Bacterial sinusitis   [facility] Valentino Nose, NP   2 years ago Septic olecranon bursitis of left elbow   [facility] Tanya Nones, Priscille Heidelberg, MD   2 years ago Left elbow pain   [facility] Valentino Nose, NP   2 years ago Irregular heart beats   [facility] Pickard, Priscille Heidelberg, MD              Passed - AST in normal range and within 360 days    AST  Date Value Ref Range Status  03/01/2023 25 10 - 35 U/L Final         Passed - ALT in normal range and within 360 days    ALT  Date Value Ref Range Status  03/01/2023 24 9 - 46 U/L Final         Passed - Completed PHQ-2 or PHQ-9 in the last 360 days

## 2023-06-01 ENCOUNTER — Other Ambulatory Visit: Payer: Self-pay | Admitting: Family Medicine

## 2023-06-04 ENCOUNTER — Other Ambulatory Visit: Payer: Self-pay

## 2023-06-04 NOTE — Telephone Encounter (Signed)
 Prescription Request  06/04/2023  LOV: 05/08/23  What is the name of the medication or equipment? sertraline  (ZOLOFT ) 50 MG tablet [[id number]]   Have you contacted your pharmacy to request a refill? Yes   Which pharmacy would you like this sent to?  [facility] [id number] [name], [place] - [address] [name] [address] Phone: [phone] Fax: [phone]    Patient notified that their request is being sent to the clinical staff for review and that they should receive a response within 2 business days.   Please advise at Home [phone]  New med request: Prescription Request  06/04/2023  LOV: 05/08/23  What is the name of the medication or equipment? Trulicity 1.5 MG/0.5 ML Pen (Med not found on med list)  Have you contacted your pharmacy to request a refill? Yes   Which pharmacy would you like this sent to?  [facility] [id number] [name], [place] - [address] [name] [address] Phone: [phone] Fax: [phone]    Patient notified that their request is being sent to the clinical staff for review and that they should receive a response within 2 business days.   Please advise at [facility] [phone]

## 2023-06-05 ENCOUNTER — Other Ambulatory Visit: Payer: Medicare Other

## 2023-06-05 ENCOUNTER — Other Ambulatory Visit: Payer: Self-pay | Admitting: Family Medicine

## 2023-06-05 DIAGNOSIS — E876 Hypokalemia: Secondary | ICD-10-CM

## 2023-06-05 DIAGNOSIS — E039 Hypothyroidism, unspecified: Secondary | ICD-10-CM

## 2023-06-05 DIAGNOSIS — E785 Hyperlipidemia, unspecified: Secondary | ICD-10-CM

## 2023-06-05 DIAGNOSIS — E871 Hypo-osmolality and hyponatremia: Secondary | ICD-10-CM

## 2023-06-05 DIAGNOSIS — E118 Type 2 diabetes mellitus with unspecified complications: Secondary | ICD-10-CM

## 2023-06-05 DIAGNOSIS — I10 Essential (primary) hypertension: Secondary | ICD-10-CM

## 2023-06-05 MED ORDER — SERTRALINE HCL 50 MG PO TABS: 50.0000 mg | ORAL_TABLET | Freq: Every day | ORAL | 0 refills | Status: DC

## 2023-06-05 NOTE — Telephone Encounter (Signed)
LOV 03/01/2023.  All labs in date.  Requested Prescriptions  Pending Prescriptions Disp Refills   sertraline (ZOLOFT) 50 MG tablet 90 tablet 0    Sig: Take 1 tablet (50 mg total) by mouth daily.     Psychiatry:  Antidepressants - SSRI - sertraline Failed - 06/05/2023  1:32 PM      Failed - Valid encounter within last 6 months    Recent Outpatient Visits           1 year ago History of CVA (cerebrovascular accident)   [facility] Donita Brooks, MD   2 years ago Bacterial sinusitis   [facility] Valentino Nose, NP   2 years ago Septic olecranon bursitis of left elbow   [facility] Tanya Nones, Priscille Heidelberg, MD   2 years ago Left elbow pain   [facility] Valentino Nose, NP   2 years ago Irregular heart beats   [facility] Pickard, Priscille Heidelberg, MD              Passed - AST in normal range and within 360 days    AST  Date Value Ref Range Status  03/01/2023 25 10 - 35 U/L Final         Passed - ALT in normal range and within 360 days    ALT  Date Value Ref Range Status  03/01/2023 24 9 - 46 U/L Final         Passed - Completed PHQ-2 or PHQ-9 in the last 360 days

## 2023-06-05 NOTE — Telephone Encounter (Signed)
Pharmacy sent eFax to request refill of sertraline (ZOLOFT) 50 MG tablet   Pharmacy:  [facility] [id number] [place], [place] - [address] [place] [address] Phone: [phone]  Fax: [phone]   Please advise pharmacist.

## 2023-06-06 LAB — CBC WITH DIFFERENTIAL/PLATELET
Absolute Lymphocytes: 2059 {cells}/uL (ref 850–3900)
Absolute Monocytes: 792 {cells}/uL (ref 200–950)
Basophils Absolute: 53 {cells}/uL (ref 0–200)
Basophils Relative: 0.8 %
Eosinophils Absolute: 139 {cells}/uL (ref 15–500)
Eosinophils Relative: 2.1 %
HCT: 48.3 % (ref 38.5–50.0)
Hemoglobin: 16.2 g/dL (ref 13.2–17.1)
MCH: 29.4 pg (ref 27.0–33.0)
MCHC: 33.5 g/dL (ref 32.0–36.0)
MCV: 87.7 fL (ref 80.0–100.0)
MPV: 9.9 fL (ref 7.5–12.5)
Monocytes Relative: 12 %
Neutro Abs: 3557 {cells}/uL (ref 1500–7800)
Neutrophils Relative %: 53.9 %
Platelets: 219 10*3/uL (ref 140–400)
RBC: 5.51 10*6/uL (ref 4.20–5.80)
RDW: 12.9 % (ref 11.0–15.0)
Total Lymphocyte: 31.2 %
WBC: 6.6 10*3/uL (ref 3.8–10.8)

## 2023-06-06 LAB — COMPLETE METABOLIC PANEL WITH GFR
AG Ratio: 1.7 (calc) (ref 1.0–2.5)
ALT: 23 U/L (ref 9–46)
AST: 23 U/L (ref 10–35)
Albumin: 4.4 g/dL (ref 3.6–5.1)
Alkaline phosphatase (APISO): 74 U/L (ref 35–144)
BUN/Creatinine Ratio: 27 (calc) — ABNORMAL HIGH (ref 6–22)
BUN: 34 mg/dL — ABNORMAL HIGH (ref 7–25)
CO2: 25 mmol/L (ref 20–32)
Calcium: 9.5 mg/dL (ref 8.6–10.3)
Chloride: 100 mmol/L (ref 98–110)
Creat: 1.28 mg/dL (ref 0.70–1.35)
Globulin: 2.6 g/dL (ref 1.9–3.7)
Glucose, Bld: 92 mg/dL (ref 65–99)
Potassium: 4.9 mmol/L (ref 3.5–5.3)
Sodium: 137 mmol/L (ref 135–146)
Total Bilirubin: 0.5 mg/dL (ref 0.2–1.2)
Total Protein: 7 g/dL (ref 6.1–8.1)
eGFR: 61 mL/min/{1.73_m2} (ref >=60)

## 2023-06-06 LAB — HEMOGLOBIN A1C
Hgb A1c MFr Bld: 7.7 %{Hb} — ABNORMAL HIGH (ref <5.7)
Mean Plasma Glucose: 174 mg/dL
eAG (mmol/L): 9.7 mmol/L

## 2023-06-06 LAB — LIPID PANEL
Cholesterol: 158 mg/dL (ref <200)
HDL: 54 mg/dL (ref >=40)
LDL Cholesterol (Calc): 75 mg/dL
Non-HDL Cholesterol (Calc): 104 mg/dL (ref <130)
Total CHOL/HDL Ratio: 2.9 (calc) (ref <5.0)
Triglycerides: 194 mg/dL — ABNORMAL HIGH (ref <150)

## 2023-06-06 LAB — TSH: TSH: 3.4 m[IU]/L (ref 0.40–4.50)

## 2023-06-07 ENCOUNTER — Other Ambulatory Visit (HOSPITAL_COMMUNITY): Payer: Self-pay | Admitting: *Deleted

## 2023-06-08 ENCOUNTER — Ambulatory Visit (HOSPITAL_COMMUNITY)
Admission: RE | Admit: 2023-06-08 | Discharge: 2023-06-08 | Disposition: A | Discharge status: Home / Self Care | Payer: Medicare Other | Source: Ambulatory Visit | Attending: Cardiology | Admitting: Cardiology

## 2023-06-08 DIAGNOSIS — R760 Raised antibody titer: Secondary | ICD-10-CM | POA: Insufficient documentation

## 2023-06-08 MED ORDER — INCLISIRAN SODIUM 284 MG/1.5ML ~~LOC~~ SOSY
284.0000 mg | PREFILLED_SYRINGE | Freq: Once | SUBCUTANEOUS | Status: AC
  Administered 2023-06-08: 284 mg via SUBCUTANEOUS

## 2023-06-08 MED ORDER — INCLISIRAN SODIUM 284 MG/1.5ML ~~LOC~~ SOSY
PREFILLED_SYRINGE | SUBCUTANEOUS | Status: AC
  Filled 2023-06-08: qty 1.5

## 2023-06-18 ENCOUNTER — Other Ambulatory Visit: Payer: Self-pay | Admitting: Family Medicine

## 2023-06-26 ENCOUNTER — Ambulatory Visit: Payer: Medicare Other | Attending: Cardiovascular Disease | Admitting: *Deleted

## 2023-06-26 ENCOUNTER — Other Ambulatory Visit: Payer: Self-pay | Admitting: Cardiology

## 2023-06-26 DIAGNOSIS — D6859 Other primary thrombophilia: Secondary | ICD-10-CM | POA: Insufficient documentation

## 2023-06-26 DIAGNOSIS — I823 Embolism and thrombosis of renal vein: Secondary | ICD-10-CM | POA: Insufficient documentation

## 2023-06-26 DIAGNOSIS — R760 Raised antibody titer: Secondary | ICD-10-CM | POA: Diagnosis present

## 2023-06-26 DIAGNOSIS — I639 Cerebral infarction, unspecified: Secondary | ICD-10-CM | POA: Insufficient documentation

## 2023-06-26 DIAGNOSIS — Z7901 Long term (current) use of anticoagulants: Secondary | ICD-10-CM | POA: Diagnosis not present

## 2023-06-26 DIAGNOSIS — D68312 Antiphospholipid antibody with hemorrhagic disorder: Secondary | ICD-10-CM | POA: Diagnosis not present

## 2023-06-26 LAB — POCT INR: INR: 1.8 — AB (ref 2.0–3.0)

## 2023-06-26 NOTE — Patient Instructions (Addendum)
 Description   Today take 1.5 tablets of warfarin then continue taking Warfarin 1 tablet daily except 1.5 tablets on Mondays and Fridays.  Stay consistent with greens each week.  Recheck INR in 4 weeks.  Coumadin Clinic [phone] Have GI office fax over cardiac clearance request

## 2023-07-10 ENCOUNTER — Other Ambulatory Visit: Payer: Self-pay | Admitting: Cardiology

## 2023-07-16 ENCOUNTER — Other Ambulatory Visit: Payer: Self-pay | Admitting: Family Medicine

## 2023-07-16 DIAGNOSIS — E118 Type 2 diabetes mellitus with unspecified complications: Secondary | ICD-10-CM

## 2023-07-17 ENCOUNTER — Ambulatory Visit (INDEPENDENT_AMBULATORY_CARE_PROVIDER_SITE_OTHER): Payer: Medicare Other | Admitting: Otolaryngology

## 2023-07-17 ENCOUNTER — Encounter (INDEPENDENT_AMBULATORY_CARE_PROVIDER_SITE_OTHER): Payer: Self-pay

## 2023-07-17 VITALS — BP 110/74 | HR 71 | Ht 68.0 in | Wt 180.0 lb

## 2023-07-17 DIAGNOSIS — J310 Chronic rhinitis: Secondary | ICD-10-CM

## 2023-07-17 DIAGNOSIS — J320 Chronic maxillary sinusitis: Secondary | ICD-10-CM

## 2023-07-17 DIAGNOSIS — R0981 Nasal congestion: Secondary | ICD-10-CM | POA: Diagnosis not present

## 2023-07-17 DIAGNOSIS — J322 Chronic ethmoidal sinusitis: Secondary | ICD-10-CM

## 2023-07-17 NOTE — Telephone Encounter (Signed)
 Requested Prescriptions  Pending Prescriptions Disp Refills   metFORMIN (GLUCOPHAGE-XR) 500 MG 24 hr tablet [Pharmacy Med Name: METFORMIN HCL ER 500 MG TABLET] 360 tablet 0    Sig: TAKE 2 TABLETS BY MOUTH TWICE A DAY WITH A MEAL     Endocrinology:  Diabetes - Biguanides Failed - 07/17/2023 11:15 AM      Failed - B12 Level in normal range and within 720 days    No results found for: "VITAMINB12"       Failed - Valid encounter within last 6 months    Recent Outpatient Visits           1 year ago History of CVA (cerebrovascular accident)   [facility] Donita Brooks, MD   2 years ago Bacterial sinusitis   [facility] Valentino Nose, NP   2 years ago Septic olecranon bursitis of left elbow   [facility] Tanya Nones, Priscille Heidelberg, MD   2 years ago Left elbow pain   [facility] Valentino Nose, NP   2 years ago Irregular heart beats   [facility] Pickard, Priscille Heidelberg, MD              Passed - Cr in normal range and within 360 days    Creat  Date Value Ref Range Status  06/05/2023 1.28 0.70 - 1.35 mg/dL Final   Creatinine, Urine  Date Value Ref Range Status  03/01/2023 36 20 - 320 mg/dL Final         Passed - HBA1C is between 0 and 7.9 and within 180 days    Hgb A1c MFr Bld  Date Value Ref Range Status  06/05/2023 7.7 (H) <5.7 % of total Hgb Final    Comment:    For someone without known diabetes, a hemoglobin A1c value of 6.5% or greater indicates that they may have  diabetes and this should be confirmed with a follow-up  test. . For someone with known diabetes, a value <7% indicates  that their diabetes is well controlled and a value  greater than or equal to 7% indicates suboptimal  control. A1c targets should be individualized based on  duration of diabetes, age, comorbid conditions, and  other considerations. . Currently, no consensus exists regarding use of hemoglobin A1c for  diagnosis of diabetes for children. .          Passed - eGFR in normal range and within 360 days    GFR, Est African American  Date Value Ref Range Status  04/30/2017 89 > OR = 60 mL/min/1.1m2 Final   GFR calc Af Amer  Date Value Ref Range Status  03/08/2020 88 >59 mL/min/1.73 Final    Comment:    **In accordance with recommendations from the NKF-ASN Task force,**   [facility] is in the process of updating its eGFR calculation to the   2021 CKD-EPI creatinine equation that estimates kidney function   without a race variable.    GFR, Est Non African American  Date Value Ref Range Status  04/30/2017 77 > OR = 60 mL/min/1.36m2 Final   GFR, Estimated  Date Value Ref Range Status  07/31/2022 >60 >60 mL/min Final    Comment:    (NOTE) Calculated using the CKD-EPI Creatinine Equation (2021)    GFR  Date Value Ref Range Status  09/03/2014 89.45 >60.00 mL/min Final   eGFR  Date Value Ref Range Status  06/05/2023 61 > OR = 60  mL/min/1.7m2 Final         Passed - CBC within normal limits and completed in the last 12 months    WBC  Date Value Ref Range Status  06/05/2023 6.6 3.8 - 10.8 Thousand/uL Final   RBC  Date Value Ref Range Status  06/05/2023 5.51 4.20 - 5.80 Million/uL Final   Hemoglobin  Date Value Ref Range Status  06/05/2023 16.2 13.2 - 17.1 g/dL Final   HGB  Date Value Ref Range Status  12/13/2005 13.7 13.0 - 17.1 g/dL Final   HCT  Date Value Ref Range Status  06/05/2023 48.3 38.5 - 50.0 % Final  12/13/2005 40.1 38.7 - 49.9 % Final   MCHC  Date Value Ref Range Status  06/05/2023 33.5 32.0 - 36.0 g/dL Final    Comment:    For adults, a slight decrease in the calculated MCHC value (in the range of 30 to 32 g/dL) is most likely not clinically significant; however, it should be interpreted with caution in correlation with other red cell parameters and the patient's clinical condition.    [facility]  Date Value Ref Range Status  06/05/2023 29.4 27.0 -  33.0 pg Final   MCV  Date Value Ref Range Status  06/05/2023 87.7 80.0 - 100.0 fL Final  10/22/2012 93.7 80 - 97 fL Final  12/13/2005 88 82 - 98 fL Final   Platelet Count, POC  Date Value Ref Range Status  10/22/2012 193 142 - 424 K/uL Final   RDW  Date Value Ref Range Status  06/05/2023 12.9 11.0 - 15.0 % Final  12/13/2005 12.2 10.5 - 14.6 % Final   RDW, POC  Date Value Ref Range Status  10/22/2012 13.9 % Final

## 2023-07-17 NOTE — Progress Notes (Unsigned)
 Patient ID: Kenneth Cline, male   DOB: 1956/01/17, 68 y.o.   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  The patient has a history of chronic nasal obstruction and chronic maxillary/ethmoid sinusitis and polyposis.  He underwent bilateral endoscopic sinus surgery, septoplasty, and bilateral turbinate reduction in April 2024.  The patient returns today reporting significant improvement in his nasal breathing.  At his previous visits, he was noted to have recurrent crusting formation in his sinonasal cavities.  He was treated with medicated (mupirocin/budesonide/amphotericin B) nasal irrigation.  The patient reports significant improvement in his nasal symptoms after he was started on the medicated nasal rinse.  He has noted only mild nasal mucosal congestion during the allergy season.  Currently he denies any facial pain, fever, or visual change.  Exam: General: Communicates without difficulty, well nourished, no acute distress. Head: Normocephalic, no evidence injury, no tenderness, facial buttresses intact without stepoff. Face/sinus: No tenderness to palpation and percussion. Facial movement is normal and symmetric. Eyes: PERRL, EOMI. No scleral icterus, conjunctivae clear. Neuro: CN II exam reveals vision grossly intact.  No nystagmus at any point of gaze. Ears: Auricles well formed without lesions.  Ear canals are intact without mass or lesion.  No erythema or edema is appreciated.  The TMs are intact without fluid. Nose: External evaluation reveals normal support and skin without lesions.  Dorsum is intact.  Anterior rhinoscopy reveals mildly congested mucosa over anterior aspect of inferior turbinates and intact septum.  No purulence noted. Oral:  Oral cavity and oropharynx are intact, symmetric, without erythema or edema.  Mucosa is moist without lesions. Neck: Full range of  motion without pain.  There is no significant lymphadenopathy.  No masses palpable.  Thyroid bed within normal limits to palpation.  Parotid glands and submandibular glands equal bilaterally without mass.  Trachea is midline. Neuro:  CN 2-12 grossly intact.   Assessment: 1.  Chronic rhinitis with mild nasal mucosal congestion. 2.  History of bilateral chronic ethmoid and maxillary sinusitis and polyposis.  No acute infection is noted today. 3.  His septum and turbinates are well-healed.  Plan: 1.  The physical exam findings are reviewed with the patient. 2.  Medicated nasal rinse 2 times per week to prevent future infection. 3.  The patient will return for reevaluation in 4 months.

## 2023-07-23 ENCOUNTER — Telehealth (INDEPENDENT_AMBULATORY_CARE_PROVIDER_SITE_OTHER): Payer: Self-pay | Admitting: Otolaryngology

## 2023-07-23 NOTE — Telephone Encounter (Signed)
 Patient called  and requested  Atrovent 0.06 % nasal spray to be called in to [facility] on [address]. Per Dr. Suszanne Conners I called the nasal spray in with 5 refills.

## 2023-07-24 ENCOUNTER — Other Ambulatory Visit: Payer: Self-pay | Admitting: Cardiology

## 2023-07-24 ENCOUNTER — Ambulatory Visit

## 2023-07-24 DIAGNOSIS — I823 Embolism and thrombosis of renal vein: Secondary | ICD-10-CM

## 2023-07-24 NOTE — Telephone Encounter (Signed)
 Warfarin 5mg  refill Renal vein thrombosis, Antiphospholipid antibody positive  Last INR 06/26/23 Last OV 07/12/22

## 2023-07-25 ENCOUNTER — Ambulatory Visit: Attending: Cardiology | Admitting: *Deleted

## 2023-07-25 DIAGNOSIS — I823 Embolism and thrombosis of renal vein: Secondary | ICD-10-CM | POA: Diagnosis present

## 2023-07-25 DIAGNOSIS — Z7901 Long term (current) use of anticoagulants: Secondary | ICD-10-CM | POA: Diagnosis present

## 2023-07-25 DIAGNOSIS — R760 Raised antibody titer: Secondary | ICD-10-CM | POA: Insufficient documentation

## 2023-07-25 DIAGNOSIS — D68312 Antiphospholipid antibody with hemorrhagic disorder: Secondary | ICD-10-CM | POA: Diagnosis present

## 2023-07-25 DIAGNOSIS — I639 Cerebral infarction, unspecified: Secondary | ICD-10-CM | POA: Diagnosis present

## 2023-07-25 DIAGNOSIS — D6859 Other primary thrombophilia: Secondary | ICD-10-CM | POA: Insufficient documentation

## 2023-07-25 LAB — POCT INR: INR: 2 (ref 2.0–3.0)

## 2023-07-25 NOTE — Patient Instructions (Signed)
 Description   Today take 1.5 tablets of warfarin then continue taking Warfarin 1 tablet daily except 1.5 tablets on Mondays and Fridays.  Stay consistent with greens each week.  Recheck INR in 5 weeks.  Coumadin Clinic [phone] Have GI office fax over cardiac clearance request

## 2023-07-27 ENCOUNTER — Encounter: Payer: Self-pay | Admitting: Family Medicine

## 2023-07-27 ENCOUNTER — Ambulatory Visit: Admitting: Family Medicine

## 2023-07-27 VITALS — BP 118/62 | HR 68 | Temp 98.3°F | Ht 68.0 in | Wt 167.6 lb

## 2023-07-27 DIAGNOSIS — B9689 Other specified bacterial agents as the cause of diseases classified elsewhere: Secondary | ICD-10-CM | POA: Diagnosis not present

## 2023-07-27 DIAGNOSIS — J019 Acute sinusitis, unspecified: Secondary | ICD-10-CM

## 2023-07-27 MED ORDER — LEVOFLOXACIN 500 MG PO TABS: 500.0000 mg | ORAL_TABLET | Freq: Every day | ORAL | 0 refills | Status: AC

## 2023-07-27 NOTE — Progress Notes (Signed)
 Subjective:    Patient ID: Kenneth Cline, male    DOB: August 17, 1955, 68 y.o.   MRN: [id number]  He has a very complicated past medical history. He has a history of lupus anticoagulant. 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  He is on chronic fludrocortisone as well as prednisone for this. He has a history of type 2 diabetes as well as hypothyroidism which is treated by endocrinology. He also has a history of acute cholangitis requiring ERCP and bile duct stenting.  This is due to primary sclerosing cholangitis.  Patient is on chronic prednisone for this reason.    07/27/23 Patient has a history of sinus surgery due to recurrent chronic maxillary and frontal sinusitis.  3 weeks ago he started developing head congestion and rhinorrhea and postnasal drip.  Over the last week he has developed pain and pressure constantly in his frontal sinuses bilaterally.  He is tender to percussion over both eyebrows.  He is tender to percussion in both maxillary sinuses.  He reports a dull constant headache.  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   due to bilateral adrenal hemorrhage   Antiphospholipid antibody positive 2016   cardiolipin ab positive, B2-glyco positive, lupus anticoagulant positive   Antiphospholipid syndrome (HCC)    Autoimmune hepatitis (HCC)    PSC   Cellulitis 09/2018   septic shock, compartment syndrome s/p fasciotomy RLE at [facility]   CVA (cerebral infarction) 04/2015   3 small areas of infarct in both the cerebrum as well as the cerebellum -- , located with diplopia   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  One sister with valve disease.   GERD (gastroesophageal reflux disease)    H/O acute cholangitis 08/2014   Complicated by septic shock, acute pancreatitis and acute choledocholithiasis;    Lupus anticoagulant with hemorrhagic disorder (HCC)    Previously on Xarelto. Now on warfarin   Overweight (BMI 25.0-29.9)    PSC (primary sclerosing cholangitis)    managed by Dr. 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 Likely cholangitis   ERCP W/ PLASTIC STENT PLACEMENT  Sep 09 2014   [facility] - Dr. Benedetto Goad   ERCP  W/ SPHINCTEROTOMY AND BALLOON DILATION  11/10/2014   [facility] -- Removal of biliary stent,, to PACU duodenitis   ETHMOIDECTOMY Bilateral 08/04/2022   Procedure: BILATERAL ENDOSCOPIC ETHMOIDECTOMY;  Surgeon: Newman Pies, MD;  Location: [facility] OR;  Service: ENT;  Laterality: Bilateral;   LAPAROSCOPIC CHOLECYSTECTOMY W/ CHOLANGIOGRAPHY  November 13 2014   [facility], Dr. Sylvie Farrier [place]   MAXILLARY ANTROSTOMY Bilateral 08/04/2022   Procedure: BILATERAL ENDOSCOPIC MAXILLARY ANTROSTOMY WITH  TISSUE REMOVAL;  Surgeon: Newman Pies, MD;  Location: [facility] OR;  Service: ENT;  Laterality: Bilateral;   NASAL SEPTOPLASTY W/ TURBINOPLASTY Bilateral 08/04/2022   Procedure: NASAL SEPTOPLASTY WITH BILATERAL TURBINATE REDUCTION;  Surgeon: Newman Pies, MD;  Location: [facility] OR;  Service: ENT;  Laterality: Bilateral;   NM MYOCAR PERF WALL MOTION  06/28/2006   EF 70% , LV systolic fx norm.   ROTATOR CUFF REPAIR     TRANSTHORACIC ECHOCARDIOGRAM  04/2015   No pericardial effusion. No shunt. Full study: Basal septal LVH. Pseudo-normal filling (GR 2 DD). EF 57%.   TRANSTHORACIC ECHOCARDIOGRAM  08/2015   [facility]): Normal LV size. Basal septal hypertrophy. EF 57%. Pseudo-normal filling. Mild focal aortic valve thickening. (Study was done to evaluate for possible endocarditis)   Current Outpatient Medications on File Prior to Visit  Medication Sig Dispense Refill   Calcium Carb-Cholecalciferol (CALCIUM + VITAMIN D3 PO) Take 1 tablet by mouth daily.     clonazePAM (KLONOPIN) 0.5 MG tablet Take 0.5 mg by mouth 2 (two) times daily as needed for anxiety.     cyclobenzaprine (FLEXERIL) 10 MG tablet Take 1 tablet (10 mg total) by mouth 3 (three) times daily as needed for muscle spasms. 30 tablet 0   empagliflozin (JARDIANCE) 25 MG TABS tablet Take 1 tablet (25 mg total) by mouth daily before breakfast. 90 tablet 3   ezetimibe (ZETIA) 10 MG tablet TAKE 1 TABLET BY MOUTH DAILY 30 tablet 0   ferrous sulfate 325 (65 FE) MG tablet Take 325 mg by mouth daily with breakfast.     fludrocortisone (FLORINEF) 0.1 MG tablet Take 0.1 mg by mouth daily.     gabapentin (NEURONTIN) 300 MG capsule TAKE 1 CAPSULE BY MOUTH 3 TIMES A DAY MAY INCREASE TO UP TO 6 CAPSULES A DAY 180 capsule 0   guaiFENesin (MUCINEX) 600 MG 12 hr tablet Take 600-1,200 mg by mouth 2 (two) times daily as needed for to loosen phlegm or cough.     inclisiran (LEQVIO) 284 MG/1.5ML SOSY injection Inject 1.5 mLs (284 mg total) into the skin.     levothyroxine (SYNTHROID)  75 MCG tablet Take 1 tablet (75 mcg total) by mouth daily before breakfast. Alternate taking 75 mcg  ( whole tablet)  with 37.5 mcg ( 1/2 tablet) by mouth every other day 90 tablet 3   magnesium oxide (MAG-OX) 400 MG tablet Take 400 mg by mouth daily.     metFORMIN (GLUCOPHAGE-XR) 500 MG 24 hr tablet TAKE 2 TABLETS BY MOUTH TWICE A DAY WITH A MEAL 360 tablet 0   metoprolol succinate (TOPROL-XL) 50 MG 24 hr tablet TAKE 1 TABLET BY MOUTH DAILY WITH OR IMMEDIATELY FOLLOWING A MEAL 15 tablet 0   mometasone (NASONEX) 50 MCG/ACT nasal spray Place 2 sprays into the nose daily as needed (allergies).     oxyCODONE-acetaminophen (PERCOCET/ROXICET) 5-325 MG tablet Take 1 tablet by mouth every 6 (six) hours as needed for severe pain (pain score 7-10). 15 tablet 0   pantoprazole (PROTONIX) 40 MG tablet TAKE 1 TABLET  BY MOUTH 2 TIMES A DAY 180 tablet 0   predniSONE (DELTASONE) 1 MG tablet Take 8mg  PO Q AM and 2.5mg  PO Q PM (Patient taking differently: Take 2 mg by mouth See admin instructions. Take 7 mg PO Q AM and 2.5mg  PO Q PM, taking with 5 mg) 300 tablet 3   predniSONE (DELTASONE) 5 MG tablet Take 1 tablet (5 mg total) by mouth in the morning and at bedtime. (Patient taking differently: Take 2.5-5 mg by mouth See admin instructions. 5 mg with 2 mg every morning (7 mg), and 2.5 mg in the evening) 60 tablet 0   Probiotic Product (PROBIOTIC DAILY PO) Take 1 capsule by mouth daily.     sertraline (ZOLOFT) 50 MG tablet Take 1 tablet (50 mg total) by mouth daily. 90 tablet 0   ursodiol (ACTIGALL) 500 MG tablet Take 1 tablet (500 mg total) by mouth in the morning and at bedtime. 180 tablet 1   warfarin (COUMADIN) 5 MG tablet TAKE ONE TO ONE AND ONE-HALF (1-1.5) TABLETS BY MOUTH DAILY AS DIRECTED BY COUMADIN CLINIC 120 tablet 0   No current facility-administered medications on file prior to visit.   Allergies  Allergen Reactions   Biaxin [Clarithromycin] Rash    SJS   Keflex [Cephalexin] Hives, Itching and Rash    Macrolides And Ketolides Other (See Comments)    Unknown   Erythromycin     Was told not to take d/t medical problems   Penicillins Rash   Social History   Socioeconomic History   Marital status: Married    Spouse name: Not on file   Number of children: 2   Years of education: Not on file   Highest education level: Associate degree: occupational, Scientist, product/process development, or vocational program  Occupational History    Employer: [employer]  Tobacco Use   Smoking status: Former    Current packs/day: 0.50    Types: Cigarettes   Smokeless tobacco: Never  Vaping Use   Vaping status: Never Used  Substance and Sexual Activity   Alcohol use: Not Currently    Alcohol/week: 3.0 standard drinks of alcohol    Types: 3 Standard drinks or equivalent per week    Comment: occassional   Drug use: No   Sexual activity: Not Currently    Birth control/protection: Surgical, None  Other Topics Concern   Not on file  Social History Narrative   He is a married father of 2 with 3 stepchildren.  He has 3 grandchildren his own and 3 step grandchildren.  He works as a Curator for [employer].  He lives with his wife, [name], of 8 years.  He does not, and never did smoke.  He takes occasional alcohol beverage but nothing significant.  He does not do routine exercise, but does walk a lot at work.   Social Drivers of Corporate investment banker Strain: Low Risk  (04/29/2023)   Overall Financial Resource Strain (CARDIA)    Difficulty of Paying Living Expenses: Not hard at all  Food Insecurity: No Food Insecurity (04/29/2023)   Hunger Vital Sign    Worried About Running Out of Food in the Last Year: Never true    Ran Out of Food in the Last Year: Never true  Transportation Needs: No Transportation Needs (04/29/2023)   PRAPARE - Administrator, Civil Service (Medical): No    Lack of Transportation (Non-Medical): No  Physical Activity: Inactive (04/29/2023)   Exercise Vital Sign    Days of Exercise  per Week: 0 days     Minutes of Exercise per Session: 0 min  Stress: No Stress Concern Present (04/29/2023)   [employer] of Occupational Health - Occupational Stress Questionnaire    Feeling of Stress : Only a little  Social Connections: Moderately Integrated (04/29/2023)   Social Connection and Isolation Panel [NHANES]    Frequency of Communication with Friends and Family: More than three times a week    Frequency of Social Gatherings with Friends and Family: Once a week    Attends Religious Services: 1 to 4 times per year    Active Member of [employer] or Organizations: No    Attends Banker Meetings: Never    Marital Status: Married  Catering manager Violence: Not At Risk (02/22/2023)   Humiliation, Afraid, Rape, and Kick questionnaire    Fear of Current or Ex-Partner: No    Emotionally Abused: No    Physically Abused: No    Sexually Abused: No     Review of Systems     Objective:   Physical Exam Constitutional:      General: He is not in acute distress.    Appearance: He is not diaphoretic.  HENT:     Head: Normocephalic and atraumatic.     Right Ear: Tympanic membrane and ear canal normal.     Left Ear: Tympanic membrane and ear canal normal.     Nose: Congestion and rhinorrhea present.     Right Sinus: No maxillary sinus tenderness or frontal sinus tenderness.     Left Sinus: No maxillary sinus tenderness or frontal sinus tenderness.     Mouth/Throat:     Pharynx: No oropharyngeal exudate or posterior oropharyngeal erythema.  Neck:     Thyroid: No thyromegaly.     Vascular: No JVD.     Trachea: No tracheal deviation.  Cardiovascular:     Rate and Rhythm: Regular rhythm.     Heart sounds: No murmur heard.    No friction rub. No gallop.  Pulmonary:     Effort: No respiratory distress.     Breath sounds: No stridor. No wheezing, rhonchi or rales.  Chest:     Chest wall: No tenderness.  Abdominal:     General: Abdomen is flat. Bowel sounds are normal. There is no  distension.     Tenderness: There is no abdominal tenderness.  Musculoskeletal:     Cervical back: Normal range of motion and neck supple.  Lymphadenopathy:     Cervical: No cervical adenopathy.  Skin:    General: Skin is warm.     Coloration: Skin is not pale.     Findings: No erythema or rash.  Neurological:     Mental Status: He is alert and oriented to person, place, and time.     Cranial Nerves: No cranial nerve deficit.     Motor: No abnormal muscle tone.     Coordination: Coordination normal.     Deep Tendon Reflexes: Reflexes are normal and symmetric. Reflexes normal.        Assessment & Plan:  Acute bacterial rhinosinusitis Use Levaquin 500 mg a day for 7 days due to the patient's allergy profile.  Decrease Coumadin to 1 pill a day for the next 7 days then resume his previous standard dose due to the fact Levaquin will potentiate the effect of Coumadin and raise his INR

## 2023-08-04 ENCOUNTER — Other Ambulatory Visit: Payer: Self-pay | Admitting: Cardiology

## 2023-08-19 ENCOUNTER — Other Ambulatory Visit: Payer: Self-pay | Admitting: Cardiology

## 2023-08-21 ENCOUNTER — Other Ambulatory Visit: Payer: Self-pay | Admitting: Family Medicine

## 2023-08-21 NOTE — Telephone Encounter (Signed)
 Requesting refill:  Last OV: 07/27/23 Next OV: 02/28/24  Please approve or deny Zoloft . Thank you

## 2023-08-29 ENCOUNTER — Ambulatory Visit: Attending: Cardiology | Admitting: *Deleted

## 2023-08-29 DIAGNOSIS — I639 Cerebral infarction, unspecified: Secondary | ICD-10-CM | POA: Diagnosis present

## 2023-08-29 DIAGNOSIS — Z7901 Long term (current) use of anticoagulants: Secondary | ICD-10-CM | POA: Insufficient documentation

## 2023-08-29 DIAGNOSIS — I823 Embolism and thrombosis of renal vein: Secondary | ICD-10-CM | POA: Insufficient documentation

## 2023-08-29 DIAGNOSIS — D68312 Antiphospholipid antibody with hemorrhagic disorder: Secondary | ICD-10-CM | POA: Diagnosis present

## 2023-08-29 DIAGNOSIS — D6859 Other primary thrombophilia: Secondary | ICD-10-CM | POA: Insufficient documentation

## 2023-08-29 DIAGNOSIS — R760 Raised antibody titer: Secondary | ICD-10-CM | POA: Diagnosis present

## 2023-08-29 LAB — POCT INR: INR: 2.3 (ref 2.0–3.0)

## 2023-08-29 NOTE — Patient Instructions (Signed)
 Description   Continue taking Warfarin 1 tablet daily except 1.5 tablets on Mondays and Fridays.  Stay consistent with greens each week.  Recheck INR in 6 weeks.  Coumadin  Clinic [phone] Have GI office fax over cardiac clearance request

## 2023-09-05 ENCOUNTER — Encounter: Payer: Self-pay | Admitting: Family Medicine

## 2023-09-05 LAB — HM DIABETES EYE EXAM

## 2023-09-07 ENCOUNTER — Telehealth: Payer: Self-pay

## 2023-09-07 NOTE — Telephone Encounter (Signed)
   Pre-operative Risk Assessment    Patient Name: Kenneth Cline  DOB: [birth year] MRN: [id number]   Date of last office visit: 06/07/22 with Marcie Sever Date of next office visit: 09/28/23 with Randene Bustard    Request for Surgical Clearance    Procedure:  Colonoscopy  Date of Surgery:  Clearance 09/21/23                                Surgeon:  Not indicated  Surgeon's Group or Practice Name:  [facility] Phone number:  [phone] Attn: Eppie Hasting Fax number:  [phone] Att: Eppie Hasting   Type of Clearance Requested:   - Medical  - Pharmacy:  Hold Warfarin (Coumadin ) for 5 days prior. Lovenox  bridge is needed. "Lovenox  needs to be done at your office per patient."    Type of Anesthesia:  Not Indicated   Additional requests/questions:    Signed, Sudie Ely   09/07/2023, 1:54 PM

## 2023-09-07 NOTE — Telephone Encounter (Signed)
 I will update the requesting office procedure to be post poned per preop APP due to pt is past due for his yrly and has appt 09/28/23 with Dr. Addie Holstein. This will need to be kept before the pt can be cleared.

## 2023-09-10 NOTE — Telephone Encounter (Signed)
 Patient with diagnosis of Antiphospholipid antibody positive  on warfarin for anticoagulation.    Procedure: colonoscopy Date of procedure: 09/21/23  CrCl 59 ml/min Platelet count 219  Per office protocol, patient can hold warfarin for 5 days prior to procedure.    Patient WILL need bridging with Lovenox  (enoxaparin ) around procedure.  **This guidance is not considered finalized until pre-operative APP has relayed final recommendations.**  Will send to Coumadin  clinic. Pt will need his INR apt moved up

## 2023-09-10 NOTE — Telephone Encounter (Signed)
 I left message for surgery scheduler Brian Campanile that I faxed notes last week to see notes from preop APP.   I will fax notes again today. I did ask for Brian Campanile to call me back tomorrow to let me know she got my message and the notes and the procedure is not urgent.

## 2023-09-11 NOTE — Telephone Encounter (Signed)
 Eppie Hasting, RN from GI office called back and left vm to return her call. We spoke this morning about pt's upcoming procedure 09/21/23. I reviewed the notes from preop APP. Brian Campanile, did confirm she received the notes that I sent last night as FYI. Brian Campanile, did say his procedure is not urgent and can be post poned until the pt has been seen and cleared by Dr. Addie Holstein. I will update all parties involved pt has appt with Dr. Addie Holstein 09/28/23.

## 2023-09-12 NOTE — Telephone Encounter (Signed)
   Name: Kenneth Cline  DOB: 06/22/1955  MRN: [id number]  Primary Cardiologist: Randene Bustard, MD  Chart reviewed as part of pre-operative protocol coverage. The patient has an upcoming visit scheduled with Dr. Addie Holstein on 09/28/2023 at which time clearance can be addressed in case there are any issues that would impact surgical recommendations.  Colonoscopy is to be rescheduled as below. I added preop FYI to appointment note so that provider is aware to address at time of outpatient visit.  Per office protocol the cardiology provider should forward their finalized clearance decision and recommendations regarding antiplatelet therapy to the requesting party below.    Per office protocol, patient can hold warfarin for 5 days prior to procedure.     Patient WILL need bridging with Lovenox  (enoxaparin ) around procedure.  I will route this message as FYI to requesting party and remove this message from the preop box as separate preop APP input not needed at this time.   Please call with any questions.  Ava Boatman, NP  09/12/2023, 12:00 PM

## 2023-09-25 ENCOUNTER — Other Ambulatory Visit: Payer: Self-pay | Admitting: Cardiology

## 2023-09-25 ENCOUNTER — Other Ambulatory Visit: Payer: Self-pay | Admitting: Family Medicine

## 2023-09-25 DIAGNOSIS — I823 Embolism and thrombosis of renal vein: Secondary | ICD-10-CM

## 2023-09-27 ENCOUNTER — Ambulatory Visit: Payer: Self-pay

## 2023-09-27 NOTE — Telephone Encounter (Signed)
 Copied from CRM [id number]. Topic: Clinical - Red Word Triage >> Sep 27, 2023  4:44 PM Fredrica W wrote: Red Word that prompted transfer to Nurse Triage: Diahrrea for over a week - now passing blood   FYI Only or Action Required?: FYI only for provider  Patient was last seen in primary care on 07/27/2023 by Austine Lefort, MD. Called Nurse Triage reporting Diarrhea. Symptoms began a week ago. Interventions attempted: Dietary changes. Symptoms are: unchanged.  Triage Disposition: See PCP When Office is Open (Within 3 Days)  Patient/caregiver understands and will follow disposition?: Yes  Reason for Disposition  [1] Mild diarrhea (e.g., 1-3 or more stools than normal in past 24 hours) without known cause AND [2] present >  7 days  Answer Assessment - Initial Assessment Questions 1. DIARRHEA SEVERITY: "How bad is the diarrhea?" "How many more stools have you had in the past 24 hours than normal?"    - NO DIARRHEA (SCALE 0)   - MILD (SCALE 1-3): Few loose or mushy BMs; increase of 1-3 stools over normal daily number of stools; mild increase in ostomy output.   -  MODERATE (SCALE 4-7): Increase of 4-6 stools daily over normal; moderate increase in ostomy output.   -  SEVERE (SCALE 8-10; OR "WORST POSSIBLE"): Increase of 7 or more stools daily over normal; moderate increase in ostomy output; incontinence.     8 bowel movement total  2. ONSET: "When did the diarrhea begin?"      9 days ago  3. BM CONSISTENCY: "How loose or watery is the diarrhea?"      Loose and watery  4. VOMITING: "Are you also vomiting?" If Yes, ask: "How many times in the past 24 hours?"      No 5. ABDOMEN PAIN: "Are you having any abdomen pain?" If Yes, ask: "What does it feel like?" (e.g., crampy, dull, intermittent, constant)      No 6. ABDOMEN PAIN SEVERITY: If present, ask: "How bad is the pain?"  (e.g., Scale 1-10; mild, moderate, or severe)   - MILD (1-3): doesn't interfere with normal activities, abdomen soft and  not tender to touch    - MODERATE (4-7): interferes with normal activities or awakens from sleep, abdomen tender to touch    - SEVERE (8-10): excruciating pain, doubled over, unable to do any normal activities       0/10 7. ORAL INTAKE: If vomiting, "Have you been able to drink liquids?" "How much liquids have you had in the past 24 hours?"     No 8. HYDRATION: "Any signs of dehydration?" (e.g., dry mouth [not just dry lips], too weak to stand, dizziness, new weight loss) "When did you last urinate?"     No 9. EXPOSURE: "Have you traveled to a foreign country recently?" "Have you been exposed to anyone with diarrhea?" "Could you have eaten any food that was spoiled?"     No 10. ANTIBIOTIC USE: "Are you taking antibiotics now or have you taken antibiotics in the past 2 months?"       No 11. OTHER SYMPTOMS: "Do you have any other symptoms?" (e.g., fever, blood in stool)       Blood in stool, mild reddish tint to toilet and blood present while wiping, nausea  Protocols used: Diarrhea-A-AH

## 2023-09-28 ENCOUNTER — Encounter: Payer: Self-pay | Admitting: Cardiology

## 2023-09-28 ENCOUNTER — Ambulatory Visit: Attending: Cardiology | Admitting: Cardiology

## 2023-09-28 VITALS — BP 102/77 | HR 79 | Ht 68.0 in | Wt 162.4 lb

## 2023-09-28 DIAGNOSIS — K649 Unspecified hemorrhoids: Secondary | ICD-10-CM | POA: Insufficient documentation

## 2023-09-28 DIAGNOSIS — Z7901 Long term (current) use of anticoagulants: Secondary | ICD-10-CM | POA: Insufficient documentation

## 2023-09-28 DIAGNOSIS — K8301 Primary sclerosing cholangitis: Secondary | ICD-10-CM | POA: Insufficient documentation

## 2023-09-28 DIAGNOSIS — I10 Essential (primary) hypertension: Secondary | ICD-10-CM | POA: Insufficient documentation

## 2023-09-28 DIAGNOSIS — K921 Melena: Secondary | ICD-10-CM | POA: Insufficient documentation

## 2023-09-28 DIAGNOSIS — I823 Embolism and thrombosis of renal vein: Secondary | ICD-10-CM | POA: Diagnosis not present

## 2023-09-28 DIAGNOSIS — I639 Cerebral infarction, unspecified: Secondary | ICD-10-CM | POA: Diagnosis present

## 2023-09-28 DIAGNOSIS — E785 Hyperlipidemia, unspecified: Secondary | ICD-10-CM | POA: Diagnosis present

## 2023-09-28 DIAGNOSIS — R197 Diarrhea, unspecified: Secondary | ICD-10-CM | POA: Insufficient documentation

## 2023-09-28 DIAGNOSIS — D68312 Antiphospholipid antibody with hemorrhagic disorder: Secondary | ICD-10-CM | POA: Insufficient documentation

## 2023-09-28 NOTE — Patient Instructions (Signed)
 Medication Instructions:  Continue same medications *If you need a refill on your cardiac medications before your next appointment, please call your pharmacy*  Lab Work: INR and CBC today  Testing/Procedures: None ordered  Follow-Up: At [facility], you and your health needs are our priority.  As part of our continuing mission to provide you with exceptional heart care, our providers are all part of one team.  This team includes your primary Cardiologist (physician) and Advanced Practice Providers or APPs (Physician Assistants and Nurse Practitioners) who all work together to provide you with the care you need, when you need it.  Your next appointment:  1 year   Call in Feb to schedule June appointment    Provider:  Dr.Harding   We recommend signing up for the patient portal called "MyChart".  Sign up information is provided on this After Visit Summary.  MyChart is used to connect with patients for Virtual Visits (Telemedicine).  Patients are able to view lab/test results, encounter notes, upcoming appointments, etc.  Non-urgent messages can be sent to your provider as well.   To learn more about what you can do with MyChart, go to [email or web address].    Have GI call before having colonoscopy to schedule Lovenox  injections

## 2023-09-28 NOTE — Progress Notes (Signed)
 Cardiology Office Note:  .   Date:  09/30/2023  ID:  Kenneth Cline, DOB [birth year], MRN [id number] PCP: Austine Lefort, MD  [facility] Providers Cardiologist:  Randene Bustard, MD Cardiology APP:  Lamond Pilot, [place]     Chief Complaint  Patient presents with   Follow-up    No cardiac issues.  Just needs to continuous establishing care for warfarin management.    Patient Profile: .     Kenneth Cline is a  68 y.o. male  with a PMH notable for PSC (primary sclerosing cholangitis) HTN, HLD with family history of CAD, PACs PVCs, history of CVA and antiphospholipid antibody/lupus anticoagulant/antiphospholipid syndrome (history of renal vein thrombosis) who presents here for delayed annual follow-up.   He continues to return at the request of Austine Lefort, MD.      I last saw Jakota Manthei in person in August 2022 and then via virtual visit October 2022.  He was last seen on 06/07/2022 by Marcie Sever, NP for preop assessment for nasal septoplasty and bilateral turbinate reduction.  Noted that they would require bridging anticoagulation while holding the warfarin.  Was active and climbing stairs at thousand daily without any issues.  No angina or heart failure.  PACs and PVCs well-controlled on Toprol .  Indicated he could take additional dose if necessary.  Intolerant to statins, on Zetia  and referred to lipid clinic. He was seen in lipid clinic on 07/12/2022-been decided to pursue authorization for Leqvio .  We also discussed potentially adding GLP-1 inhibitor  Subjective  Discussed the use of AI scribe software for clinical note transcription with the patient, who gave verbal consent to proceed.  History of Present Illness  Kenneth Cline is a 68 year old male with primary sclerosing cholangitis and antiphospholipid syndrome who presents with diarrhea and recent bloody stool.  He has been experiencing severe diarrhea recently, with a notable episode of bloody  stool yesterday. No blood was present in the stool this morning. He has lost about 15 pounds over the last six months, which he attributes to the diarrhea. Despite enjoying eating, he acknowledges not always choosing the healthiest options.  He has a history of primary sclerosing cholangitis (PSC) but has not been diagnosed with inflammatory bowel disease. He has not experienced any recent pancreatitis or inflammation related to [facility].  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 He is on long-term anticoagulation therapy and manages his INR through a Coumadin  clinic, with the most recent INR being 2.3 in May. No recent strokes or leg swelling have been noted.  He is on Leqvio  injections every six months for lipid management, with the next dose scheduled for August 14th. He takes Toprol  50 mg for infrequent palpitations. No shortness of breath, dizziness, or chest pain.  He manages diabetes with Jardiance  and metformin , recently increasing the metformin  dosage to 1000 mg twice daily. However, he reduced the metformin  dose due to diarrhea, which seemed to help. His A1c was 7.7 in February.  Cardiovascular ROS: no chest pain or dyspnea on exertion negative for - edema, irregular heartbeat, orthopnea, palpitations, paroxysmal nocturnal dyspnea, rapid heart rate, shortness of breath, or syncope/near syncope or TIA/amaurosis fugax, claudication    Objective   Current Meds  Medication Sig   Calcium  Carb-Cholecalciferol (CALCIUM  + VITAMIN D3 PO) Take 1 tablet by mouth daily.   clonazePAM  (KLONOPIN ) 0.5 MG tablet Take 0.5 mg by mouth 2 (  two) times daily as needed for anxiety.   cyclobenzaprine  (FLEXERIL ) 10 MG tablet Take 1 tablet (10 mg total) by mouth 3 (three) times daily as needed for muscle spasms.   empagliflozin  (JARDIANCE ) 25 MG TABS tablet Take 1 tablet (25 mg total) by mouth daily before breakfast.   ezetimibe  (ZETIA ) 10 MG  tablet Take 1 tablet (10 mg total) by mouth daily.   ferrous sulfate 325 (65 FE) MG tablet Take 325 mg by mouth daily with breakfast.   fludrocortisone  (FLORINEF ) 0.1 MG tablet Take 0.1 mg by mouth daily.   gabapentin  (NEURONTIN ) 300 MG capsule TAKE 1 CAPSULE BY MOUTH 3 TIMES A DAY MAY INCREASE TO UP TO 6 CAPSULES A DAY   guaiFENesin  (MUCINEX ) 600 MG 12 hr tablet Take 600-1,200 mg by mouth 2 (two) times daily as needed for to loosen phlegm or cough.   inclisiran (LEQVIO ) 284 MG/1.5ML SOSY injection Inject 1.5 mLs (284 mg total) into the skin.   levothyroxine  (SYNTHROID ) 75 MCG tablet Take 1 tablet (75 mcg total) by mouth daily before breakfast. Alternate taking 75 mcg  ( whole tablet)  with 37.5 mcg ( 1/2 tablet) by mouth every other day   magnesium  oxide (MAG-OX) 400 MG tablet Take 400 mg by mouth daily.   metFORMIN  (GLUCOPHAGE -XR) 500 MG 24 hr tablet TAKE 2 TABLETS BY MOUTH TWICE A DAY WITH A MEAL   metoprolol  succinate (TOPROL -XL) 50 MG 24 hr tablet TAKE 1 TABLET BY MOUTH DAILY WITH OR IMMEDIATELY FOLLOWING A MEAL   mometasone (NASONEX) 50 MCG/ACT nasal spray Place 2 sprays into the nose daily as needed (allergies).   oxyCODONE -acetaminophen  (PERCOCET/ROXICET) 5-325 MG tablet Take 1 tablet by mouth every 6 (six) hours as needed for severe pain (pain score 7-10).   pantoprazole  (PROTONIX ) 40 MG tablet TAKE 1 TABLET BY MOUTH 2 TIMES A DAY   predniSONE  (DELTASONE ) 1 MG tablet Take 8mg  PO Q AM and 2.5mg  PO Q PM (Patient taking differently: Take 2 mg by mouth See admin instructions. Take 7 mg PO Q AM and 2.5mg  PO Q PM, taking with 5 mg)   predniSONE  (DELTASONE ) 5 MG tablet Take 1 tablet (5 mg total) by mouth in the morning and at bedtime. (Patient taking differently: Take 2.5-5 mg by mouth See admin instructions. 5 mg with 2 mg every morning (7 mg), and 2.5 mg in the evening)   Probiotic Product (PROBIOTIC DAILY PO) Take 1 capsule by mouth daily.   sertraline  (ZOLOFT ) 50 MG tablet TAKE 1 TABLET BY  MOUTH DAILY   ursodiol  (ACTIGALL ) 500 MG tablet TAKE 1 TABLET BY MOUTH EVERY MORNING AND AT BEDTIME   warfarin (COUMADIN ) 5 MG tablet TAKE 1 TO 1 AND 1/2 TABLETS BY MOUTH DAILY AS DIRECTED BY COUMADIN  CLINIC     Studies Reviewed: Aaron Aas   EKG Interpretation Date/Time:  Friday September 28 2023 08:39:49 EDT Ventricular Rate:  79 PR Interval:  124 QRS Duration:  96 QT Interval:  358 QTC Calculation: 410 R Axis:   10  Text Interpretation: Normal sinus rhythm Normal ECG When compared with ECG of 11-Aug-2014 00:39, PREVIOUS ECG IS PRESENT Confirmed by Randene Bustard ([id number]) on 09/28/2023 8:50:48 AM    No recent studies  Lab Results  Component Value Date   CHOL 158 06/05/2023   HDL 54 06/05/2023   LDLCALC 75 06/05/2023   TRIG 194 (H) 06/05/2023   CHOLHDL 2.9 06/05/2023   Lab Results  Component Value Date   NA 137 06/05/2023   K 4.9  06/05/2023   CREATININE 1.28 06/05/2023   EGFR 61 06/05/2023   GLUCOSE 92 06/05/2023     Risk Assessment/Calculations:         He is ON Long Term DOAC 2/2 Athi-Phospholipid Ab  Physical Exam:   VS:  BP 102/77 (BP Location: Left Arm, Patient Position: Sitting)   Pulse 79   Ht 5\' 8"  (1.727 m)   Wt 162 lb 6.4 oz (73.7 kg)   SpO2 97%   BMI 24.69 kg/m    Wt Readings from Last 3 Encounters:  09/28/23 162 lb 6.4 oz (73.7 kg)  07/27/23 167 lb 9.6 oz (76 kg)  07/17/23 180 lb (81.6 kg)    GEN: Well nourished, well groomed in no acute distress; notable weight loss from my last visit. NECK: No JVD; No carotid bruits CARDIAC: Normal S1, S2; RRR, no murmurs, rubs, gallops RESPIRATORY:  Clear to auscultation without rales, wheezing or rhonchi ; nonlabored, good air movement. ABDOMEN: Soft, non-tender, non-distended EXTREMITIES:  No edema; No deformity     ASSESSMENT AND PLAN: .    Problem List Items Addressed This Visit       Cardiology Problems   Bleeding hemorrhoids   Relevant Orders   Protime-INR (Completed)   CBC w/Diff (Completed)    Hypertension, essential, benign (Chronic)   Blood pressure slightly low, possibly due to dehydration from diarrhea. Managed with Toprol  50 mg daily - Ensure adequate hydration,  - Continue Toprol  at current dosage.      Relevant Orders   Protime-INR (Completed)   CBC w/Diff (Completed)   Renal vein thrombosis (HCC) (Chronic)   History of renal vein thrombosis with antiphospholipid antibody syndrome.  On long-term anticoagulation.      Relevant Orders   Protime-INR (Completed)   CBC w/Diff (Completed)     Other   Blood in stool   Recent bloody stool, differential includes hemorrhoids. Colonoscopy needed for further investigation. - Schedule colonoscopy. - Coordinate with Coumadin  clinic for anticoagulation management.      Cerebral infarction (HCC) - Primary   Relevant Orders   EKG 12-Lead (Completed)   Protime-INR (Completed)   CBC w/Diff (Completed)   Diarrhea   Severe diarrhea with weight loss, likely related to metformin . Bloody stool noted, possibly due to hemorrhoids. - Order CBC and INR. - Ensure adequate hydration. - Consider metformin  dose adjustment.      Dyslipidemia (Chronic)   Managed with Leqvio . Recent lipid panel shows LDL at 75. Zetia  discontinued. Not on statin because of underlying hepatic issues. - Continue Leqvio  injections as scheduled.      Relevant Orders   Protime-INR (Completed)   CBC w/Diff (Completed)   Long term (current) use of anticoagulants (Chronic)   On anticoagulation therapy with INR at 2.3. Monitoring needed due to recent bloody stool. - Recheck INR today. - Coordinate with Coumadin  clinic for anticoagulation management.  -> Given coagulation disorder, would need to bridge with Lovenox  if warfarin held for procedures per protocol 5 days preop. => Coordinate with Coumadin  clinic to initiate Lovenox  therapy.      Relevant Orders   Protime-INR (Completed)   CBC w/Diff (Completed)   Lupus anticoagulant with hemorrhagic disorder  (HCC)   On long-term warfarin therapy      PSC (primary sclerosing cholangitis)   Relevant Orders   Protime-INR (Completed)   CBC w/Diff (Completed)           Follow-Up: Return in about 1 year (around 09/27/2024) for Routine follow up with me, Ilene Malling  [address] [employer].     Signed, Arleen Lacer, MD, MS Randene Bustard, M.D., M.S. Interventional Chartered certified accountant  Pager # [phone]

## 2023-09-29 ENCOUNTER — Ambulatory Visit: Payer: Self-pay | Admitting: Cardiology

## 2023-09-29 LAB — CBC WITH DIFFERENTIAL/PLATELET
Basophils Absolute: 0.1 10*3/uL (ref 0.0–0.2)
Basos: 1 %
EOS (ABSOLUTE): 0.2 10*3/uL (ref 0.0–0.4)
Eos: 1 %
Hematocrit: 52.5 % — ABNORMAL HIGH (ref 37.5–51.0)
Hemoglobin: 17.3 g/dL (ref 13.0–17.7)
Immature Grans (Abs): 0.1 10*3/uL (ref 0.0–0.1)
Immature Granulocytes: 1 %
Lymphocytes Absolute: 1.9 10*3/uL (ref 0.7–3.1)
Lymphs: 15 %
MCH: 29.4 pg (ref 26.6–33.0)
MCHC: 33 g/dL (ref 31.5–35.7)
MCV: 89 fL (ref 79–97)
Monocytes Absolute: 1.1 10*3/uL — ABNORMAL HIGH (ref 0.1–0.9)
Monocytes: 8 %
Neutrophils Absolute: 9.5 10*3/uL — ABNORMAL HIGH (ref 1.4–7.0)
Neutrophils: 74 %
Platelets: 346 10*3/uL (ref 150–450)
RBC: 5.88 x10E6/uL — ABNORMAL HIGH (ref 4.14–5.80)
RDW: 14.1 % (ref 11.6–15.4)
WBC: 12.8 10*3/uL — ABNORMAL HIGH (ref 3.4–10.8)

## 2023-09-29 LAB — PROTIME-INR
INR: 3.4 — ABNORMAL HIGH (ref 0.9–1.2)
Prothrombin Time: 34.7 s — ABNORMAL HIGH (ref 9.1–12.0)

## 2023-09-30 ENCOUNTER — Encounter: Payer: Self-pay | Admitting: Cardiology

## 2023-09-30 DIAGNOSIS — K921 Melena: Secondary | ICD-10-CM | POA: Insufficient documentation

## 2023-09-30 NOTE — Assessment & Plan Note (Signed)
 Managed with Leqvio . Recent lipid panel shows LDL at 75. Zetia  discontinued. Not on statin because of underlying hepatic issues. - Continue Leqvio  injections as scheduled.

## 2023-09-30 NOTE — Assessment & Plan Note (Signed)
 History of renal vein thrombosis with antiphospholipid antibody syndrome.  On long-term anticoagulation.

## 2023-09-30 NOTE — Assessment & Plan Note (Addendum)
 On anticoagulation therapy with INR at 2.3. Monitoring needed due to recent bloody stool. - Recheck INR today. - Coordinate with Coumadin  clinic for anticoagulation management.  -> Given coagulation disorder, would need to bridge with Lovenox  if warfarin held for procedures per protocol 5 days preop. => Coordinate with Coumadin  clinic to initiate Lovenox  therapy.

## 2023-09-30 NOTE — Assessment & Plan Note (Signed)
 Severe diarrhea with weight loss, likely related to metformin . Bloody stool noted, possibly due to hemorrhoids. - Order CBC and INR. - Ensure adequate hydration. - Consider metformin  dose adjustment.

## 2023-09-30 NOTE — Assessment & Plan Note (Signed)
 On long-term warfarin therapy

## 2023-09-30 NOTE — Assessment & Plan Note (Signed)
 Blood pressure slightly low, possibly due to dehydration from diarrhea. Managed with Toprol  50 mg daily - Ensure adequate hydration,  - Continue Toprol  at current dosage.

## 2023-09-30 NOTE — Assessment & Plan Note (Signed)
 Recent bloody stool, differential includes hemorrhoids. Colonoscopy needed for further investigation. - Schedule colonoscopy. - Coordinate with Coumadin  clinic for anticoagulation management.

## 2023-10-01 ENCOUNTER — Ambulatory Visit
Admission: RE | Admit: 2023-10-01 | Discharge: 2023-10-01 | Disposition: A | Discharge status: Home / Self Care | Source: Ambulatory Visit | Attending: Family Medicine | Admitting: Family Medicine

## 2023-10-01 ENCOUNTER — Ambulatory Visit (INDEPENDENT_AMBULATORY_CARE_PROVIDER_SITE_OTHER): Admitting: Internal Medicine

## 2023-10-01 ENCOUNTER — Encounter: Payer: Self-pay | Admitting: Family Medicine

## 2023-10-01 ENCOUNTER — Ambulatory Visit (INDEPENDENT_AMBULATORY_CARE_PROVIDER_SITE_OTHER): Admitting: Family Medicine

## 2023-10-01 VITALS — BP 122/78 | HR 83 | Temp 98.6°F | Ht 68.0 in | Wt 162.2 lb

## 2023-10-01 DIAGNOSIS — I823 Embolism and thrombosis of renal vein: Secondary | ICD-10-CM

## 2023-10-01 DIAGNOSIS — R197 Diarrhea, unspecified: Secondary | ICD-10-CM | POA: Diagnosis not present

## 2023-10-01 DIAGNOSIS — K8301 Primary sclerosing cholangitis: Secondary | ICD-10-CM | POA: Diagnosis not present

## 2023-10-01 DIAGNOSIS — R110 Nausea: Secondary | ICD-10-CM | POA: Diagnosis not present

## 2023-10-01 DIAGNOSIS — D6859 Other primary thrombophilia: Secondary | ICD-10-CM

## 2023-10-01 DIAGNOSIS — D68312 Antiphospholipid antibody with hemorrhagic disorder: Secondary | ICD-10-CM

## 2023-10-01 DIAGNOSIS — R760 Raised antibody titer: Secondary | ICD-10-CM

## 2023-10-01 DIAGNOSIS — Z7901 Long term (current) use of anticoagulants: Secondary | ICD-10-CM

## 2023-10-01 MED ORDER — ONDANSETRON HCL 4 MG PO TABS: 4.0000 mg | ORAL_TABLET | Freq: Three times a day (TID) | ORAL | 0 refills | Status: AC | PRN

## 2023-10-01 MED ORDER — IOPAMIDOL (ISOVUE-370) INJECTION 76%
80.0000 mL | Freq: Once | INTRAVENOUS | Status: AC | PRN
  Administered 2023-10-01: 80 mL via INTRAVENOUS

## 2023-10-01 NOTE — Progress Notes (Signed)
 Patient Office Visit  Assessment & Plan:  Bloody diarrhea -     CBC with Differential/Platelet -     Comprehensive metabolic panel with GFR -     Lipase -     CT ABDOMEN PELVIS W CONTRAST; Future -     CT ABDOMEN PELVIS W CONTRAST; Future  Nausea -     Lipase -     Ondansetron  HCl; Take 1 tablet (4 mg total) by mouth every 8 (eight) hours as needed for nausea or vomiting.  Dispense: 30 tablet; Refill: 0 -     CT ABDOMEN PELVIS W CONTRAST; Future -     CT ABDOMEN PELVIS W CONTRAST; Future  PSC (primary sclerosing cholangitis) -     CT ABDOMEN PELVIS W CONTRAST; Future   Assessment and Plan    Chronic Diarrhea Diarrhea for two weeks with weight loss, nausea, and abdominal pain. Nocturnal incontinence noted. Differential includes diverticulitis, infection, or inflammatory bowel disease. Coumadin  complicates bleeding management. CT scan and blood work prioritized due to travel. - Order CT scan of abdomen to evaluate for diverticulitis or other causes. - Perform blood work to assess kidney function, lipase, and other markers. - Prescribe Zofran  for nausea, every 8 hours as needed. - Advise emergency care if symptoms worsen, especially before travel. - Ensure CT scan and blood work completed before Thursday. - Coordinate with referral services to expedite imaging and lab work.  Primary Sclerosing Cholangitis Chronic liver disease increasing gastrointestinal cancer risk, may contribute to symptoms. - Ensure diagnosis documented for insurance purposes.  Hemorrhoids Blood in stool likely due to hemorrhoids, exacerbated by blood thinners and possible constipation. - Monitor for significant changes in bleeding, especially with blood thinners.     Addendum- patient made aware- needs to contact GI specialist at [facility]  MPRESSION: CT scan  1. No acute inflammatory process identified within the abdomen or pelvis. 2. Since the prior study, there is marked atrophy versus  surgical resection of left hepatic lobe segments 2/3. Noncirrhotic liver configuration. Surgically absent gallbladder. 3. Multiple other nonacute observations, as described above.   Aortic Atherosclerosis (ICD10-I70.0).    No follow-ups on file.   Subjective:     Patient ID: Kenneth Cline, male    DOB: [birth year]  Age: 68 y.o. MRN: [id number]  Chief Complaint  Patient presents with   Diarrhea    Nausea and diarrhea x 2 weeks.     Diarrhea    Discussed the use of AI scribe software for clinical note transcription with the patient, who gave verbal consent to proceed.  History of Present Illness         Kenneth Cline is a 68 year old male with primary sclerosing cholangitis who presents with persistent bloody diarrhea and unintentional weight loss.  He has been experiencing diarrhea for the past two weeks, with bowel movements occurring four to six times daily. Initially watery, the diarrhea has now become loose. He notes the presence of blood in his stool, which he attributes to hemorrhoids. Over-the-counter Imodium  has provided minimal relief. patient's wife contacted [facility] but have not heard back yet. No fever or chills, no travel.   He reports a significant unintentional weight loss of approximately 20 pounds, with 15 pounds lost over the past six months and an additional 5 pounds over the recent weekend. He attributes this weight loss to the diarrhea and has been consuming a diet of chicken, rice, and protein shakes to manage his symptoms. No intentional dietary  changes aimed at weight loss. Per EPIC patient has not lost as much weight as he originally thought.   He experiences lower abdominal pain, described as a 5 out of 10 in severity, which is sometimes accompanied by nausea, particularly before eating. The pain is alleviated after bowel movements. No vomiting, but he reports feeling queasy. The symptoms, including diarrhea and nausea, disrupt his sleep, causing him  to wake up at night and occasionally soil the bed, necessitating the use of incontinence products. patient became concerned that his symptoms have not resoved.   He has a history of primary sclerosing cholangitis and is on blood thinners. He has not had a recent colonoscopy and is awaiting further instructions regarding scheduling, as he is on Coumadin . His last colonoscopy was three years ago, and he typically undergoes the procedure every two years due to his high risk of cancer. No family history of colon cancer.  He has a history of constipation and has been on metformin , which he has reduced due to decreased food intake. No recent travel or alcohol consumption and no fever or chills. He has been trying to maintain hydration despite the diarrhea. Physical Exam ABDOMEN: Abdomen non-tender on palpation. Results LABS WBC: elevated (09/28/2023) done at cardiology office.  Hb: elevated (09/28/2023) Hematocrit: within normal range (09/28/2023) Assessment & Plan Ongoing bloody diarrhea.  Diarrhea for two weeks with weight loss, nausea, and abdominal pain. Nocturnal fecal incontinence noted. Differential includes diverticulitis, infection, or inflammatory bowel disease. Coumadin  complicates bleeding management. CT scan ordered stat and blood work prioritized due to upcoming travel and ongoing symptoms.  - Order CT scan of abdomen to evaluate for diverticulitis or other causes. - Perform blood work to assess kidney function, lipase, and other markers. - Prescribe Zofran  for nausea, every 8 hours as needed. - Advise emergency care if symptoms worsen, especially before travel. - Ensure CT scan and blood work completed before Thursday. Consider stool studies if necessary  - Coordinate with referral services to expedite imaging and lab work.  Primary Sclerosing Cholangitis Chronic liver disease increasing gastrointestinal cancer risk, may contribute to symptoms. Patient has reached out to his GI  specialist at [facility]  - Ensure diagnosis documented for insurance purposes.  Hemorrhoids (history of this). Patient has UTD colonoscopy at [facility].  Blood in stool likely due to hemorrhoids, exacerbated by blood thinners and possible constipation. - Monitor for significant changes in bleeding, especially with blood thinners.    The ASCVD Risk score (Arnett DK, et al., 2019) failed to calculate for the following reasons:   Risk score cannot be calculated because patient has a medical history suggesting prior/existing ASCVD  Past Medical History:  Diagnosis Date   Adrenal hemorrhage (HCC) 07/2014   Following shoulder surgery   Adrenal insufficiency, primary, hemorrhagic (HCC) 07/2014   due to bilateral adrenal hemorrhage   Antiphospholipid antibody positive 2016   cardiolipin ab positive, B2-glyco positive, lupus anticoagulant positive   Antiphospholipid syndrome (HCC)    Autoimmune hepatitis (HCC)    PSC   Cellulitis 09/2018   septic shock, compartment syndrome s/p fasciotomy RLE at [facility]   CVA (cerebral infarction) 04/2015   3 small areas of infarct in both the cerebrum as well as the cerebellum -- , located with diplopia   Cyclic neutropenia (HCC)    Status post bone marrow transplant   Dyslipidemia    Erectile dysfunction    Essential hypertension    Family history of premature coronary artery disease    2 brothers,  one sister and father all with CAD.  One sister with valve disease.   GERD (gastroesophageal reflux disease)    H/O acute cholangitis 08/2014   Complicated by septic shock, acute pancreatitis and acute choledocholithiasis;    Lupus anticoagulant with hemorrhagic disorder (HCC)    Previously on Xarelto. Now on warfarin   Overweight (BMI 25.0-29.9)    PSC (primary sclerosing cholangitis)    managed by [facility] 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 Likely cholangitis   ERCP W/ PLASTIC STENT PLACEMENT  Sep 09 2014   [facility] - Dr. Madonna Schlein   ERCP W/ SPHINCTEROTOMY AND BALLOON DILATION  11/10/2014   [facility] -- Removal of biliary stent,, to PACU duodenitis   ETHMOIDECTOMY Bilateral 08/04/2022   Procedure: BILATERAL ENDOSCOPIC ETHMOIDECTOMY;  Surgeon: Reynold Caves, MD;  Location: [facility] OR;  Service: ENT;  Laterality: Bilateral;   LAPAROSCOPIC CHOLECYSTECTOMY W/ CHOLANGIOGRAPHY  November 13 2014   [facility], Dr. Angelica Kemp [place]   MAXILLARY ANTROSTOMY Bilateral 08/04/2022   Procedure: BILATERAL ENDOSCOPIC MAXILLARY ANTROSTOMY WITH TISSUE REMOVAL;  Surgeon: Reynold Caves, MD;  Location: [facility] OR;  Service: ENT;  Laterality: Bilateral;   NASAL SEPTOPLASTY W/ TURBINOPLASTY Bilateral 08/04/2022   Procedure: NASAL SEPTOPLASTY WITH BILATERAL TURBINATE REDUCTION;  Surgeon: Reynold Caves, MD;  Location: [facility] OR;  Service: ENT;  Laterality: Bilateral;   NM MYOCAR PERF WALL MOTION  06/28/2006   EF 70% , LV systolic fx norm.   ROTATOR CUFF REPAIR     TRANSTHORACIC ECHOCARDIOGRAM  04/2015   No pericardial effusion. No shunt. Full study: Basal septal LVH. Pseudo-normal filling (GR 2 DD). EF 57%.   TRANSTHORACIC ECHOCARDIOGRAM  08/2015   [facility]): Normal LV size. Basal septal hypertrophy. EF 57%.  Pseudo-normal filling. Mild focal aortic valve thickening. (Study was done to evaluate for possible endocarditis)   Social History   Tobacco Use   Smoking status: Former    Current packs/day: 0.50    Types: Cigarettes   Smokeless tobacco: Never  Vaping Use   Vaping status: Never Used  Substance Use Topics   Alcohol use: Not Currently    Alcohol/week: 3.0 standard drinks of alcohol    Types: 3 Standard drinks or equivalent per week    Comment: occassional   Drug use: No   Family History  Problem Relation Age of Onset   Heart attack Father 51       Diet cardiac arrest   Sudden death Father 36   Heart disease Sister 60       triscuspid valve insuff,cardioversion   Hypertension Sister        She is currently in her 71s as well  Hypertension Brother    Heart disease Brother 44       CABG; now age 32   Ovarian cancer Maternal Grandmother    Heart attack Brother 73       Died from cardiac arrest   Hypertension Brother    Hypertension Sister    Heart disease Sister 15       CABG plus valve in 37's;    Sudden death Brother    Heart failure Sister        Died from complications of CHF following bowel surgery   Allergies  Allergen Reactions   Biaxin [Clarithromycin] Rash    SJS   Keflex [Cephalexin] Hives, Itching and Rash   Macrolides And Ketolides Other (See Comments)    Unknown  Other Reaction(s): Stevens-Johnson Syndrome   Erythromycin     Was told not to take d/t medical problems   Penicillins Rash    Review of Systems  Gastrointestinal:  Positive for diarrhea.      Objective:    BP 122/78   Pulse 83   Temp 98.6 F (37 C)   Ht 5\' 8"  (1.727 m)   Wt 162 lb 4 oz (73.6 kg)   SpO2 96%   BMI 24.67 kg/m  BP Readings from Last 3 Encounters:  10/01/23 122/78  09/28/23 102/77  07/27/23 118/62   Wt Readings from Last 3 Encounters:  10/01/23 162 lb 4 oz (73.6 kg)  09/28/23 162 lb 6.4 oz (73.7 kg)  07/27/23 167 lb 9.6 oz (76 kg)    Physical Exam Vitals  and nursing note reviewed.  Constitutional:      General: He is not in acute distress.    Appearance: Normal appearance.  HENT:     Head: Normocephalic.     Right Ear: Tympanic membrane, ear canal and external ear normal.     Left Ear: Tympanic membrane, ear canal and external ear normal.  Eyes:     Extraocular Movements: Extraocular movements intact.     Pupils: Pupils are equal, round, and reactive to light.  Cardiovascular:     Rate and Rhythm: Normal rate and regular rhythm.     Heart sounds: Normal heart sounds.  Pulmonary:     Effort: Pulmonary effort is normal.     Breath sounds: Normal breath sounds. No wheezing.  Abdominal:     General: Bowel sounds are normal.     Tenderness: There is no abdominal tenderness. There is no guarding or rebound.  Musculoskeletal:     Right lower leg: No edema.     Left lower leg: No edema.  Neurological:     General: No focal deficit present.     Mental Status: He is alert and oriented to person, place, and time.      No results found for any visits on 10/01/23.

## 2023-10-02 ENCOUNTER — Other Ambulatory Visit: Payer: Self-pay

## 2023-10-02 ENCOUNTER — Ambulatory Visit: Payer: Self-pay | Admitting: Family Medicine

## 2023-10-02 LAB — CBC WITH DIFFERENTIAL/PLATELET
Absolute Lymphocytes: 910 {cells}/uL (ref 850–3900)
Absolute Monocytes: 755 {cells}/uL (ref 200–950)
Basophils Absolute: 46 {cells}/uL (ref 0–200)
Basophils Relative: 0.5 %
Eosinophils Absolute: 137 {cells}/uL (ref 15–500)
Eosinophils Relative: 1.5 %
HCT: 51.3 % — ABNORMAL HIGH (ref 38.5–50.0)
Hemoglobin: 16.8 g/dL (ref 13.2–17.1)
MCH: 29.1 pg (ref 27.0–33.0)
MCHC: 32.7 g/dL (ref 32.0–36.0)
MCV: 88.9 fL (ref 80.0–100.0)
MPV: 9.4 fL (ref 7.5–12.5)
Monocytes Relative: 8.3 %
Neutro Abs: 7253 {cells}/uL (ref 1500–7800)
Neutrophils Relative %: 79.7 %
Platelets: 308 10*3/uL (ref 140–400)
RBC: 5.77 10*6/uL (ref 4.20–5.80)
RDW: 13.6 % (ref 11.0–15.0)
Total Lymphocyte: 10 %
WBC: 9.1 10*3/uL (ref 3.8–10.8)

## 2023-10-02 LAB — COMPREHENSIVE METABOLIC PANEL WITH GFR
AG Ratio: 1.5 (calc) (ref 1.0–2.5)
ALT: 32 U/L (ref 9–46)
AST: 30 U/L (ref 10–35)
Albumin: 4.3 g/dL (ref 3.6–5.1)
Alkaline phosphatase (APISO): 98 U/L (ref 35–144)
BUN/Creatinine Ratio: 31 (calc) — ABNORMAL HIGH (ref 6–22)
BUN: 57 mg/dL — ABNORMAL HIGH (ref 7–25)
CO2: 18 mmol/L — ABNORMAL LOW (ref 20–32)
Calcium: 9.6 mg/dL (ref 8.6–10.3)
Chloride: 100 mmol/L (ref 98–110)
Creat: 1.82 mg/dL — ABNORMAL HIGH (ref 0.70–1.35)
Globulin: 2.9 g/dL (ref 1.9–3.7)
Glucose, Bld: 177 mg/dL — ABNORMAL HIGH (ref 65–99)
Potassium: 5.7 mmol/L — ABNORMAL HIGH (ref 3.5–5.3)
Sodium: 131 mmol/L — ABNORMAL LOW (ref 135–146)
Total Bilirubin: 1 mg/dL (ref 0.2–1.2)
Total Protein: 7.2 g/dL (ref 6.1–8.1)
eGFR: 40 mL/min/{1.73_m2} — ABNORMAL LOW (ref >=60)

## 2023-10-02 LAB — LIPASE: Lipase: 136 U/L — ABNORMAL HIGH (ref 7–60)

## 2023-10-02 MED ORDER — HYDROCORTISONE (PERIANAL) 2.5 % EX CREA: 1.0000 | TOPICAL_CREAM | Freq: Two times a day (BID) | CUTANEOUS | 0 refills | Status: AC

## 2023-10-02 MED ORDER — HYDROCORTISONE ACETATE 25 MG RE SUPP: 25.0000 mg | Freq: Two times a day (BID) | RECTAL | 0 refills | Status: AC

## 2023-10-03 ENCOUNTER — Other Ambulatory Visit

## 2023-10-03 DIAGNOSIS — R748 Abnormal levels of other serum enzymes: Secondary | ICD-10-CM

## 2023-10-03 DIAGNOSIS — E871 Hypo-osmolality and hyponatremia: Secondary | ICD-10-CM

## 2023-10-03 DIAGNOSIS — N289 Disorder of kidney and ureter, unspecified: Secondary | ICD-10-CM

## 2023-10-04 LAB — BASIC METABOLIC PANEL WITH GFR
BUN/Creatinine Ratio: 35 (calc) — ABNORMAL HIGH (ref 6–22)
BUN: 49 mg/dL — ABNORMAL HIGH (ref 7–25)
CO2: 19 mmol/L — ABNORMAL LOW (ref 20–32)
Calcium: 9.6 mg/dL (ref 8.6–10.3)
Chloride: 98 mmol/L (ref 98–110)
Creat: 1.41 mg/dL — ABNORMAL HIGH (ref 0.70–1.35)
Glucose, Bld: 92 mg/dL (ref 65–99)
Potassium: 5.2 mmol/L (ref 3.5–5.3)
Sodium: 131 mmol/L — ABNORMAL LOW (ref 135–146)
eGFR: 54 mL/min/{1.73_m2} — ABNORMAL LOW (ref >=60)

## 2023-10-04 LAB — LIPASE: Lipase: 145 U/L — ABNORMAL HIGH (ref 7–60)

## 2023-10-05 ENCOUNTER — Ambulatory Visit: Payer: Self-pay | Admitting: Family Medicine

## 2023-10-11 ENCOUNTER — Encounter

## 2023-10-18 ENCOUNTER — Other Ambulatory Visit: Payer: Self-pay | Admitting: Family Medicine

## 2023-10-18 ENCOUNTER — Ambulatory Visit: Attending: Cardiology

## 2023-10-18 DIAGNOSIS — Z7901 Long term (current) use of anticoagulants: Secondary | ICD-10-CM | POA: Insufficient documentation

## 2023-10-18 DIAGNOSIS — I639 Cerebral infarction, unspecified: Secondary | ICD-10-CM | POA: Insufficient documentation

## 2023-10-18 DIAGNOSIS — I823 Embolism and thrombosis of renal vein: Secondary | ICD-10-CM | POA: Insufficient documentation

## 2023-10-18 LAB — POCT INR: INR: 2.1 (ref 2.0–3.0)

## 2023-10-18 NOTE — Patient Instructions (Signed)
 Description   Continue taking Warfarin 1 tablet daily except 1.5 tablets on Mondays and Fridays.  Stay consistent with greens each week.  Recheck INR in 3 weeks.  Coumadin  Clinic [phone] Colonoscopy TBD

## 2023-10-18 NOTE — Progress Notes (Signed)
Please see anticoagulation encounter.

## 2023-10-30 ENCOUNTER — Telehealth (HOSPITAL_COMMUNITY): Payer: Self-pay | Admitting: Pharmacy Technician

## 2023-10-30 NOTE — Telephone Encounter (Signed)
 Auth Submission: NO AUTH NEEDED Site of care: Site of care: [facility] Payer: Medicare A/B, BCBS Supp Medication & CPT/J Code(s) submitted: Leqvio  (Inclisiran) J1306 Diagnosis Code: E78.5 Route of submission (phone, fax, portal):  Phone # Fax # Auth type: Buy/Bill HB Units/visits requested: 284mg  q 6 months Reference number: Approval from: 10/30/23 to 05/24/24    Medicare A/B will cover 80%, BCBS Supp will cover remaining 20%. Med will be covered at 100%.   Jamie Hafford, CPhT [facility] [phone]

## 2023-11-09 ENCOUNTER — Ambulatory Visit: Attending: Cardiology

## 2023-11-09 DIAGNOSIS — Z7901 Long term (current) use of anticoagulants: Secondary | ICD-10-CM | POA: Insufficient documentation

## 2023-11-09 DIAGNOSIS — R760 Raised antibody titer: Secondary | ICD-10-CM | POA: Diagnosis present

## 2023-11-09 DIAGNOSIS — D68312 Antiphospholipid antibody with hemorrhagic disorder: Secondary | ICD-10-CM | POA: Insufficient documentation

## 2023-11-09 DIAGNOSIS — I823 Embolism and thrombosis of renal vein: Secondary | ICD-10-CM | POA: Diagnosis present

## 2023-11-09 DIAGNOSIS — I639 Cerebral infarction, unspecified: Secondary | ICD-10-CM | POA: Diagnosis present

## 2023-11-09 DIAGNOSIS — D6859 Other primary thrombophilia: Secondary | ICD-10-CM | POA: Insufficient documentation

## 2023-11-09 LAB — POCT INR: INR: 2.1 (ref 2.0–3.0)

## 2023-11-09 NOTE — Progress Notes (Signed)
Please see anticoagulation encounter.

## 2023-11-09 NOTE — Patient Instructions (Signed)
 Continue taking Warfarin 1 tablet daily except 1.5 tablets on Mondays and Fridays.  Stay consistent with greens each week.  Recheck INR in 6 weeks.  Coumadin  Clinic [phone] Colonoscopy TBD

## 2023-11-11 ENCOUNTER — Other Ambulatory Visit: Payer: Self-pay | Admitting: Cardiology

## 2023-11-11 ENCOUNTER — Other Ambulatory Visit: Payer: Self-pay | Admitting: Family Medicine

## 2023-11-13 NOTE — Telephone Encounter (Signed)
 Requested Prescriptions  Pending Prescriptions Disp Refills   sertraline  (ZOLOFT ) 50 MG tablet [Pharmacy Med Name: SERTRALINE  HCL 50 MG TABLET] 90 tablet 0    Sig: TAKE 1 TABLET BY MOUTH DAILY     Psychiatry:  Antidepressants - SSRI - sertraline  Failed - 11/13/2023  2:07 PM      Failed - Valid encounter within last 6 months    Recent Outpatient Visits           1 month ago Bloody diarrhea   [place] [facility] Aletha Bene, MD   3 months ago Acute bacterial rhinosinusitis   [place] [facility] Duanne, Butler DASEN, MD   6 months ago Visit for suture removal   [place] [facility] Duanne Butler DASEN, MD   8 months ago History of CVA (cerebrovascular accident)   [place] [facility] Duanne Butler DASEN, MD   1 year ago Acute bacterial rhinosinusitis   [facility] Kayla Jeoffrey RAMAN, FNP              Passed - AST in normal range and within 360 days    AST  Date Value Ref Range Status  10/01/2023 30 10 - 35 U/L Final         Passed - ALT in normal range and within 360 days    ALT  Date Value Ref Range Status  10/01/2023 32 9 - 46 U/L Final         Passed - Completed PHQ-2 or PHQ-9 in the last 360 days

## 2023-11-16 ENCOUNTER — Encounter (INDEPENDENT_AMBULATORY_CARE_PROVIDER_SITE_OTHER): Payer: Self-pay | Admitting: Otolaryngology

## 2023-11-16 ENCOUNTER — Ambulatory Visit (INDEPENDENT_AMBULATORY_CARE_PROVIDER_SITE_OTHER): Admitting: Otolaryngology

## 2023-11-16 VITALS — BP 112/78 | HR 68

## 2023-11-16 DIAGNOSIS — J310 Chronic rhinitis: Secondary | ICD-10-CM | POA: Diagnosis not present

## 2023-11-16 DIAGNOSIS — J338 Other polyp of sinus: Secondary | ICD-10-CM

## 2023-11-16 DIAGNOSIS — R0981 Nasal congestion: Secondary | ICD-10-CM | POA: Diagnosis not present

## 2023-11-16 DIAGNOSIS — J322 Chronic ethmoidal sinusitis: Secondary | ICD-10-CM

## 2023-11-16 DIAGNOSIS — J3489 Other specified disorders of nose and nasal sinuses: Secondary | ICD-10-CM | POA: Diagnosis not present

## 2023-11-16 DIAGNOSIS — J320 Chronic maxillary sinusitis: Secondary | ICD-10-CM

## 2023-11-17 DIAGNOSIS — J338 Other polyp of sinus: Secondary | ICD-10-CM | POA: Insufficient documentation

## 2023-11-17 NOTE — Progress Notes (Signed)
 Patient ID: Kenneth Cline, male   DOB: 1956-03-04, 68 y.o.   MRN: [id number]  Follow-up: Chronic maxillary/ethmoid sinusitis and polyposis   HPI: The patient is a 68 year old male who returns today for his follow-up evaluation.  The patient has a history of chronic maxillary/ethmoid sinusitis and polyposis.  He underwent bilateral endoscopic sinus surgery in April 2024.  At his previous visits, he was noted to have recurrent crusting formation in his sinonasal cavities.  He was treated with medicated (mupirocin /budesonide/amphotericin B) nasal irrigation weekly.  The patient returns today reporting no significant difficulty over the past 4 months.  He has not noted any recent infection.  Currently he denies any facial pain, fever, or visual change.  Exam: General: Communicates without difficulty, well nourished, no acute distress. Head: Normocephalic, no evidence injury, no tenderness, facial buttresses intact without stepoff. Face/sinus: No tenderness to palpation and percussion. Facial movement is normal and symmetric. Eyes: PERRL, EOMI. No scleral icterus, conjunctivae clear. Neuro: CN II exam reveals vision grossly intact.  No nystagmus at any point of gaze. Ears: Auricles well formed without lesions.  Ear canals are intact without mass or lesion.  No erythema or edema is appreciated.  The TMs are intact without fluid. Nose: External evaluation reveals normal support and skin without lesions.  Dorsum is intact.  Anterior rhinoscopy reveals congested mucosa over anterior aspect of inferior turbinates and septum.  A septal perforation is noted.  No purulence noted. Oral:  Oral cavity and oropharynx are intact, symmetric, without erythema or edema.  Mucosa is moist without lesions. Neck: Full range of motion without pain.  There is no significant lymphadenopathy.  No masses palpable.  Thyroid  bed within normal limits to palpation.  Parotid glands and submandibular glands equal bilaterally without mass.   Trachea is midline. Neuro:  CN 2-12 grossly intact.   Assessment: 1.  Chronic rhinitis with moderate nasal mucosal congestion. 2.  History of chronic ethmoid/maxillary sinusitis and polyposis.  No recurrent infection or polyposis is noted today. 3.  A septal perforation is noted.  Plan: 1.  The physical exam findings are reviewed with the patient. 2.  Continue with medicated nasal rinse weekly. 3.  Nasal saline irrigation as needed. 4.  The patient will return for reevaluation in 6 months.

## 2023-11-22 ENCOUNTER — Other Ambulatory Visit: Payer: Self-pay | Admitting: Cardiology

## 2023-12-05 ENCOUNTER — Other Ambulatory Visit (HOSPITAL_COMMUNITY): Payer: Self-pay | Admitting: *Deleted

## 2023-12-06 ENCOUNTER — Ambulatory Visit (HOSPITAL_COMMUNITY)
Admission: RE | Admit: 2023-12-06 | Discharge: 2023-12-06 | Disposition: A | Discharge status: Home / Self Care | Payer: Medicare Other | Source: Ambulatory Visit | Attending: Cardiology | Admitting: Cardiology

## 2023-12-06 DIAGNOSIS — E785 Hyperlipidemia, unspecified: Secondary | ICD-10-CM | POA: Diagnosis present

## 2023-12-06 MED ORDER — INCLISIRAN SODIUM 284 MG/1.5ML ~~LOC~~ SOSY
PREFILLED_SYRINGE | SUBCUTANEOUS | Status: AC
Start: 2023-12-06 — End: 2023-12-06
  Filled 2023-12-06: qty 1.5

## 2023-12-06 MED ORDER — INCLISIRAN SODIUM 284 MG/1.5ML ~~LOC~~ SOSY
284.0000 mg | PREFILLED_SYRINGE | Freq: Once | SUBCUTANEOUS | Status: AC
  Administered 2023-12-06: 284 mg via SUBCUTANEOUS

## 2023-12-21 ENCOUNTER — Ambulatory Visit

## 2023-12-31 ENCOUNTER — Other Ambulatory Visit: Payer: Self-pay | Admitting: Cardiology

## 2023-12-31 DIAGNOSIS — I823 Embolism and thrombosis of renal vein: Secondary | ICD-10-CM

## 2024-01-06 ENCOUNTER — Other Ambulatory Visit: Payer: Self-pay | Admitting: Family Medicine

## 2024-01-07 ENCOUNTER — Ambulatory Visit: Attending: Cardiology | Admitting: *Deleted

## 2024-01-07 DIAGNOSIS — Z7901 Long term (current) use of anticoagulants: Secondary | ICD-10-CM | POA: Diagnosis present

## 2024-01-07 DIAGNOSIS — I823 Embolism and thrombosis of renal vein: Secondary | ICD-10-CM | POA: Diagnosis present

## 2024-01-07 DIAGNOSIS — D6859 Other primary thrombophilia: Secondary | ICD-10-CM | POA: Diagnosis present

## 2024-01-07 DIAGNOSIS — D68312 Antiphospholipid antibody with hemorrhagic disorder: Secondary | ICD-10-CM | POA: Insufficient documentation

## 2024-01-07 DIAGNOSIS — I639 Cerebral infarction, unspecified: Secondary | ICD-10-CM | POA: Insufficient documentation

## 2024-01-07 DIAGNOSIS — R760 Raised antibody titer: Secondary | ICD-10-CM | POA: Insufficient documentation

## 2024-01-07 LAB — POCT INR: INR: 1.8 — AB (ref 2.0–3.0)

## 2024-01-07 NOTE — Patient Instructions (Signed)
 Description   INR-1.8; Today take 2 tablets of warfarin then continue taking Warfarin 1 tablet daily except 1.5 tablets on Mondays and Fridays.  Stay consistent with greens each week.  Recheck INR in 5 weeks.  Coumadin  Clinic [phone] Colonoscopy TBD

## 2024-01-07 NOTE — Progress Notes (Signed)
 Description   INR-1.8; Today take 2 tablets of warfarin then continue taking Warfarin 1 tablet daily except 1.5 tablets on Mondays and Fridays.  Stay consistent with greens each week.  Recheck INR in 5 weeks.  Coumadin  Clinic [phone] Colonoscopy TBD

## 2024-01-08 ENCOUNTER — Other Ambulatory Visit (HOSPITAL_COMMUNITY): Payer: Self-pay | Admitting: Cardiology

## 2024-01-15 ENCOUNTER — Encounter: Payer: Self-pay | Admitting: Family Medicine

## 2024-01-15 ENCOUNTER — Other Ambulatory Visit: Payer: Self-pay | Admitting: Family Medicine

## 2024-01-15 MED ORDER — NIRMATRELVIR/RITONAVIR (PAXLOVID) TABLET (RENAL DOSING): 2.0000 | ORAL_TABLET | Freq: Two times a day (BID) | ORAL | 0 refills | Status: AC

## 2024-01-16 ENCOUNTER — Other Ambulatory Visit: Payer: Self-pay | Admitting: Family Medicine

## 2024-01-25 ENCOUNTER — Telehealth: Payer: Self-pay | Admitting: *Deleted

## 2024-01-25 NOTE — Transitions of Care (Post Inpatient/ED Visit) (Signed)
   01/25/2024  Name: Kenneth Cline MRN: [id number] DOB: Jul 25, 1955  Today's TOC FU Call Status: Today's TOC FU Call Status:: Unsuccessful Call (1st Attempt) Unsuccessful Call (1st Attempt) Date: 01/25/24  Attempted to reach the patient regarding the most recent Inpatient/ED visit.  Follow Up Plan: Additional outreach attempts will be made to reach the patient to complete the Transitions of Care (Post Inpatient/ED visit) call.   Mliss Creed [facility], BSN RN Care Manager/ Transition of Care [place]/ [facility] [phone]

## 2024-01-28 ENCOUNTER — Telehealth: Payer: Self-pay

## 2024-01-28 NOTE — Transitions of Care (Post Inpatient/ED Visit) (Signed)
   01/28/2024  Name: Kenneth Cline MRN: [id number] DOB: [birth year]  Today's TOC FU Call Status: Today's TOC FU Call Status:: Unsuccessful Call (2nd Attempt) Unsuccessful Call (2nd Attempt) Date: 01/28/24  Attempted to reach the patient regarding the most recent Inpatient/ED visit.  Follow Up Plan: Additional outreach attempts will be made to reach the patient to complete the Transitions of Care (Post Inpatient/ED visit) call.   Alan Ee, RN, BSN, CEN [employer]- Transition of Care Team.  [facility] [phone]

## 2024-01-29 ENCOUNTER — Telehealth: Payer: Self-pay

## 2024-01-29 NOTE — Transitions of Care (Post Inpatient/ED Visit) (Signed)
   01/29/2024  Name: Kenneth Cline MRN: [id number] DOB: 1956-02-09  Today's TOC FU Call Status: Today's TOC FU Call Status:: Unsuccessful Call (3rd Attempt) Unsuccessful Call (3rd Attempt) Date: 01/29/24  Attempted to reach the patient regarding the most recent Inpatient/ED visit.  Follow Up Plan: No further outreach attempts will be made at this time. We have been unable to contact the patient.  [name] J. Gillie Crisci RN, MSN [facility], [facility] RN Care Manager Direct Dial : [phone]  Fax: [phone] Website: [email or web address]

## 2024-02-11 ENCOUNTER — Ambulatory Visit: Attending: Cardiovascular Disease

## 2024-02-11 DIAGNOSIS — I639 Cerebral infarction, unspecified: Secondary | ICD-10-CM | POA: Diagnosis present

## 2024-02-11 DIAGNOSIS — D6862 Lupus anticoagulant syndrome: Secondary | ICD-10-CM | POA: Insufficient documentation

## 2024-02-11 DIAGNOSIS — I823 Embolism and thrombosis of renal vein: Secondary | ICD-10-CM | POA: Insufficient documentation

## 2024-02-11 DIAGNOSIS — R760 Raised antibody titer: Secondary | ICD-10-CM | POA: Insufficient documentation

## 2024-02-11 DIAGNOSIS — D6859 Other primary thrombophilia: Secondary | ICD-10-CM | POA: Diagnosis present

## 2024-02-11 DIAGNOSIS — D68312 Antiphospholipid antibody with hemorrhagic disorder: Secondary | ICD-10-CM | POA: Insufficient documentation

## 2024-02-11 DIAGNOSIS — Z7901 Long term (current) use of anticoagulants: Secondary | ICD-10-CM | POA: Diagnosis present

## 2024-02-11 LAB — POCT INR: INR: 1.5 — AB (ref 2.0–3.0)

## 2024-02-11 NOTE — Patient Instructions (Signed)
 Today take 2.5 tablets of warfarin today only then continue taking Warfarin 1 tablet daily except 1.5 tablets on Mondays and Fridays.  Stay consistent with greens each week.  Recheck INR in 4 weeks. Coumadin  Clinic [phone] Colonoscopy TBD

## 2024-02-11 NOTE — Progress Notes (Signed)
 INR 1.5 Please see anticoagulation encounter Today take 2.5 tablets of warfarin today only then continue taking Warfarin 1 tablet daily except 1.5 tablets on Mondays and Fridays.  Stay consistent with greens each week.  Recheck INR in 4 weeks. Coumadin  Clinic [phone] Colonoscopy TBD

## 2024-02-18 ENCOUNTER — Other Ambulatory Visit: Payer: Self-pay | Admitting: Family Medicine

## 2024-02-26 ENCOUNTER — Other Ambulatory Visit: Payer: Self-pay

## 2024-02-27 ENCOUNTER — Other Ambulatory Visit (INDEPENDENT_AMBULATORY_CARE_PROVIDER_SITE_OTHER): Payer: Self-pay | Admitting: Otolaryngology

## 2024-02-27 MED ORDER — FLUTICASONE PROPIONATE 50 MCG/ACT NA SUSP
2.0000 | Freq: Every day | NASAL | 10 refills | Status: AC
Start: 2024-02-27 — End: ?

## 2024-02-28 ENCOUNTER — Ambulatory Visit: Payer: Medicare Other | Admitting: *Deleted

## 2024-02-28 VITALS — Ht 67.0 in | Wt 162.0 lb

## 2024-02-28 DIAGNOSIS — Z0000 Encounter for general adult medical examination without abnormal findings: Secondary | ICD-10-CM | POA: Diagnosis not present

## 2024-02-28 MED ORDER — EZETIMIBE 10 MG PO TABS: 10.0000 mg | ORAL_TABLET | Freq: Every day | ORAL | 2 refills | Status: AC

## 2024-02-28 NOTE — Progress Notes (Signed)
 Subjective:   Kenneth Cline is a 68 y.o. male who presents for a Medicare Annual Wellness Visit.    I discussed the limitations of evaluation and management by telemedicine. The patient expressed understanding and agreed to proceed.   Allergies (verified) Biaxin [clarithromycin], Keflex [cephalexin], Macrolides and ketolides, Erythromycin, and Penicillins   History: Past Medical History:  Diagnosis Date   Adrenal hemorrhage 07/2014   Following shoulder surgery   Adrenal insufficiency, primary, hemorrhagic 07/2014   due to bilateral adrenal hemorrhage   Antiphospholipid antibody positive 2016   cardiolipin ab positive, B2-glyco positive, lupus anticoagulant positive   Antiphospholipid syndrome    Autoimmune hepatitis (HCC)    PSC   Cellulitis 09/2018   septic shock, compartment syndrome s/p fasciotomy RLE at [facility]   CVA (cerebral infarction) 04/2015   3 small areas of infarct in both the cerebrum as well as the cerebellum -- , located with diplopia   Cyclic neutropenia (HCC)    Status post bone marrow transplant   Dyslipidemia    Erectile dysfunction    Essential hypertension    Family history of premature coronary artery disease    2 brothers, one sister and father all with CAD.  One sister with valve disease.   GERD (gastroesophageal reflux disease)    H/O acute cholangitis 08/2014   Complicated by septic shock, acute pancreatitis and acute choledocholithiasis;    Lupus anticoagulant with hemorrhagic disorder    Previously on Xarelto. Now on warfarin   Overweight (BMI 25.0-29.9)    PSC (primary sclerosing cholangitis) (HCC)    managed by Dr. 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 Likely cholangitis   ERCP W/ PLASTIC STENT PLACEMENT  09/09/2014   [facility] - Dr. Norleen Sar   ERCP W/ SPHINCTEROTOMY AND BALLOON DILATION  11/10/2014   [facility] -- Removal of biliary stent,, to PACU duodenitis   ETHMOIDECTOMY Bilateral 08/04/2022   Procedure: BILATERAL ENDOSCOPIC ETHMOIDECTOMY;  Surgeon: Karis Clunes, MD;  Location: [facility] OR;  Service: ENT;  Laterality: Bilateral;   LAPAROSCOPIC CHOLECYSTECTOMY W/ CHOLANGIOGRAPHY  11/13/2014   [facility], Dr. Rosina Jansky [place]   MAXILLARY ANTROSTOMY Bilateral 08/04/2022   Procedure: BILATERAL ENDOSCOPIC MAXILLARY ANTROSTOMY WITH TISSUE REMOVAL;  Surgeon: Karis Clunes, MD;  Location: [facility] OR;  Service: ENT;  Laterality: Bilateral;   NASAL SEPTOPLASTY W/ TURBINOPLASTY Bilateral 08/04/2022   Procedure: NASAL SEPTOPLASTY WITH BILATERAL TURBINATE REDUCTION;  Surgeon: Karis Clunes, MD;  Location: [facility] OR;  Service: ENT;  Laterality: Bilateral;   NM MYOCAR PERF WALL MOTION  06/28/2006   EF 70% , LV systolic fx norm.   ROTATOR CUFF REPAIR     TRANSTHORACIC ECHOCARDIOGRAM  04/2015   No pericardial effusion. No shunt. Full study: Basal septal LVH. Pseudo-normal filling (GR 2 DD). EF 57%.   TRANSTHORACIC ECHOCARDIOGRAM  08/2015   [facility]): Normal LV size. Basal septal hypertrophy. EF 57%. Pseudo-normal filling. Mild focal aortic valve thickening. (Study was done to evaluate for possible endocarditis)   Family History  Problem Relation Age of Onset   Heart  attack Father 42       Diet cardiac arrest   Sudden death Father 38   Heart disease Sister 55       triscuspid valve insuff,cardioversion   Hypertension Sister        She is currently in her 10s as well   Hypertension Brother    Heart disease Brother 49       CABG; now age 40   Ovarian cancer Maternal Grandmother    Heart attack Brother 50       Died from cardiac arrest   Hypertension Brother    Hypertension Sister    Heart disease Sister 41       CABG plus valve in 56's;    Sudden death Brother    Heart failure Sister        Died from complications of CHF following bowel surgery   Social History   Occupational History    Employer: [employer]  Tobacco Use   Smoking status: Former    Current packs/day: 0.50    Types: Cigarettes   Smokeless tobacco: Never  Vaping Use   Vaping status: Never Used  Substance and Sexual Activity   Alcohol use: Not Currently    Alcohol/week: 3.0 standard drinks of alcohol    Types: 3 Standard drinks or equivalent per week    Comment: occassional   Drug use: No   Sexual activity: Not Currently    Birth control/protection: Surgical, None   Tobacco Counseling Counseling given: Not Answered  SDOH Screenings   Food Insecurity: No Food Insecurity (02/24/2024)  Housing: Unknown (02/24/2024)  Transportation Needs: No Transportation Needs (02/24/2024)  Utilities: Low Risk  (01/23/2024)   Received from [facility]  Alcohol Screen: Low Risk  (02/21/2023)  Depression (PHQ2-9): Low Risk  (02/28/2024)  Financial Resource Strain: Low Risk  (02/24/2024)  Physical Activity: Insufficiently Active (02/24/2024)  Social Connections: Moderately Integrated (02/24/2024)  Stress: No Stress Concern Present (02/28/2024)  Tobacco Use: Medium Risk (02/28/2024)   Depression Screen    02/28/2024   10:08 AM 02/22/2023    9:59 AM 01/18/2022    8:36 AM 01/02/2022    3:22 PM 09/02/2021   10:07 AM 01/13/2021    9:09 AM 01/05/2021   10:51 AM  PHQ 2/9 Scores  PHQ - 2 Score 0 0 0  0 0 0 0  PHQ- 9 Score 1           Goals Addressed   None    Visit info / Clinical Intake: Medicare Wellness Visit Mode:: In-person (required for WTM) Interpreter Needed?: No Pre-visit prep was completed: no AWV questionnaire completed by patient prior to visit?: no Living arrangements:: lives with spouse/significant other Patient's Overall Health Status Rating: good Typical amount of pain: some Does pain affect daily life?: no Are you currently prescribed opioids?: no  Dietary Habits and Nutritional Risks How many meals a day?: 3 Eats fruit and vegetables daily?: yes Most meals are obtained by: preparing own meals Diabetic:: (!) yes  Functional Status Activities of Daily Living (to include ambulation/medication): Independent Ambulation: Independent Medication Administration: Independent Home Management: Independent Manage your  own finances?: (!) no Primary transportation is: driving Concerns about vision?: no *vision screening is required for WTM* Concerns about hearing?: no  Fall Screening Falls in the past year?: 0 Number of falls in past year: 0 Was there an injury with Fall?: 0 Fall Risk Category Calculator: 0 Patient Fall Risk Level: Low Fall Risk  Fall Risk Patient at Risk for Falls Due to: No Fall Risks Fall risk Follow up: Falls evaluation completed; Education provided; Falls prevention discussed  Home and Transportation Safety: All rugs have non-skid backing?: (!) no All stairs or steps have railings?: yes Grab bars in the bathtub or shower?: (!) no Have non-skid surface in bathtub or shower?: (!) no Good home lighting?: yes Regular seat belt use?: yes Hospital stays in the last year:: (!) yes How many hospital stays:: 1  Cognitive Assessment Difficulty concentrating, remembering, or making decisions? : no Will 6CIT or Mini Cog be Completed: yes What year is it?: 0 points What month is it?: 0 points Give patient an address phrase to remember (5  components): Its very sunny outside today in November About what time is it?: 0 points Count backwards from 20 to 1: 0 points Say the months of the year in reverse: 2 points Repeat the address phrase from earlier: 0 points 6 CIT Score: 2 points  Advance Directives (For Healthcare) Does Patient Have a Medical Advance Directive?: Yes Does patient want to make changes to medical advance directive?: No - Patient declined  Reviewed/Updated  Reviewed/Updated: All        Objective:    There were no vitals filed for this visit. There is no height or weight on file to calculate BMI.  Current Medications (verified) Outpatient Encounter Medications as of 02/28/2024  Medication Sig   Calcium  Carb-Cholecalciferol (CALCIUM  + VITAMIN D3 PO) Take 1 tablet by mouth daily.   clonazePAM  (KLONOPIN ) 0.5 MG tablet Take 0.5 mg by mouth 2 (two) times daily as needed for anxiety.   cyclobenzaprine  (FLEXERIL ) 10 MG tablet Take 1 tablet (10 mg total) by mouth 3 (three) times daily as needed for muscle spasms.   empagliflozin  (JARDIANCE ) 25 MG TABS tablet Take 1 tablet (25 mg total) by mouth daily before breakfast.   ezetimibe  (ZETIA ) 10 MG tablet TAKE 1 TABLET BY MOUTH DAILY   ferrous sulfate 325 (65 FE) MG tablet Take 325 mg by mouth daily with breakfast.   fludrocortisone  (FLORINEF ) 0.1 MG tablet Take 0.1 mg by mouth daily.   fluticasone  (FLONASE ) 50 MCG/ACT nasal spray Place 2 sprays into both nostrils daily.   gabapentin  (NEURONTIN ) 300 MG capsule TAKE 1 CAPSULE BY MOUTH 3 TIMES A DAY MAY INCREASE TO UP TO 6 CAPSULES A DAY   guaiFENesin  (MUCINEX ) 600 MG 12 hr tablet Take 600-1,200 mg by mouth 2 (two) times daily as needed for to loosen phlegm or cough.   hydrocortisone  (ANUSOL -HC) 2.5 % rectal cream Place 1 Application rectally 2 (two) times daily.   hydrocortisone  (ANUSOL -HC) 25 MG suppository Place 1 suppository (25 mg total) rectally 2 (two) times daily.   inclisiran (LEQVIO ) 284 MG/1.5ML SOSY  injection Inject 1.5 mLs (284 mg total) into the skin.   levothyroxine  (SYNTHROID ) 75 MCG tablet Take 1 tablet (75 mcg total) by mouth daily before breakfast. Alternate taking 75 mcg  ( whole tablet)  with 37.5 mcg ( 1/2 tablet) by mouth every other day   magnesium  oxide (MAG-OX) 400 MG tablet Take 400 mg by mouth daily.   metFORMIN  (GLUCOPHAGE -XR) 500 MG 24  hr tablet TAKE 2 TABLETS BY MOUTH TWICE A DAY WITH A MEAL   metoprolol  succinate (TOPROL -XL) 50 MG 24 hr tablet TAKE 1 TABLET BY MOUTH DAILY WITH OR IMMEDIATELY FOLLOWING A MEAL   mometasone (NASONEX) 50 MCG/ACT nasal spray Place 2 sprays into the nose daily as needed (allergies).   ondansetron  (ZOFRAN ) 4 MG tablet Take 1 tablet (4 mg total) by mouth every 8 (eight) hours as needed for nausea or vomiting.   oxyCODONE -acetaminophen  (PERCOCET/ROXICET) 5-325 MG tablet Take 1 tablet by mouth every 6 (six) hours as needed for severe pain (pain score 7-10).   pantoprazole  (PROTONIX ) 40 MG tablet TAKE 1 TABLET BY MOUTH 2 TIMES A DAY   predniSONE  (DELTASONE ) 1 MG tablet Take 8mg  PO Q AM and 2.5mg  PO Q PM   predniSONE  (DELTASONE ) 5 MG tablet Take 1 tablet (5 mg total) by mouth in the morning and at bedtime.   Probiotic Product (PROBIOTIC DAILY PO) Take 1 capsule by mouth daily.   sertraline  (ZOLOFT ) 50 MG tablet TAKE 1 TABLET BY MOUTH DAILY   ursodiol  (ACTIGALL ) 500 MG tablet TAKE 1 TABLET BY MOUTH EVERY MORNING AND 1 TABLET EVERY NIGHT AT BEDTIME   warfarin (COUMADIN ) 5 MG tablet TAKE 1 TO 1 AND 1/2 TABLETS BY MOUTH DAILY AS DIRECTED BY COUMADIN  CLINIC   No facility-administered encounter medications on file as of 02/28/2024.   Hearing/Vision screen Hearing Screening - Comments:: No trouble hearing Vision Screening - Comments:: [place]    Up to date Immunizations and Health Maintenance Health Maintenance  Topic Date Due   COVID-19 Vaccine (4 - 2025-26 season) 12/24/2023   Diabetic kidney evaluation - Urine ACR  02/29/2024   Diabetic kidney  evaluation - eGFR measurement  10/02/2024   Medicare Annual Wellness (AWV)  02/27/2025   Colonoscopy  03/03/2025   DTaP/Tdap/Td (3 - Tdap) 01/28/2030   Pneumococcal Vaccine: 50+ Years  Completed   Influenza Vaccine  Completed   Hepatitis C Screening  Completed   Zoster Vaccines- Shingrix  Completed   Meningococcal B Vaccine  Aged Out        Assessment/Plan:  This is a routine wellness examination for Kenneth Cline.  Patient Care Team: Duanne Butler DASEN, MD as PCP - General (Family Medicine) Anner Alm ORN, MD as PCP - Cardiology (Cardiology) Duke, Jon Garre, PA as Physician Assistant (Cardiology)  I have personally reviewed and noted the following in the patient's chart:   Medical and social history Use of alcohol, tobacco or illicit drugs  Current medications and supplements including opioid prescriptions. Functional ability and status Nutritional status Physical activity Advanced directives List of other physicians Hospitalizations, surgeries, and ER visits in previous 12 months Vitals Screenings to include cognitive, depression, and falls Referrals and appointments  No orders of the defined types were placed in this encounter.  In addition, I have reviewed and discussed with patient certain preventive protocols, quality metrics, and best practice recommendations. A written personalized care plan for preventive services as well as general preventive health recommendations were provided to patient.   Mliss Graff, LPN   [id number]   Return in 1 year (on 02/27/2025).  After Visit Summary: (MyChart) Due to this being a telephonic visit, the after visit summary with patients personalized plan was offered to patient via MyChart   Nurse Notes:

## 2024-02-28 NOTE — Patient Instructions (Signed)
 Kenneth Cline , Thank you for taking time to come for your Medicare Wellness Visit. I appreciate your ongoing commitment to your health goals. Please review the following plan we discussed and let me know if I can assist you in the future.   Screening recommendations/referrals: Colonoscopy:  Recommended yearly ophthalmology/optometry visit for glaucoma screening and checkup Recommended yearly dental visit for hygiene and checkup  Vaccinations: Influenza vaccine:  Pneumococcal vaccine:  Tdap vaccine:  Shingles vaccine:       Preventive Care 65 Years and Older, Male Preventive care refers to lifestyle choices and visits with your health care provider that can promote health and wellness. What does preventive care include? A yearly physical exam. This is also called an annual well check. Dental exams once or twice a year. Routine eye exams. Ask your health care provider how often you should have your eyes checked. Personal lifestyle choices, including: Daily care of your teeth and gums. Regular physical activity. Eating a healthy diet. Avoiding tobacco and drug use. Limiting alcohol use. Practicing safe sex. Taking low doses of aspirin every day. Taking vitamin and mineral supplements as recommended by your health care provider. What happens during an annual well check? The services and screenings done by your health care provider during your annual well check will depend on your age, overall health, lifestyle risk factors, and family history of disease. Counseling  Your health care provider may ask you questions about your: Alcohol use. Tobacco use. Drug use. Emotional well-being. Home and relationship well-being. Sexual activity. Eating habits. History of falls. Memory and ability to understand (cognition). Work and work astronomer. Screening  You may have the following tests or measurements: Height, weight, and BMI. Blood pressure. Lipid and cholesterol levels. These may  be checked every 5 years, or more frequently if you are over 52 years old. Skin check. Lung cancer screening. You may have this screening every year starting at age 30 if you have a 30-pack-year history of smoking and currently smoke or have quit within the past 15 years. Fecal occult blood test (FOBT) of the stool. You may have this test every year starting at age 66. Flexible sigmoidoscopy or colonoscopy. You may have a sigmoidoscopy every 5 years or a colonoscopy every 10 years starting at age 63. Prostate cancer screening. Recommendations will vary depending on your family history and other risks. Hepatitis C blood test. Hepatitis B blood test. Sexually transmitted disease (STD) testing. Diabetes screening. This is done by checking your blood sugar (glucose) after you have not eaten for a while (fasting). You may have this done every 1-3 years. Abdominal aortic aneurysm (AAA) screening. You may need this if you are a current or former smoker. Osteoporosis. You may be screened starting at age 49 if you are at high risk. Talk with your health care provider about your test results, treatment options, and if necessary, the need for more tests. Vaccines  Your health care provider may recommend certain vaccines, such as: Influenza vaccine. This is recommended every year. Tetanus, diphtheria, and acellular pertussis (Tdap, Td) vaccine. You may need a Td booster every 10 years. Zoster vaccine. You may need this after age 44. Pneumococcal 13-valent conjugate (PCV13) vaccine. One dose is recommended after age 28. Pneumococcal polysaccharide (PPSV23) vaccine. One dose is recommended after age 41. Talk to your health care provider about which screenings and vaccines you need and how often you need them. This information is not intended to replace advice given to you by your health care  provider. Make sure you discuss any questions you have with your health care provider. Document Released: 05/07/2015  Document Revised: 12/29/2015 Document Reviewed: 02/09/2015 Elsevier Interactive Patient Education  2017 [employer].  Fall Prevention in the Home Falls can cause injuries. They can happen to people of all ages. There are many things you can do to make your home safe and to help prevent falls. What can I do on the outside of my home? Regularly fix the edges of walkways and driveways and fix any cracks. Remove anything that might make you trip as you walk through a door, such as a raised step or threshold. Trim any bushes or trees on the path to your home. Use bright outdoor lighting. Clear any walking paths of anything that might make someone trip, such as rocks or tools. Regularly check to see if handrails are loose or broken. Make sure that both sides of any steps have handrails. Any raised decks and porches should have guardrails on the edges. Have any leaves, snow, or ice cleared regularly. Use sand or salt on walking paths during winter. Clean up any spills in your garage right away. This includes oil or grease spills. What can I do in the bathroom? Use night lights. Install grab bars by the toilet and in the tub and shower. Do not use towel bars as grab bars. Use non-skid mats or decals in the tub or shower. If you need to sit down in the shower, use a plastic, non-slip stool. Keep the floor dry. Clean up any water that spills on the floor as soon as it happens. Remove soap buildup in the tub or shower regularly. Attach bath mats securely with double-sided non-slip rug tape. Do not have throw rugs and other things on the floor that can make you trip. What can I do in the bedroom? Use night lights. Make sure that you have a light by your bed that is easy to reach. Do not use any sheets or blankets that are too big for your bed. They should not hang down onto the floor. Have a firm chair that has side arms. You can use this for support while you get dressed. Do not have throw rugs  and other things on the floor that can make you trip. What can I do in the kitchen? Clean up any spills right away. Avoid walking on wet floors. Keep items that you use a lot in easy-to-reach places. If you need to reach something above you, use a strong step stool that has a grab bar. Keep electrical cords out of the way. Do not use floor polish or wax that makes floors slippery. If you must use wax, use non-skid floor wax. Do not have throw rugs and other things on the floor that can make you trip. What can I do with my stairs? Do not leave any items on the stairs. Make sure that there are handrails on both sides of the stairs and use them. Fix handrails that are broken or loose. Make sure that handrails are as long as the stairways. Check any carpeting to make sure that it is firmly attached to the stairs. Fix any carpet that is loose or worn. Avoid having throw rugs at the top or bottom of the stairs. If you do have throw rugs, attach them to the floor with carpet tape. Make sure that you have a light switch at the top of the stairs and the bottom of the stairs. If you do not have  them, ask someone to add them for you. What else can I do to help prevent falls? Wear shoes that: Do not have high heels. Have rubber bottoms. Are comfortable and fit you well. Are closed at the toe. Do not wear sandals. If you use a stepladder: Make sure that it is fully opened. Do not climb a closed stepladder. Make sure that both sides of the stepladder are locked into place. Ask someone to hold it for you, if possible. Clearly mark and make sure that you can see: Any grab bars or handrails. First and last steps. Where the edge of each step is. Use tools that help you move around (mobility aids) if they are needed. These include: Canes. Walkers. Scooters. Crutches. Turn on the lights when you go into a dark area. Replace any light bulbs as soon as they burn out. Set up your furniture so you have a  clear path. Avoid moving your furniture around. If any of your floors are uneven, fix them. If there are any pets around you, be aware of where they are. Review your medicines with your doctor. Some medicines can make you feel dizzy. This can increase your chance of falling. Ask your doctor what other things that you can do to help prevent falls. This information is not intended to replace advice given to you by your health care provider. Make sure you discuss any questions you have with your health care provider. Document Released: 02/04/2009 Document Revised: 09/16/2015 Document Reviewed: 05/15/2014 Elsevier Interactive Patient Education  2017 [employer].

## 2024-03-04 ENCOUNTER — Other Ambulatory Visit: Payer: Self-pay

## 2024-03-04 DIAGNOSIS — I823 Embolism and thrombosis of renal vein: Secondary | ICD-10-CM

## 2024-03-04 MED ORDER — WARFARIN SODIUM 5 MG PO TABS: 5.0000 mg | ORAL_TABLET | Freq: Every day | ORAL | 1 refills | Status: DC

## 2024-03-04 NOTE — Progress Notes (Signed)
 In person visit

## 2024-03-10 ENCOUNTER — Ambulatory Visit: Attending: Cardiology

## 2024-03-10 DIAGNOSIS — I639 Cerebral infarction, unspecified: Secondary | ICD-10-CM | POA: Insufficient documentation

## 2024-03-10 DIAGNOSIS — R760 Raised antibody titer: Secondary | ICD-10-CM | POA: Diagnosis present

## 2024-03-10 DIAGNOSIS — Z7901 Long term (current) use of anticoagulants: Secondary | ICD-10-CM | POA: Insufficient documentation

## 2024-03-10 DIAGNOSIS — D6862 Lupus anticoagulant syndrome: Secondary | ICD-10-CM | POA: Diagnosis present

## 2024-03-10 DIAGNOSIS — D68312 Antiphospholipid antibody with hemorrhagic disorder: Secondary | ICD-10-CM | POA: Diagnosis present

## 2024-03-10 DIAGNOSIS — I823 Embolism and thrombosis of renal vein: Secondary | ICD-10-CM | POA: Diagnosis present

## 2024-03-10 DIAGNOSIS — D6859 Other primary thrombophilia: Secondary | ICD-10-CM | POA: Insufficient documentation

## 2024-03-10 LAB — POCT INR: INR: 2 (ref 2.0–3.0)

## 2024-03-10 NOTE — Patient Instructions (Signed)
 Today take 2 tablets of warfarin then continue taking Warfarin 1 tablet daily except 1.5 tablets on Mondays and Fridays.  Stay consistent with greens each week.  Recheck INR in 6 weeks. Coumadin  Clinic [phone]

## 2024-03-10 NOTE — Progress Notes (Signed)
 INR 2.0 Please see anticoagulation encounter Today take 2 tablets of warfarin then continue taking Warfarin 1 tablet daily except 1.5 tablets on Mondays and Fridays.  Stay consistent with greens each week.  Recheck INR in 6 weeks. Coumadin  Clinic [phone]

## 2024-03-24 ENCOUNTER — Other Ambulatory Visit: Payer: Self-pay

## 2024-03-24 DIAGNOSIS — I823 Embolism and thrombosis of renal vein: Secondary | ICD-10-CM

## 2024-03-24 MED ORDER — WARFARIN SODIUM 5 MG PO TABS: 5.0000 mg | ORAL_TABLET | Freq: Every day | ORAL | 1 refills | Status: AC

## 2024-03-30 ENCOUNTER — Other Ambulatory Visit: Payer: Self-pay | Admitting: Family Medicine

## 2024-03-30 DIAGNOSIS — E118 Type 2 diabetes mellitus with unspecified complications: Secondary | ICD-10-CM

## 2024-03-31 ENCOUNTER — Telehealth: Payer: Self-pay

## 2024-03-31 DIAGNOSIS — Z8673 Personal history of transient ischemic attack (TIA), and cerebral infarction without residual deficits: Secondary | ICD-10-CM

## 2024-03-31 DIAGNOSIS — M25522 Pain in left elbow: Secondary | ICD-10-CM

## 2024-03-31 DIAGNOSIS — M71122 Other infective bursitis, left elbow: Secondary | ICD-10-CM

## 2024-03-31 NOTE — Telephone Encounter (Signed)
 Prescription Request  03/31/2024  LOV: 10/01/23  What is the name of the medication or equipment? gabapentin  (NEURONTIN ) 300 MG capsule [[id number]]   Have you contacted your pharmacy to request a refill? Yes   Which pharmacy would you like this sent to?  [facility] [id number] [place], [place] - [address] [place] [address] Phone: [phone] Fax: [phone]    Patient notified that their request is being sent to the clinical staff for review and that they should receive a response within 2 business days.   Please advise at [facility] [phone]

## 2024-04-02 MED ORDER — GABAPENTIN 300 MG PO CAPS: ORAL_CAPSULE | ORAL | 1 refills | Status: AC

## 2024-04-02 NOTE — Telephone Encounter (Signed)
 Requested Prescriptions  Pending Prescriptions Disp Refills   gabapentin  (NEURONTIN ) 300 MG capsule 270 capsule 1    Sig: TAKE 1 CAPSULE BY MOUTH 3 TIMES A DAY MAY INCREASE TO UP TO 6 CAPSULES A DAY     Neurology: Anticonvulsants - gabapentin  Failed - 04/02/2024 12:13 PM      Failed - Cr in normal range and within 360 days    Creat  Date Value Ref Range Status  10/03/2023 1.41 (H) 0.70 - 1.35 mg/dL Final   Creatinine, Urine  Date Value Ref Range Status  03/01/2023 36 20 - 320 mg/dL Final         Passed - Completed PHQ-2 or PHQ-9 in the last 360 days      Passed - Valid encounter within last 12 months    Recent Outpatient Visits           6 months ago Bloody diarrhea   [place] [facility] Aletha Bene, MD   8 months ago Acute bacterial rhinosinusitis   [place] [facility] Duanne, Butler DASEN, MD   11 months ago Visit for suture removal   [place] [facility] Duanne Butler DASEN, MD   1 year ago History of CVA (cerebrovascular accident)   [place] [facility] Duanne Butler DASEN, MD   2 years ago Acute bacterial rhinosinusitis    [facility] Kayla Jeoffrey RAMAN, FNP

## 2024-04-21 ENCOUNTER — Ambulatory Visit: Attending: Cardiology | Admitting: *Deleted

## 2024-04-21 DIAGNOSIS — Z7901 Long term (current) use of anticoagulants: Secondary | ICD-10-CM | POA: Insufficient documentation

## 2024-04-21 DIAGNOSIS — I639 Cerebral infarction, unspecified: Secondary | ICD-10-CM | POA: Insufficient documentation

## 2024-04-21 DIAGNOSIS — Z5181 Encounter for therapeutic drug level monitoring: Secondary | ICD-10-CM | POA: Diagnosis present

## 2024-04-21 DIAGNOSIS — I823 Embolism and thrombosis of renal vein: Secondary | ICD-10-CM | POA: Insufficient documentation

## 2024-04-21 LAB — POCT INR: POC INR: 1.9

## 2024-04-21 NOTE — Progress Notes (Signed)
 Lab Results  Component Value Date   INR 1.9 04/21/2024   INR 2.0 03/10/2024   INR 1.5 (A) 02/11/2024    Description   INR 1.9; Today take 2 tablets of warfarin then START taking Warfarin 1 tablet daily except 1.5 tablets on Mondays, Wednesday and Fridays.  Stay consistent with greens each week.  Recheck INR in 3 weeks. Coumadin  Clinic [phone]

## 2024-04-21 NOTE — Patient Instructions (Signed)
 Description   INR 1.9; Today take 2 tablets of warfarin then START taking Warfarin 1 tablet daily except 1.5 tablets on Mondays, Wednesday and Fridays.  Stay consistent with greens each week.  Recheck INR in 3 weeks. Coumadin  Clinic [phone]

## 2024-05-12 ENCOUNTER — Ambulatory Visit: Attending: Cardiology | Admitting: *Deleted

## 2024-05-12 DIAGNOSIS — I823 Embolism and thrombosis of renal vein: Secondary | ICD-10-CM | POA: Diagnosis present

## 2024-05-12 DIAGNOSIS — Z7901 Long term (current) use of anticoagulants: Secondary | ICD-10-CM | POA: Diagnosis present

## 2024-05-12 DIAGNOSIS — Z5181 Encounter for therapeutic drug level monitoring: Secondary | ICD-10-CM | POA: Insufficient documentation

## 2024-05-12 DIAGNOSIS — I639 Cerebral infarction, unspecified: Secondary | ICD-10-CM | POA: Diagnosis not present

## 2024-05-12 LAB — POCT INR: POC INR: 2.2

## 2024-05-12 NOTE — Patient Instructions (Signed)
 Description   INR 2.2; Continue taking Warfarin 1 tablet daily except 1.5 tablets on Mondays, Wednesday and Fridays.  Stay consistent with greens each week.  Recheck INR in 4 weeks. Coumadin  Clinic [phone]

## 2024-05-12 NOTE — Progress Notes (Signed)
 Lab Results  Component Value Date   INR 2.2 05/12/2024   INR 1.9 04/21/2024   INR 2.0 03/10/2024    Description   INR 2.2; Continue taking Warfarin 1 tablet daily except 1.5 tablets on Mondays, Wednesday and Fridays.  Stay consistent with greens each week.  Recheck INR in 4 weeks. Coumadin  Clinic [phone]

## 2024-05-13 ENCOUNTER — Other Ambulatory Visit: Payer: Self-pay | Admitting: Family Medicine

## 2024-05-23 ENCOUNTER — Ambulatory Visit (INDEPENDENT_AMBULATORY_CARE_PROVIDER_SITE_OTHER): Admitting: Otolaryngology

## 2024-05-23 ENCOUNTER — Encounter (INDEPENDENT_AMBULATORY_CARE_PROVIDER_SITE_OTHER): Payer: Self-pay | Admitting: Otolaryngology

## 2024-05-23 VITALS — BP 124/81 | HR 90 | Ht 67.0 in | Wt 165.0 lb

## 2024-05-23 DIAGNOSIS — R0981 Nasal congestion: Secondary | ICD-10-CM | POA: Diagnosis not present

## 2024-05-23 DIAGNOSIS — J3489 Other specified disorders of nose and nasal sinuses: Secondary | ICD-10-CM | POA: Diagnosis not present

## 2024-05-23 DIAGNOSIS — J310 Chronic rhinitis: Secondary | ICD-10-CM | POA: Diagnosis not present

## 2024-05-23 DIAGNOSIS — J320 Chronic maxillary sinusitis: Secondary | ICD-10-CM

## 2024-05-23 DIAGNOSIS — J322 Chronic ethmoidal sinusitis: Secondary | ICD-10-CM

## 2024-05-23 DIAGNOSIS — J338 Other polyp of sinus: Secondary | ICD-10-CM

## 2024-05-29 ENCOUNTER — Telehealth (INDEPENDENT_AMBULATORY_CARE_PROVIDER_SITE_OTHER): Payer: Self-pay | Admitting: Otolaryngology

## 2024-05-29 NOTE — Telephone Encounter (Signed)
"   Per Dr. Karis, prescription nasal rinse ( Mupirocin  20 mg/Budesonide 0.5 mg/ Amphotericin B 5 mg capsules, was faxed to [facility] with 10 refill.  "

## 2024-06-09 ENCOUNTER — Ambulatory Visit

## 2024-06-09 ENCOUNTER — Inpatient Hospital Stay (HOSPITAL_COMMUNITY): Admission: RE | Admit: 2024-06-09 | Source: Ambulatory Visit

## 2024-06-10 ENCOUNTER — Ambulatory Visit

## 2024-11-07 ENCOUNTER — Ambulatory Visit (INDEPENDENT_AMBULATORY_CARE_PROVIDER_SITE_OTHER): Admitting: Otolaryngology
